# Patient Record
Sex: Female | Born: 1945 | Race: White | Hispanic: No | Smoking: Former smoker
Health system: Southern US, Community
[De-identification: ages and names within clinical notes are randomized; demographics above are authoritative.]

## PROBLEM LIST (undated history)

## (undated) DIAGNOSIS — F419 Anxiety disorder, unspecified: Secondary | ICD-10-CM

## (undated) DIAGNOSIS — R0902 Hypoxemia: Secondary | ICD-10-CM

## (undated) DIAGNOSIS — E119 Type 2 diabetes mellitus without complications: Secondary | ICD-10-CM

## (undated) DIAGNOSIS — J449 Chronic obstructive pulmonary disease, unspecified: Secondary | ICD-10-CM

## (undated) DIAGNOSIS — G4730 Sleep apnea, unspecified: Secondary | ICD-10-CM

## (undated) DIAGNOSIS — H269 Unspecified cataract: Secondary | ICD-10-CM

## (undated) DIAGNOSIS — F329 Major depressive disorder, single episode, unspecified: Secondary | ICD-10-CM

## (undated) DIAGNOSIS — I483 Typical atrial flutter: Secondary | ICD-10-CM

## (undated) DIAGNOSIS — I4891 Unspecified atrial fibrillation: Secondary | ICD-10-CM

## (undated) DIAGNOSIS — F32A Depression, unspecified: Secondary | ICD-10-CM

## (undated) DIAGNOSIS — M1990 Unspecified osteoarthritis, unspecified site: Secondary | ICD-10-CM

## (undated) DIAGNOSIS — I10 Essential (primary) hypertension: Secondary | ICD-10-CM

## (undated) DIAGNOSIS — E785 Hyperlipidemia, unspecified: Secondary | ICD-10-CM

## (undated) DIAGNOSIS — D649 Anemia, unspecified: Secondary | ICD-10-CM

## (undated) DIAGNOSIS — J439 Emphysema, unspecified: Secondary | ICD-10-CM

## (undated) DIAGNOSIS — T7840XA Allergy, unspecified, initial encounter: Secondary | ICD-10-CM

## (undated) HISTORY — DX: Anxiety disorder, unspecified: F41.9

## (undated) HISTORY — DX: Major depressive disorder, single episode, unspecified: F32.9

## (undated) HISTORY — DX: Typical atrial flutter: I48.3

## (undated) HISTORY — DX: Hypoxemia: R09.02

## (undated) HISTORY — PX: DILATION AND CURETTAGE OF UTERUS: SHX78

## (undated) HISTORY — PX: FOOT NEUROMA SURGERY: SHX646

## (undated) HISTORY — DX: Hyperlipidemia, unspecified: E78.5

## (undated) HISTORY — PX: CHOLECYSTECTOMY: SHX55

## (undated) HISTORY — PX: CARPAL TUNNEL RELEASE: SHX101

## (undated) HISTORY — DX: Chronic obstructive pulmonary disease, unspecified: J44.9

## (undated) HISTORY — DX: Emphysema, unspecified: J43.9

## (undated) HISTORY — PX: ABDOMINAL HYSTERECTOMY: SHX81

## (undated) HISTORY — DX: Unspecified osteoarthritis, unspecified site: M19.90

## (undated) HISTORY — PX: TONSILLECTOMY: SUR1361

## (undated) HISTORY — DX: Allergy, unspecified, initial encounter: T78.40XA

## (undated) HISTORY — PX: MOUTH SURGERY: SHX715

## (undated) HISTORY — DX: Unspecified cataract: H26.9

## (undated) HISTORY — DX: Anemia, unspecified: D64.9

## (undated) HISTORY — DX: Sleep apnea, unspecified: G47.30

## (undated) HISTORY — DX: Unspecified atrial fibrillation: I48.91

## (undated) HISTORY — DX: Depression, unspecified: F32.A

---

## 1988-09-25 HISTORY — PX: OTHER SURGICAL HISTORY: SHX169

## 2008-10-16 ENCOUNTER — Encounter: Admission: RE | Admit: 2008-10-16 | Discharge: 2008-10-16 | Payer: Self-pay | Admitting: Family Medicine

## 2012-03-06 DIAGNOSIS — R0602 Shortness of breath: Secondary | ICD-10-CM

## 2012-03-26 ENCOUNTER — Other Ambulatory Visit: Payer: Self-pay | Admitting: Family Medicine

## 2012-03-26 DIAGNOSIS — R921 Mammographic calcification found on diagnostic imaging of breast: Secondary | ICD-10-CM

## 2012-04-05 ENCOUNTER — Ambulatory Visit
Admission: RE | Admit: 2012-04-05 | Discharge: 2012-04-05 | Disposition: A | Discharge status: Home / Self Care | Source: Ambulatory Visit | Attending: Family Medicine | Admitting: Family Medicine

## 2012-04-05 DIAGNOSIS — R921 Mammographic calcification found on diagnostic imaging of breast: Secondary | ICD-10-CM

## 2013-06-09 ENCOUNTER — Encounter (HOSPITAL_COMMUNITY): Payer: Self-pay

## 2013-06-09 ENCOUNTER — Encounter (HOSPITAL_COMMUNITY)
Admission: RE | Admit: 2013-06-09 | Discharge: 2013-06-09 | Disposition: A | Discharge status: Home / Self Care | Payer: Medicare Other | Source: Ambulatory Visit | Attending: Ophthalmology | Admitting: Ophthalmology

## 2013-06-09 ENCOUNTER — Encounter (HOSPITAL_COMMUNITY): Payer: Self-pay | Admitting: Pharmacy Technician

## 2013-06-09 ENCOUNTER — Other Ambulatory Visit: Payer: Self-pay

## 2013-06-09 DIAGNOSIS — Z01812 Encounter for preprocedural laboratory examination: Secondary | ICD-10-CM | POA: Insufficient documentation

## 2013-06-09 DIAGNOSIS — Z01818 Encounter for other preprocedural examination: Secondary | ICD-10-CM | POA: Insufficient documentation

## 2013-06-09 DIAGNOSIS — Z01810 Encounter for preprocedural cardiovascular examination: Secondary | ICD-10-CM | POA: Insufficient documentation

## 2013-06-09 HISTORY — DX: Type 2 diabetes mellitus without complications: E11.9

## 2013-06-09 HISTORY — DX: Essential (primary) hypertension: I10

## 2013-06-09 LAB — HEMOGLOBIN AND HEMATOCRIT, BLOOD: Hemoglobin: 13.6 g/dL (ref 12.0–15.0)

## 2013-06-09 LAB — BASIC METABOLIC PANEL
BUN: 16 mg/dL (ref 6–23)
Chloride: 100 mEq/L (ref 96–112)
GFR calc Af Amer: 70 mL/min — ABNORMAL LOW (ref >90)
Potassium: 4.3 mEq/L (ref 3.5–5.1)

## 2013-06-09 NOTE — Patient Instructions (Addendum)
Your procedure is scheduled on:  06/16/2013  Report to [facility] at    11:00  AM.  Call this number if you have problems the morning of surgery: [phone]   Remember:   Do not eat or drink :After Midnight.    Take these medicines the morning of surgery with A SIP OF WATER: Benicar    Do not wear jewelry, make-up or nail polish.  Do not wear lotions, powders, or perfumes. You may wear deodorant.  Do not shave 48 hours prior to surgery.  Do not bring valuables to the hospital.  Contacts, dentures or bridgework may not be worn into surgery.  Patients discharged the day of surgery will not be allowed to drive home.  Name and phone number of your driver:    Please read over the following fact sheets that you were given: Pain Booklet, Surgical Site Infection Prevention, Anesthesia Post-op Instructions and Care and Recovery After Surgery  Cataract Surgery  A cataract is a clouding of the lens of the eye. When a lens becomes cloudy, vision is reduced based on the degree and nature of the clouding. Surgery may be needed to improve vision. Surgery removes the cloudy lens and usually replaces it with a substitute lens (intraocular lens, IOL). LET YOUR EYE DOCTOR KNOW ABOUT:  Allergies to food or medicine.   Medicines taken including herbs, eyedrops, over-the-counter medicines, and creams.   Use of steroids (by mouth or creams).   Previous problems with anesthetics or numbing medicine.   History of bleeding problems or blood clots.   Previous surgery.   Other health problems, including diabetes and kidney problems.   Possibility of pregnancy, if this applies.  RISKS AND COMPLICATIONS  Infection.   Inflammation of the eyeball (endophthalmitis) that can spread to both eyes (sympathetic ophthalmia).   Poor wound healing.   If an IOL is inserted, it can later fall out of proper position. This is very uncommon.   Clouding of the part of your eye that holds an IOL in place. This is  called an "after-cataract." These are uncommon, but easily treated.  BEFORE THE PROCEDURE  Do not eat or drink anything except small amounts of water for 8 to 12 before your surgery, or as directed by your caregiver.   Unless you are told otherwise, continue any eyedrops you have been prescribed.   Talk to your primary caregiver about all other medicines that you take (both prescription and non-prescription). In some cases, you may need to stop or change medicines near the time of your surgery. This is most important if you are taking blood-thinning medicine.Do not stop medicines unless you are told to do so.   Arrange for someone to drive you to and from the procedure.   Do not put contact lenses in either eye on the day of your surgery.  PROCEDURE There is more than one method for safely removing a cataract. Your doctor can explain the differences and help determine which is best for you. Phacoemulsification surgery is the most common form of cataract surgery.  An injection is given behind the eye or eyedrops are given to make this a painless procedure.   A small cut (incision) is made on the edge of the clear, dome-shaped surface that covers the front of the eye (cornea).   A tiny probe is painlessly inserted into the eye. This device gives off ultrasound waves that soften and break up the cloudy center of the lens. This makes it easier  for the cloudy lens to be removed by suction.   An IOL may be implanted.   The normal lens of the eye is covered by a clear capsule. Part of that capsule is intentionally left in the eye to support the IOL.   Your surgeon may or may not use stitches to close the incision.  There are other forms of cataract surgery that require a larger incision and stiches to close the eye. This approach is taken in cases where the doctor feels that the cataract cannot be easily removed using phacoemulsification. AFTER THE PROCEDURE  When an IOL is implanted, it  does not need care. It becomes a permanent part of your eye and cannot be seen or felt.   Your doctor will schedule follow-up exams to check on your progress.   Review your other medicines with your doctor to see which can be resumed after surgery.   Use eyedrops or take medicine as prescribed by your doctor.  Document Released: 08/31/2011 Document Reviewed: 08/28/2011 [facility] Patient Information 2012 [place].  .Cataract Surgery Care After Refer to this sheet in the next few weeks. These instructions provide you with information on caring for yourself after your procedure. Your caregiver may also give you more specific instructions. Your treatment has been planned according to current medical practices, but problems sometimes occur. Call your caregiver if you have any problems or questions after your procedure.  HOME CARE INSTRUCTIONS   Avoid strenuous activities as directed by your caregiver.   Ask your caregiver when you can resume driving.   Use eyedrops or other medicines to help healing and control pressure inside your eye as directed by your caregiver.   Only take over-the-counter or prescription medicines for pain, discomfort, or fever as directed by your caregiver.   Do not to touch or rub your eyes.   You may be instructed to use a protective shield during the first few days and nights after surgery. If not, wear sunglasses to protect your eyes. This is to protect the eye from pressure or from being accidentally bumped.   Keep the area around your eye clean and dry. Avoid swimming or allowing water to hit you directly in the face while showering. Keep soap and shampoo out of your eyes.   Do not bend or lift heavy objects. Bending increases pressure in the eye. You can walk, climb stairs, and do light household chores.   Do not put a contact lens into the eye that had surgery until your caregiver says it is okay to do so.   Ask your doctor when you can return to  work. This will depend on the kind of work that you do. If you work in a dusty environment, you may be advised to wear protective eyewear for a period of time.   Ask your caregiver when it will be safe to engage in sexual activity.   Continue with your regular eye exams as directed by your caregiver.  What to expect:  It is normal to feel itching and mild discomfort for a few days after cataract surgery. Some fluid discharge is also common, and your eye may be sensitive to light and touch.   After 1 to 2 days, even moderate discomfort should disappear. In most cases, healing will take about 6 weeks.   If you received an intraocular lens (IOL), you may notice that colors are very bright or have a blue tinge. Also, if you have been in bright sunlight, everything may  appear reddish for a few hours. If you see these color tinges, it is because your lens is clear and no longer cloudy. Within a few months after receiving an IOL, these extra colors should go away. When you have healed, you will probably need new glasses.  SEEK MEDICAL CARE IF:   You have increased bruising around your eye.   You have discomfort not helped by medicine.  SEEK IMMEDIATE MEDICAL CARE IF:   You have a fever.   You have a worsening or sudden vision loss.   You have redness, swelling, or increasing pain in the eye.   You have a thick discharge from the eye that had surgery.  MAKE SURE YOU:  Understand these instructions.   Will watch your condition.   Will get help right away if you are not doing well or get worse.  Document Released: 03/31/2005 Document Revised: 08/31/2011 Document Reviewed: 05/05/2011 [facility] Patient Information 2012 [place].

## 2013-06-13 MED ORDER — TETRACAINE HCL 0.5 % OP SOLN
OPHTHALMIC | Status: AC
  Filled 2013-06-13: qty 2

## 2013-06-13 MED ORDER — CYCLOPENTOLATE-PHENYLEPHRINE OP SOLN OPTIME - NO CHARGE
OPHTHALMIC | Status: AC
  Filled 2013-06-13: qty 2

## 2013-06-13 MED ORDER — LIDOCAINE HCL 3.5 % OP GEL
OPHTHALMIC | Status: AC
  Filled 2013-06-13: qty 5

## 2013-06-13 MED ORDER — NEOMYCIN-POLYMYXIN-DEXAMETH 3.5-10000-0.1 OP OINT
TOPICAL_OINTMENT | OPHTHALMIC | Status: AC
  Filled 2013-06-13: qty 3.5

## 2013-06-16 ENCOUNTER — Ambulatory Visit (HOSPITAL_COMMUNITY): Payer: Medicare Other | Admitting: Anesthesiology

## 2013-06-16 ENCOUNTER — Encounter (HOSPITAL_COMMUNITY): Payer: Self-pay | Admitting: *Deleted

## 2013-06-16 ENCOUNTER — Encounter (HOSPITAL_COMMUNITY): Payer: Self-pay | Admitting: Anesthesiology

## 2013-06-16 ENCOUNTER — Encounter (HOSPITAL_COMMUNITY)
Admission: RE | Disposition: A | Discharge status: Home / Self Care | Payer: Self-pay | Source: Ambulatory Visit | Attending: Ophthalmology

## 2013-06-16 ENCOUNTER — Ambulatory Visit (HOSPITAL_COMMUNITY)
Admission: RE | Admit: 2013-06-16 | Discharge: 2013-06-16 | Disposition: A | Discharge status: Home / Self Care | Payer: Medicare Other | Source: Ambulatory Visit | Attending: Ophthalmology | Admitting: Ophthalmology

## 2013-06-16 DIAGNOSIS — I10 Essential (primary) hypertension: Secondary | ICD-10-CM | POA: Insufficient documentation

## 2013-06-16 DIAGNOSIS — E119 Type 2 diabetes mellitus without complications: Secondary | ICD-10-CM | POA: Insufficient documentation

## 2013-06-16 DIAGNOSIS — H2510 Age-related nuclear cataract, unspecified eye: Secondary | ICD-10-CM | POA: Insufficient documentation

## 2013-06-16 DIAGNOSIS — Z01812 Encounter for preprocedural laboratory examination: Secondary | ICD-10-CM | POA: Insufficient documentation

## 2013-06-16 HISTORY — PX: CATARACT EXTRACTION W/PHACO: SHX586

## 2013-06-16 LAB — GLUCOSE, CAPILLARY: Glucose-Capillary: 89 mg/dL (ref 70–99)

## 2013-06-16 SURGERY — PHACOEMULSIFICATION, CATARACT, WITH IOL INSERTION
Anesthesia: Monitor Anesthesia Care | Site: Eye | Laterality: Left | Wound class: Clean

## 2013-06-16 MED ORDER — FENTANYL CITRATE 0.05 MG/ML IJ SOLN
INTRAMUSCULAR | Status: AC
  Filled 2013-06-16: qty 2

## 2013-06-16 MED ORDER — MIDAZOLAM HCL 2 MG/2ML IJ SOLN
1.0000 mg | INTRAMUSCULAR | Status: DC | PRN
  Administered 2013-06-16: 2 mg via INTRAVENOUS

## 2013-06-16 MED ORDER — MIDAZOLAM HCL 2 MG/2ML IJ SOLN
INTRAMUSCULAR | Status: AC
  Filled 2013-06-16: qty 2

## 2013-06-16 MED ORDER — LIDOCAINE HCL (PF) 1 % IJ SOLN
INTRAMUSCULAR | Status: DC | PRN
  Administered 2013-06-16: .7 mL

## 2013-06-16 MED ORDER — LIDOCAINE 3.5 % OP GEL OPTIME - NO CHARGE
OPHTHALMIC | Status: DC | PRN
  Administered 2013-06-16: 2 [drp] via OPHTHALMIC

## 2013-06-16 MED ORDER — FENTANYL CITRATE 0.05 MG/ML IJ SOLN: 25.0000 ug | INTRAMUSCULAR | Status: DC | PRN

## 2013-06-16 MED ORDER — PROVISC 10 MG/ML IO SOLN
INTRAOCULAR | Status: DC | PRN
  Administered 2013-06-16: 8.5 mg via INTRAOCULAR

## 2013-06-16 MED ORDER — FENTANYL CITRATE 0.05 MG/ML IJ SOLN
25.0000 ug | INTRAMUSCULAR | Status: AC
  Administered 2013-06-16: 25 ug via INTRAVENOUS

## 2013-06-16 MED ORDER — ONDANSETRON HCL 4 MG/2ML IJ SOLN: 4.0000 mg | Freq: Once | INTRAMUSCULAR | Status: AC | PRN

## 2013-06-16 MED ORDER — BSS IO SOLN
INTRAOCULAR | Status: DC | PRN
  Administered 2013-06-16: 15 mL via INTRAOCULAR

## 2013-06-16 MED ORDER — LIDOCAINE HCL 3.5 % OP GEL
1.0000 "application " | Freq: Once | OPHTHALMIC | Status: AC
  Administered 2013-06-16: 1 via OPHTHALMIC

## 2013-06-16 MED ORDER — POVIDONE-IODINE 5 % OP SOLN
OPHTHALMIC | Status: DC | PRN
  Administered 2013-06-16: 1 via OPHTHALMIC

## 2013-06-16 MED ORDER — NEOMYCIN-POLYMYXIN-DEXAMETH 0.1 % OP OINT
TOPICAL_OINTMENT | OPHTHALMIC | Status: DC | PRN
  Administered 2013-06-16: 1 via OPHTHALMIC

## 2013-06-16 MED ORDER — TETRACAINE HCL 0.5 % OP SOLN
1.0000 [drp] | OPHTHALMIC | Status: AC
  Administered 2013-06-16 (×3): 1 [drp] via OPHTHALMIC

## 2013-06-16 MED ORDER — EPINEPHRINE HCL 1 MG/ML IJ SOLN
INTRAOCULAR | Status: DC | PRN
  Administered 2013-06-16: 13:00:00

## 2013-06-16 MED ORDER — CYCLOPENTOLATE-PHENYLEPHRINE 0.2-1 % OP SOLN
1.0000 [drp] | OPHTHALMIC | Status: AC
  Administered 2013-06-16 (×3): 1 [drp] via OPHTHALMIC

## 2013-06-16 MED ORDER — LACTATED RINGERS IV SOLN
INTRAVENOUS | Status: DC | PRN
  Administered 2013-06-16: 13:00:00 via INTRAVENOUS

## 2013-06-16 MED ORDER — LACTATED RINGERS IV SOLN
INTRAVENOUS | Status: DC
  Administered 2013-06-16: 1000 mL via INTRAVENOUS

## 2013-06-16 MED ORDER — PHENYLEPHRINE HCL 2.5 % OP SOLN
1.0000 [drp] | OPHTHALMIC | Status: AC
  Administered 2013-06-16 (×3): 1 [drp] via OPHTHALMIC

## 2013-06-16 MED ORDER — EPINEPHRINE HCL 1 MG/ML IJ SOLN
INTRAMUSCULAR | Status: AC
  Filled 2013-06-16: qty 1

## 2013-06-16 SURGICAL SUPPLY — 33 items
CAPSULAR TENSION RING-AMO (OPHTHALMIC RELATED) IMPLANT
CLOTH BEACON ORANGE TIMEOUT ST (SAFETY) ×2 IMPLANT
EYE SHIELD UNIVERSAL CLEAR (GAUZE/BANDAGES/DRESSINGS) ×2 IMPLANT
GLOVE BIO SURGEON STRL SZ 6.5 (GLOVE) IMPLANT
GLOVE BIOGEL PI IND STRL 6.5 (GLOVE) ×1 IMPLANT
GLOVE BIOGEL PI IND STRL 7.0 (GLOVE) IMPLANT
GLOVE BIOGEL PI IND STRL 7.5 (GLOVE) IMPLANT
GLOVE BIOGEL PI INDICATOR 6.5 (GLOVE) ×1
GLOVE BIOGEL PI INDICATOR 7.0 (GLOVE)
GLOVE BIOGEL PI INDICATOR 7.5 (GLOVE)
GLOVE ECLIPSE 6.5 STRL STRAW (GLOVE) IMPLANT
GLOVE ECLIPSE 7.0 STRL STRAW (GLOVE) IMPLANT
GLOVE ECLIPSE 7.5 STRL STRAW (GLOVE) IMPLANT
GLOVE EXAM NITRILE LRG STRL (GLOVE) ×2 IMPLANT
GLOVE EXAM NITRILE MD LF STRL (GLOVE) IMPLANT
GLOVE SKINSENSE NS SZ6.5 (GLOVE)
GLOVE SKINSENSE NS SZ7.0 (GLOVE)
GLOVE SKINSENSE STRL SZ6.5 (GLOVE) IMPLANT
GLOVE SKINSENSE STRL SZ7.0 (GLOVE) IMPLANT
KIT VITRECTOMY (OPHTHALMIC RELATED) IMPLANT
PAD ARMBOARD 7.5X6 YLW CONV (MISCELLANEOUS) ×2 IMPLANT
PROC W NO LENS (INTRAOCULAR LENS)
PROC W SPEC LENS (INTRAOCULAR LENS)
PROCESS W NO LENS (INTRAOCULAR LENS) IMPLANT
PROCESS W SPEC LENS (INTRAOCULAR LENS) IMPLANT
RING MALYGIN (MISCELLANEOUS) IMPLANT
SIGHTPATH CAT PROC W REG LENS (Ophthalmic Related) ×2 IMPLANT
SYR TB 1ML LL NO SAFETY (SYRINGE) ×2 IMPLANT
TAPE PAPER MEDFIX 1IN X 10YD (GAUZE/BANDAGES/DRESSINGS) ×2 IMPLANT
TAPE SURG TRANSPORE 1 IN (GAUZE/BANDAGES/DRESSINGS) ×1 IMPLANT
TAPE SURGICAL TRANSPORE 1 IN (GAUZE/BANDAGES/DRESSINGS) ×1
VISCOELASTIC ADDITIONAL (OPHTHALMIC RELATED) IMPLANT
WATER STERILE IRR 250ML POUR (IV SOLUTION) ×2 IMPLANT

## 2013-06-16 NOTE — H&P (Signed)
I have reviewed the H&P, the patient was re-examined, and I have identified no interval changes in medical condition and plan of care since the history and physical of record  

## 2013-06-16 NOTE — Anesthesia Procedure Notes (Signed)
Procedure Name: MAC Date/Time: 06/16/2013 1:19 PM Performed by: Antony Contras, AMY L Pre-anesthesia Checklist: Patient identified, Timeout performed, Emergency Drugs available, Suction available and Patient being monitored Oxygen Delivery Method: Nasal cannula

## 2013-06-16 NOTE — Op Note (Signed)
Date of Admission: 06/16/2013  Date of Surgery: 06/16/2013  Pre-Op Dx: Cataract  Left  Eye  Post-Op Dx: Nuclear Cataract  Left  Eye,  Dx Code 366.16  Surgeon: Tonny Branch, M.D.  Assistants: None  Anesthesia: Topical with MAC  Indications: Painless, progressive loss of vision with compromise of daily activities.  Surgery: Cataract Extraction with Intraocular lens Implant Left Eye  Discription: The patient had dilating drops and viscous lidocaine placed into the left eye in the pre-op holding area. After transfer to the operating room, a time out was performed. The patient was then prepped and draped. Beginning with a 50 degree blade a paracentesis port was made at the surgeon's 2 o'clock position. The anterior chamber was then filled with 1% non-preserved lidocaine. This was followed by filling the anterior chamber with Provisc. A 2.38mm keratome blade was used to make a clear corneal incision at the temporal limbus. A bent cystatome needle was used to create a continuous tear capsulotomy. Hydrodissection was performed with balanced salt solution on a Fine canula. The lens nucleus was then removed using the phacoemulsification handpiece. Residual cortex was removed with the I&A handpiece. The anterior chamber and capsular bag were refilled with Provisc. A posterior chamber intraocular lens was placed into the capsular bag with it's injector. The implant was positioned with the Kuglan hook. The Provisc was then removed from the anterior chamber and capsular bag with the I&A handpiece. Stromal hydration of the main incision and paracentesis port was performed with BSS on a Fine canula. The wounds were tested for leak which was negative. The patient tolerated the procedure well. There were no operative complications. The patient was then transferred to the recovery room in stable condition.  Complications: None  Specimen: None  EBL: None  Prosthetic device: B&L enVista, MX60, power 22.0D, SN  [id number].

## 2013-06-16 NOTE — Anesthesia Postprocedure Evaluation (Signed)
  Anesthesia Post-op Note  Patient: Linda [name]  Procedure(s) Performed: Procedure(s) with comments: CATARACT EXTRACTION PHACO AND INTRAOCULAR LENS PLACEMENT (IOC) (Left) - CDE:  12.18  Patient Location: Short Stay  Anesthesia Type:MAC  Level of Consciousness: awake, alert , oriented and patient cooperative  Airway and Oxygen Therapy: Patient Spontanous Breathing  Post-op Pain: none  Post-op Assessment: Post-op Vital signs reviewed, Patient's Cardiovascular Status Stable, Respiratory Function Stable, Patent Airway, No signs of Nausea or vomiting and Pain level controlled  Post-op Vital Signs: Reviewed and stable  Complications: No apparent anesthesia complications

## 2013-06-16 NOTE — Anesthesia Preprocedure Evaluation (Addendum)
Anesthesia Evaluation  Patient identified by MRN, date of birth, ID band Patient awake    Reviewed: Allergy & Precautions, H&P , NPO status , Patient's Chart, lab work & pertinent test results  Airway Mallampati: II      Dental  (+) Teeth Intact   Pulmonary  breath sounds clear to auscultation        Cardiovascular hypertension, Pt. on medications Rhythm:Regular Rate:Normal     Neuro/Psych    GI/Hepatic   Endo/Other  diabetes, Type 2, Oral Hypoglycemic Agents  Renal/GU      Musculoskeletal   Abdominal   Peds  Hematology   Anesthesia Other Findings   Reproductive/Obstetrics                           Anesthesia Physical Anesthesia Plan  ASA: III  Anesthesia Plan: MAC   Post-op Pain Management:    Induction: Intravenous  Airway Management Planned: Nasal Cannula  Additional Equipment:   Intra-op Plan:   Post-operative Plan:   Informed Consent: I have reviewed the patients History and Physical, chart, labs and discussed the procedure including the risks, benefits and alternatives for the proposed anesthesia with the patient or authorized representative who has indicated his/her understanding and acceptance.     Plan Discussed with:   Anesthesia Plan Comments:         Anesthesia Quick Evaluation

## 2013-06-16 NOTE — Preoperative (Signed)
Beta Blockers   Reason not to administer Beta Blockers:Not Applicable 

## 2013-06-16 NOTE — Transfer of Care (Signed)
Immediate Anesthesia Transfer of Care Note  Patient: Linda [name]  Procedure(s) Performed: Procedure(s) with comments: CATARACT EXTRACTION PHACO AND INTRAOCULAR LENS PLACEMENT (IOC) (Left) - CDE:  12.18  Patient Location: Short Stay  Anesthesia Type:MAC  Level of Consciousness: awake, alert , oriented and patient cooperative  Airway & Oxygen Therapy: Patient Spontanous Breathing  Post-op Assessment: Report given to PACU RN and Post -op Vital signs reviewed and stable  Post vital signs: Reviewed and stable  Complications: No apparent anesthesia complications

## 2013-06-17 ENCOUNTER — Encounter (HOSPITAL_COMMUNITY): Payer: Self-pay | Admitting: Ophthalmology

## 2013-06-24 ENCOUNTER — Encounter (HOSPITAL_COMMUNITY): Payer: Self-pay | Admitting: Pharmacy Technician

## 2013-06-25 ENCOUNTER — Encounter (HOSPITAL_COMMUNITY)
Admission: RE | Admit: 2013-06-25 | Discharge: 2013-06-25 | Disposition: A | Discharge status: Home / Self Care | Payer: Medicare Other | Source: Ambulatory Visit | Attending: Ophthalmology | Admitting: Ophthalmology

## 2013-06-25 MED ORDER — LIDOCAINE HCL (PF) 1 % IJ SOLN
INTRAMUSCULAR | Status: AC
  Filled 2013-06-25: qty 2

## 2013-06-25 MED ORDER — LIDOCAINE HCL 3.5 % OP GEL
OPHTHALMIC | Status: AC
  Filled 2013-06-25: qty 1

## 2013-06-25 MED ORDER — NEOMYCIN-POLYMYXIN-DEXAMETH 3.5-10000-0.1 OP SUSP
OPHTHALMIC | Status: AC
  Filled 2013-06-25: qty 5

## 2013-06-25 MED ORDER — CYCLOPENTOLATE-PHENYLEPHRINE OP SOLN OPTIME - NO CHARGE
OPHTHALMIC | Status: AC
  Filled 2013-06-25: qty 2

## 2013-06-25 MED ORDER — TETRACAINE HCL 0.5 % OP SOLN
OPHTHALMIC | Status: AC
  Filled 2013-06-25: qty 2

## 2013-06-26 ENCOUNTER — Ambulatory Visit (HOSPITAL_COMMUNITY): Payer: Medicare Other | Admitting: Anesthesiology

## 2013-06-26 ENCOUNTER — Ambulatory Visit (HOSPITAL_COMMUNITY)
Admission: RE | Admit: 2013-06-26 | Discharge: 2013-06-26 | Disposition: A | Discharge status: Home / Self Care | Payer: Medicare Other | Source: Ambulatory Visit | Attending: Ophthalmology | Admitting: Ophthalmology

## 2013-06-26 ENCOUNTER — Encounter (HOSPITAL_COMMUNITY)
Admission: RE | Disposition: A | Discharge status: Home / Self Care | Payer: Self-pay | Source: Ambulatory Visit | Attending: Ophthalmology

## 2013-06-26 ENCOUNTER — Encounter (HOSPITAL_COMMUNITY): Payer: Self-pay | Admitting: *Deleted

## 2013-06-26 ENCOUNTER — Encounter (HOSPITAL_COMMUNITY): Payer: Self-pay | Admitting: Anesthesiology

## 2013-06-26 DIAGNOSIS — Z01812 Encounter for preprocedural laboratory examination: Secondary | ICD-10-CM | POA: Insufficient documentation

## 2013-06-26 DIAGNOSIS — I10 Essential (primary) hypertension: Secondary | ICD-10-CM | POA: Insufficient documentation

## 2013-06-26 DIAGNOSIS — H2510 Age-related nuclear cataract, unspecified eye: Secondary | ICD-10-CM | POA: Insufficient documentation

## 2013-06-26 DIAGNOSIS — E119 Type 2 diabetes mellitus without complications: Secondary | ICD-10-CM | POA: Insufficient documentation

## 2013-06-26 HISTORY — PX: CATARACT EXTRACTION W/PHACO: SHX586

## 2013-06-26 LAB — GLUCOSE, CAPILLARY: Glucose-Capillary: 99 mg/dL (ref 70–99)

## 2013-06-26 SURGERY — PHACOEMULSIFICATION, CATARACT, WITH IOL INSERTION
Anesthesia: Monitor Anesthesia Care | Site: Eye | Laterality: Right | Wound class: Clean

## 2013-06-26 MED ORDER — MIDAZOLAM HCL 2 MG/2ML IJ SOLN
INTRAMUSCULAR | Status: AC
  Filled 2013-06-26: qty 2

## 2013-06-26 MED ORDER — FENTANYL CITRATE 0.05 MG/ML IJ SOLN
25.0000 ug | INTRAMUSCULAR | Status: AC
  Administered 2013-06-26: 25 ug via INTRAVENOUS

## 2013-06-26 MED ORDER — EPINEPHRINE HCL 1 MG/ML IJ SOLN
INTRAMUSCULAR | Status: AC
  Filled 2013-06-26: qty 1

## 2013-06-26 MED ORDER — LIDOCAINE HCL 3.5 % OP GEL: 1.0000 "application " | Freq: Once | OPHTHALMIC | Status: DC

## 2013-06-26 MED ORDER — PROVISC 10 MG/ML IO SOLN
INTRAOCULAR | Status: DC | PRN
  Administered 2013-06-26: 8.5 mg via INTRAOCULAR

## 2013-06-26 MED ORDER — BSS IO SOLN
INTRAOCULAR | Status: DC | PRN
  Administered 2013-06-26: 15 mL via INTRAOCULAR

## 2013-06-26 MED ORDER — LACTATED RINGERS IV SOLN
INTRAVENOUS | Status: DC
  Administered 2013-06-26: 1000 mL via INTRAVENOUS

## 2013-06-26 MED ORDER — TETRACAINE HCL 0.5 % OP SOLN
1.0000 [drp] | OPHTHALMIC | Status: AC
  Administered 2013-06-26 (×3): 1 [drp] via OPHTHALMIC

## 2013-06-26 MED ORDER — CYCLOPENTOLATE-PHENYLEPHRINE 0.2-1 % OP SOLN
1.0000 [drp] | OPHTHALMIC | Status: AC
  Administered 2013-06-26 (×3): 1 [drp] via OPHTHALMIC

## 2013-06-26 MED ORDER — LIDOCAINE HCL (PF) 1 % IJ SOLN
INTRAMUSCULAR | Status: DC | PRN
  Administered 2013-06-26: .4 mL

## 2013-06-26 MED ORDER — POVIDONE-IODINE 5 % OP SOLN
OPHTHALMIC | Status: DC | PRN
  Administered 2013-06-26: 1 via OPHTHALMIC

## 2013-06-26 MED ORDER — FENTANYL CITRATE 0.05 MG/ML IJ SOLN
INTRAMUSCULAR | Status: AC
  Filled 2013-06-26: qty 2

## 2013-06-26 MED ORDER — LIDOCAINE 3.5 % OP GEL OPTIME - NO CHARGE
OPHTHALMIC | Status: DC | PRN
  Administered 2013-06-26: 1 [drp] via OPHTHALMIC

## 2013-06-26 MED ORDER — MIDAZOLAM HCL 2 MG/2ML IJ SOLN
1.0000 mg | INTRAMUSCULAR | Status: DC | PRN
  Administered 2013-06-26: 2 mg via INTRAVENOUS

## 2013-06-26 MED ORDER — PHENYLEPHRINE HCL 2.5 % OP SOLN
OPHTHALMIC | Status: AC
  Filled 2013-06-26: qty 15

## 2013-06-26 MED ORDER — LACTATED RINGERS IV SOLN
INTRAVENOUS | Status: DC | PRN
  Administered 2013-06-26: 10:00:00 via INTRAVENOUS

## 2013-06-26 MED ORDER — EPINEPHRINE HCL 1 MG/ML IJ SOLN
INTRAOCULAR | Status: DC | PRN
  Administered 2013-06-26: 10:00:00

## 2013-06-26 MED ORDER — NEOMYCIN-POLYMYXIN-DEXAMETH 0.1 % OP OINT
TOPICAL_OINTMENT | OPHTHALMIC | Status: DC | PRN
  Administered 2013-06-26: 1 via OPHTHALMIC

## 2013-06-26 MED ORDER — PHENYLEPHRINE HCL 2.5 % OP SOLN
1.0000 [drp] | OPHTHALMIC | Status: AC
  Administered 2013-06-26 (×3): 1 [drp] via OPHTHALMIC

## 2013-06-26 SURGICAL SUPPLY — 31 items
CAPSULAR TENSION RING-AMO (OPHTHALMIC RELATED) IMPLANT
CLOTH BEACON ORANGE TIMEOUT ST (SAFETY) ×2 IMPLANT
EYE SHIELD UNIVERSAL CLEAR (GAUZE/BANDAGES/DRESSINGS) ×2 IMPLANT
GLOVE BIO SURGEON STRL SZ 6.5 (GLOVE) IMPLANT
GLOVE BIOGEL PI IND STRL 6.5 (GLOVE) ×1 IMPLANT
GLOVE BIOGEL PI IND STRL 7.0 (GLOVE) IMPLANT
GLOVE BIOGEL PI IND STRL 7.5 (GLOVE) IMPLANT
GLOVE BIOGEL PI INDICATOR 6.5 (GLOVE) ×1
GLOVE BIOGEL PI INDICATOR 7.0 (GLOVE)
GLOVE BIOGEL PI INDICATOR 7.5 (GLOVE)
GLOVE ECLIPSE 6.5 STRL STRAW (GLOVE) IMPLANT
GLOVE ECLIPSE 7.0 STRL STRAW (GLOVE) IMPLANT
GLOVE ECLIPSE 7.5 STRL STRAW (GLOVE) IMPLANT
GLOVE EXAM NITRILE LRG STRL (GLOVE) ×2 IMPLANT
GLOVE EXAM NITRILE MD LF STRL (GLOVE) IMPLANT
GLOVE SKINSENSE NS SZ6.5 (GLOVE)
GLOVE SKINSENSE NS SZ7.0 (GLOVE)
GLOVE SKINSENSE STRL SZ6.5 (GLOVE) IMPLANT
GLOVE SKINSENSE STRL SZ7.0 (GLOVE) IMPLANT
KIT VITRECTOMY (OPHTHALMIC RELATED) IMPLANT
PAD ARMBOARD 7.5X6 YLW CONV (MISCELLANEOUS) ×2 IMPLANT
PROC W NO LENS (INTRAOCULAR LENS)
PROC W SPEC LENS (INTRAOCULAR LENS)
PROCESS W NO LENS (INTRAOCULAR LENS) IMPLANT
PROCESS W SPEC LENS (INTRAOCULAR LENS) IMPLANT
RING MALYGIN (MISCELLANEOUS) IMPLANT
SIGHTPATH CAT PROC W REG LENS (Ophthalmic Related) ×2 IMPLANT
SYR TB 1ML LL NO SAFETY (SYRINGE) ×2 IMPLANT
TAPE PAPER 2X10 WHT MICROPORE (GAUZE/BANDAGES/DRESSINGS) ×2 IMPLANT
VISCOELASTIC ADDITIONAL (OPHTHALMIC RELATED) IMPLANT
WATER STERILE IRR 250ML POUR (IV SOLUTION) ×2 IMPLANT

## 2013-06-26 NOTE — Transfer of Care (Signed)
Immediate Anesthesia Transfer of Care Note  Patient: Linda [name]  Procedure(s) Performed: Procedure(s) with comments: CATARACT EXTRACTION PHACO AND INTRAOCULAR LENS PLACEMENT (IOC) (Right) - CDE:14.71  Patient Location: Short Stay  Anesthesia Type:MAC  Level of Consciousness: awake, alert , oriented and patient cooperative  Airway & Oxygen Therapy: Patient Spontanous Breathing  Post-op Assessment: Report given to PACU RN and Post -op Vital signs reviewed and stable  Post vital signs: Reviewed and stable  Complications: No apparent anesthesia complications

## 2013-06-26 NOTE — Anesthesia Postprocedure Evaluation (Signed)
  Anesthesia Post-op Note  Patient: Linda [name]  Procedure(s) Performed: Procedure(s) with comments: CATARACT EXTRACTION PHACO AND INTRAOCULAR LENS PLACEMENT (IOC) (Right) - CDE:14.71  Patient Location: Short Stay  Anesthesia Type:MAC  Level of Consciousness: awake, alert , oriented and patient cooperative  Airway and Oxygen Therapy: Patient Spontanous Breathing  Post-op Pain: none  Post-op Assessment: Post-op Vital signs reviewed, Patient's Cardiovascular Status Stable, Respiratory Function Stable, Patent Airway, No signs of Nausea or vomiting and Pain level controlled  Post-op Vital Signs: Reviewed and stable  Complications: No apparent anesthesia complications

## 2013-06-26 NOTE — Anesthesia Procedure Notes (Signed)
Procedure Name: MAC Date/Time: 06/26/2013 10:16 AM Performed by: Antony Contras, Mariah Gerstenberger L Pre-anesthesia Checklist: Patient identified, Timeout performed, Emergency Drugs available, Suction available and Patient being monitored Patient Re-evaluated:Patient Re-evaluated prior to inductionOxygen Delivery Method: Nasal cannula

## 2013-06-26 NOTE — Preoperative (Signed)
Beta Blockers   Reason not to administer Beta Blockers:Not Applicable 

## 2013-06-26 NOTE — H&P (Signed)
I have reviewed the H&P, the patient was re-examined, and I have identified no interval changes in medical condition and plan of care since the history and physical of record  

## 2013-06-26 NOTE — Op Note (Signed)
Date of Admission: 06/26/2013  Date of Surgery: 06/26/2013  Pre-Op Dx: Cataract  Right  Eye  Post-Op Dx: Nuclear Cataract  Right  Eye,  Dx Code 366.16  Surgeon: Tonny Branch, M.D.  Assistants: None  Anesthesia: Topical with MAC  Indications: Painless, progressive loss of vision with compromise of daily activities.  Surgery: Cataract Extraction with Intraocular lens Implant Right Eye  Discription: The patient had dilating drops and viscous lidocaine placed into the right eye in the pre-op holding area. After transfer to the operating room, a time out was performed. The patient was then prepped and draped. Beginning with a 24 degree blade a paracentesis port was made at the surgeon's 2 o'clock position. The anterior chamber was then filled with 1% non-preserved lidocaine. This was followed by filling the anterior chamber with Provisc.  A 2.71mm keratome blade was used to make a clear corneal incision at the temporal limbus.  A bent cystatome needle was used to create a continuous tear capsulotomy. Hydrodissection was performed with balanced salt solution on a Fine canula. The lens nucleus was then removed using the phacoemulsification handpiece. Residual cortex was removed with the I&A handpiece. The anterior chamber and capsular bag were refilled with Provisc. A posterior chamber intraocular lens was placed into the capsular bag with it's injector. The implant was positioned with the Kuglan hook. The Provisc was then removed from the anterior chamber and capsular bag with the I&A handpiece. Stromal hydration of the main incision and paracentesis port was performed with BSS on a Fine canula. The wounds were tested for leak which was negative. The patient tolerated the procedure well. There were no operative complications. The patient was then transferred to the recovery room in stable condition.  Complications: None  Specimen: None  EBL: None  Prosthetic device: B&L enVista, MX60, power 22.5D, SN  [id number].

## 2013-06-26 NOTE — Anesthesia Preprocedure Evaluation (Signed)
Anesthesia Evaluation  Patient identified by MRN, date of birth, ID band Patient awake    Reviewed: Allergy & Precautions, H&P , NPO status , Patient's Chart, lab work & pertinent test results  Airway Mallampati: II      Dental  (+) Teeth Intact   Pulmonary  breath sounds clear to auscultation        Cardiovascular hypertension, Pt. on medications Rhythm:Regular Rate:Normal     Neuro/Psych    GI/Hepatic   Endo/Other  diabetes, Type 2, Oral Hypoglycemic Agents  Renal/GU      Musculoskeletal   Abdominal   Peds  Hematology   Anesthesia Other Findings   Reproductive/Obstetrics                           Anesthesia Physical Anesthesia Plan  ASA: III  Anesthesia Plan: MAC   Post-op Pain Management:    Induction: Intravenous  Airway Management Planned: Nasal Cannula  Additional Equipment:   Intra-op Plan:   Post-operative Plan:   Informed Consent: I have reviewed the patients History and Physical, chart, labs and discussed the procedure including the risks, benefits and alternatives for the proposed anesthesia with the patient or authorized representative who has indicated his/her understanding and acceptance.     Plan Discussed with:   Anesthesia Plan Comments:         Anesthesia Quick Evaluation

## 2013-06-30 ENCOUNTER — Encounter (HOSPITAL_COMMUNITY): Payer: Self-pay | Admitting: Ophthalmology

## 2016-06-05 LAB — HM MAMMOGRAPHY: HM MAMMO: NORMAL (ref 0–4)

## 2016-09-28 DIAGNOSIS — M79671 Pain in right foot: Secondary | ICD-10-CM | POA: Diagnosis not present

## 2016-09-28 DIAGNOSIS — B351 Tinea unguium: Secondary | ICD-10-CM | POA: Diagnosis not present

## 2016-09-28 DIAGNOSIS — M792 Neuralgia and neuritis, unspecified: Secondary | ICD-10-CM | POA: Diagnosis not present

## 2016-09-28 DIAGNOSIS — E1140 Type 2 diabetes mellitus with diabetic neuropathy, unspecified: Secondary | ICD-10-CM | POA: Diagnosis not present

## 2016-12-01 DIAGNOSIS — I10 Essential (primary) hypertension: Secondary | ICD-10-CM | POA: Diagnosis not present

## 2016-12-01 DIAGNOSIS — Z124 Encounter for screening for malignant neoplasm of cervix: Secondary | ICD-10-CM | POA: Diagnosis not present

## 2016-12-01 DIAGNOSIS — E559 Vitamin D deficiency, unspecified: Secondary | ICD-10-CM | POA: Diagnosis not present

## 2016-12-01 DIAGNOSIS — Z0000 Encounter for general adult medical examination without abnormal findings: Secondary | ICD-10-CM | POA: Diagnosis not present

## 2016-12-01 DIAGNOSIS — E1165 Type 2 diabetes mellitus with hyperglycemia: Secondary | ICD-10-CM | POA: Diagnosis not present

## 2016-12-01 DIAGNOSIS — Z6841 Body Mass Index (BMI) 40.0 and over, adult: Secondary | ICD-10-CM | POA: Diagnosis not present

## 2016-12-01 DIAGNOSIS — R296 Repeated falls: Secondary | ICD-10-CM | POA: Diagnosis not present

## 2016-12-01 DIAGNOSIS — E7801 Familial hypercholesterolemia: Secondary | ICD-10-CM | POA: Diagnosis not present

## 2016-12-01 DIAGNOSIS — Z1231 Encounter for screening mammogram for malignant neoplasm of breast: Secondary | ICD-10-CM | POA: Diagnosis not present

## 2016-12-01 DIAGNOSIS — Z1211 Encounter for screening for malignant neoplasm of colon: Secondary | ICD-10-CM | POA: Diagnosis not present

## 2016-12-07 DIAGNOSIS — M79671 Pain in right foot: Secondary | ICD-10-CM | POA: Diagnosis not present

## 2016-12-07 DIAGNOSIS — R269 Unspecified abnormalities of gait and mobility: Secondary | ICD-10-CM | POA: Diagnosis not present

## 2016-12-07 DIAGNOSIS — B351 Tinea unguium: Secondary | ICD-10-CM | POA: Diagnosis not present

## 2016-12-07 DIAGNOSIS — M6281 Muscle weakness (generalized): Secondary | ICD-10-CM | POA: Diagnosis not present

## 2016-12-07 DIAGNOSIS — M792 Neuralgia and neuritis, unspecified: Secondary | ICD-10-CM | POA: Diagnosis not present

## 2016-12-07 DIAGNOSIS — E1140 Type 2 diabetes mellitus with diabetic neuropathy, unspecified: Secondary | ICD-10-CM | POA: Diagnosis not present

## 2016-12-11 DIAGNOSIS — H35373 Puckering of macula, bilateral: Secondary | ICD-10-CM | POA: Diagnosis not present

## 2016-12-12 DIAGNOSIS — M6281 Muscle weakness (generalized): Secondary | ICD-10-CM | POA: Diagnosis not present

## 2016-12-12 DIAGNOSIS — R269 Unspecified abnormalities of gait and mobility: Secondary | ICD-10-CM | POA: Diagnosis not present

## 2016-12-15 DIAGNOSIS — M6281 Muscle weakness (generalized): Secondary | ICD-10-CM | POA: Diagnosis not present

## 2016-12-15 DIAGNOSIS — R269 Unspecified abnormalities of gait and mobility: Secondary | ICD-10-CM | POA: Diagnosis not present

## 2016-12-19 DIAGNOSIS — R269 Unspecified abnormalities of gait and mobility: Secondary | ICD-10-CM | POA: Diagnosis not present

## 2016-12-19 DIAGNOSIS — M6281 Muscle weakness (generalized): Secondary | ICD-10-CM | POA: Diagnosis not present

## 2016-12-21 DIAGNOSIS — R269 Unspecified abnormalities of gait and mobility: Secondary | ICD-10-CM | POA: Diagnosis not present

## 2016-12-21 DIAGNOSIS — M6281 Muscle weakness (generalized): Secondary | ICD-10-CM | POA: Diagnosis not present

## 2016-12-24 HISTORY — PX: EYE SURGERY: SHX253

## 2017-01-05 DIAGNOSIS — Z87891 Personal history of nicotine dependence: Secondary | ICD-10-CM | POA: Diagnosis not present

## 2017-01-05 DIAGNOSIS — I4891 Unspecified atrial fibrillation: Secondary | ICD-10-CM | POA: Diagnosis not present

## 2017-01-05 DIAGNOSIS — H35371 Puckering of macula, right eye: Secondary | ICD-10-CM | POA: Diagnosis not present

## 2017-01-05 DIAGNOSIS — I480 Paroxysmal atrial fibrillation: Secondary | ICD-10-CM | POA: Diagnosis not present

## 2017-01-05 DIAGNOSIS — R0902 Hypoxemia: Secondary | ICD-10-CM | POA: Diagnosis not present

## 2017-01-05 DIAGNOSIS — Z6841 Body Mass Index (BMI) 40.0 and over, adult: Secondary | ICD-10-CM | POA: Diagnosis not present

## 2017-01-05 DIAGNOSIS — J449 Chronic obstructive pulmonary disease, unspecified: Secondary | ICD-10-CM | POA: Diagnosis not present

## 2017-01-05 DIAGNOSIS — J9611 Chronic respiratory failure with hypoxia: Secondary | ICD-10-CM | POA: Diagnosis not present

## 2017-01-05 DIAGNOSIS — I272 Pulmonary hypertension, unspecified: Secondary | ICD-10-CM | POA: Diagnosis not present

## 2017-01-05 DIAGNOSIS — I499 Cardiac arrhythmia, unspecified: Secondary | ICD-10-CM | POA: Diagnosis not present

## 2017-01-06 DIAGNOSIS — Z7982 Long term (current) use of aspirin: Secondary | ICD-10-CM | POA: Diagnosis not present

## 2017-01-06 DIAGNOSIS — Z881 Allergy status to other antibiotic agents status: Secondary | ICD-10-CM | POA: Diagnosis not present

## 2017-01-06 DIAGNOSIS — Z880 Allergy status to penicillin: Secondary | ICD-10-CM | POA: Diagnosis not present

## 2017-01-06 DIAGNOSIS — R0902 Hypoxemia: Secondary | ICD-10-CM | POA: Diagnosis not present

## 2017-01-06 DIAGNOSIS — E119 Type 2 diabetes mellitus without complications: Secondary | ICD-10-CM | POA: Diagnosis not present

## 2017-01-06 DIAGNOSIS — G4733 Obstructive sleep apnea (adult) (pediatric): Secondary | ICD-10-CM | POA: Diagnosis present

## 2017-01-06 DIAGNOSIS — I10 Essential (primary) hypertension: Secondary | ICD-10-CM | POA: Diagnosis present

## 2017-01-06 DIAGNOSIS — Z882 Allergy status to sulfonamides status: Secondary | ICD-10-CM | POA: Diagnosis not present

## 2017-01-06 DIAGNOSIS — I272 Pulmonary hypertension, unspecified: Secondary | ICD-10-CM | POA: Diagnosis present

## 2017-01-06 DIAGNOSIS — J449 Chronic obstructive pulmonary disease, unspecified: Secondary | ICD-10-CM | POA: Diagnosis present

## 2017-01-06 DIAGNOSIS — J9611 Chronic respiratory failure with hypoxia: Secondary | ICD-10-CM | POA: Diagnosis present

## 2017-01-06 DIAGNOSIS — Z888 Allergy status to other drugs, medicaments and biological substances status: Secondary | ICD-10-CM | POA: Diagnosis not present

## 2017-01-06 DIAGNOSIS — Z79899 Other long term (current) drug therapy: Secondary | ICD-10-CM | POA: Diagnosis not present

## 2017-01-06 DIAGNOSIS — Z87891 Personal history of nicotine dependence: Secondary | ICD-10-CM | POA: Diagnosis not present

## 2017-01-06 DIAGNOSIS — I480 Paroxysmal atrial fibrillation: Secondary | ICD-10-CM | POA: Diagnosis present

## 2017-01-06 DIAGNOSIS — Z6841 Body Mass Index (BMI) 40.0 and over, adult: Secondary | ICD-10-CM | POA: Diagnosis not present

## 2017-01-08 DIAGNOSIS — H35371 Puckering of macula, right eye: Secondary | ICD-10-CM | POA: Diagnosis not present

## 2017-01-09 DIAGNOSIS — J449 Chronic obstructive pulmonary disease, unspecified: Secondary | ICD-10-CM | POA: Diagnosis not present

## 2017-01-09 DIAGNOSIS — E119 Type 2 diabetes mellitus without complications: Secondary | ICD-10-CM | POA: Diagnosis not present

## 2017-01-09 DIAGNOSIS — I10 Essential (primary) hypertension: Secondary | ICD-10-CM | POA: Diagnosis not present

## 2017-01-09 DIAGNOSIS — I480 Paroxysmal atrial fibrillation: Secondary | ICD-10-CM | POA: Diagnosis not present

## 2017-01-10 ENCOUNTER — Ambulatory Visit (INDEPENDENT_AMBULATORY_CARE_PROVIDER_SITE_OTHER): Payer: Medicare Other | Admitting: Cardiovascular Disease

## 2017-01-10 ENCOUNTER — Encounter: Payer: Self-pay | Admitting: Cardiovascular Disease

## 2017-01-10 VITALS — BP 118/64 | HR 67 | Ht 62.5 in | Wt 247.0 lb

## 2017-01-10 DIAGNOSIS — I499 Cardiac arrhythmia, unspecified: Secondary | ICD-10-CM | POA: Diagnosis not present

## 2017-01-10 DIAGNOSIS — I2510 Atherosclerotic heart disease of native coronary artery without angina pectoris: Secondary | ICD-10-CM | POA: Diagnosis not present

## 2017-01-10 DIAGNOSIS — J432 Centrilobular emphysema: Secondary | ICD-10-CM

## 2017-01-10 DIAGNOSIS — R0609 Other forms of dyspnea: Secondary | ICD-10-CM

## 2017-01-10 DIAGNOSIS — I700 Atherosclerosis of aorta: Secondary | ICD-10-CM

## 2017-01-10 DIAGNOSIS — I272 Pulmonary hypertension, unspecified: Secondary | ICD-10-CM

## 2017-01-10 DIAGNOSIS — Z9289 Personal history of other medical treatment: Secondary | ICD-10-CM

## 2017-01-10 NOTE — Patient Instructions (Signed)
Medication Instructions:  Continue all current medications.  Labwork: none  Testing/Procedures:  Your physician has requested that you have an echocardiogram. Echocardiography is a painless test that uses sound waves to create images of your heart. It provides your doctor with information about the size and shape of your heart and how well your heart's chambers and valves are working. This procedure takes approximately one hour. There are no restrictions for this procedure.  Your physician has recommended that you wear a 30 day event monitor. Event monitors are medical devices that record the heart's electrical activity. Doctors most often [place] these monitors to diagnose arrhythmias. Arrhythmias are problems with the speed or rhythm of the heartbeat. The monitor is a small, portable device. You can wear one while you do your normal daily activities. This is usually used to diagnose what is causing palpitations/syncope (passing out).  Office will contact with results via phone or letter.    Follow-Up: 3 months   Any Other Special Instructions Will Be Listed Below (If Applicable).  If you need a refill on your cardiac medications before your next appointment, please call your pharmacy.

## 2017-01-10 NOTE — Progress Notes (Signed)
CARDIOLOGY CONSULT NOTE  Patient ID: Linda [name] MRN: [id number] DOB/AGE: [birth year] 71 y.o.  Admit date: (Not on file) Primary Physician: Linda Savage, MD Referring Physician: Wenda [name]  Reason for Consultation: arrhythmia  HPI: Linda [name] is a 71 y.o. [name] who is being seen today for the evaluation of arrhythmia at the request of Linda Savage, MD.   She was recently hospitalized at [facility] and discharged on 01/07/17. I personally reviewed all relevant documentation, labs, and studies pertaining to this hospitalization.  She was noted to be hypoxemic with oxygen saturations of 77% on room air. This occurred when she was in [place] where she underwent eye surgery. She was evaluated for pulmonary embolism and this was negative. She was noted to have transient episodes of what was thought to be atrial fibrillation. ECG showed sinus rhythm with PACs.  Rhythm strips from PACU reviewed by Dr. Ronne [name] who felt that they were inconclusive for atrial fibrillation and showed a significant amount of artifact but the rhythm was very irregular. He did not feel any correlation is warranted. He wondered if she needed a 30 day event monitor.  She has a 42-pack-year history of smoking and it was felt that her hypoxemia may have been due to underlying COPD and possibly pulmonary hypertension. Oxygen saturations at rest were 90-92% but with minimal walking it dropped to 80%. She was placed on 2 L of oxygen. Pulmonary function tests were done which showed Gold class III COPD she was discharged on aspirin, Cozaar, pravastatin, and Toprol-XL.  Troponins were normal. Other labs performed on 4/13 showed the following: BUN 16, creatinine 0.91, hemoglobin 11.8, platelets 179, sodium 140, TSH 1.14, d-dimer 1.  Chest x-ray showed no active cardiopulmonary disease. It did show aortic atherosclerosis.  CTA of the chest showed no evidence of embolism but did show mild centrilobular emphysema. The  main pulmonary artery was enlarged which may represent pulmonary artery hypertension. There is moderate coronary artery calcification. There are multiple pulmonary nodules measuring up to 5 mm.  ECG performed on 01/05/17 at 8:40 PM which I personally interpreted demonstrated sinus rhythm with PACs and sinus arrhythmia.  ECG performed on 01/05/17 at 5:44 PM which are percent interpreted demonstrated sinus rhythm with late R-wave transition.  ECG performed on 01/05/17 at 5:35 PM which I personally interpreted demonstrated sinus rhythm with frequent PACs.  She quit smoking 16 years ago. She denies exertional chest pain and palpitations. She denies a history of myocardial infarction. She does not feel her PACs.  She has exertional dyspnea which she thought was due to being out of shape. She tells me she was prescribed Spiriva and an inhaler over a year ago by her PCP but she never took it.  She thinks she had a nuclear stress test done in the past few years.  Now that she is on oxygen, her shortness of breath and fatigue have significantly improved.  She is scheduled to see a pulmonologist in [place], [place], on 01/24/17. She thought she was here for a pulmonary visit.  Soc Hx: She is widowed. Her husband passed away several years ago. She has a son who lives in [place] state.   Allergies  Allergen Reactions  . Ppd [Tuberculin Purified Protein Derivative] Swelling  . Adhesive [Tape] Itching  . Trazodone And Nefazodone     "blisters my skin"   . Bactrim [Sulfamethoxazole-Trimethoprim] Itching and Rash  . Penicillins Itching and Rash    Current Outpatient Prescriptions  Medication  Sig Dispense Refill  . acetaminophen (TYLENOL) 500 MG tablet Take 500 mg by mouth daily as needed for pain.    Marland Kitchen aspirin EC 81 MG tablet Take 81 mg by mouth daily.    . cetirizine (ZYRTEC) 10 MG tablet Take 10 mg by mouth daily.    . citalopram (CELEXA) 20 MG tablet Take 20 mg by mouth daily.    Marland Kitchen losartan  (COZAAR) 100 MG tablet Take 100 mg by mouth daily.    . metoprolol succinate (TOPROL-XL) 50 MG 24 hr tablet Take 50 mg by mouth daily. Take with or immediately following a meal.    . pravastatin (PRAVACHOL) 20 MG tablet Take 20 mg by mouth daily.     No current facility-administered medications for this visit.     Past Medical History:  Diagnosis Date  . Diabetes mellitus without complication ([place])   . Hypertension     Past Surgical History:  Procedure Laterality Date  . ABDOMINAL HYSTERECTOMY    . CARPAL TUNNEL RELEASE    . CATARACT EXTRACTION W/PHACO Left 06/16/2013   Procedure: CATARACT EXTRACTION PHACO AND INTRAOCULAR LENS PLACEMENT (IOC);  Surgeon: Linda Branch, MD;  Location: [facility];  Service: Ophthalmology;  Laterality: Left;  CDE:  12.18  . CATARACT EXTRACTION W/PHACO Right 06/26/2013   Procedure: CATARACT EXTRACTION PHACO AND INTRAOCULAR LENS PLACEMENT (IOC);  Surgeon: Linda Branch, MD;  Location: [facility];  Service: Ophthalmology;  Laterality: Right;  CDE:14.71  . CESAREAN SECTION    . CHOLECYSTECTOMY    . 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 DILATION AND CURETTAGE OF UTERUS    . FOOT NEUROMA SURGERY Right   . MOUTH SURGERY    . TONSILLECTOMY      Social History   Social History  . Marital status: Widowed    Spouse name: N/A  . Number of children: N/A  . Years of education: N/A   Occupational History  . Not on file.   Social History Main Topics  . Smoking status: Former Smoker    Types: Cigarettes  . Smokeless tobacco: Never Used     Comment: quit 16 year ago 01/10/17  . Alcohol use No  . Drug use: No  . Sexual activity: Not on file   Other Topics Concern  . Not on file   Social History Narrative  . No narrative on file     No family history of premature CAD in 1st degree relatives.  Current Meds  Medication Sig  . acetaminophen (TYLENOL) 500 MG tablet Take 500 mg by mouth daily as needed for pain.  Marland Kitchen aspirin EC 81 MG  tablet Take 81 mg by mouth daily.  . cetirizine (ZYRTEC) 10 MG tablet Take 10 mg by mouth daily.  . citalopram (CELEXA) 20 MG tablet Take 20 mg by mouth daily.  Marland Kitchen losartan (COZAAR) 100 MG tablet Take 100 mg by mouth daily.  . metoprolol succinate (TOPROL-XL) 50 MG 24 hr tablet Take 50 mg by mouth daily. Take with or immediately following a meal.  . pravastatin (PRAVACHOL) 20 MG tablet Take 20 mg by mouth daily.      Review of systems complete and found to be negative unless listed above in HPI    Physical exam Blood pressure 118/64, pulse 67, height 5' 2.5" (1.588 m), weight 247 lb (112 kg), SpO2 97 %. General: NAD Neck: No JVD, no thyromegaly or thyroid nodule.  Lungs: Clear to auscultation bilaterally with normal respiratory effort. CV:  Nondisplaced PMI. Regular rate and rhythm, normal S1/S2, no S3/S4, no murmur.  Trivial b/l pretibial edema.  No carotid bruit.    Abdomen: Soft, nontender, obese. Skin: Intact without lesions or rashes.  Neurologic: Alert and oriented x 3.  Psych: Normal affect. Extremities: No clubbing or cyanosis.  HEENT: Normal.   ECG: Most recent ECG reviewed.   Labs: Lab Results  Component Value Date/Time   K 4.3 06/09/2013 01:50 PM   BUN 16 06/09/2013 01:50 PM   CREATININE 0.96 06/09/2013 01:50 PM   HGB 13.6 06/09/2013 01:50 PM     Lipids: No results found for: LDLCALC, LDLDIRECT, CHOL, TRIG, HDL      ASSESSMENT AND PLAN:  1. Arrhythmia: As stated above, I independently reviewed all ECGs which demonstrated sinus rhythm and sinus rhythm with PACs. Thus far there has been no evidence of atrial fibrillation. COPD can provoke atrial arrhythmias such as PACs as well as atrial fibrillation. I will obtain a 30 day event monitor. I will order a 2-D echocardiogram with Doppler to evaluate cardiac structure, function, and regional wall motion.  2. Pulmonary hypertension: This was seen on CTA of the chest. I will order a 2-D echocardiogram with Doppler to  evaluate cardiac structure, function, and regional wall motion.  3. Coronary artery calcifications on CT/aortic atherosclerosis: I will try and obtain a copy of her most recent nuclear stress test for personal review. I will obtain an echocardiogram to evaluate cardiac structure and function. She is currently on aspirin, metoprolol, and statin. No changes to therapy.   Disposition: Follow up in 3 months  Signed: Kate Sable, M.D., F.A.C.C.  01/10/2017, 3:52 PM

## 2017-01-11 ENCOUNTER — Encounter: Payer: Self-pay | Admitting: *Deleted

## 2017-01-19 DIAGNOSIS — R0689 Other abnormalities of breathing: Secondary | ICD-10-CM | POA: Diagnosis not present

## 2017-01-19 DIAGNOSIS — G4733 Obstructive sleep apnea (adult) (pediatric): Secondary | ICD-10-CM | POA: Diagnosis not present

## 2017-01-19 DIAGNOSIS — E669 Obesity, unspecified: Secondary | ICD-10-CM | POA: Diagnosis not present

## 2017-01-19 DIAGNOSIS — G4761 Periodic limb movement disorder: Secondary | ICD-10-CM | POA: Diagnosis not present

## 2017-01-19 DIAGNOSIS — E119 Type 2 diabetes mellitus without complications: Secondary | ICD-10-CM | POA: Diagnosis not present

## 2017-01-19 DIAGNOSIS — K219 Gastro-esophageal reflux disease without esophagitis: Secondary | ICD-10-CM | POA: Diagnosis not present

## 2017-01-19 DIAGNOSIS — I4891 Unspecified atrial fibrillation: Secondary | ICD-10-CM | POA: Diagnosis not present

## 2017-01-19 DIAGNOSIS — I10 Essential (primary) hypertension: Secondary | ICD-10-CM | POA: Diagnosis not present

## 2017-01-19 DIAGNOSIS — J449 Chronic obstructive pulmonary disease, unspecified: Secondary | ICD-10-CM | POA: Diagnosis not present

## 2017-01-23 ENCOUNTER — Ambulatory Visit (INDEPENDENT_AMBULATORY_CARE_PROVIDER_SITE_OTHER): Payer: Medicare Other

## 2017-01-23 DIAGNOSIS — I499 Cardiac arrhythmia, unspecified: Secondary | ICD-10-CM | POA: Diagnosis not present

## 2017-01-24 DIAGNOSIS — E6609 Other obesity due to excess calories: Secondary | ICD-10-CM | POA: Diagnosis not present

## 2017-01-24 DIAGNOSIS — R0602 Shortness of breath: Secondary | ICD-10-CM | POA: Diagnosis not present

## 2017-01-24 DIAGNOSIS — J9611 Chronic respiratory failure with hypoxia: Secondary | ICD-10-CM | POA: Diagnosis not present

## 2017-01-24 DIAGNOSIS — J449 Chronic obstructive pulmonary disease, unspecified: Secondary | ICD-10-CM | POA: Diagnosis not present

## 2017-01-24 DIAGNOSIS — R0689 Other abnormalities of breathing: Secondary | ICD-10-CM | POA: Diagnosis not present

## 2017-01-24 DIAGNOSIS — G4730 Sleep apnea, unspecified: Secondary | ICD-10-CM | POA: Diagnosis not present

## 2017-01-24 DIAGNOSIS — R5382 Chronic fatigue, unspecified: Secondary | ICD-10-CM | POA: Diagnosis not present

## 2017-01-24 DIAGNOSIS — J9612 Chronic respiratory failure with hypercapnia: Secondary | ICD-10-CM | POA: Diagnosis not present

## 2017-01-24 DIAGNOSIS — R918 Other nonspecific abnormal finding of lung field: Secondary | ICD-10-CM | POA: Diagnosis not present

## 2017-01-24 DIAGNOSIS — Z87891 Personal history of nicotine dependence: Secondary | ICD-10-CM | POA: Diagnosis not present

## 2017-01-25 ENCOUNTER — Other Ambulatory Visit: Payer: Self-pay

## 2017-01-25 ENCOUNTER — Ambulatory Visit (INDEPENDENT_AMBULATORY_CARE_PROVIDER_SITE_OTHER): Payer: Medicare Other

## 2017-01-25 ENCOUNTER — Telehealth: Payer: Self-pay | Admitting: *Deleted

## 2017-01-25 DIAGNOSIS — I499 Cardiac arrhythmia, unspecified: Secondary | ICD-10-CM | POA: Diagnosis not present

## 2017-01-25 DIAGNOSIS — R0609 Other forms of dyspnea: Secondary | ICD-10-CM | POA: Diagnosis not present

## 2017-01-25 NOTE — Telephone Encounter (Signed)
Patient is c/o swelling in her feet off/on since getting out of hospital on 01/07/17.  Questions if she could take a diuretic. States her SOB is about the same & she has actually lost a few pounds.  States that is trying to lose weight & really trying to watch her salt intake.  Drinks lots of water.  Stated that she has been on Furosemide in the past, but what she had at home was from 2016.  Informed patient that message will be sent to provider for further advice.  Okay to leave reply on her voice mail.

## 2017-01-25 NOTE — Telephone Encounter (Signed)
Notes recorded by Laurine Blazer, LPN on 0/10/1113 at 5:20 PM EDT Patient notified. Copy to pmd. Follow up scheduled for 04/09/2017. ------  Notes recorded by Herminio Commons, MD on 01/25/2017 at 1:01 PM EDT Normal pumping function.

## 2017-01-25 NOTE — Telephone Encounter (Signed)
Can take Lasix 20 mg prn.

## 2017-01-26 MED ORDER — FUROSEMIDE 20 MG PO TABS: 20.0000 mg | ORAL_TABLET | ORAL | 1 refills | Status: DC | PRN

## 2017-01-26 NOTE — Telephone Encounter (Signed)
Patient notified via voice mail.  New prescription sent to [facility] today.  She has follow up scheduled for 04/09/17 with Dr. Bronson Ing.

## 2017-02-15 DIAGNOSIS — E1140 Type 2 diabetes mellitus with diabetic neuropathy, unspecified: Secondary | ICD-10-CM | POA: Diagnosis not present

## 2017-02-15 DIAGNOSIS — M792 Neuralgia and neuritis, unspecified: Secondary | ICD-10-CM | POA: Diagnosis not present

## 2017-02-15 DIAGNOSIS — B351 Tinea unguium: Secondary | ICD-10-CM | POA: Diagnosis not present

## 2017-02-15 DIAGNOSIS — M79671 Pain in right foot: Secondary | ICD-10-CM | POA: Diagnosis not present

## 2017-02-28 ENCOUNTER — Telehealth: Payer: Self-pay | Admitting: *Deleted

## 2017-02-28 MED ORDER — APIXABAN 5 MG PO TABS: 5.0000 mg | ORAL_TABLET | Freq: Two times a day (BID) | ORAL | 3 refills | Status: DC

## 2017-02-28 NOTE — Addendum Note (Signed)
Addended by: Merlene Laughter on: 02/28/2017 04:31 PM   Modules accepted: Orders

## 2017-02-28 NOTE — Telephone Encounter (Signed)
-----   Message from Massie Maroon, [place] sent at 02/28/2017  9:46 AM EDT -----   ----- Message ----- From: Herminio Commons, MD Sent: 02/28/2017   9:40 AM To: Massie Maroon, CMA  Rapid atrial fibrillation seen. Stop ASA and start Eliquis 5 mg bid.

## 2017-02-28 NOTE — Telephone Encounter (Signed)
Patient informed and verbalized understanding of plan. 30 day free voucher and samples of eliquis left for patient pick up.

## 2017-03-06 DIAGNOSIS — G4730 Sleep apnea, unspecified: Secondary | ICD-10-CM | POA: Diagnosis not present

## 2017-03-06 DIAGNOSIS — J9612 Chronic respiratory failure with hypercapnia: Secondary | ICD-10-CM | POA: Diagnosis not present

## 2017-03-06 DIAGNOSIS — R942 Abnormal results of pulmonary function studies: Secondary | ICD-10-CM | POA: Diagnosis not present

## 2017-03-06 DIAGNOSIS — R918 Other nonspecific abnormal finding of lung field: Secondary | ICD-10-CM | POA: Diagnosis not present

## 2017-03-06 DIAGNOSIS — J984 Other disorders of lung: Secondary | ICD-10-CM | POA: Diagnosis not present

## 2017-03-06 DIAGNOSIS — J449 Chronic obstructive pulmonary disease, unspecified: Secondary | ICD-10-CM | POA: Diagnosis not present

## 2017-03-06 DIAGNOSIS — R0689 Other abnormalities of breathing: Secondary | ICD-10-CM | POA: Diagnosis not present

## 2017-03-06 DIAGNOSIS — R0602 Shortness of breath: Secondary | ICD-10-CM | POA: Diagnosis not present

## 2017-03-06 DIAGNOSIS — Z87891 Personal history of nicotine dependence: Secondary | ICD-10-CM | POA: Diagnosis not present

## 2017-03-12 ENCOUNTER — Other Ambulatory Visit: Payer: Self-pay | Admitting: *Deleted

## 2017-03-12 MED ORDER — APIXABAN 5 MG PO TABS: 5.0000 mg | ORAL_TABLET | Freq: Two times a day (BID) | ORAL | 3 refills | Status: DC

## 2017-03-13 DIAGNOSIS — H3581 Retinal edema: Secondary | ICD-10-CM | POA: Diagnosis not present

## 2017-03-13 DIAGNOSIS — H35371 Puckering of macula, right eye: Secondary | ICD-10-CM | POA: Diagnosis not present

## 2017-03-23 DIAGNOSIS — J440 Chronic obstructive pulmonary disease with acute lower respiratory infection: Secondary | ICD-10-CM | POA: Diagnosis not present

## 2017-03-23 DIAGNOSIS — I10 Essential (primary) hypertension: Secondary | ICD-10-CM | POA: Diagnosis not present

## 2017-03-23 DIAGNOSIS — I482 Chronic atrial fibrillation: Secondary | ICD-10-CM | POA: Diagnosis not present

## 2017-03-23 DIAGNOSIS — J3089 Other allergic rhinitis: Secondary | ICD-10-CM | POA: Diagnosis not present

## 2017-03-23 DIAGNOSIS — F330 Major depressive disorder, recurrent, mild: Secondary | ICD-10-CM | POA: Diagnosis not present

## 2017-03-23 DIAGNOSIS — E784 Other hyperlipidemia: Secondary | ICD-10-CM | POA: Diagnosis not present

## 2017-03-23 DIAGNOSIS — E1165 Type 2 diabetes mellitus with hyperglycemia: Secondary | ICD-10-CM | POA: Diagnosis not present

## 2017-03-30 DIAGNOSIS — J984 Other disorders of lung: Secondary | ICD-10-CM | POA: Diagnosis not present

## 2017-03-30 DIAGNOSIS — E669 Obesity, unspecified: Secondary | ICD-10-CM | POA: Diagnosis not present

## 2017-03-30 DIAGNOSIS — G4730 Sleep apnea, unspecified: Secondary | ICD-10-CM | POA: Diagnosis not present

## 2017-03-30 DIAGNOSIS — Z6841 Body Mass Index (BMI) 40.0 and over, adult: Secondary | ICD-10-CM | POA: Diagnosis not present

## 2017-04-09 ENCOUNTER — Encounter: Payer: Self-pay | Admitting: Cardiovascular Disease

## 2017-04-09 ENCOUNTER — Ambulatory Visit (INDEPENDENT_AMBULATORY_CARE_PROVIDER_SITE_OTHER): Payer: Medicare Other | Admitting: Cardiovascular Disease

## 2017-04-09 VITALS — BP 118/68 | HR 70 | Ht 62.0 in | Wt 240.0 lb

## 2017-04-09 DIAGNOSIS — I700 Atherosclerosis of aorta: Secondary | ICD-10-CM | POA: Diagnosis not present

## 2017-04-09 DIAGNOSIS — I272 Pulmonary hypertension, unspecified: Secondary | ICD-10-CM

## 2017-04-09 DIAGNOSIS — I10 Essential (primary) hypertension: Secondary | ICD-10-CM | POA: Diagnosis not present

## 2017-04-09 DIAGNOSIS — I4891 Unspecified atrial fibrillation: Secondary | ICD-10-CM | POA: Diagnosis not present

## 2017-04-09 DIAGNOSIS — I2510 Atherosclerotic heart disease of native coronary artery without angina pectoris: Secondary | ICD-10-CM

## 2017-04-09 DIAGNOSIS — R0609 Other forms of dyspnea: Secondary | ICD-10-CM | POA: Diagnosis not present

## 2017-04-09 MED ORDER — METOPROLOL SUCCINATE ER 50 MG PO TB24: ORAL_TABLET | ORAL | 3 refills | Status: DC

## 2017-04-09 MED ORDER — LOSARTAN POTASSIUM 50 MG PO TABS: 75.0000 mg | ORAL_TABLET | Freq: Every day | ORAL | 3 refills | Status: DC

## 2017-04-09 NOTE — Patient Instructions (Addendum)
Medication Instructions:   Increase Toprol XL to 75mg  every morning & 25mg  every evening.  Decrease Losartan to 75mg  daily.  Continue all other medications.    Labwork: none  Testing/Procedures: none  Follow-Up: 6-8 weeks   Any Other Special Instructions Will Be Listed Below (If Applicable).  If you need a refill on your cardiac medications before your next appointment, please call your pharmacy.

## 2017-04-09 NOTE — Progress Notes (Signed)
SUBJECTIVE: The patient presents for follow-up of atrial fibrillation, pulmonary hypertension, and coronary artery calcifications.  Event monitoring demonstrated multiple episodes of rapid atrial fibrillation and occasionally flutter. I started Eliquis 5 mg twice daily and stopped aspirin.  Echocardiogram 01/25/17 showed normal left ventricular systolic and diastolic function, LVEF 25-05%, with moderately increased pulmonary pressures, 43 mmHg.  She has been experiencing frequent palpitations about 3 or 4 days per week. Most recent episode lasted 30 minutes. She denies chest pain. She recently had a bout of bronchitis and then had a viral gastroenteritis.    Review of Systems: As per "subjective", otherwise negative.  Allergies  Allergen Reactions  . Ppd [Tuberculin Purified Protein Derivative] Swelling  . Adhesive [Tape] Itching  . Trazodone And Nefazodone     "blisters my skin"   . Bactrim [Sulfamethoxazole-Trimethoprim] Itching and Rash  . Penicillins Itching and Rash    Current Outpatient Prescriptions  Medication Sig Dispense Refill  . acetaminophen (TYLENOL) 500 MG tablet Take 500 mg by mouth daily as needed for pain.    Marland Kitchen apixaban (ELIQUIS) 5 MG TABS tablet Take 1 tablet (5 mg total) by mouth 2 (two) times daily. 180 tablet 3  . cetirizine (ZYRTEC) 10 MG tablet Take 10 mg by mouth daily.    . citalopram (CELEXA) 20 MG tablet Take 20 mg by mouth daily.    . Fluticasone-Salmeterol (ADVAIR HFA IN) Inhale into the lungs.    . furosemide (LASIX) 20 MG tablet Take 1 tablet (20 mg total) by mouth as needed. 30 tablet 1  . losartan (COZAAR) 100 MG tablet Take 100 mg by mouth daily.    . metoprolol succinate (TOPROL-XL) 50 MG 24 hr tablet Take 50 mg by mouth daily. Take with or immediately following a meal.    . pravastatin (PRAVACHOL) 20 MG tablet Take 20 mg by mouth daily.    Marland Kitchen tiotropium (SPIRIVA) 18 MCG inhalation capsule Place 18 mcg into inhaler and inhale daily.     No  current facility-administered medications for this visit.     Past Medical History:  Diagnosis Date  . Diabetes mellitus without complication ([place])   . Hypertension     Past Surgical History:  Procedure Laterality Date  . ABDOMINAL HYSTERECTOMY    . CARPAL TUNNEL RELEASE    . CATARACT EXTRACTION W/PHACO Left 06/16/2013   Procedure: CATARACT EXTRACTION PHACO AND INTRAOCULAR LENS PLACEMENT (IOC);  Surgeon: Tonny Branch, MD;  Location: [facility];  Service: Ophthalmology;  Laterality: Left;  CDE:  12.18  . CATARACT EXTRACTION W/PHACO Right 06/26/2013   Procedure: CATARACT EXTRACTION PHACO AND INTRAOCULAR LENS PLACEMENT (IOC);  Surgeon: Tonny Branch, MD;  Location: [facility];  Service: Ophthalmology;  Laterality: Right;  CDE:14.71  . CESAREAN SECTION    . CHOLECYSTECTOMY    . 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 DILATION AND CURETTAGE OF UTERUS    . FOOT NEUROMA SURGERY Right   . MOUTH SURGERY    . TONSILLECTOMY      Social History   Social History  . Marital status: Widowed    Spouse name: N/A  . Number of children: N/A  . Years of education: N/A   Occupational History  . Not on file.   Social History Main Topics  . Smoking status: Former Smoker    Types: Cigarettes  . Smokeless tobacco: Never Used     Comment: quit 16 year ago 01/10/17  . Alcohol use No  .  Drug use: No  . Sexual activity: Not on file   Other Topics Concern  . Not on file   Social History Narrative  . No narrative on file     Vitals:   04/09/17 1314  BP: 118/68  Pulse: 70  SpO2: 94%  Weight: 240 lb (108.9 kg)  Height: 5\' 2"  (1.575 m)    Wt Readings from Last 3 Encounters:  04/09/17 240 lb (108.9 kg)  01/10/17 247 lb (112 kg)  06/09/13 250 lb (113.4 kg)     PHYSICAL EXAM General: NAD HEENT: Normal. Neck: No JVD, no thyromegaly. Lungs: Clear to auscultation bilaterally with normal respiratory effort. CV: Nondisplaced PMI.  Regular rate and rhythm, normal  S1/S2, no S3/S4, no murmur. No pretibial or periankle edema.  No carotid bruit.   Abdomen: Soft, protuberant.  Neurologic: Alert and oriented.  Psych: Normal affect. Skin: Normal. Musculoskeletal: No gross deformities.    ECG: Most recent ECG reviewed.   Labs: Lab Results  Component Value Date/Time   K 4.3 06/09/2013 01:50 PM   BUN 16 06/09/2013 01:50 PM   CREATININE 0.96 06/09/2013 01:50 PM   HGB 13.6 06/09/2013 01:50 PM     Lipids: No results found for: LDLCALC, LDLDIRECT, CHOL, TRIG, HDL     ASSESSMENT AND PLAN:  1. Persistent atrial fibirllation: She has frequent palpitations. I have already commenced Eliquis for anticoagulation. I will increase Toprol-XL to 75 mg q am and 25 mg q pm. I will reduce losartan to 75 mg daily in order to not potentiate hypotension.  2. Pulmonary hypertension: This was seen on CTA of the chest. Moderately elevated pulmonary pressures by echocardiography as detailed above. Likely due to COPD. Diastolic function was normal.  3. Coronary artery calcifications on CT/aortic atherosclerosis: Nuclear stress test in June 2013 demonstrated "probably normal perfusion". Cardiac function is normal. Continue beta blocker and statin.    Disposition: Follow up 6-8 weeks   Kate Sable, M.D., F.A.C.C.

## 2017-04-17 DIAGNOSIS — G4730 Sleep apnea, unspecified: Secondary | ICD-10-CM | POA: Diagnosis not present

## 2017-04-17 DIAGNOSIS — J984 Other disorders of lung: Secondary | ICD-10-CM | POA: Diagnosis not present

## 2017-04-17 DIAGNOSIS — R0602 Shortness of breath: Secondary | ICD-10-CM | POA: Diagnosis not present

## 2017-04-17 DIAGNOSIS — E669 Obesity, unspecified: Secondary | ICD-10-CM | POA: Diagnosis not present

## 2017-04-17 DIAGNOSIS — R0689 Other abnormalities of breathing: Secondary | ICD-10-CM | POA: Diagnosis not present

## 2017-04-17 DIAGNOSIS — Z87891 Personal history of nicotine dependence: Secondary | ICD-10-CM | POA: Diagnosis not present

## 2017-04-17 DIAGNOSIS — J449 Chronic obstructive pulmonary disease, unspecified: Secondary | ICD-10-CM | POA: Diagnosis not present

## 2017-04-17 DIAGNOSIS — R918 Other nonspecific abnormal finding of lung field: Secondary | ICD-10-CM | POA: Diagnosis not present

## 2017-04-27 DIAGNOSIS — H35373 Puckering of macula, bilateral: Secondary | ICD-10-CM | POA: Diagnosis not present

## 2017-04-27 DIAGNOSIS — Z961 Presence of intraocular lens: Secondary | ICD-10-CM | POA: Diagnosis not present

## 2017-04-27 DIAGNOSIS — I10 Essential (primary) hypertension: Secondary | ICD-10-CM | POA: Diagnosis not present

## 2017-04-27 DIAGNOSIS — H524 Presbyopia: Secondary | ICD-10-CM | POA: Diagnosis not present

## 2017-04-27 DIAGNOSIS — H179 Unspecified corneal scar and opacity: Secondary | ICD-10-CM | POA: Diagnosis not present

## 2017-04-27 DIAGNOSIS — H5202 Hypermetropia, left eye: Secondary | ICD-10-CM | POA: Diagnosis not present

## 2017-04-27 DIAGNOSIS — H35031 Hypertensive retinopathy, right eye: Secondary | ICD-10-CM | POA: Diagnosis not present

## 2017-04-27 DIAGNOSIS — H52222 Regular astigmatism, left eye: Secondary | ICD-10-CM | POA: Diagnosis not present

## 2017-04-29 DIAGNOSIS — G4733 Obstructive sleep apnea (adult) (pediatric): Secondary | ICD-10-CM | POA: Diagnosis not present

## 2017-05-03 DIAGNOSIS — M792 Neuralgia and neuritis, unspecified: Secondary | ICD-10-CM | POA: Diagnosis not present

## 2017-05-03 DIAGNOSIS — B351 Tinea unguium: Secondary | ICD-10-CM | POA: Diagnosis not present

## 2017-05-03 DIAGNOSIS — M79671 Pain in right foot: Secondary | ICD-10-CM | POA: Diagnosis not present

## 2017-05-03 DIAGNOSIS — E1140 Type 2 diabetes mellitus with diabetic neuropathy, unspecified: Secondary | ICD-10-CM | POA: Diagnosis not present

## 2017-05-10 DIAGNOSIS — H35371 Puckering of macula, right eye: Secondary | ICD-10-CM | POA: Diagnosis not present

## 2017-05-21 ENCOUNTER — Ambulatory Visit (INDEPENDENT_AMBULATORY_CARE_PROVIDER_SITE_OTHER): Payer: Medicare Other | Admitting: Cardiovascular Disease

## 2017-05-21 ENCOUNTER — Encounter: Payer: Self-pay | Admitting: Cardiovascular Disease

## 2017-05-21 VITALS — BP 115/72 | HR 73 | Ht 62.0 in | Wt 249.2 lb

## 2017-05-21 DIAGNOSIS — I2510 Atherosclerotic heart disease of native coronary artery without angina pectoris: Secondary | ICD-10-CM | POA: Diagnosis not present

## 2017-05-21 DIAGNOSIS — I4891 Unspecified atrial fibrillation: Secondary | ICD-10-CM

## 2017-05-21 DIAGNOSIS — J432 Centrilobular emphysema: Secondary | ICD-10-CM | POA: Diagnosis not present

## 2017-05-21 DIAGNOSIS — I272 Pulmonary hypertension, unspecified: Secondary | ICD-10-CM

## 2017-05-21 DIAGNOSIS — G4733 Obstructive sleep apnea (adult) (pediatric): Secondary | ICD-10-CM | POA: Diagnosis not present

## 2017-05-21 DIAGNOSIS — R0689 Other abnormalities of breathing: Secondary | ICD-10-CM | POA: Diagnosis not present

## 2017-05-21 DIAGNOSIS — J302 Other seasonal allergic rhinitis: Secondary | ICD-10-CM | POA: Diagnosis not present

## 2017-05-21 DIAGNOSIS — Z6841 Body Mass Index (BMI) 40.0 and over, adult: Secondary | ICD-10-CM | POA: Diagnosis not present

## 2017-05-21 MED ORDER — LOSARTAN POTASSIUM 50 MG PO TABS: 50.0000 mg | ORAL_TABLET | Freq: Every day | ORAL | Status: DC

## 2017-05-21 MED ORDER — METOPROLOL SUCCINATE ER 50 MG PO TB24: ORAL_TABLET | ORAL | 3 refills | Status: DC

## 2017-05-21 NOTE — Patient Instructions (Signed)
Medication Instructions:   Increase Toprol XL to 75mg  every morning & 50mg  every evening.  Decrease Losartan to 50mg  daily.  Continue all other medications.    Labwork: none  Testing/Procedures: none  Follow-Up: 3 months   Any Other Special Instructions Will Be Listed Below (If Applicable).  If you need a refill on your cardiac medications before your next appointment, please call your pharmacy.

## 2017-05-21 NOTE — Progress Notes (Signed)
SUBJECTIVE: The patient presents for follow-up of persistent atrial fibrillation. In spite of increasing her dose of Toprol-XL at her last visit, she complains of frequent palpitations up to 150 bpm. She was diagnosed with sleep apnea and will begin CPAP today. She denies exertional chest pain.   Review of Systems: As per "subjective", otherwise negative.  Allergies  Allergen Reactions  . Ppd [Tuberculin Purified Protein Derivative] Swelling  . Adhesive [Tape] Itching  . Trazodone And Nefazodone     "blisters my skin"   . Bactrim [Sulfamethoxazole-Trimethoprim] Itching and Rash  . Penicillins Itching and Rash    Current Outpatient Prescriptions  Medication Sig Dispense Refill  . acetaminophen (TYLENOL) 500 MG tablet Take 500 mg by mouth daily as needed for pain.    Marland Kitchen albuterol (PROVENTIL HFA;VENTOLIN HFA) 108 (90 Base) MCG/ACT inhaler Inhale 1 puff into the lungs as needed for wheezing or shortness of breath.    Marland Kitchen apixaban (ELIQUIS) 5 MG TABS tablet Take 1 tablet (5 mg total) by mouth 2 (two) times daily. 180 tablet 3  . cetirizine (ZYRTEC) 10 MG tablet Take 10 mg by mouth daily.    . citalopram (CELEXA) 20 MG tablet Take 20 mg by mouth daily.    . Fluticasone-Salmeterol (ADVAIR DISKUS) 250-50 MCG/DOSE AEPB Inhale 1 puff into the lungs 2 (two) times daily.    Marland Kitchen losartan (COZAAR) 50 MG tablet Take 1 tablet (50 mg total) by mouth daily.    . metFORMIN (GLUCOPHAGE) 1000 MG tablet Take 1,000 mg by mouth daily.    . metoprolol succinate (TOPROL-XL) 50 MG 24 hr tablet Take 1 1/2 tabs (75mg ) every morning & 1 tab (50mg ) every evening 225 tablet 3  . pravastatin (PRAVACHOL) 20 MG tablet Take 20 mg by mouth daily.    Marland Kitchen tiotropium (SPIRIVA) 18 MCG inhalation capsule Place 18 mcg into inhaler and inhale daily.    . furosemide (LASIX) 20 MG tablet Take 1 tablet (20 mg total) by mouth as needed. (Patient not taking: Reported on 05/21/2017) 30 tablet 1   No current facility-administered  medications for this visit.     Past Medical History:  Diagnosis Date  . Diabetes mellitus without complication ([place])   . Hypertension     Past Surgical History:  Procedure Laterality Date  . ABDOMINAL HYSTERECTOMY    . CARPAL TUNNEL RELEASE    . CATARACT EXTRACTION W/PHACO Left 06/16/2013   Procedure: CATARACT EXTRACTION PHACO AND INTRAOCULAR LENS PLACEMENT (IOC);  Surgeon: Tonny Branch, MD;  Location: [facility];  Service: Ophthalmology;  Laterality: Left;  CDE:  12.18  . CATARACT EXTRACTION W/PHACO Right 06/26/2013   Procedure: CATARACT EXTRACTION PHACO AND INTRAOCULAR LENS PLACEMENT (IOC);  Surgeon: Tonny Branch, MD;  Location: [facility];  Service: Ophthalmology;  Laterality: Right;  CDE:14.71  . CESAREAN SECTION    . CHOLECYSTECTOMY    . 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 DILATION AND CURETTAGE OF UTERUS    . FOOT NEUROMA SURGERY Right   . MOUTH SURGERY    . TONSILLECTOMY      Social History   Social History  . Marital status: Widowed    Spouse name: N/A  . Number of children: N/A  . Years of education: N/A   Occupational History  . Not on file.   Social History Main Topics  . Smoking status: Former Smoker    Types: Cigarettes  . Smokeless tobacco: Never Used     Comment: quit  16 year ago 01/10/17  . Alcohol use No  . Drug use: No  . Sexual activity: Not on file   Other Topics Concern  . Not on file   Social History Narrative  . No narrative on file     Vitals:   05/21/17 1150  BP: 115/72  Pulse: 73  SpO2: 94%  Weight: 249 lb 3.2 oz (113 kg)  Height: 5\' 2"  (1.575 m)    Wt Readings from Last 3 Encounters:  05/21/17 249 lb 3.2 oz (113 kg)  04/09/17 240 lb (108.9 kg)  01/10/17 247 lb (112 kg)     PHYSICAL EXAM General: NAD, using oxygen by nasal cannula HEENT: Normal. Neck: No JVD, no thyromegaly. Lungs: Clear to auscultation bilaterally with normal respiratory effort. CV: Nondisplaced PMI.  Regular rate and rhythm,  normal S1/S2, no S3/S4, no murmur. No pretibial or periankle edema.  No carotid bruit.   Abdomen: Soft, protuberant.   Neurologic: Alert and oriented.  Psych: Normal affect. Skin: Normal. Musculoskeletal: No gross deformities.    ECG: Most recent ECG reviewed.   Labs: Lab Results  Component Value Date/Time   K 4.3 06/09/2013 01:50 PM   BUN 16 06/09/2013 01:50 PM   CREATININE 0.96 06/09/2013 01:50 PM   HGB 13.6 06/09/2013 01:50 PM     Lipids: No results found for: LDLCALC, LDLDIRECT, CHOL, TRIG, HDL     ASSESSMENT AND PLAN:  1. Persistent atrial fibirllation:She continues to experience frequent palpitations. She was recently diagnosed with sleep apnea. She is taking Eliquis for anticoagulation. I will increase Toprol-XL to 75 mg q am and 50 mg q pm. I will reduce losartan to 50 mg daily in order to not potentiate hypotension.  2. Pulmonary hypertension:This was seen on CTA of the chest. Moderately elevated pulmonary pressures by echocardiography as detailed above. Likely due to COPD. Diastolic function was normal.  3. Coronary artery calcifications on CT/aortic atherosclerosis:Nuclear stress test in June 2013 demonstrated "probably normal perfusion". Cardiac function is normal. Continue beta blocker and statin.  4. Obstructive sleep apnea: She is to begin CPAP today. Adherence to CPAP should diminish frequency of paroxysms of rapid atrial fibrillation.      Disposition: Follow up 3 months   Kate Sable, M.D., F.A.C.C.

## 2017-05-31 DIAGNOSIS — R918 Other nonspecific abnormal finding of lung field: Secondary | ICD-10-CM | POA: Diagnosis not present

## 2017-06-06 ENCOUNTER — Encounter: Payer: Self-pay | Admitting: Family Medicine

## 2017-06-06 ENCOUNTER — Ambulatory Visit (INDEPENDENT_AMBULATORY_CARE_PROVIDER_SITE_OTHER): Payer: Medicare Other | Admitting: Family Medicine

## 2017-06-06 VITALS — BP 124/78 | HR 68 | Temp 97.3°F | Resp 18 | Ht 62.0 in | Wt 247.0 lb

## 2017-06-06 DIAGNOSIS — R0902 Hypoxemia: Secondary | ICD-10-CM

## 2017-06-06 DIAGNOSIS — G4733 Obstructive sleep apnea (adult) (pediatric): Secondary | ICD-10-CM | POA: Insufficient documentation

## 2017-06-06 DIAGNOSIS — J449 Chronic obstructive pulmonary disease, unspecified: Secondary | ICD-10-CM | POA: Diagnosis not present

## 2017-06-06 DIAGNOSIS — J301 Allergic rhinitis due to pollen: Secondary | ICD-10-CM

## 2017-06-06 DIAGNOSIS — J302 Other seasonal allergic rhinitis: Secondary | ICD-10-CM | POA: Insufficient documentation

## 2017-06-06 DIAGNOSIS — I270 Primary pulmonary hypertension: Secondary | ICD-10-CM | POA: Insufficient documentation

## 2017-06-06 DIAGNOSIS — I482 Chronic atrial fibrillation, unspecified: Secondary | ICD-10-CM

## 2017-06-06 DIAGNOSIS — E559 Vitamin D deficiency, unspecified: Secondary | ICD-10-CM

## 2017-06-06 DIAGNOSIS — E785 Hyperlipidemia, unspecified: Secondary | ICD-10-CM | POA: Diagnosis not present

## 2017-06-06 DIAGNOSIS — I4891 Unspecified atrial fibrillation: Secondary | ICD-10-CM | POA: Insufficient documentation

## 2017-06-06 DIAGNOSIS — Z1159 Encounter for screening for other viral diseases: Secondary | ICD-10-CM | POA: Diagnosis not present

## 2017-06-06 DIAGNOSIS — Z9981 Dependence on supplemental oxygen: Secondary | ICD-10-CM

## 2017-06-06 DIAGNOSIS — E538 Deficiency of other specified B group vitamins: Secondary | ICD-10-CM

## 2017-06-06 DIAGNOSIS — Z7901 Long term (current) use of anticoagulants: Secondary | ICD-10-CM | POA: Insufficient documentation

## 2017-06-06 DIAGNOSIS — Z23 Encounter for immunization: Secondary | ICD-10-CM

## 2017-06-06 DIAGNOSIS — I2510 Atherosclerotic heart disease of native coronary artery without angina pectoris: Secondary | ICD-10-CM | POA: Diagnosis not present

## 2017-06-06 DIAGNOSIS — I10 Essential (primary) hypertension: Secondary | ICD-10-CM | POA: Diagnosis not present

## 2017-06-06 DIAGNOSIS — Z9989 Dependence on other enabling machines and devices: Secondary | ICD-10-CM

## 2017-06-06 DIAGNOSIS — E1169 Type 2 diabetes mellitus with other specified complication: Secondary | ICD-10-CM

## 2017-06-06 NOTE — Progress Notes (Signed)
Chief Complaint  Patient presents with  . Atrial Fibrillation   New patient Her old physician passed away She has multiple complicated medical issues Well controlled hypertension and hyperlipidemia Diabetes, on metformin, unknown last A1c, sees podiatry, yearly eye exam, no known kidney disease Is on an ARB and a statin Morbid obesity COPD on oxygen.  Prior smoker.  compliant with inhalers On celexa for depression and anxiety.  It works well Old records are requested.  Cardiology notes are reviewed  Patient Active Problem List   Diagnosis Date Noted  . COPD with hypoxia ([place]) 06/06/2017  . Type 2 diabetes mellitus with other specified complication ([place]) 30/03/6225  . Hypertension 06/06/2017  . HLD (hyperlipidemia) 06/06/2017  . Atrial fibrillation ([place]) 06/06/2017  . Morbid obesity ([place]) 06/06/2017  . Current use of long term anticoagulation 06/06/2017  . OSA on CPAP 06/06/2017  . Oxygen dependent 06/06/2017  . Seasonal allergies 06/06/2017  . Pulmonary hypertension, primary ([place]) 06/06/2017  . CAD (coronary artery disease) 06/06/2017    Outpatient Encounter Prescriptions as of 06/06/2017  Medication Sig  . acetaminophen (TYLENOL) 500 MG tablet Take 500 mg by mouth daily as needed for pain.  Marland Kitchen albuterol (PROVENTIL HFA;VENTOLIN HFA) 108 (90 Base) MCG/ACT inhaler Inhale 1 puff into the lungs as needed for wheezing or shortness of breath.  Marland Kitchen apixaban (ELIQUIS) 5 MG TABS tablet Take 1 tablet (5 mg total) by mouth 2 (two) times daily.  . cetirizine (ZYRTEC) 10 MG tablet Take 10 mg by mouth daily.  . citalopram (CELEXA) 20 MG tablet Take 20 mg by mouth daily.  . Fluticasone-Salmeterol (ADVAIR DISKUS) 250-50 MCG/DOSE AEPB Inhale 1 puff into the lungs 2 (two) times daily.  . furosemide (LASIX) 20 MG tablet Take 1 tablet (20 mg total) by mouth as needed.  Marland Kitchen losartan (COZAAR) 50 MG tablet Take 1 tablet (50 mg total) by mouth daily.  . metFORMIN (GLUCOPHAGE) 1000 MG tablet Take  1,000 mg by mouth daily.  . metoprolol succinate (TOPROL-XL) 50 MG 24 hr tablet Take 1 1/2 tabs (75mg ) every morning & 1 tab (50mg ) every evening  . pravastatin (PRAVACHOL) 20 MG tablet Take 20 mg by mouth daily.  Marland Kitchen tiotropium (SPIRIVA) 18 MCG inhalation capsule Place 18 mcg into inhaler and inhale daily.   No facility-administered encounter medications on file as of 06/06/2017.     Allergies  Allergen Reactions  . Trazodone And Nefazodone Hives    "blisters my skin"   . Adhesive [Tape] Itching  . Bactrim [Sulfamethoxazole-Trimethoprim] Itching and Rash  . Penicillins Itching and Rash  . Ppd [Tuberculin Purified Protein Derivative] Swelling    Local arm swelling    Review of Systems  Constitutional: Positive for fatigue and unexpected weight change. Negative for activity change and appetite change.  HENT: Negative for congestion, dental problem, postnasal drip and rhinorrhea.   Eyes: Negative for redness and visual disturbance.  Respiratory: Positive for cough and shortness of breath.   Cardiovascular: Negative for chest pain, palpitations and leg swelling.  Gastrointestinal: Negative for abdominal pain, constipation and diarrhea.  Genitourinary: Negative for difficulty urinating, frequency and vaginal bleeding.  Musculoskeletal: Positive for arthralgias and gait problem. Negative for back pain.  Neurological: Negative for dizziness and headaches.  Psychiatric/Behavioral: Negative for dysphoric mood and sleep disturbance. The patient is not nervous/anxious.        Stable    BP 124/78 (BP Location: Right Arm, Patient Position: Sitting, Cuff Size: Normal)   Pulse 68   Temp Marland Kitchen)  97.3 F (36.3 C) (Temporal)   Resp 18   Ht 5\' 2"  (1.575 m)   Wt 247 lb 0.6 oz (112.1 kg)   SpO2 97% Comment: o2 2 lpm via n/c  BMI 45.18 kg/m   Physical Exam  Constitutional: She is oriented to person, place, and time. She appears well-developed and well-nourished.  HENT:  Head: Normocephalic and  atraumatic.  Mouth/Throat: Oropharynx is clear and moist.  Morbid obesity  Eyes: Pupils are equal, round, and reactive to light. Conjunctivae are normal.  glasses  Neck: Normal range of motion. Neck supple.  Cardiovascular: Normal rate, regular rhythm and normal heart sounds.   Sounds regualr  Pulmonary/Chest: Effort normal and breath sounds normal. She has no wheezes.  Abdominal: Soft. Bowel sounds are normal.  Musculoskeletal: Normal range of motion. She exhibits no edema.  Lymphadenopathy:    She has no cervical adenopathy.  Neurological: She is alert and oriented to person, place, and time.  Gait normal  Skin: Skin is warm and dry.  Psychiatric: She has a normal mood and affect. Her behavior is normal. Thought content normal.  Nursing note and vitals reviewed.   ASSESSMENT/PLAN:  1. Need for influenza vaccination - Flu Vaccine QUAD 36+ mos IM  2. COPD with hypoxia ([place])  3. Type 2 diabetes mellitus with other specified complication, without long-term current use of insulin (HCC) - CBC - COMPLETE METABOLIC PANEL WITH GFR - Hemoglobin A1c - Lipid panel - Microalbumin / creatinine urine ratio - Urinalysis, Routine w reflex microscopic - Ambulatory referral to diabetic education  4. Essential hypertension  5. Hyperlipidemia, unspecified hyperlipidemia type - Lipid panel  6. Chronic atrial fibrillation (HCC)  7. Morbid obesity ([place])  8. Oxygen dependent  9. Seasonal allergic rhinitis due to pollen  10. Encounter for hepatitis C screening test for low risk patient  11. Vitamin D deficiency - VITAMIN D 25 Hydroxy (Vit-D Deficiency, Fractures)  12. Vitamin B 12 deficiency - Vitamin B12   Patient Instructions  Need records Dr Wenda Overland Need records [facility] recent stay  Need blood work I will send you a letter with your test results.  If there is anything of concern, we will call right away.  See me for a PE in 4-6 weeks    [name], MD

## 2017-06-06 NOTE — Patient Instructions (Addendum)
Need records Dr Wenda Overland Need records [facility] recent stay  Need blood work I will send you a letter with your test results.  If there is anything of concern, we will call right away.  See me for a PE in 4-6 weeks

## 2017-06-07 LAB — URINALYSIS, ROUTINE W REFLEX MICROSCOPIC
Bilirubin Urine: NEGATIVE
Glucose, UA: NEGATIVE
Hgb urine dipstick: NEGATIVE
KETONES UR: NEGATIVE
Leukocytes, UA: NEGATIVE
NITRITE: NEGATIVE
Protein, ur: NEGATIVE
SPECIFIC GRAVITY, URINE: 1.018 (ref 1.001–1.03)
pH: 5.5 (ref 5.0–8.0)

## 2017-06-07 LAB — VITAMIN B12: VITAMIN B 12: 346 pg/mL (ref 200–1100)

## 2017-06-07 LAB — COMPLETE METABOLIC PANEL WITH GFR
AG Ratio: 1.4 (calc) (ref 1.0–2.5)
ALBUMIN MSPROF: 3.7 g/dL (ref 3.6–5.1)
ALT: 12 U/L (ref 6–29)
AST: 27 U/L (ref 10–35)
Alkaline phosphatase (APISO): 98 U/L (ref 33–130)
BUN/Creatinine Ratio: 16 (calc) (ref 6–22)
BUN: 16 mg/dL (ref 7–25)
CALCIUM: 9.2 mg/dL (ref 8.6–10.4)
CO2: 28 mmol/L (ref 20–32)
Chloride: 101 mmol/L (ref 98–110)
Creat: 1.02 mg/dL — ABNORMAL HIGH (ref 0.50–0.99)
GFR, EST NON AFRICAN AMERICAN: 56 mL/min/{1.73_m2} — AB (ref >=60)
GFR, Est African American: 65 mL/min/{1.73_m2} (ref >=60)
GLOBULIN: 2.6 g/dL (ref 1.9–3.7)
Glucose, Bld: 151 mg/dL — ABNORMAL HIGH (ref 65–99)
Potassium: 4.2 mmol/L (ref 3.5–5.3)
SODIUM: 137 mmol/L (ref 135–146)
Total Bilirubin: 1.1 mg/dL (ref 0.2–1.2)
Total Protein: 6.3 g/dL (ref 6.1–8.1)

## 2017-06-07 LAB — HEMOGLOBIN A1C
HEMOGLOBIN A1C: 6.8 %{Hb} — AB (ref <5.7)
Mean Plasma Glucose: 148 (calc)
eAG (mmol/L): 8.2 (calc)

## 2017-06-07 LAB — MICROALBUMIN / CREATININE URINE RATIO
Creatinine, Urine: 130 mg/dL (ref 20–275)
Microalb Creat Ratio: 5 mcg/mg creat (ref <30)
Microalb, Ur: 0.7 mg/dL

## 2017-06-07 LAB — LIPID PANEL
CHOL/HDL RATIO: 3 (calc) (ref <5.0)
Cholesterol: 140 mg/dL (ref <200)
HDL: 47 mg/dL — AB (ref >50)
LDL CHOLESTEROL (CALC): 71 mg/dL
NON-HDL CHOLESTEROL (CALC): 93 mg/dL (ref <130)
Triglycerides: 141 mg/dL (ref <150)

## 2017-06-07 LAB — CBC
HEMATOCRIT: 32.7 % — AB (ref 35.0–45.0)
HEMOGLOBIN: 9.6 g/dL — AB (ref 11.7–15.5)
MCH: 24.9 pg — ABNORMAL LOW (ref 27.0–33.0)
MCHC: 29.4 g/dL — ABNORMAL LOW (ref 32.0–36.0)
MCV: 84.9 fL (ref 80.0–100.0)
MPV: 12.5 fL (ref 7.5–12.5)
Platelets: 146 10*3/uL (ref 140–400)
RBC: 3.85 10*6/uL (ref 3.80–5.10)
RDW: 15.3 % — AB (ref 11.0–15.0)
WBC: 5.5 10*3/uL (ref 3.8–10.8)

## 2017-06-07 LAB — VITAMIN D 25 HYDROXY (VIT D DEFICIENCY, FRACTURES): Vit D, 25-Hydroxy: 23 ng/mL — ABNORMAL LOW (ref 30–100)

## 2017-06-07 LAB — HEPATITIS C ANTIBODY
HEP C AB: NONREACTIVE
SIGNAL TO CUT-OFF: 0.09 (ref <1.00)

## 2017-06-12 ENCOUNTER — Encounter: Payer: Self-pay | Admitting: Family Medicine

## 2017-06-12 DIAGNOSIS — D649 Anemia, unspecified: Secondary | ICD-10-CM

## 2017-07-12 ENCOUNTER — Encounter: Payer: Medicare Other | Attending: Family Medicine | Admitting: Nutrition

## 2017-07-12 VITALS — Ht 62.5 in | Wt 255.0 lb

## 2017-07-12 DIAGNOSIS — E1165 Type 2 diabetes mellitus with hyperglycemia: Secondary | ICD-10-CM

## 2017-07-12 DIAGNOSIS — E1169 Type 2 diabetes mellitus with other specified complication: Secondary | ICD-10-CM | POA: Diagnosis not present

## 2017-07-12 DIAGNOSIS — E669 Obesity, unspecified: Secondary | ICD-10-CM

## 2017-07-12 DIAGNOSIS — IMO0002 Reserved for concepts with insufficient information to code with codable children: Secondary | ICD-10-CM

## 2017-07-12 DIAGNOSIS — E118 Type 2 diabetes mellitus with unspecified complications: Secondary | ICD-10-CM

## 2017-07-12 DIAGNOSIS — Z713 Dietary counseling and surveillance: Secondary | ICD-10-CM | POA: Diagnosis not present

## 2017-07-12 NOTE — Patient Instructions (Signed)
Goals 1. Follow My Plate Method 2. Eat 2-3 carb choices per meal. Do not skip meal 3. Increase 2 svg of vegetables with lunch and dinner 4  Drink only water 5. Walk  As tolerated daily.

## 2017-07-12 NOTE — Progress Notes (Signed)
  Medical Nutrition Therapy:  Appt start time: 1330 end time:  1500.   Assessment:  Primary concerns today: Diabetes Type 2 DM.  DM x 8 yrs. Just started seeing Dr. Meda Coffee. Metformin 1000 mg a day. Testing 1-2 times a week.  This is high highest she has ever weighed.    Just diagnosed with AFIB in April 2018. Wears oxygen since April 2018. Has COPD.  She does her own cooking and shopping. Most foods are baked, broiled and fried. Eats 2 meals per day. Skips breakfast often or supper if eaten a large lunch. Eats out 3-4 times a week. Mostly restaurants. Drinks 2-3 diet sodas per week. LIkes to eat ice as well. FBS: 110-130's.  DM  Meds: Metformin 1000 mg daily.   States she is motivated to make changes with her diet to lose weight and improve blood sugars..  Lab Results  Component Value Date   HGBA1C 6.8 (H) 06/06/2017     Preferred Learning Style:   No preference indicated   Learning Readiness:  Ready  Change in progress   MEDICATIONS: see list   DIETARY INTAKE:  24-hr recall:  B ( AM):  Poptart, pb crackers ritz-20, ice water Snk ( AM):  Pop corn, water  L ( PM): skipped  Snk ( PM): popcorn skinny girl D ( PM): PB on titz 20 with PB, water Snk ( PM): toosie pop 2 Beverages: water  Usual physical activity: ADL  Estimated energy needs: 1200  calories 135  g carbohydrates 90 g protein 33 g fat  Progress Towards Goal(s):  In progress.   Nutritional Diagnosis:  NB-1.1 Food and nutrition-related knowledge deficit As related to Diabetes.  As evidenced by A1C 6.8%..    Intervention: Nutrition and Diabetes education provided on My Plate, CHO counting, meal planning, portion sizes, timing of meals, avoiding snacks between meals unless having a low blood sugar, target ranges for A1C and blood sugars, signs/symptoms and treatment of hyper/hypoglycemia, monitoring blood sugars, taking medications as prescribed, benefits of exercising 30 minutes per day and prevention of  complications of DM.   Goals 1. Follow My Plate Method 2. Eat 2-3 carb choices per meal. Do not skip meal 3. Increase 2 svg of vegetables with lunch and dinner 4  Drink only water 5. Walk  As tolerated daily.  Teaching Method Utilized:   Visual Auditory Hands on  Handouts given during visit include:  The Plate Method  Meal Plan  Diabetes Instructions.  Barriers to learning/adherence to lifestyle change: none  Demonstrated degree of understanding via:  Teach Back   Monitoring/Evaluation:  Dietary intake, exercise, meal planning, and body weight in 1 month(s).

## 2017-07-13 ENCOUNTER — Ambulatory Visit (INDEPENDENT_AMBULATORY_CARE_PROVIDER_SITE_OTHER): Payer: Medicare Other | Admitting: Family Medicine

## 2017-07-13 ENCOUNTER — Encounter: Payer: Self-pay | Admitting: Family Medicine

## 2017-07-13 VITALS — BP 128/78 | HR 60 | Temp 97.5°F | Resp 20 | Ht 62.0 in | Wt 253.1 lb

## 2017-07-13 DIAGNOSIS — Z1231 Encounter for screening mammogram for malignant neoplasm of breast: Secondary | ICD-10-CM | POA: Diagnosis not present

## 2017-07-13 DIAGNOSIS — Z1239 Encounter for other screening for malignant neoplasm of breast: Secondary | ICD-10-CM

## 2017-07-13 DIAGNOSIS — E2839 Other primary ovarian failure: Secondary | ICD-10-CM

## 2017-07-13 DIAGNOSIS — D649 Anemia, unspecified: Secondary | ICD-10-CM | POA: Diagnosis not present

## 2017-07-13 DIAGNOSIS — I10 Essential (primary) hypertension: Secondary | ICD-10-CM | POA: Diagnosis not present

## 2017-07-13 DIAGNOSIS — E538 Deficiency of other specified B group vitamins: Secondary | ICD-10-CM

## 2017-07-13 DIAGNOSIS — E1169 Type 2 diabetes mellitus with other specified complication: Secondary | ICD-10-CM

## 2017-07-13 DIAGNOSIS — I482 Chronic atrial fibrillation, unspecified: Secondary | ICD-10-CM

## 2017-07-13 NOTE — Patient Instructions (Signed)
Need mammogram Need dexa scan  Need additional blood work for anemia  Will need to see GI doctor if another colonoscopy needed  See me every 3 months

## 2017-07-13 NOTE — Progress Notes (Signed)
Chief Complaint  Patient presents with  . Annual Exam   Patient is here for physical examination. She is 71 years old and does not need a Pap smear. She is due for mammogram.  She has never had a DEXA scan so this is ordered as well. Her last colonoscopy was in 2008.  She is due for another.  She states she had a negative home Hemoccults done last year. Her recent labs were reviewed with her.  Diabetes is well controlled.  Lipids are well controlled.  Her blood pressure is good today. She has a new finding of anemia.  Her hemoglobin is down to 9.6 with microcytic indices.  She has had no GI bleeding.  I am going to go ahead and get iron and B12 studies on her today, repeat the CBC, and consider referring her to gastroenterology for consultation. Her vitamin D was slightly low at 23.  We discussed vitamin D replacement She has lost 2 pounds and is congratulated. She does not exercise because of her severe COPD, hypoxia, and oxygen dependency. She did go see the nutrition specialist to see about a diet because she understands that she needs to lose weight. Patient Active Problem List   Diagnosis Date Noted  . COPD with hypoxia ([place]) 06/06/2017  . Type 2 diabetes mellitus with other specified complication ([place]) 64/40/3474  . Hypertension 06/06/2017  . HLD (hyperlipidemia) 06/06/2017  . Atrial fibrillation ([place]) 06/06/2017  . Morbid obesity ([place]) 06/06/2017  . Current use of long term anticoagulation 06/06/2017  . OSA on CPAP 06/06/2017  . Oxygen dependent 06/06/2017  . Seasonal allergies 06/06/2017  . Pulmonary hypertension, primary ([place]) 06/06/2017  . CAD (coronary artery disease) 06/06/2017    Outpatient Encounter Prescriptions as of 07/13/2017  Medication Sig  . acetaminophen (TYLENOL) 500 MG tablet Take 500 mg by mouth daily as needed for pain.  Marland Kitchen albuterol (PROVENTIL HFA;VENTOLIN HFA) 108 (90 Base) MCG/ACT inhaler Inhale 1 puff into the lungs as needed for wheezing or shortness  of breath.  Marland Kitchen apixaban (ELIQUIS) 5 MG TABS tablet Take 1 tablet (5 mg total) by mouth 2 (two) times daily.  . cetirizine (ZYRTEC) 10 MG tablet Take 10 mg by mouth daily.  . cholecalciferol (VITAMIN D) 1000 units tablet Take 2,000 Units by mouth daily.  . citalopram (CELEXA) 20 MG tablet Take 20 mg by mouth daily.  . Fluticasone-Salmeterol (ADVAIR DISKUS) 250-50 MCG/DOSE AEPB Inhale 1 puff into the lungs 2 (two) times daily.  Marland Kitchen losartan (COZAAR) 50 MG tablet Take 1 tablet (50 mg total) by mouth daily.  . metFORMIN (GLUCOPHAGE) 1000 MG tablet Take 1,000 mg by mouth daily.  . metoprolol succinate (TOPROL-XL) 50 MG 24 hr tablet Take 1 1/2 tabs (75mg ) every morning & 1 tab (50mg ) every evening  . pravastatin (PRAVACHOL) 20 MG tablet Take 20 mg by mouth daily.  Marland Kitchen tiotropium (SPIRIVA) 18 MCG inhalation capsule Place 18 mcg into inhaler and inhale daily.  . furosemide (LASIX) 20 MG tablet Take 1 tablet (20 mg total) by mouth as needed. (Patient not taking: Reported on 07/13/2017)   No facility-administered encounter medications on file as of 07/13/2017.     Past Medical History:  Diagnosis Date  . Allergy   . Anxiety   . Arthritis   . Cataract   . COPD (chronic obstructive pulmonary disease) ([place])   . Depression   . Diabetes mellitus without complication ([place])   . Emphysema of lung ([place])   . Hyperlipidemia   .  Hypertension   . Oxygen deficiency   . Sleep apnea     Past Surgical History:  Procedure Laterality Date  . ABDOMINAL HYSTERECTOMY     fibroids  . CARPAL TUNNEL RELEASE    . CATARACT EXTRACTION W/PHACO Left 06/16/2013   Procedure: CATARACT EXTRACTION PHACO AND INTRAOCULAR LENS PLACEMENT (IOC);  Surgeon: Tonny Branch, MD;  Location: [facility];  Service: Ophthalmology;  Laterality: Left;  CDE:  12.18  . CATARACT EXTRACTION W/PHACO Right 06/26/2013   Procedure: CATARACT EXTRACTION PHACO AND INTRAOCULAR LENS PLACEMENT (IOC);  Surgeon: Tonny Branch, MD;  Location: [facility];  Service:  Ophthalmology;  Laterality: Right;  CDE:14.71  . CESAREAN SECTION    . CHOLECYSTECTOMY    . 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 DILATION AND CURETTAGE OF UTERUS    . FOOT NEUROMA SURGERY Right   . MOUTH SURGERY    . TONSILLECTOMY      Social History   Social History  . Marital status: Widowed    Spouse name: N/A  . Number of children: 1  . Years of education: 2   Occupational History  . retired     Primary school teacher, Engineer, maintenance   Social History Main Topics  . Smoking status: Former Smoker    Packs/day: 1.50    Types: Cigarettes    Start date: 09/25/1958    Quit date: 06/06/2001  . Smokeless tobacco: Never Used  . Alcohol use No  . Drug use: No  . Sexual activity: Not Currently   Other Topics Concern  . Not on file   Social History Narrative   Lives alone   Son lives in [place]   TV, reads, puzzles    Family History  Problem Relation Age of Onset  . Emphysema Mother   . Alcohol abuse Mother   . Arthritis Mother   . COPD Mother   . Depression Mother   . Hyperlipidemia Mother   . Hypertension Mother   . Heart attack Father   . Cancer Father        lung  . Heart disease Father   . Diabetes Brother   . COPD Brother   . Other Maternal Grandmother        swine flu 1919    Review of Systems  Constitutional: Negative for chills, fever and weight loss.  HENT: Negative for congestion and hearing loss.   Eyes: Negative for blurred vision and pain.  Respiratory: Positive for shortness of breath and wheezing. Negative for cough.   Cardiovascular: Positive for leg swelling. Negative for chest pain.  Gastrointestinal: Negative for abdominal pain, constipation, diarrhea and heartburn.  Genitourinary: Negative for dysuria and frequency.  Musculoskeletal: Negative for falls, joint pain and myalgias.  Neurological: Negative for dizziness, seizures and headaches.  Psychiatric/Behavioral: Negative for depression. The patient is not  nervous/anxious and does not have insomnia.     BP 128/78 (BP Location: Right Arm, Patient Position: Sitting, Cuff Size: Normal)   Pulse 60   Temp (!) 97.5 F (36.4 C) (Temporal)   Resp 20   Ht 5\' 2"  (1.575 m)   Wt 253 lb 1.3 oz (114.8 kg)   SpO2 94% Comment: room air  BMI 46.29 kg/m   Physical Exam  BP 128/78 (BP Location: Right Arm, Patient Position: Sitting, Cuff Size: Normal)   Pulse 60   Temp (!) 97.5 F (36.4 C) (Temporal)   Resp 20   Ht 5\' 2"  (1.575 m)   Wt 253 lb  1.3 oz (114.8 kg)   SpO2 94% Comment: room air  BMI 46.29 kg/m   General Appearance:    Alert, cooperative, no distress, appears stated age.  Morbidly obese.  Oxygen dependent.  Head:    Normocephalic, without obvious abnormality, atraumatic  Eyes:    PERRL, conjunctiva/corneas clear, EOM's intact, fundi    benign, both eyes  Ears:    Normal TM's and external ear canals, both ears  Nose:   Nares normal, septum midline, mucosa normal, no drainage    or sinus tenderness  Throat:   Lips, mucosa, and tongue normal; teeth and gums normal  Neck:   Supple, symmetrical, trachea midline, no adenopathy;    thyroid:  no enlargement/tenderness/nodules; no carotid   bruit .  Posterior scar left neck to scalp from craniotomy  Back:     Symmetric, no curvature, ROM normal, no CVA tenderness  Lungs:     Clear to auscultation bilaterally, respirations unlabored  Chest Wall:    No tenderness or deformity   Heart:    Regular rate and rhythm, S1 and S2 normal, no murmur, rub   or gallop  Breast Exam:    No tenderness, masses, or nipple abnormality.  Mild palpable nodularity left  Abdomen:     Soft, non-tender, bowel sounds active all four quadrants,    no masses, no organomegaly.  Large well-healed gallbladder scar  Extremities:   Extremities normal, atraumatic, no cyanosis , 1+ edema  Pulses:   2+ and symmetric all extremities  Skin:   Skin color, texture, turgor normal, no rashes or lesions  Lymph nodes:   Cervical,  supraclavicular, and axillary nodes normal  Neurologic:    normal strength, sensation and reflexes    throughout    ASSESSMENT/PLAN:  1. Chronic atrial fibrillation (HCC) Well-controlled  2. Essential hypertension Well-controlled  3. Anemia, unspecified type New onset - IBC panel - CBC with Differential/Platelet  4. Screening for breast cancer Yearly screening is due - MM Digital Screening; Future  5. Estrogen deficiency Patient's never had DEXA scan, it is recommended - DG Bone Density; Future  6. Type 2 diabetes mellitus with other specified complication, without long-term current use of insulin (HCC) Well-controlled.  A1c under 7  7. B12 deficiency Anemia workup - Vitamin B12   Patient Instructions  Need mammogram Need dexa scan  Need additional blood work for anemia  Will need to see GI doctor if another colonoscopy needed  See me every 3 months   [name], MD

## 2017-07-14 LAB — CBC WITH DIFFERENTIAL/PLATELET
BASOS ABS: 29 {cells}/uL (ref 0–200)
Basophils Relative: 0.5 %
Eosinophils Absolute: 120 cells/uL (ref 15–500)
Eosinophils Relative: 2.1 %
HCT: 32.1 % — ABNORMAL LOW (ref 35.0–45.0)
Hemoglobin: 9.5 g/dL — ABNORMAL LOW (ref 11.7–15.5)
Lymphs Abs: 998 cells/uL (ref 850–3900)
MCH: 24.7 pg — AB (ref 27.0–33.0)
MCHC: 29.6 g/dL — ABNORMAL LOW (ref 32.0–36.0)
MCV: 83.4 fL (ref 80.0–100.0)
MONOS PCT: 8.5 %
MPV: 12.9 fL — ABNORMAL HIGH (ref 7.5–12.5)
NEUTROS ABS: 4070 {cells}/uL (ref 1500–7800)
NEUTROS PCT: 71.4 %
PLATELETS: 166 10*3/uL (ref 140–400)
RBC: 3.85 10*6/uL (ref 3.80–5.10)
RDW: 15.7 % — AB (ref 11.0–15.0)
TOTAL LYMPHOCYTE: 17.5 %
WBC mixed population: 485 cells/uL (ref 200–950)
WBC: 5.7 10*3/uL (ref 3.8–10.8)

## 2017-07-14 LAB — IRON, TOTAL/TOTAL IRON BINDING CAP
%SAT: 12 % (ref 11–50)
Iron: 53 ug/dL (ref 45–160)
TIBC: 441 ug/dL (ref 250–450)

## 2017-07-14 LAB — VITAMIN B12: Vitamin B-12: 379 pg/mL (ref 200–1100)

## 2017-07-16 ENCOUNTER — Encounter: Payer: Self-pay | Admitting: Family Medicine

## 2017-07-16 DIAGNOSIS — D509 Iron deficiency anemia, unspecified: Secondary | ICD-10-CM

## 2017-07-17 DIAGNOSIS — R0602 Shortness of breath: Secondary | ICD-10-CM | POA: Diagnosis not present

## 2017-07-17 DIAGNOSIS — R918 Other nonspecific abnormal finding of lung field: Secondary | ICD-10-CM | POA: Diagnosis not present

## 2017-07-17 DIAGNOSIS — J9612 Chronic respiratory failure with hypercapnia: Secondary | ICD-10-CM | POA: Diagnosis not present

## 2017-07-17 DIAGNOSIS — Z87891 Personal history of nicotine dependence: Secondary | ICD-10-CM | POA: Diagnosis not present

## 2017-07-17 DIAGNOSIS — J449 Chronic obstructive pulmonary disease, unspecified: Secondary | ICD-10-CM | POA: Diagnosis not present

## 2017-07-17 DIAGNOSIS — J984 Other disorders of lung: Secondary | ICD-10-CM | POA: Diagnosis not present

## 2017-07-17 DIAGNOSIS — G4730 Sleep apnea, unspecified: Secondary | ICD-10-CM | POA: Diagnosis not present

## 2017-07-17 DIAGNOSIS — R0689 Other abnormalities of breathing: Secondary | ICD-10-CM | POA: Diagnosis not present

## 2017-07-19 DIAGNOSIS — E1140 Type 2 diabetes mellitus with diabetic neuropathy, unspecified: Secondary | ICD-10-CM | POA: Diagnosis not present

## 2017-07-19 DIAGNOSIS — M79671 Pain in right foot: Secondary | ICD-10-CM | POA: Diagnosis not present

## 2017-07-19 DIAGNOSIS — B351 Tinea unguium: Secondary | ICD-10-CM | POA: Diagnosis not present

## 2017-07-19 DIAGNOSIS — M792 Neuralgia and neuritis, unspecified: Secondary | ICD-10-CM | POA: Diagnosis not present

## 2017-07-24 ENCOUNTER — Telehealth: Payer: Self-pay | Admitting: Cardiovascular Disease

## 2017-07-24 NOTE — Telephone Encounter (Signed)
Mrs Trine called stating that her pulse is very low.. States that her pulse was 63 .

## 2017-07-24 NOTE — Telephone Encounter (Signed)
Oxygen was 97% - heart rate was 63.  Stated that she got confused about the numbers.  Advised that Heart rate 60-100 is normal.  She verbalized understanding.

## 2017-07-25 ENCOUNTER — Encounter (HOSPITAL_COMMUNITY): Payer: Medicare Other

## 2017-07-25 ENCOUNTER — Encounter (HOSPITAL_COMMUNITY): Payer: Self-pay | Admitting: Oncology

## 2017-07-25 ENCOUNTER — Encounter (HOSPITAL_COMMUNITY): Payer: Medicare Other | Attending: Oncology | Admitting: Oncology

## 2017-07-25 DIAGNOSIS — D649 Anemia, unspecified: Secondary | ICD-10-CM | POA: Diagnosis not present

## 2017-07-25 HISTORY — DX: Anemia, unspecified: D64.9

## 2017-07-25 LAB — VITAMIN B12: Vitamin B-12: 314 pg/mL (ref 180–914)

## 2017-07-25 LAB — COMPREHENSIVE METABOLIC PANEL
ALT: 20 U/L (ref 14–54)
AST: 36 U/L (ref 15–41)
Albumin: 3.6 g/dL (ref 3.5–5.0)
Alkaline Phosphatase: 99 U/L (ref 38–126)
Anion gap: 8 (ref 5–15)
BUN: 11 mg/dL (ref 6–20)
CALCIUM: 9.3 mg/dL (ref 8.9–10.3)
CO2: 29 mmol/L (ref 22–32)
CREATININE: 0.97 mg/dL (ref 0.44–1.00)
Chloride: 99 mmol/L — ABNORMAL LOW (ref 101–111)
GFR calc Af Amer: >60 mL/min (ref >60)
GFR, EST NON AFRICAN AMERICAN: 58 mL/min — AB (ref >60)
GLUCOSE: 146 mg/dL — AB (ref 65–99)
POTASSIUM: 4.6 mmol/L (ref 3.5–5.1)
Sodium: 136 mmol/L (ref 135–145)
TOTAL PROTEIN: 7 g/dL (ref 6.5–8.1)
Total Bilirubin: 1.9 mg/dL — ABNORMAL HIGH (ref 0.3–1.2)

## 2017-07-25 LAB — CBC WITH DIFFERENTIAL/PLATELET
BASOS ABS: 0 10*3/uL (ref 0.0–0.1)
Basophils Relative: 0 %
Eosinophils Absolute: 0.1 10*3/uL (ref 0.0–0.7)
Eosinophils Relative: 2 %
HCT: 33 % — ABNORMAL LOW (ref 36.0–46.0)
Hemoglobin: 9.5 g/dL — ABNORMAL LOW (ref 12.0–15.0)
LYMPHS ABS: 0.6 10*3/uL — AB (ref 0.7–4.0)
Lymphocytes Relative: 14 %
MCH: 25.1 pg — ABNORMAL LOW (ref 26.0–34.0)
MCHC: 28.8 g/dL — ABNORMAL LOW (ref 30.0–36.0)
MCV: 87.3 fL (ref 78.0–100.0)
MONO ABS: 0.3 10*3/uL (ref 0.1–1.0)
Monocytes Relative: 7 %
Neutro Abs: 3.6 10*3/uL (ref 1.7–7.7)
Neutrophils Relative %: 77 %
PLATELETS: 129 10*3/uL — AB (ref 150–400)
RBC: 3.78 MIL/uL — AB (ref 3.87–5.11)
RDW: 16.7 % — AB (ref 11.5–15.5)
WBC: 4.6 10*3/uL (ref 4.0–10.5)

## 2017-07-25 LAB — RETICULOCYTES
RBC.: 3.78 MIL/uL — AB (ref 3.87–5.11)
RETIC COUNT ABSOLUTE: 121 10*3/uL (ref 19.0–186.0)
RETIC CT PCT: 3.2 % — AB (ref 0.4–3.1)

## 2017-07-25 LAB — FERRITIN: Ferritin: 9 ng/mL — ABNORMAL LOW (ref 11–307)

## 2017-07-25 LAB — IRON AND TIBC
IRON: 60 ug/dL (ref 28–170)
Saturation Ratios: 13 % (ref 10.4–31.8)
TIBC: 465 ug/dL — ABNORMAL HIGH (ref 250–450)
UIBC: 405 ug/dL

## 2017-07-25 LAB — LACTATE DEHYDROGENASE: LDH: 137 U/L (ref 98–192)

## 2017-07-25 LAB — FOLATE: FOLATE: 8 ng/mL (ref >5.9)

## 2017-07-25 NOTE — Patient Instructions (Signed)
[place] at [facility] Discharge Instructions  RECOMMENDATIONS MADE BY THE CONSULTANT AND ANY TEST RESULTS WILL BE SENT TO YOUR REFERRING PHYSICIAN.  You were seen today by Dr. Twana First Follow up next week We will do lab work today We will refer you to Dr. Oneida Alar for colonoscopy   Thank you for choosing [place] at [facility] to provide your oncology and hematology care.  To afford each patient quality time with our provider, please arrive at least 15 minutes before your scheduled appointment time.    If you have a lab appointment with the [facility] please come in thru the  Main Entrance and check in at the main information desk  You need to re-schedule your appointment should you arrive 10 or more minutes late.  We strive to give you quality time with our providers, and arriving late affects you and other patients whose appointments are after yours.  Also, if you no show three or more times for appointments you may be dismissed from the clinic at the providers discretion.     Again, thank you for choosing [facility].  Our hope is that these requests will decrease the amount of time that you wait before being seen by our physicians.       _____________________________________________________________  Should you have questions after your visit to [facility], please contact our office at [phone] between the hours of 8:30 a.m. and 4:30 p.m.  Voicemails left after 4:30 p.m. will not be returned until the following business day.  For prescription refill requests, have your pharmacy contact our office.       Resources For Cancer Patients and their Caregivers ? American Cancer Society: Can assist with transportation, wigs, general needs, runs Look Good Feel Better.        2315020232 ? Cancer Care: Provides financial assistance, online support groups, medication/co-pay assistance.  1-800-813-HOPE  310-677-9217) ? [place] Assists [place] cancer patients and their families through emotional , educational and financial support.  661-813-9940 ? [place] DSS Where to apply for food stamps, Medicaid and utility assistance. 610-044-4011 ? [facility]: Transportation to medical appointments. 972-154-9180 ? Social Security Administration: May apply for disability if have a Stage IV cancer. (484) 384-3801 417-337-0348 ? [employer], Disability and Transit Services: Assists with nutrition, care and transit needs. [place] Support Programs: @10RELATIVEDAYS @ > Cancer Support Group  2nd Tuesday of the month 1pm-2pm, Journey Room  > Creative Journey  3rd Tuesday of the month 1130am-1pm, Journey Room  > Look Good Feel Better  1st Wednesday of the month 10am-12 noon, Journey Room (Call [place] to register [phone])

## 2017-07-25 NOTE — Progress Notes (Signed)
[facility] Initial Visit:  Patient Care Team: [name], MD as PCP - General (Family Medicine) Herminio Commons, MD as Attending Physician (Cardiology) Sandford Craze, MD as Referring Physician (Dermatology) Cristal Deer, DPM as Attending Physician (Podiatry)  CHIEF COMPLAINTS/PURPOSE OF CONSULTATION:  Anemia  HISTORY OF PRESENTING ILLNESS: Linda [name] 71 y.o. female presents here for evaluation of anemia.  Most recent CBC from 07/13/2017 demonstrated WBC 5.7K, hemoglobin 9.5 g/dL, hematocrit 30.1%, MCV 83.4, platelet count 166K.  Previous hemoglobin from 06/06/2017 was 9.6 g/dL.  Iron studies from 07/13/2017 demonstrated iron of 53, TIBC 441, percent saturation 12%.  She states that she has been feeling fatigued.  She denies any bleeding including hematuria, hemoptysis, hematochezia, melena.  Her last colonoscopy was more than 5-10 years ago and she said that at that time she is found to have 1 polyp which was removed.  She has chronic shortness of breath and uses oxygen at night.  She denies any chest pain, palpitations, leg swelling, abdominal pain, constipation, nausea, vomiting.  She has occasional diarrhea from metformin.  She has neuropathy in her feet.  She denies any recent infections or fevers or chills.  She has no pain. Patient states that she has ice cravings all the time.   Review of Systems - Oncology ROS as per HPI otherwise 12 point ROS is negative.  MEDICAL HISTORY: Past Medical History:  Diagnosis Date  . Allergy   . Anemia 07/25/2017  . Anxiety   . Arthritis   . Cataract   . COPD (chronic obstructive pulmonary disease) ([place])   . Depression   . Diabetes mellitus without complication ([place])   . Emphysema of lung ([place])   . Hyperlipidemia   . Hypertension   . Oxygen deficiency   . Sleep apnea     SURGICAL HISTORY: Past Surgical History:  Procedure Laterality Date  . ABDOMINAL HYSTERECTOMY     fibroids  . CARPAL  TUNNEL RELEASE    . CATARACT EXTRACTION W/PHACO Left 06/16/2013   Procedure: CATARACT EXTRACTION PHACO AND INTRAOCULAR LENS PLACEMENT (IOC);  Surgeon: Tonny Branch, MD;  Location: [facility];  Service: Ophthalmology;  Laterality: Left;  CDE:  12.18  . CATARACT EXTRACTION W/PHACO Right 06/26/2013   Procedure: CATARACT EXTRACTION PHACO AND INTRAOCULAR LENS PLACEMENT (IOC);  Surgeon: Tonny Branch, MD;  Location: [facility];  Service: Ophthalmology;  Laterality: Right;  CDE:14.71  . CESAREAN SECTION    . CHOLECYSTECTOMY    . 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 DILATION AND CURETTAGE OF UTERUS    . FOOT NEUROMA SURGERY Right   . MOUTH SURGERY    . TONSILLECTOMY      SOCIAL HISTORY: Social History   Social History  . Marital status: Widowed    Spouse name: N/A  . Number of children: 1  . Years of education: 74   Occupational History  . retired     Primary school teacher, Engineer, maintenance   Social History Main Topics  . Smoking status: Former Smoker    Packs/day: 1.50    Types: Cigarettes    Start date: 09/25/1958    Quit date: 06/06/2001  . Smokeless tobacco: Never Used  . Alcohol use No  . Drug use: No  . Sexual activity: Not Currently   Other Topics Concern  . Not on file   Social History Narrative   Lives alone   Son lives in [place], reads, puzzles  FAMILY HISTORY Family History  Problem Relation Age of Onset  . Emphysema Mother   . Alcohol abuse Mother   . Arthritis Mother   . COPD Mother   . Depression Mother   . Hyperlipidemia Mother   . Hypertension Mother   . Heart attack Father   . Cancer Father        lung  . Heart disease Father   . Diabetes Brother   . COPD Brother   . Other Maternal Grandmother        swine flu 1919    ALLERGIES:  is allergic to trazodone and nefazodone; adhesive [tape]; bactrim [sulfamethoxazole-trimethoprim]; penicillins; and ppd [tuberculin purified protein derivative].  MEDICATIONS:  Current Outpatient  Prescriptions  Medication Sig Dispense Refill  . acetaminophen (TYLENOL) 500 MG tablet Take 500 mg by mouth daily as needed for pain.    Marland Kitchen albuterol (PROVENTIL HFA;VENTOLIN HFA) 108 (90 Base) MCG/ACT inhaler Inhale 1 puff into the lungs as needed for wheezing or shortness of breath.    Marland Kitchen apixaban (ELIQUIS) 5 MG TABS tablet Take 1 tablet (5 mg total) by mouth 2 (two) times daily. 180 tablet 3  . cetirizine (ZYRTEC) 10 MG tablet Take 10 mg by mouth daily.    . cholecalciferol (VITAMIN D) 1000 units tablet Take 2,000 Units by mouth daily.    . citalopram (CELEXA) 20 MG tablet Take 20 mg by mouth daily.    . Fluticasone-Salmeterol (ADVAIR DISKUS) 250-50 MCG/DOSE AEPB Inhale 1 puff into the lungs 2 (two) times daily.    . furosemide (LASIX) 20 MG tablet Take 1 tablet (20 mg total) by mouth as needed. (Patient not taking: Reported on 07/13/2017) 30 tablet 1  . losartan (COZAAR) 50 MG tablet Take 1 tablet (50 mg total) by mouth daily.    . metFORMIN (GLUCOPHAGE) 1000 MG tablet Take 1,000 mg by mouth daily.    . metoprolol succinate (TOPROL-XL) 50 MG 24 hr tablet Take 1 1/2 tabs (75mg ) every morning & 1 tab (50mg ) every evening 225 tablet 3  . pravastatin (PRAVACHOL) 20 MG tablet Take 20 mg by mouth daily.    Marland Kitchen tiotropium (SPIRIVA) 18 MCG inhalation capsule Place 18 mcg into inhaler and inhale daily.     No current facility-administered medications for this visit.     PHYSICAL EXAMINATION:  Physical Exam Constitutional: Well-developed, well-nourished, and in no distress.   HENT:  Head: Normocephalic and atraumatic.  Mouth/Throat: No oropharyngeal exudate. Mucosa moist. Eyes: Pupils are equal, round, and reactive to light. Conjunctivae are normal. No scleral icterus.  Neck: Normal range of motion. Neck supple. No JVD present.  Cardiovascular: Normal rate, regular rhythm and normal heart sounds.  Exam reveals no gallop and no friction rub.   No murmur heard. Pulmonary/Chest: Effort normal and  breath sounds normal. No respiratory distress. No wheezes.No rales.  Abdominal: Soft. Bowel sounds are normal. No distension. There is no tenderness. There is no guarding.  Musculoskeletal: No edema or tenderness.  Lymphadenopathy:    No cervical or supraclavicular adenopathy.  Neurological: Alert and oriented to person, place, and time. No cranial nerve deficit.  Skin: Skin is warm and dry. No rash noted. No erythema. No pallor.  Psychiatric: Affect and judgment normal.   LABORATORY DATA: I have personally reviewed the data as listed:  Office Visit on 07/13/2017  Component Date Value Ref Range Status  . Vitamin B-12 07/13/2017 379  200 - 1,100 pg/mL Final   Comment: . Please Note: Although the reference range for  vitamin B12 is [phone] pg/mL, it has been reported that between 5 and 10% of patients with values between 200 and 400 pg/mL may experience neuropsychiatric and hematologic abnormalities due to occult B12 deficiency; less than 1% of patients with values above 400 pg/mL will have symptoms. .   . WBC 07/13/2017 5.7  3.8 - 10.8 Thousand/uL Final  . RBC 07/13/2017 3.85  3.80 - 5.10 Million/uL Final  . Hemoglobin 07/13/2017 9.5* 11.7 - 15.5 g/dL Final  . HCT 07/13/2017 32.1* 35.0 - 45.0 % Final  . MCV 07/13/2017 83.4  80.0 - 100.0 fL Final  . MCH 07/13/2017 24.7* 27.0 - 33.0 pg Final  . MCHC 07/13/2017 29.6* 32.0 - 36.0 g/dL Final  . RDW 07/13/2017 15.7* 11.0 - 15.0 % Final  . Platelets 07/13/2017 166  140 - 400 Thousand/uL Final  . MPV 07/13/2017 12.9* 7.5 - 12.5 fL Final  . Neutro Abs 07/13/2017 4070  1,500 - 7,800 cells/uL Final  . Lymphs Abs 07/13/2017 998  850 - 3,900 cells/uL Final  . WBC mixed population 07/13/2017 485  200 - 950 cells/uL Final  . Eosinophils Absolute 07/13/2017 120  15 - 500 cells/uL Final  . Basophils Absolute 07/13/2017 29  0 - 200 cells/uL Final  . Neutrophils Relative % 07/13/2017 71.4  % Final  . Total Lymphocyte 07/13/2017 17.5  % Final  .  Monocytes Relative 07/13/2017 8.5  % Final  . Eosinophils Relative 07/13/2017 2.1  % Final  . Basophils Relative 07/13/2017 0.5  % Final  . Iron 07/13/2017 53  45 - 160 mcg/dL Final  . TIBC 07/13/2017 441  250 - 450 mcg/dL (calc) Final  . %SAT 07/13/2017 12  11 - 50 % (calc) Final    RADIOGRAPHIC STUDIES: I have personally reviewed the radiological images as listed and agree with the findings in the report  No results found.  ASSESSMENT/PLAN Normocytic Anemia  PLAN: Will perform an anemia workup with labs as stated below. RTC in 1 week to discuss the labs and the next plan of care.  Referral made to Dr. Oneida Alar for screening colonoscopy.  Orders Placed This Encounter  Procedures  . CBC with Differential    Standing Status:   Future    Standing Expiration Date:   07/25/2018  . Comprehensive metabolic panel    Standing Status:   Future    Standing Expiration Date:   07/25/2018  . Erythropoietin    Standing Status:   Future    Standing Expiration Date:   07/25/2018  . Iron and TIBC    Standing Status:   Future    Standing Expiration Date:   07/25/2018  . Ferritin    Standing Status:   Future    Standing Expiration Date:   07/25/2018  . Vitamin B12    Standing Status:   Future    Standing Expiration Date:   07/25/2018  . Folate    Standing Status:   Future    Standing Expiration Date:   07/25/2018  . Lactate dehydrogenase    Standing Status:   Future    Standing Expiration Date:   07/25/2018  . Reticulocytes    Standing Status:   Future    Standing Expiration Date:   07/25/2018  . Immunofixation electrophoresis    Standing Status:   Future    Standing Expiration Date:   07/25/2018  . Protein electrophoresis, serum    Standing Status:   Future    Standing Expiration Date:   07/25/2018  All questions were answered. The patient knows to call the clinic with any problems, questions or concerns.  This note was electronically signed.    Twana First, MD   07/25/2017 10:15 AM

## 2017-07-26 LAB — PROTEIN ELECTROPHORESIS, SERUM
A/G Ratio: 1.1 (ref 0.7–1.7)
Albumin ELP: 3.3 g/dL (ref 2.9–4.4)
Alpha-1-Globulin: 0.2 g/dL (ref 0.0–0.4)
Alpha-2-Globulin: 0.7 g/dL (ref 0.4–1.0)
Beta Globulin: 1.1 g/dL (ref 0.7–1.3)
GAMMA GLOBULIN: 1.1 g/dL (ref 0.4–1.8)
Globulin, Total: 3.1 g/dL (ref 2.2–3.9)
TOTAL PROTEIN ELP: 6.4 g/dL (ref 6.0–8.5)

## 2017-07-26 LAB — IMMUNOFIXATION ELECTROPHORESIS
IGA: 226 mg/dL (ref 87–352)
IGM (IMMUNOGLOBULIN M), SRM: 80 mg/dL (ref 26–217)
IgG (Immunoglobin G), Serum: 1132 mg/dL (ref 700–1600)
Total Protein ELP: 6.5 g/dL (ref 6.0–8.5)

## 2017-07-26 LAB — ERYTHROPOIETIN: Erythropoietin: 135.1 m[IU]/mL — ABNORMAL HIGH (ref 2.6–18.5)

## 2017-07-27 DIAGNOSIS — J9611 Chronic respiratory failure with hypoxia: Secondary | ICD-10-CM | POA: Diagnosis not present

## 2017-07-27 DIAGNOSIS — G4733 Obstructive sleep apnea (adult) (pediatric): Secondary | ICD-10-CM | POA: Diagnosis not present

## 2017-07-27 DIAGNOSIS — Z23 Encounter for immunization: Secondary | ICD-10-CM | POA: Diagnosis not present

## 2017-08-07 ENCOUNTER — Other Ambulatory Visit: Payer: Self-pay

## 2017-08-07 ENCOUNTER — Encounter (HOSPITAL_COMMUNITY): Payer: Medicare Other | Attending: Oncology | Admitting: Oncology

## 2017-08-07 ENCOUNTER — Encounter (HOSPITAL_COMMUNITY): Payer: Self-pay | Admitting: Oncology

## 2017-08-07 VITALS — BP 138/55 | HR 68 | Temp 97.9°F | Resp 18 | Ht 62.0 in | Wt 245.0 lb

## 2017-08-07 DIAGNOSIS — D509 Iron deficiency anemia, unspecified: Secondary | ICD-10-CM

## 2017-08-07 DIAGNOSIS — D649 Anemia, unspecified: Secondary | ICD-10-CM | POA: Insufficient documentation

## 2017-08-07 DIAGNOSIS — D508 Other iron deficiency anemias: Secondary | ICD-10-CM

## 2017-08-07 NOTE — Progress Notes (Signed)
[facility] Initial Visit:  Patient Care Team: [name], MD as PCP - General (Family Medicine) Herminio Commons, MD as Attending Physician (Cardiology) Sandford Craze, MD as Referring Physician (Dermatology) Cristal Deer, DPM as Attending Physician (Podiatry) Danie Binder, MD as Consulting Physician (Gastroenterology)  CHIEF COMPLAINTS/PURPOSE OF CONSULTATION:  Anemia  HISTORY OF PRESENTING ILLNESS: Linda [name] 71 y.o. female presents here for evaluation of anemia.  Most recent CBC from 07/13/2017 demonstrated WBC 5.7K, hemoglobin 9.5 g/dL, hematocrit 30.1%, MCV 83.4, platelet count 166K.  Previous hemoglobin from 06/06/2017 was 9.6 g/dL.  Iron studies from 07/13/2017 demonstrated iron of 53, TIBC 441, percent saturation 12%.  She states that she has been feeling fatigued.  She denies any bleeding including hematuria, hemoptysis, hematochezia, melena.  Her last colonoscopy was more than 5-10 years ago and she said that at that time she is found to have 1 polyp which was removed.  She has chronic shortness of breath and uses oxygen at night.  She denies any chest pain, palpitations, leg swelling, abdominal pain, constipation, nausea, vomiting.  She has occasional diarrhea from metformin.  She has neuropathy in her feet.  She denies any recent infections or fevers or chills.  She has no pain. Patient states that she has ice cravings all the time.   INTERVAL HISTORY: Patient presented today for continued follow up to review her anemia workup. She states that she had bronchitis last week. She was having congestion, worsening shortness of breath, and a productive cough with yellow sputum. Patient states that she did not go to get medical care for her bronchitis. It has now improved on its own. Otherwise she states she has not had any new issues since her last visit.  Review of Systems - Oncology ROS as per HPI otherwise 12 point ROS is  negative.  MEDICAL HISTORY: Past Medical History:  Diagnosis Date  . Allergy   . Anemia 07/25/2017  . Anxiety   . Arthritis   . Cataract   . COPD (chronic obstructive pulmonary disease) ([place])   . Depression   . Diabetes mellitus without complication ([place])   . Emphysema of lung ([place])   . Hyperlipidemia   . Hypertension   . Oxygen deficiency   . Sleep apnea     SURGICAL HISTORY: Past Surgical History:  Procedure Laterality Date  . ABDOMINAL HYSTERECTOMY     fibroids  . CARPAL TUNNEL RELEASE    . CESAREAN SECTION    . CHOLECYSTECTOMY    . 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 DILATION AND CURETTAGE OF UTERUS    . FOOT NEUROMA SURGERY Right   . MOUTH SURGERY    . TONSILLECTOMY      SOCIAL HISTORY: Social History   Socioeconomic History  . Marital status: Widowed    Spouse name: Not on file  . Number of children: 1  . Years of education: 53  . Highest education level: Not on file  Social Needs  . Financial resource strain: Not on file  . Food insecurity - worry: Not on file  . Food insecurity - inability: Not on file  . Transportation needs - medical: Not on file  . Transportation needs - non-medical: Not on file  Occupational History  . Occupation: retired    Comment: catering, Engineer, maintenance  Tobacco Use  . Smoking status: Former Smoker    Packs/day: 1.50    Types: Cigarettes    Start date:  09/25/1958    Last attempt to quit: 06/06/2001    Years since quitting: 16.1  . Smokeless tobacco: Never Used  Substance and Sexual Activity  . Alcohol use: No  . Drug use: No  . Sexual activity: Not Currently  Other Topics Concern  . Not on file  Social History Narrative   Lives alone   Son lives in [place]   TV, reads, puzzles    FAMILY HISTORY Family History  Problem Relation Age of Onset  . Emphysema Mother   . Alcohol abuse Mother   . Arthritis Mother   . COPD Mother   . Depression Mother   . Hyperlipidemia Mother    . Hypertension Mother   . Heart attack Father   . Cancer Father        lung  . Heart disease Father   . Diabetes Brother   . COPD Brother   . Other Maternal Grandmother        swine flu 1919    ALLERGIES:  is allergic to trazodone and nefazodone; adhesive [tape]; bactrim [sulfamethoxazole-trimethoprim]; penicillins; and ppd [tuberculin purified protein derivative].  MEDICATIONS:  Current Outpatient Medications  Medication Sig Dispense Refill  . acetaminophen (TYLENOL) 500 MG tablet Take 500 mg by mouth daily as needed for pain.    Marland Kitchen albuterol (PROVENTIL HFA;VENTOLIN HFA) 108 (90 Base) MCG/ACT inhaler Inhale 1 puff into the lungs as needed for wheezing or shortness of breath.    Marland Kitchen apixaban (ELIQUIS) 5 MG TABS tablet Take 1 tablet (5 mg total) by mouth 2 (two) times daily. 180 tablet 3  . cetirizine (ZYRTEC) 10 MG tablet Take 10 mg by mouth daily.    . cholecalciferol (VITAMIN D) 1000 units tablet Take 2,000 Units by mouth daily.    . citalopram (CELEXA) 20 MG tablet Take 20 mg by mouth daily.    . diphenhydrAMINE (SOMINEX) 25 MG tablet Take 25 mg by mouth at bedtime as needed for sleep.    Marland Kitchen Fexofenadine HCl (MUCINEX ALLERGY PO) Take 1 tablet by mouth as needed.    . Fluticasone-Salmeterol (ADVAIR DISKUS) 250-50 MCG/DOSE AEPB Inhale 1 puff into the lungs 2 (two) times daily.    . furosemide (LASIX) 20 MG tablet Take 1 tablet (20 mg total) by mouth as needed. 30 tablet 1  . losartan (COZAAR) 50 MG tablet Take 1 tablet (50 mg total) by mouth daily.    . metFORMIN (GLUCOPHAGE) 1000 MG tablet Take 1,000 mg by mouth daily.    . metoprolol succinate (TOPROL-XL) 50 MG 24 hr tablet Take 1 1/2 tabs ('75mg'$ ) every morning & 1 tab ('50mg'$ ) every evening (Patient taking differently: Take 1 1/2 tabs ('75mg'$ ) every morning & 1/2 tab ('25mg'$ ) every evening) 225 tablet 3  . pravastatin (PRAVACHOL) 20 MG tablet Take 20 mg by mouth daily.    Marland Kitchen tiotropium (SPIRIVA) 18 MCG inhalation capsule Place 18 mcg into  inhaler and inhale daily.     No current facility-administered medications for this visit.     PHYSICAL EXAMINATION:  Physical Exam Constitutional: Well-developed, well-nourished, and in no distress.   HENT:  Head: Normocephalic and atraumatic.  Mouth/Throat: No oropharyngeal exudate. Mucosa moist. Eyes: Pupils are equal, round, and reactive to light. Conjunctivae are normal. No scleral icterus.  Neck: Normal range of motion. Neck supple. No JVD present.  Cardiovascular: Normal rate, regular rhythm and normal heart sounds.  Exam reveals no gallop and no friction rub.   No murmur heard. Pulmonary/Chest: Effort normal and breath sounds  normal. No respiratory distress. No wheezes.No rales.  Abdominal: Soft. Bowel sounds are normal. No distension. There is no tenderness. There is no guarding.  Musculoskeletal: No edema or tenderness.  Lymphadenopathy:    No cervical or supraclavicular adenopathy.  Neurological: Alert and oriented to person, place, and time. No cranial nerve deficit.  Skin: Skin is warm and dry. No rash noted. No erythema. No pallor.  Psychiatric: Affect and judgment normal.   LABORATORY DATA: I have personally reviewed the data as listed:  Appointment on 07/25/2017  Component Date Value Ref Range Status  . WBC 07/25/2017 4.6  4.0 - 10.5 K/uL Final  . RBC 07/25/2017 3.78* 3.87 - 5.11 MIL/uL Final  . Hemoglobin 07/25/2017 9.5* 12.0 - 15.0 g/dL Final  . HCT 07/25/2017 33.0* 36.0 - 46.0 % Final  . MCV 07/25/2017 87.3  78.0 - 100.0 fL Final  . MCH 07/25/2017 25.1* 26.0 - 34.0 pg Final  . MCHC 07/25/2017 28.8* 30.0 - 36.0 g/dL Final  . RDW 07/25/2017 16.7* 11.5 - 15.5 % Final  . Platelets 07/25/2017 129* 150 - 400 K/uL Final  . Neutrophils Relative % 07/25/2017 77  % Final  . Lymphocytes Relative 07/25/2017 14  % Final  . Monocytes Relative 07/25/2017 7  % Final  . Eosinophils Relative 07/25/2017 2  % Final  . Basophils Relative 07/25/2017 0  % Final  . Neutro Abs  07/25/2017 3.6  1.7 - 7.7 K/uL Final  . Lymphs Abs 07/25/2017 0.6* 0.7 - 4.0 K/uL Final  . Monocytes Absolute 07/25/2017 0.3  0.1 - 1.0 K/uL Final  . Eosinophils Absolute 07/25/2017 0.1  0.0 - 0.7 K/uL Final  . Basophils Absolute 07/25/2017 0.0  0.0 - 0.1 K/uL Final  . Smear Review 07/25/2017 MORPHOLOGY UNREMARKABLE   Final  . Sodium 07/25/2017 136  135 - 145 mmol/L Final  . Potassium 07/25/2017 4.6  3.5 - 5.1 mmol/L Final  . Chloride 07/25/2017 99* 101 - 111 mmol/L Final  . CO2 07/25/2017 29  22 - 32 mmol/L Final  . Glucose, Bld 07/25/2017 146* 65 - 99 mg/dL Final  . BUN 07/25/2017 11  6 - 20 mg/dL Final  . Creatinine, Ser 07/25/2017 0.97  0.44 - 1.00 mg/dL Final  . Calcium 07/25/2017 9.3  8.9 - 10.3 mg/dL Final  . Total Protein 07/25/2017 7.0  6.5 - 8.1 g/dL Final  . Albumin 07/25/2017 3.6  3.5 - 5.0 g/dL Final  . AST 07/25/2017 36  15 - 41 U/L Final  . ALT 07/25/2017 20  14 - 54 U/L Final  . Alkaline Phosphatase 07/25/2017 99  38 - 126 U/L Final  . Total Bilirubin 07/25/2017 1.9* 0.3 - 1.2 mg/dL Final  . GFR calc non Af Amer 07/25/2017 58* >60 mL/min Final  . GFR calc Af Amer 07/25/2017 >60  >60 mL/min Final   Comment: (NOTE) The eGFR has been calculated using the CKD EPI equation. This calculation has not been validated in all clinical situations. eGFR's persistently <60 mL/min signify possible Chronic Kidney Disease.   . Anion gap 07/25/2017 8  5 - 15 Final  . Erythropoietin 07/25/2017 135.1* 2.6 - 18.5 mIU/mL Final   Comment: (NOTE) [employer] UniCel DxI 800 Immunoassay System Performed At: [facility] [place], [place] [id number] Lindon Romp MD EY:[id number]   . Iron 07/25/2017 60  28 - 170 ug/dL Final  . TIBC 07/25/2017 465* 250 - 450 ug/dL Final  . Saturation Ratios 07/25/2017 13  10.4 - 31.8 %  Final  . UIBC 07/25/2017 405  ug/dL Final   Performed at [facility], [place] [address], [place], [place] [address]  . Ferritin 07/25/2017  9* 11 - 307 ng/mL Final   Performed at [facility], [place] [address] [place], [place] [address]  . Vitamin B-12 07/25/2017 314  180 - 914 pg/mL Final   Comment: (NOTE) This assay is not validated for testing neonatal or myeloproliferative syndrome specimens for Vitamin B12 levels. Performed at [facility], [place] [address] [place], [place] [address]   . Folate 07/25/2017 8.0  >5.9 ng/mL Final   Performed at [facility], [place] [address], [place], [place] [address]  . LDH 07/25/2017 137  98 - 192 U/L Final  . Retic Ct Pct 07/25/2017 3.2* 0.4 - 3.1 % Final  . RBC. 07/25/2017 3.78* 3.87 - 5.11 MIL/uL Final  . Retic Count, Absolute 07/25/2017 121.0  19.0 - 186.0 K/uL Final  . Total Protein ELP 07/25/2017 6.5  6.0 - 8.5 g/dL Final  . IgG (Immunoglobin G), Serum 07/25/2017 1,132  700 - 1,600 mg/dL Final  . IgA 07/25/2017 226  87 - 352 mg/dL Final  . IgM (Immunoglobulin M), Srm 07/25/2017 80  26 - 217 mg/dL Final   Comment: (NOTE) Performed At: [facility] [place], [place] [id number] Lindon Romp MD [id number]   . Immunofixation Result, Serum 07/25/2017 Comment   Corrected   An apparent normal immunofixation pattern.  . Total Protein ELP 07/25/2017 6.4  6.0 - 8.5 g/dL Final  . Albumin ELP 07/25/2017 3.3  2.9 - 4.4 g/dL Final  . Alpha-1-Globulin 07/25/2017 0.2  0.0 - 0.4 g/dL Final  . Alpha-2-Globulin 07/25/2017 0.7  0.4 - 1.0 g/dL Final  . Beta Globulin 07/25/2017 1.1  0.7 - 1.3 g/dL Final  . Gamma Globulin 07/25/2017 1.1  0.4 - 1.8 g/dL Final  . M-Spike, % 07/25/2017 Not Observed  Not Observed g/dL Final  . SPE Interp. 07/25/2017 Comment   Final   Comment: (NOTE) The SPE pattern appears essentially unremarkable. Evidence of monoclonal protein is not apparent. Performed At: [facility] [place], [place] [id number] Lindon Romp MD [id number]   . Comment 07/25/2017 Comment   Final   Comment: (NOTE) Protein  electrophoresis scan will follow via computer, mail, or courier delivery.   Marland Kitchen GLOBULIN, TOTAL 07/25/2017 3.1  2.2 - 3.9 g/dL Corrected  . A/G Ratio 07/25/2017 1.1  0.7 - 1.7 Corrected  Office Visit on 07/13/2017  Component Date Value Ref Range Status  . Vitamin B-12 07/13/2017 379  200 - 1,100 pg/mL Final   Comment: . Please Note: Although the reference range for vitamin B12 is 662 133 4346 pg/mL, it has been reported that between 5 and 10% of patients with values between 200 and 400 pg/mL may experience neuropsychiatric and hematologic abnormalities due to occult B12 deficiency; less than 1% of patients with values above 400 pg/mL will have symptoms. .   . WBC 07/13/2017 5.7  3.8 - 10.8 Thousand/uL Final  . RBC 07/13/2017 3.85  3.80 - 5.10 Million/uL Final  . Hemoglobin 07/13/2017 9.5* 11.7 - 15.5 g/dL Final  . HCT 07/13/2017 32.1* 35.0 - 45.0 % Final  . MCV 07/13/2017 83.4  80.0 - 100.0 fL Final  . MCH 07/13/2017 24.7* 27.0 - 33.0 pg Final  . MCHC 07/13/2017 29.6* 32.0 - 36.0 g/dL Final  . RDW 07/13/2017 15.7* 11.0 - 15.0 % Final  . Platelets  07/13/2017 166  140 - 400 Thousand/uL Final  . MPV 07/13/2017 12.9* 7.5 - 12.5 fL Final  . Neutro Abs 07/13/2017 4,070  1,500 - 7,800 cells/uL Final  . Lymphs Abs 07/13/2017 998  850 - 3,900 cells/uL Final  . WBC mixed population 07/13/2017 485  200 - 950 cells/uL Final  . Eosinophils Absolute 07/13/2017 120  15 - 500 cells/uL Final  . Basophils Absolute 07/13/2017 29  0 - 200 cells/uL Final  . Neutrophils Relative % 07/13/2017 71.4  % Final  . Total Lymphocyte 07/13/2017 17.5  % Final  . Monocytes Relative 07/13/2017 8.5  % Final  . Eosinophils Relative 07/13/2017 2.1  % Final  . Basophils Relative 07/13/2017 0.5  % Final  . Iron 07/13/2017 53  45 - 160 mcg/dL Final  . TIBC 07/13/2017 441  250 - 450 mcg/dL (calc) Final  . %SAT 07/13/2017 12  11 - 50 % (calc) Final    RADIOGRAPHIC STUDIES: I have personally reviewed the radiological  images as listed and agree with the findings in the report  No results found.  ASSESSMENT/PLAN Normocytic Anemia with iron deficiency.  PLAN: -Reviewed patient's anemia workup in detail with her today. She has iron deficiency. I have set her up for a dose of feraheme.  -She has an appt set up with GI for screening colonoscopy eval on 08/22/17. -RTC in 3 months for follow up with the below labs.  Orders Placed This Encounter  Procedures  . CBC with Differential    Standing Status:   Future    Standing Expiration Date:   08/07/2018  . Comprehensive metabolic panel    Standing Status:   Future    Standing Expiration Date:   08/07/2018  . Iron and TIBC    Standing Status:   Future    Standing Expiration Date:   08/07/2018  . Ferritin    Standing Status:   Future    Standing Expiration Date:   08/07/2018    All questions were answered. The patient knows to call the clinic with any problems, questions or concerns.  This note was electronically signed.    Twana First, MD  08/07/2017 12:06 PM

## 2017-08-08 ENCOUNTER — Other Ambulatory Visit: Payer: Self-pay

## 2017-08-08 MED ORDER — PRAVASTATIN SODIUM 20 MG PO TABS: 20.0000 mg | ORAL_TABLET | Freq: Every day | ORAL | 3 refills | Status: AC

## 2017-08-08 NOTE — Telephone Encounter (Signed)
10 19 18 

## 2017-08-10 ENCOUNTER — Encounter (HOSPITAL_BASED_OUTPATIENT_CLINIC_OR_DEPARTMENT_OTHER): Payer: Medicare Other

## 2017-08-10 ENCOUNTER — Ambulatory Visit (HOSPITAL_COMMUNITY): Payer: Medicare Other

## 2017-08-10 ENCOUNTER — Encounter (HOSPITAL_COMMUNITY): Payer: Self-pay

## 2017-08-10 ENCOUNTER — Other Ambulatory Visit: Payer: Self-pay

## 2017-08-10 VITALS — BP 101/55 | HR 65 | Temp 97.8°F | Resp 20

## 2017-08-10 DIAGNOSIS — D509 Iron deficiency anemia, unspecified: Secondary | ICD-10-CM | POA: Diagnosis present

## 2017-08-10 DIAGNOSIS — D508 Other iron deficiency anemias: Secondary | ICD-10-CM

## 2017-08-10 MED ORDER — SODIUM CHLORIDE 0.9 % IV SOLN
Freq: Once | INTRAVENOUS | Status: AC
  Administered 2017-08-10: 10:00:00 via INTRAVENOUS

## 2017-08-10 MED ORDER — SODIUM CHLORIDE 0.9 % IV SOLN
510.0000 mg | Freq: Once | INTRAVENOUS | Status: AC
  Administered 2017-08-10: 510 mg via INTRAVENOUS
  Filled 2017-08-10: qty 17

## 2017-08-10 NOTE — Progress Notes (Signed)
Tolerated infusion w/o adverse reaction.  Alert, in no distress.  Discharged via wheelchair.

## 2017-08-15 ENCOUNTER — Encounter: Payer: Medicare Other | Attending: Family Medicine | Admitting: Nutrition

## 2017-08-15 ENCOUNTER — Encounter: Payer: Self-pay | Admitting: Nutrition

## 2017-08-15 VITALS — Wt 247.8 lb

## 2017-08-15 DIAGNOSIS — Z713 Dietary counseling and surveillance: Secondary | ICD-10-CM | POA: Insufficient documentation

## 2017-08-15 DIAGNOSIS — Z6841 Body Mass Index (BMI) 40.0 and over, adult: Secondary | ICD-10-CM | POA: Diagnosis not present

## 2017-08-15 DIAGNOSIS — E1169 Type 2 diabetes mellitus with other specified complication: Secondary | ICD-10-CM | POA: Insufficient documentation

## 2017-08-15 DIAGNOSIS — IMO0002 Reserved for concepts with insufficient information to code with codable children: Secondary | ICD-10-CM

## 2017-08-15 DIAGNOSIS — E118 Type 2 diabetes mellitus with unspecified complications: Secondary | ICD-10-CM

## 2017-08-15 DIAGNOSIS — E1165 Type 2 diabetes mellitus with hyperglycemia: Secondary | ICD-10-CM

## 2017-08-15 NOTE — Progress Notes (Signed)
  Medical Nutrition Therapy:  Appt start time: 1000  end time:  1030.   Assessment:  Primary concerns today: Diabetes Type 2 DM.   Getting over bronchitis.  Feeling better. Lost 6 lbs.  FBS 105-121 mg/dl. Hasn't been taking Metformin when she was sick because she couldn't eat and didn't want to take on empty stomach..  7 day avg 114 14 day 124 mg/dl and 30 day 140 mg/dl. Feels better.     Lab Results  Component Value Date   HGBA1C 6.8 (H) 06/06/2017     Preferred Learning Style:   No preference indicated   Learning Readiness:  Ready  Change in progress   MEDICATIONS: see list   DIETARY INTAKE:  24-hr recall:  B ( AM):  Butter biscuit or skipped. Snk ( AM):   L ( PM): nabs Snk ( PM):   D ( PM):  Grilled cheese sandwich, water Snk ( PM): Beverages: water  Usual physical activity: ADL  Estimated energy needs: 1200  calories 135  g carbohydrates 90 g protein 33 g fat  Progress Towards Goal(s):  In progress.   Nutritional Diagnosis:  NB-1.1 Food and nutrition-related knowledge deficit As related to Diabetes.  As evidenced by A1C 6.8%..    Intervention: Nutrition and Diabetes education provided on My Plate, CHO counting, meal planning, portion sizes, timing of meals, avoiding snacks between meals unless having a low blood sugar, target ranges for A1C and blood sugars, signs/symptoms and treatment of hyper/hypoglycemia, monitoring blood sugars, taking medications as prescribed, benefits of exercising 30 minutes per day and prevention of complications of DM.   Goal Increase fresh fruits and vegetables Cut out processed salty foods of nabs, biscuit  Drink only water.   Teaching Method Utilized:   Visual Auditory Hands on  Handouts given during visit include:  The Plate Method  Meal Plan  Diabetes Instructions.  Barriers to learning/adherence to lifestyle change: none  Demonstrated degree of understanding via:  Teach Back   Monitoring/Evaluation:   Dietary intake, exercise, meal planning, and body weight in 3 month(s).

## 2017-08-15 NOTE — Patient Instructions (Signed)
Goal Increase fresh fruits and vegetables Cut out processed salty foods of nabs, biscuit  Drink only water.

## 2017-08-21 ENCOUNTER — Encounter: Payer: Self-pay | Admitting: *Deleted

## 2017-08-21 ENCOUNTER — Ambulatory Visit (INDEPENDENT_AMBULATORY_CARE_PROVIDER_SITE_OTHER): Payer: Medicare Other | Admitting: Cardiovascular Disease

## 2017-08-21 ENCOUNTER — Encounter: Payer: Self-pay | Admitting: Cardiovascular Disease

## 2017-08-21 ENCOUNTER — Other Ambulatory Visit: Payer: Self-pay

## 2017-08-21 VITALS — BP 126/70 | HR 62 | Ht 62.0 in | Wt 250.0 lb

## 2017-08-21 DIAGNOSIS — I10 Essential (primary) hypertension: Secondary | ICD-10-CM

## 2017-08-21 DIAGNOSIS — I272 Pulmonary hypertension, unspecified: Secondary | ICD-10-CM | POA: Diagnosis not present

## 2017-08-21 DIAGNOSIS — G4733 Obstructive sleep apnea (adult) (pediatric): Secondary | ICD-10-CM | POA: Diagnosis not present

## 2017-08-21 DIAGNOSIS — J432 Centrilobular emphysema: Secondary | ICD-10-CM

## 2017-08-21 DIAGNOSIS — I2510 Atherosclerotic heart disease of native coronary artery without angina pectoris: Secondary | ICD-10-CM | POA: Diagnosis not present

## 2017-08-21 DIAGNOSIS — R0609 Other forms of dyspnea: Secondary | ICD-10-CM | POA: Diagnosis not present

## 2017-08-21 DIAGNOSIS — I4891 Unspecified atrial fibrillation: Secondary | ICD-10-CM

## 2017-08-21 NOTE — Progress Notes (Signed)
SUBJECTIVE: The patient presents for follow-up of persistent atrial fibrillation.  She has sleep apnea.  She did not tolerate CPAP. She has had progressive exertional dyspnea over the past 6 months.  She tells me she does not see a pulmonologist and believe she sees respiratory therapist.  She denies chest pain.  Palpitations are much better controlled on the higher dose of Toprol-XL.     Review of Systems: As per "subjective", otherwise negative.  Allergies  Allergen Reactions  . Trazodone And Nefazodone Hives    "blisters my skin"   . Adhesive [Tape] Itching  . Bactrim [Sulfamethoxazole-Trimethoprim] Itching and Rash  . Penicillins Itching and Rash  . Ppd [Tuberculin Purified Protein Derivative] Swelling    Local arm swelling    Current Outpatient Medications  Medication Sig Dispense Refill  . acetaminophen (TYLENOL) 500 MG tablet Take 500 mg by mouth daily as needed for pain.    Marland Kitchen albuterol (PROVENTIL HFA;VENTOLIN HFA) 108 (90 Base) MCG/ACT inhaler Inhale 1 puff into the lungs as needed for wheezing or shortness of breath.    Marland Kitchen apixaban (ELIQUIS) 5 MG TABS tablet Take 1 tablet (5 mg total) by mouth 2 (two) times daily. 180 tablet 3  . cetirizine (ZYRTEC) 10 MG tablet Take 10 mg by mouth daily.    . cholecalciferol (VITAMIN D) 1000 units tablet Take 2,000 Units by mouth daily.    . citalopram (CELEXA) 20 MG tablet Take 20 mg by mouth daily.    . diphenhydrAMINE (SOMINEX) 25 MG tablet Take 25 mg by mouth at bedtime as needed for sleep.    Marland Kitchen Fexofenadine HCl (MUCINEX ALLERGY PO) Take 1 tablet by mouth as needed.    . Fluticasone-Salmeterol (ADVAIR DISKUS) 250-50 MCG/DOSE AEPB Inhale 1 puff into the lungs 2 (two) times daily.    . furosemide (LASIX) 20 MG tablet Take 1 tablet (20 mg total) by mouth as needed. 30 tablet 1  . losartan (COZAAR) 50 MG tablet Take 1 tablet (50 mg total) by mouth daily.    . metFORMIN (GLUCOPHAGE) 1000 MG tablet Take 1,000 mg by mouth daily.    .  metoprolol succinate (TOPROL-XL) 50 MG 24 hr tablet Take 1 1/2 tabs (75mg ) every morning & 1 tab (50mg ) every evening (Patient taking differently: Take 1 1/2 tabs (75mg ) every morning & 1/2 tab (25mg ) every evening) 225 tablet 3  . pravastatin (PRAVACHOL) 20 MG tablet Take 1 tablet (20 mg total) daily by mouth. 90 tablet 3  . tiotropium (SPIRIVA) 18 MCG inhalation capsule Place 18 mcg into inhaler and inhale daily.     No current facility-administered medications for this visit.     Past Medical History:  Diagnosis Date  . Allergy   . Anemia 07/25/2017  . Anxiety   . Arthritis   . Cataract   . COPD (chronic obstructive pulmonary disease) ([place])   . Depression   . Diabetes mellitus without complication ([place])   . Emphysema of lung ([place])   . Hyperlipidemia   . Hypertension   . Oxygen deficiency   . Sleep apnea     Past Surgical History:  Procedure Laterality Date  . ABDOMINAL HYSTERECTOMY     fibroids  . CARPAL TUNNEL RELEASE    . CATARACT EXTRACTION W/PHACO Left 06/16/2013   Procedure: CATARACT EXTRACTION PHACO AND INTRAOCULAR LENS PLACEMENT (IOC);  Surgeon: Tonny Branch, MD;  Location: [facility];  Service: Ophthalmology;  Laterality: Left;  CDE:  12.18  . CATARACT EXTRACTION W/PHACO Right  06/26/2013   Procedure: CATARACT EXTRACTION PHACO AND INTRAOCULAR LENS PLACEMENT (IOC);  Surgeon: Tonny Branch, MD;  Location: [facility];  Service: Ophthalmology;  Laterality: Right;  CDE:14.71  . CESAREAN SECTION    . CHOLECYSTECTOMY    . 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 DILATION AND CURETTAGE OF UTERUS    . FOOT NEUROMA SURGERY Right   . MOUTH SURGERY    . TONSILLECTOMY      Social History   Socioeconomic History  . Marital status: Widowed    Spouse name: Not on file  . Number of children: 1  . Years of education: 17  . Highest education level: Not on file  Social Needs  . Financial resource strain: Not on file  . Food insecurity - worry: Not on file    . Food insecurity - inability: Not on file  . Transportation needs - medical: Not on file  . Transportation needs - non-medical: Not on file  Occupational History  . Occupation: retired    Comment: catering, Engineer, maintenance  Tobacco Use  . Smoking status: Former Smoker    Packs/day: 1.50    Types: Cigarettes    Start date: 09/25/1958    Last attempt to quit: 06/06/2001    Years since quitting: 16.2  . Smokeless tobacco: Never Used  Substance and Sexual Activity  . Alcohol use: No  . Drug use: No  . Sexual activity: Not Currently  Other Topics Concern  . Not on file  Social History Narrative   Lives alone   Son lives in [place]   TV, reads, puzzles     Vitals:   08/21/17 1133  BP: 126/70  Pulse: 62  SpO2: 94%  Weight: 250 lb (113.4 kg)  Height: 5\' 2"  (1.575 m)    Wt Readings from Last 3 Encounters:  08/21/17 250 lb (113.4 kg)  08/15/17 247 lb 12.8 oz (112.4 kg)  08/07/17 245 lb (111.1 kg)     PHYSICAL EXAM General: NAD HEENT: Normal. Neck: No JVD, no thyromegaly. Lungs: Clear to auscultation bilaterally with normal respiratory effort. CV: Regular rate and rhythm, normal S1/S2, no S3/S4, no murmur. No pretibial or periankle edema. Abdomen: Soft, nontender, no distention.  Neurologic: Alert and oriented.  Psych: Normal affect. Skin: Normal. Musculoskeletal: No gross deformities.    ECG: Most recent ECG reviewed.   Labs: Lab Results  Component Value Date/Time   K 4.6 07/25/2017 10:31 AM   BUN 11 07/25/2017 10:31 AM   CREATININE 0.97 07/25/2017 10:31 AM   CREATININE 1.02 (H) 06/06/2017 11:04 AM   ALT 20 07/25/2017 10:31 AM   HGB 9.5 (L) 07/25/2017 10:31 AM     Lipids: Lab Results  Component Value Date/Time   CHOL 140 06/06/2017 11:04 AM   TRIG 141 06/06/2017 11:04 AM   HDL 47 (L) 06/06/2017 11:04 AM       ASSESSMENT AND PLAN:  1. Persistent atrial fibirllation:Symptomatically stable on Toprol-XL 75 mg every morning and 50 mg every  afternoon.  Anticoagulated with Eliquis.  No changes to therapy.  2. Pulmonary hypertension:This was seen on CTA of the chest. Moderately elevated pulmonarypressures by echocardiography as detailed above. Likely due to COPD. Diastolic function was normal.  3. Coronary artery calcifications on CT/aortic atherosclerosis with progressive exertional dyspnea:Nuclear stress test in June 2013 demonstrated "probably normal perfusion". Cardiac function is normal.Continue beta blocker and statin. I will obtain a Lexiscan Myoview stress test to evaluate for interval changes in myocardial perfusion.  4. Obstructive sleep apnea: She is intolerant of CPAP.  5.  COPD progressive exertional dyspnea: I would consider pulmonary referral if her stress test is normal given her progressive exertional dyspnea.    Disposition: Follow up 3 months   Kate Sable, M.D., F.A.C.C.

## 2017-08-21 NOTE — Patient Instructions (Signed)
Medication Instructions:  Continue all current medications.  Labwork: none  Testing/Procedures:  Your physician has requested that you have a lexiscan myoview. For further information please visit [email or web address]. Please follow instruction sheet, as given.  Office will contact with results via phone or letter.    Follow-Up: 3 months   Any Other Special Instructions Will Be Listed Below (If Applicable).  If you need a refill on your cardiac medications before your next appointment, please call your pharmacy.  

## 2017-08-22 ENCOUNTER — Telehealth: Payer: Self-pay | Admitting: *Deleted

## 2017-08-22 ENCOUNTER — Ambulatory Visit (INDEPENDENT_AMBULATORY_CARE_PROVIDER_SITE_OTHER): Payer: Medicare Other | Admitting: Gastroenterology

## 2017-08-22 ENCOUNTER — Encounter: Payer: Self-pay | Admitting: Gastroenterology

## 2017-08-22 DIAGNOSIS — D508 Other iron deficiency anemias: Secondary | ICD-10-CM | POA: Diagnosis not present

## 2017-08-22 DIAGNOSIS — I2510 Atherosclerotic heart disease of native coronary artery without angina pectoris: Secondary | ICD-10-CM

## 2017-08-22 DIAGNOSIS — D509 Iron deficiency anemia, unspecified: Secondary | ICD-10-CM | POA: Insufficient documentation

## 2017-08-22 MED ORDER — TIOTROPIUM BROMIDE MONOHYDRATE 18 MCG IN CAPS: 18.0000 ug | ORAL_CAPSULE | Freq: Every day | RESPIRATORY_TRACT | 3 refills | Status: DC

## 2017-08-22 MED ORDER — TIOTROPIUM BROMIDE MONOHYDRATE 18 MCG IN CAPS: 18.0000 ug | ORAL_CAPSULE | Freq: Every day | RESPIRATORY_TRACT | 0 refills | Status: DC

## 2017-08-22 NOTE — Progress Notes (Signed)
Primary Care Physician:  [name], MD Primary Gastroenterologist:  Dr. Gala Romney   Chief Complaint  Patient presents with  . Anemia    Microctytic, 1 pint of iron given through IV 1-2 weeks ago    HPI:   Linda [name] is a 71 y.o. female presenting today at the request of Dr. Talbert Cage secondary to [place]. She has new onset IDA with last Hgb 9.5, ferritin 9. Received iron infusion Nov 2018.   No overt GI bleeding. No changes in bowel habits. On metformin with looser stool at times. No abdominal pain. No N/V. Notes solid food dysphagia when she is not sitting up straight. No issues with reflux. No unexplained weight loss or lack of appetite. Last colonoscopy over 10 years ago in [place] with one reported polyp by patient. No prior EGD.   Wears oxygen with activity and sleeping. Due to progressive exertional dyspnea, she will be undergoing a lexiscan myoview stress test on 12/3.   Past Medical History:  Diagnosis Date  . Allergy   . Anemia 07/25/2017  . Anxiety   . Arthritis   . Atrial fibrillation ([place])   . Cataract   . COPD (chronic obstructive pulmonary disease) ([place])   . Depression   . Diabetes mellitus without complication ([place])   . Emphysema of lung ([place])   . Hyperlipidemia   . Hypertension   . Oxygen deficiency   . Sleep apnea     Past Surgical History:  Procedure Laterality Date  . ABDOMINAL HYSTERECTOMY     fibroids  . blood clot removal from base of brain  1990  . CARPAL TUNNEL RELEASE    . CATARACT EXTRACTION W/PHACO Left 06/16/2013   Procedure: CATARACT EXTRACTION PHACO AND INTRAOCULAR LENS PLACEMENT (IOC);  Surgeon: Tonny Branch, MD;  Location: [facility];  Service: Ophthalmology;  Laterality: Left;  CDE:  12.18  . CATARACT EXTRACTION W/PHACO Right 06/26/2013   Procedure: CATARACT EXTRACTION PHACO AND INTRAOCULAR LENS PLACEMENT (IOC);  Surgeon: Tonny Branch, MD;  Location: [facility];  Service: Ophthalmology;  Laterality: Right;  CDE:14.71  . CESAREAN SECTION      . CHOLECYSTECTOMY    . DILATION AND CURETTAGE OF UTERUS    . EYE SURGERY  12/2016  . FOOT NEUROMA SURGERY Right   . MOUTH SURGERY    . TONSILLECTOMY      Current Outpatient Medications  Medication Sig Dispense Refill  . acetaminophen (TYLENOL) 500 MG tablet Take 500 mg by mouth daily as needed for pain.    Marland Kitchen albuterol (PROVENTIL HFA;VENTOLIN HFA) 108 (90 Base) MCG/ACT inhaler Inhale 1 puff into the lungs as needed for wheezing or shortness of breath.    Marland Kitchen apixaban (ELIQUIS) 5 MG TABS tablet Take 1 tablet (5 mg total) by mouth 2 (two) times daily. 180 tablet 3  . cetirizine (ZYRTEC) 10 MG tablet Take 10 mg by mouth daily.    . cholecalciferol (VITAMIN D) 1000 units tablet Take 2,000 Units by mouth daily.    . citalopram (CELEXA) 20 MG tablet Take 20 mg by mouth daily.    . diphenhydrAMINE (SOMINEX) 25 MG tablet Take 25 mg by mouth at bedtime as needed for sleep.    Marland Kitchen Fexofenadine HCl (MUCINEX ALLERGY PO) Take 1 tablet by mouth as needed.    . Fluticasone-Salmeterol (ADVAIR DISKUS) 250-50 MCG/DOSE AEPB Inhale 1 puff into the lungs 2 (two) times daily.    . furosemide (LASIX) 20 MG tablet Take 1 tablet (20 mg total) by mouth  as needed. 30 tablet 1  . losartan (COZAAR) 50 MG tablet Take 1 tablet (50 mg total) by mouth daily.    . metFORMIN (GLUCOPHAGE) 1000 MG tablet Take 1,000 mg by mouth daily.    . metoprolol succinate (TOPROL-XL) 50 MG 24 hr tablet Take 1 1/2 tabs (75mg ) every morning & 1 tab (50mg ) every evening (Patient taking differently: Take 1 1/2 tabs (75mg ) every morning & 1/2 tab (25mg ) every evening) 225 tablet 3  . pravastatin (PRAVACHOL) 20 MG tablet Take 1 tablet (20 mg total) daily by mouth. 90 tablet 3  . tiotropium (SPIRIVA HANDIHALER) 18 MCG inhalation capsule Place 1 capsule (18 mcg total) into inhaler and inhale daily. 30 capsule 0  . tiotropium (SPIRIVA) 18 MCG inhalation capsule Place 18 mcg into inhaler and inhale daily.    Marland Kitchen tiotropium (SPIRIVA HANDIHALER) 18 MCG  inhalation capsule Place 1 capsule (18 mcg total) into inhaler and inhale daily. 90 capsule 3   No current facility-administered medications for this visit.     Allergies as of 08/22/2017 - Review Complete 08/22/2017  Allergen Reaction Noted  . Trazodone and nefazodone Hives 01/10/2017  . Adhesive [tape] Itching 06/09/2013  . Bactrim [sulfamethoxazole-trimethoprim] Itching and Rash 06/09/2013  . Penicillins Itching and Rash 06/09/2013  . Ppd [tuberculin purified protein derivative] Swelling 06/09/2013    Family History  Problem Relation Age of Onset  . Emphysema Mother   . Alcohol abuse Mother   . Arthritis Mother   . COPD Mother   . Depression Mother   . Hyperlipidemia Mother   . Hypertension Mother   . Heart attack Father   . Cancer Father        lung  . Heart disease Father   . Diabetes Brother   . COPD Brother   . Other Maternal Grandmother        swine flu 1919  . Colon cancer Neg Hx   . Colon polyps Neg Hx     Social History   Socioeconomic History  . Marital status: Widowed    Spouse name: Not on file  . Number of children: 1  . Years of education: 62  . Highest education level: Not on file  Social Needs  . Financial resource strain: Not on file  . Food insecurity - worry: Not on file  . Food insecurity - inability: Not on file  . Transportation needs - medical: Not on file  . Transportation needs - non-medical: Not on file  Occupational History  . Occupation: retired    Comment: catering, Engineer, maintenance  Tobacco Use  . Smoking status: Former Smoker    Packs/day: 1.50    Types: Cigarettes    Start date: 09/25/1958    Last attempt to quit: 06/06/2001    Years since quitting: 16.2  . Smokeless tobacco: Never Used  Substance and Sexual Activity  . Alcohol use: No  . Drug use: No  . Sexual activity: Not Currently  Other Topics Concern  . Not on file  Social History Narrative   Lives alone   Son lives in [place]   TV, reads, puzzles    Review  of Systems: Gen: see HPI  CV: Denies chest pain, heart palpitations, peripheral edema, syncope.  Resp: +DOE GI: see HPI  GU : Denies urinary burning, urinary frequency, urinary hesitancy MS: Denies joint pain, muscle weakness, cramps, or limitation of movement.  Derm: Denies rash, itching, dry skin Psych: Denies depression, anxiety, memory loss, and confusion Heme: Denies bruising, bleeding,  and enlarged lymph nodes.  Physical Exam: BP 138/70   Pulse (!) 58   Temp (!) 96.5 F (35.8 C) (Oral)   Ht 5\' 2"  (1.575 m)   Wt 249 lb 12.8 oz (113.3 kg)   BMI 45.69 kg/m  General:   Alert and oriented. Pleasant and cooperative. Well-nourished and well-developed.  Head:  Normocephalic and atraumatic. Eyes:  Without icterus, sclera clear and conjunctiva pink.  Ears:  Normal auditory acuity. Nose:  No deformity, discharge,  or lesions. Mouth:  No deformity or lesions, oral mucosa pink.  Lungs:  Clear to auscultation bilaterally.  Heart:  S1, S2 present without murmurs appreciated.  Abdomen:  +BS, soft, non-tender, non-distended, obese. Limited exam with patient in chair. Unable to assist herself on to the exam table. Rectal:  Deferred  Msk:  Symmetrical without gross deformities. Normal posture. Extremities:  Without edema. Neurologic:  Alert and  oriented x4 Psych:  Alert and cooperative. Normal mood and affect.  Lab Results  Component Value Date   WBC 4.6 07/25/2017   HGB 9.5 (L) 07/25/2017   HCT 33.0 (L) 07/25/2017   MCV 87.3 07/25/2017   PLT 129 (L) 07/25/2017   Lab Results  Component Value Date   IRON 60 07/25/2017   TIBC 465 (H) 07/25/2017   FERRITIN 9 (L) 07/25/2017

## 2017-08-22 NOTE — Telephone Encounter (Signed)
Patient called and states she needs a refill of her Spiriva sent to [facility]. She also needs a 30 day supply sent to [facility] in [place] until her medication comes in from [place]. Please advise. Thank you

## 2017-08-22 NOTE — Patient Instructions (Addendum)
We are scheduling you for a colonoscopy, upper endoscopy, and dilation if needed with Dr. Gala Romney.  You will need to stop your Eliquis 2 days prior to the procedure. We are checking with Dr. Bronson Ing to make sure this is ok.  Have a great Christmas!

## 2017-08-22 NOTE — Telephone Encounter (Signed)
Done

## 2017-08-23 ENCOUNTER — Telehealth: Payer: Self-pay

## 2017-08-23 DIAGNOSIS — Z9181 History of falling: Secondary | ICD-10-CM | POA: Diagnosis not present

## 2017-08-23 DIAGNOSIS — J9611 Chronic respiratory failure with hypoxia: Secondary | ICD-10-CM | POA: Diagnosis not present

## 2017-08-23 DIAGNOSIS — J449 Chronic obstructive pulmonary disease, unspecified: Secondary | ICD-10-CM | POA: Diagnosis not present

## 2017-08-23 DIAGNOSIS — G4733 Obstructive sleep apnea (adult) (pediatric): Secondary | ICD-10-CM | POA: Diagnosis not present

## 2017-08-23 NOTE — Progress Notes (Signed)
cc'd to pcp 

## 2017-08-23 NOTE — Telephone Encounter (Signed)
Forwarding to [facility] to schedule.

## 2017-08-23 NOTE — Assessment & Plan Note (Signed)
71 year old female with new onset anemia and noted to have iron deficiency. No overt GI bleeding. No concerning lower GI symptoms. Notes solid food dysphagia if not sitting upright otherwise no obvious dysphagia. Will be undergoing a stress test in the next few days for evaluation of exertional dyspnea. She also is on Eliquis for afib, which we will need to hold 48 hours prior.  Await findings from stress test. If no issues, pursue diagnostic colonoscopy/EGD+/- dilatation with Dr. Gala Romney. Would utilize Propofol due to polypharmacy and comorbidities. Risks, benefits, alternatives discussed.  HOLD ELIQUIS X 48 hours prior  Continue follow-up with hematology

## 2017-08-23 NOTE — Telephone Encounter (Signed)
Dr. Jacinta Shoe,   This mutual patient was seen in our office yesterday by Roseanne Kaufman, NP.  [name] would like her to be scheduled for a colonoscopy and EGD with possible dilation with Dr. Gala Romney in the very near future.  Please advise if it is OK to hold the Eliquis for 48 hours prior to procedures.   Thanks so much !    Linda [name]

## 2017-08-23 NOTE — Telephone Encounter (Signed)
That would be fine 

## 2017-08-24 ENCOUNTER — Other Ambulatory Visit: Payer: Self-pay

## 2017-08-24 MED ORDER — CITALOPRAM HYDROBROMIDE 20 MG PO TABS: 20.0000 mg | ORAL_TABLET | Freq: Every day | ORAL | 3 refills | Status: DC

## 2017-08-24 NOTE — Telephone Encounter (Signed)
Seen 10 19 18

## 2017-08-27 ENCOUNTER — Encounter (HOSPITAL_COMMUNITY): Payer: Self-pay | Admitting: Anesthesiology

## 2017-08-27 ENCOUNTER — Encounter (HOSPITAL_COMMUNITY): Payer: Self-pay

## 2017-08-27 ENCOUNTER — Ambulatory Visit (HOSPITAL_COMMUNITY): Payer: Medicare Other | Admitting: Anesthesiology

## 2017-08-27 ENCOUNTER — Encounter (HOSPITAL_COMMUNITY)
Admission: RE | Admit: 2017-08-27 | Discharge: 2017-08-27 | Disposition: A | Discharge status: Home / Self Care | Payer: Medicare Other | Source: Ambulatory Visit | Attending: Cardiovascular Disease | Admitting: Cardiovascular Disease

## 2017-08-27 ENCOUNTER — Encounter: Payer: Self-pay | Admitting: Adult Health

## 2017-08-27 ENCOUNTER — Other Ambulatory Visit: Payer: Self-pay

## 2017-08-27 ENCOUNTER — Encounter (HOSPITAL_COMMUNITY)
Admission: RE | Disposition: A | Discharge status: Home / Self Care | Payer: Self-pay | Source: Ambulatory Visit | Attending: Cardiovascular Disease

## 2017-08-27 ENCOUNTER — Other Ambulatory Visit: Payer: Self-pay | Admitting: Cardiovascular Disease

## 2017-08-27 ENCOUNTER — Ambulatory Visit (HOSPITAL_COMMUNITY)
Admission: RE | Admit: 2017-08-27 | Discharge: 2017-08-27 | Disposition: A | Discharge status: Home / Self Care | Payer: Medicare Other | Source: Ambulatory Visit | Attending: Cardiovascular Disease | Admitting: Cardiovascular Disease

## 2017-08-27 DIAGNOSIS — I4892 Unspecified atrial flutter: Secondary | ICD-10-CM

## 2017-08-27 DIAGNOSIS — E119 Type 2 diabetes mellitus without complications: Secondary | ICD-10-CM | POA: Insufficient documentation

## 2017-08-27 DIAGNOSIS — I10 Essential (primary) hypertension: Secondary | ICD-10-CM

## 2017-08-27 DIAGNOSIS — I481 Persistent atrial fibrillation: Secondary | ICD-10-CM | POA: Insufficient documentation

## 2017-08-27 DIAGNOSIS — Z538 Procedure and treatment not carried out for other reasons: Secondary | ICD-10-CM | POA: Diagnosis not present

## 2017-08-27 DIAGNOSIS — Z01810 Encounter for preprocedural cardiovascular examination: Secondary | ICD-10-CM | POA: Insufficient documentation

## 2017-08-27 DIAGNOSIS — D649 Anemia, unspecified: Secondary | ICD-10-CM

## 2017-08-27 DIAGNOSIS — Z90710 Acquired absence of both cervix and uterus: Secondary | ICD-10-CM | POA: Insufficient documentation

## 2017-08-27 DIAGNOSIS — Z7984 Long term (current) use of oral hypoglycemic drugs: Secondary | ICD-10-CM

## 2017-08-27 DIAGNOSIS — E785 Hyperlipidemia, unspecified: Secondary | ICD-10-CM

## 2017-08-27 DIAGNOSIS — Z9889 Other specified postprocedural states: Secondary | ICD-10-CM

## 2017-08-27 DIAGNOSIS — J449 Chronic obstructive pulmonary disease, unspecified: Secondary | ICD-10-CM | POA: Insufficient documentation

## 2017-08-27 DIAGNOSIS — Z87891 Personal history of nicotine dependence: Secondary | ICD-10-CM | POA: Insufficient documentation

## 2017-08-27 DIAGNOSIS — Z79899 Other long term (current) drug therapy: Secondary | ICD-10-CM | POA: Insufficient documentation

## 2017-08-27 DIAGNOSIS — Z9049 Acquired absence of other specified parts of digestive tract: Secondary | ICD-10-CM

## 2017-08-27 DIAGNOSIS — I4891 Unspecified atrial fibrillation: Secondary | ICD-10-CM

## 2017-08-27 DIAGNOSIS — R0609 Other forms of dyspnea: Secondary | ICD-10-CM | POA: Insufficient documentation

## 2017-08-27 LAB — CBC
HEMATOCRIT: 39.1 % (ref 36.0–46.0)
HEMOGLOBIN: 11.1 g/dL — AB (ref 12.0–15.0)
MCH: 27.2 pg (ref 26.0–34.0)
MCHC: 28.4 g/dL — AB (ref 30.0–36.0)
MCV: 95.8 fL (ref 78.0–100.0)
Platelets: 188 10*3/uL (ref 150–400)
RBC: 4.08 MIL/uL (ref 3.87–5.11)
RDW: 22.7 % — ABNORMAL HIGH (ref 11.5–15.5)
WBC: 6.8 10*3/uL (ref 4.0–10.5)

## 2017-08-27 LAB — COMPREHENSIVE METABOLIC PANEL
ALK PHOS: 99 U/L (ref 38–126)
ALT: 16 U/L (ref 14–54)
ANION GAP: 10 (ref 5–15)
AST: 42 U/L — ABNORMAL HIGH (ref 15–41)
Albumin: 3.3 g/dL — ABNORMAL LOW (ref 3.5–5.0)
BILIRUBIN TOTAL: 1.8 mg/dL — AB (ref 0.3–1.2)
BUN: 13 mg/dL (ref 6–20)
CALCIUM: 9.3 mg/dL (ref 8.9–10.3)
CO2: 27 mmol/L (ref 22–32)
CREATININE: 0.97 mg/dL (ref 0.44–1.00)
Chloride: 99 mmol/L — ABNORMAL LOW (ref 101–111)
GFR, EST NON AFRICAN AMERICAN: 58 mL/min — AB (ref >60)
Glucose, Bld: 139 mg/dL — ABNORMAL HIGH (ref 65–99)
Potassium: 3.8 mmol/L (ref 3.5–5.1)
SODIUM: 136 mmol/L (ref 135–145)
TOTAL PROTEIN: 6.3 g/dL — AB (ref 6.5–8.1)

## 2017-08-27 SURGERY — CARDIOVERSION
Anesthesia: Monitor Anesthesia Care

## 2017-08-27 MED ORDER — REGADENOSON 0.4 MG/5ML IV SOLN
INTRAVENOUS | Status: AC
  Filled 2017-08-27: qty 5

## 2017-08-27 MED ORDER — MIDAZOLAM HCL 2 MG/2ML IJ SOLN
INTRAMUSCULAR | Status: AC
  Filled 2017-08-27: qty 2

## 2017-08-27 MED ORDER — TECHNETIUM TC 99M TETROFOSMIN IV KIT
10.0000 | PACK | Freq: Once | INTRAVENOUS | Status: AC | PRN
  Administered 2017-08-27: 10 via INTRAVENOUS

## 2017-08-27 MED ORDER — ONDANSETRON 4 MG PO TBDP
4.0000 mg | ORAL_TABLET | Freq: Once | ORAL | Status: AC
  Administered 2017-08-27: 4 mg via ORAL

## 2017-08-27 MED ORDER — LACTATED RINGERS IV SOLN
INTRAVENOUS | Status: DC
  Administered 2017-08-27: 12:00:00 via INTRAVENOUS

## 2017-08-27 MED ORDER — TECHNETIUM TC 99M TETROFOSMIN IV KIT: 30.0000 | PACK | Freq: Once | INTRAVENOUS | Status: DC | PRN

## 2017-08-27 MED ORDER — SODIUM CHLORIDE 0.9% FLUSH
INTRAVENOUS | Status: AC
  Filled 2017-08-27: qty 10

## 2017-08-27 MED ORDER — MIDAZOLAM HCL 2 MG/2ML IJ SOLN
1.0000 mg | Freq: Once | INTRAMUSCULAR | Status: AC | PRN
  Administered 2017-08-27: 2 mg via INTRAVENOUS
  Filled 2017-08-27: qty 2

## 2017-08-27 MED ORDER — ONDANSETRON 4 MG PO TBDP
ORAL_TABLET | ORAL | Status: AC
  Filled 2017-08-27: qty 1

## 2017-08-27 NOTE — Progress Notes (Signed)
Upon assessment, patient noted to be in NSR in the rate of 60's.  VS WNL and no complaints noted.  Dr Bronson Ing made aware and orders taken to continue Metoprolol and discontinue losartan.

## 2017-08-27 NOTE — Discharge Instructions (Signed)
STOP LOSARTAN (COZAAR) CONTINUE METOPROLOL AS ORDERED OFFICE WILL CONTACT YOU WITH AN APPOINTMENT WITH ELECTROPHYSIOLOGIST    Atrial Flutter Atrial flutter is a type of abnormal heart rhythm (arrhythmia). In atrial flutter, the heartbeat is fast but regular. There are two types of atrial flutter:  Paroxysmal atrial flutter. This type starts suddenly. It usually stops on its own soon after it starts.  Permanent atrial flutter. This type does not go away.  What are the causes? This condition may be caused by:  A heart condition or problem, such as: ? A heart attack. ? Heart failure. ? A heart valve problem.  A lung problem, such as: ? A blood clot in the lungs (pulmonary embolism, or PE). ? Chronic obstructive pulmonary disease.  Poorly controlled high blood pressure (hypertension).  Hyperthyroidism.  Caffeine.  Some decongestant cold medicines.  Low levels of minerals called electrolytes in the blood.  Cocaine.  What increases the risk? This condition is more likely to develop in:  Elderly adults.  Men.  What are the signs or symptoms? Symptoms of this condition include:  A feeling that your heart is pounding or racing (palpitations).  Shortness of breath.  Chest pain.  Feeling light-headed.  Dizziness.  Fainting.  How is this diagnosed? This condition may be diagnosed with tests, including:  An electrocardiogram (ECG). This is a painless test that records electrical signals in the heart.  Holter monitoring. For this test, you wear a device that records your heartbeat for 1-2 days.  Cardiac event monitoring. For this test, you wear a device that records your heartbeat for up to 30 days.  An echocardiogram. This is a painless test that uses sound waves to make a picture of your heart.  Stress test. This test records your heartbeat while you exercise.  Blood tests.  How is this treated? This condition may be treated with:  Treatment of any  underlying conditions.  Medicine to make your heart beat more slowly.  Medicine to keep the condition from coming back.  A procedure to keep the condition under control. Some procedures to do this include: ? Cardioversion. During this procedure, medicines or an electrical shock are given to make the heart beat normally. ? Ablation. During this procedure, the heart tissue that is causing the problem is destroyed. This procedure may be done if atrial flutter lasts a long time or happens often.  Follow these instructions at home:  Take over-the-counter and prescription medicines only as told by your health care provider.  Do not take any new medicines without talking to your health care provider.  Do not use tobacco products, including cigarettes, chewing tobacco, or e-cigarettes. If you need help quitting, ask your health care provider.  Limit alcohol intake to no more than 1 drink per day for nonpregnant women and 2 drinks per day for men. One drink equals 12 oz of beer, 5 oz of wine, or 1 oz of hard liquor.  Try to reduce any stress. Stress can make your symptoms worse. Contact a health care provider if:  Your symptoms get worse. Get help right away if:  You are dizzy.  You feel like fainting or you faint.  You have shortness of breath.  You feel pain or pressure in your chest.  You suddenly feel nauseous or you suddenly vomit.  There is a sudden change in your ability to speak, eat, or move.  You are sweating a lot for no reason. This information is not intended to replace advice given  to you by your health care provider. Make sure you discuss any questions you have with your health care provider. Document Released: 01/28/2009 Document Revised: 01/19/2016 Document Reviewed: 03/26/2015 Elsevier Interactive Patient Education  [employer].

## 2017-08-27 NOTE — Progress Notes (Signed)
SUBJECTIVE: Patient with persistent atrial fibrillation presented to stress test lab and found to be tachycardic, HR 148 bpm. She is asymptomatic at rest but dyspneic with exertion. When I saw her in the office on 08/21/17, she was in a regular rhythm.  BP 89/61.    Review of Systems: As per "subjective", otherwise negative.  Allergies  Allergen Reactions  . Trazodone And Nefazodone Hives    "blisters my skin"   . Adhesive [Tape] Itching  . Bactrim [Sulfamethoxazole-Trimethoprim] Itching and Rash  . Penicillins Itching and Rash  . Ppd [Tuberculin Purified Protein Derivative] Swelling    Local arm swelling    Current Outpatient Medications  Medication Sig Dispense Refill  . acetaminophen (TYLENOL) 500 MG tablet Take 500 mg by mouth daily as needed for pain.    Marland Kitchen albuterol (PROVENTIL HFA;VENTOLIN HFA) 108 (90 Base) MCG/ACT inhaler Inhale 1 puff into the lungs as needed for wheezing or shortness of breath.    Marland Kitchen apixaban (ELIQUIS) 5 MG TABS tablet Take 1 tablet (5 mg total) by mouth 2 (two) times daily. 180 tablet 3  . cetirizine (ZYRTEC) 10 MG tablet Take 10 mg by mouth daily.    . cholecalciferol (VITAMIN D) 1000 units tablet Take 2,000 Units by mouth daily.    . citalopram (CELEXA) 20 MG tablet Take 1 tablet (20 mg total) by mouth daily. 90 tablet 3  . diphenhydrAMINE (SOMINEX) 25 MG tablet Take 25 mg by mouth at bedtime as needed for sleep.    Marland Kitchen Fexofenadine HCl (MUCINEX ALLERGY PO) Take 1 tablet by mouth as needed.    . Fluticasone-Salmeterol (ADVAIR DISKUS) 250-50 MCG/DOSE AEPB Inhale 1 puff into the lungs 2 (two) times daily.    . furosemide (LASIX) 20 MG tablet Take 1 tablet (20 mg total) by mouth as needed. 30 tablet 1  . losartan (COZAAR) 50 MG tablet Take 1 tablet (50 mg total) by mouth daily.    . metFORMIN (GLUCOPHAGE) 1000 MG tablet Take 1,000 mg by mouth daily.    . metoprolol succinate (TOPROL-XL) 50 MG 24 hr tablet Take 1 1/2 tabs (75mg ) every morning & 1 tab  (50mg ) every evening (Patient taking differently: Take 1 1/2 tabs (75mg ) every morning & 1/2 tab (25mg ) every evening) 225 tablet 3  . pravastatin (PRAVACHOL) 20 MG tablet Take 1 tablet (20 mg total) daily by mouth. 90 tablet 3  . tiotropium (SPIRIVA HANDIHALER) 18 MCG inhalation capsule Place 1 capsule (18 mcg total) into inhaler and inhale daily. 30 capsule 0  . tiotropium (SPIRIVA HANDIHALER) 18 MCG inhalation capsule Place 1 capsule (18 mcg total) into inhaler and inhale daily. 90 capsule 3  . tiotropium (SPIRIVA) 18 MCG inhalation capsule Place 18 mcg into inhaler and inhale daily.     No current facility-administered medications for this encounter.    Facility-Administered Medications Ordered in Other Encounters  Medication Dose Route Frequency Provider Last Rate Last Dose  . technetium tetrofosmin (TC-MYOVIEW) injection 10 millicurie  10 millicurie Intravenous Once PRN Gilford Silvius, MD      . technetium tetrofosmin (TC-MYOVIEW) injection 30 millicurie  30 millicurie Intravenous Once PRN Pascal Lux Cathie Beams, MD        Past Medical History:  Diagnosis Date  . Allergy   . Anemia 07/25/2017  . Anxiety   . Arthritis   . Atrial fibrillation ([place])   . Cataract   . COPD (chronic obstructive pulmonary disease) ([place])   . Depression   . Diabetes mellitus  without complication ([place])   . Emphysema of lung ([place])   . Hyperlipidemia   . Hypertension   . Oxygen deficiency   . Sleep apnea     Past Surgical History:  Procedure Laterality Date  . ABDOMINAL HYSTERECTOMY     fibroids  . blood clot removal from base of brain  1990  . CARPAL TUNNEL RELEASE    . CATARACT EXTRACTION W/PHACO Left 06/16/2013   Procedure: CATARACT EXTRACTION PHACO AND INTRAOCULAR LENS PLACEMENT (IOC);  Surgeon: Tonny Branch, MD;  Location: [facility];  Service: Ophthalmology;  Laterality: Left;  CDE:  12.18  . CATARACT EXTRACTION W/PHACO Right 06/26/2013   Procedure: CATARACT EXTRACTION PHACO AND INTRAOCULAR LENS  PLACEMENT (IOC);  Surgeon: Tonny Branch, MD;  Location: [facility];  Service: Ophthalmology;  Laterality: Right;  CDE:14.71  . CESAREAN SECTION    . CHOLECYSTECTOMY    . DILATION AND CURETTAGE OF UTERUS    . EYE SURGERY  12/2016  . FOOT NEUROMA SURGERY Right   . MOUTH SURGERY    . TONSILLECTOMY      Social History   Socioeconomic History  . Marital status: Widowed    Spouse name: Not on file  . Number of children: 1  . Years of education: 91  . Highest education level: Not on file  Social Needs  . Financial resource strain: Not on file  . Food insecurity - worry: Not on file  . Food insecurity - inability: Not on file  . Transportation needs - medical: Not on file  . Transportation needs - non-medical: Not on file  Occupational History  . Occupation: retired    Comment: catering, Engineer, maintenance  Tobacco Use  . Smoking status: Former Smoker    Packs/day: 1.50    Types: Cigarettes    Start date: 09/25/1958    Last attempt to quit: 06/06/2001    Years since quitting: 16.2  . Smokeless tobacco: Never Used  Substance and Sexual Activity  . Alcohol use: No  . Drug use: No  . Sexual activity: Not Currently  Other Topics Concern  . Not on file  Social History Narrative   Lives alone   Son lives in [place]   TV, reads, puzzles     HR: 146 bpm BP: 89/61  Wt Readings from Last 3 Encounters:  08/22/17 249 lb 12.8 oz (113.3 kg)  08/21/17 250 lb (113.4 kg)  08/15/17 247 lb 12.8 oz (112.4 kg)     PHYSICAL EXAM General: NAD HEENT: Normal. Neck: No JVD, no thyromegaly. Lungs: Clear to auscultation bilaterally with normal respiratory effort. CV: Tachycardic, regular rhythm, normal S1/S2, no S3/S4, no murmur. No pretibial or periankle edema.    Abdomen: Soft, nontender, no distention.  Neurologic: Alert and oriented.  Psych: Normal affect. Skin: Normal. Musculoskeletal: No gross deformities.    ECG (independently interpreted): Rapid atrial flutter   Labs: Lab Results   Component Value Date/Time   K 4.6 07/25/2017 10:31 AM   BUN 11 07/25/2017 10:31 AM   CREATININE 0.97 07/25/2017 10:31 AM   CREATININE 1.02 (H) 06/06/2017 11:04 AM   ALT 20 07/25/2017 10:31 AM   HGB 9.5 (L) 07/25/2017 10:31 AM     Lipids: Lab Results  Component Value Date/Time   CHOL 140 06/06/2017 11:04 AM   TRIG 141 06/06/2017 11:04 AM   HDL 47 (L) 06/06/2017 11:04 AM       ASSESSMENT AND PLAN: 1. Rapid atrial flutter: As she is hypotensive, I am unable to advance rate  control medications.  I have contacted PACU.  I will proceed with elective cardioversion.  She is adequately anticoagulated on Eliquis.    Disposition: Follow up one week.   Kate Sable, M.D., F.A.C.C.

## 2017-08-27 NOTE — Anesthesia Preprocedure Evaluation (Addendum)
Anesthesia Evaluation  Patient identified by MRN, date of birth, ID band Patient awake    Airway Mallampati: I  TM Distance: >3 FB Neck ROM: Full    Dental  (+) Teeth Intact   Pulmonary sleep apnea , COPD,  COPD inhaler, former smoker,    Pulmonary exam normal        Cardiovascular Exercise Tolerance: Good hypertension, + CAD  + dysrhythmias Atrial Fibrillation  Rhythm:Regular Rate:Tachycardia     Neuro/Psych Anxiety Depression    GI/Hepatic   Endo/Other  diabetes, Well Controlled, Type 2  Renal/GU      Musculoskeletal  (+) Arthritis ,   Abdominal Normal abdominal exam  (+)   Peds  Hematology   Anesthesia Other Findings   Reproductive/Obstetrics                            Anesthesia Physical Anesthesia Plan  ASA: III and emergent  Anesthesia Plan:    Post-op Pain Management:    Induction: Intravenous  PONV Risk Score and Plan:   Airway Management Planned: Mask  Additional Equipment:   Intra-op Plan:   Post-operative Plan:   Informed Consent: I have reviewed the patients History and Physical, chart, labs and discussed the procedure including the risks, benefits and alternatives for the proposed anesthesia with the patient or authorized representative who has indicated his/her understanding and acceptance.   Dental advisory given  Plan Discussed with: CRNA  Anesthesia Plan Comments:         Anesthesia Quick Evaluation

## 2017-08-27 NOTE — Progress Notes (Signed)
While waiting to undergo elective cardioversion, she self-cardioverted to sinus rhythm. Blood pressure was normal.  I will make an EP referral to assess ablation candidacy.

## 2017-08-27 NOTE — Progress Notes (Signed)
Patent seen and examined in stress test department prior to undergoing [place]. She had her resting study prior to being seen. Baseline EKG demonstrated atrial flutter with rates between 145-148 bpm. BP 88/60. Patient was completely asymptomatic.   Test was cancelled. The findings of EKG were reported to Dr. Bronson Ing, who reviewed the EKG. He spoke with the patient and examined her as well. It has been recommended by Dr. Bronson Ing that she be scheduled for a DCCV. She has been NPO and has been taking Eliquis. She has had the test explained to her and she is willing to proceed.   Patient was taken by wheel chair to OP Surgery where she will have pre-procedure labs.

## 2017-08-27 NOTE — Progress Notes (Signed)
Patient discharged to home without complications.  Understands discharge instructions.  Neighbor Glenda her driver to home.

## 2017-08-28 ENCOUNTER — Telehealth: Payer: Self-pay

## 2017-08-28 NOTE — Telephone Encounter (Signed)
Called patient to schedule TCS/EGD +/-DIL W/ RMR PROPOFOL. She reports she needs to hold off on this procedure as she is having problems with her heart. She saw her cardiologists yesterday. She is unsure when she will be able to have this procedure done. Sending as an Pharmacist, hospital to [place].

## 2017-08-28 NOTE — Telephone Encounter (Signed)
Noted  

## 2017-08-28 NOTE — Telephone Encounter (Signed)
Lexiscan rescheduled for 12/6, pt to arrive at 8 am for 930 test,same instructions as before.LM for patient to call back to confirm with Alphonsus Sias

## 2017-08-28 NOTE — Telephone Encounter (Signed)
-----   Message from Herminio Commons, MD sent at 08/28/2017  4:32 PM EST ----- Regarding: Can we reschedule stress test? [name], This patient was supposed to have a stress test yesterday but went into rapid atrial flutter. The test was cancelled. She received a dose of the nuclear imaging agent beforehand.  Would it be possible to have her rescheduled either Dec 5 or 6? If [name], and she is agreeable, let's proceed. Otherwise, it will wait until after she sees EP. I would also have her see pulmonary for her COPD.  Linda [name]

## 2017-08-28 NOTE — Telephone Encounter (Signed)
Called patient to schedule appt for January and she did not want to schedule at this time. She reports she will call back to schedule next month when she is ready.

## 2017-08-28 NOTE — Telephone Encounter (Signed)
Noted. Please have an appt for her in January so we can reassess. Needs procedures once stable from cardiac standpoint.

## 2017-08-30 ENCOUNTER — Encounter (HOSPITAL_COMMUNITY): Payer: Medicare Other

## 2017-08-30 DIAGNOSIS — H35372 Puckering of macula, left eye: Secondary | ICD-10-CM | POA: Diagnosis not present

## 2017-08-30 DIAGNOSIS — H3581 Retinal edema: Secondary | ICD-10-CM | POA: Diagnosis not present

## 2017-08-31 ENCOUNTER — Encounter (HOSPITAL_COMMUNITY): Admission: RE | Admit: 2017-08-31 | Payer: Medicare Other | Source: Ambulatory Visit

## 2017-08-31 ENCOUNTER — Encounter: Payer: Self-pay | Admitting: Adult Health

## 2017-08-31 ENCOUNTER — Ambulatory Visit (HOSPITAL_COMMUNITY)
Admission: RE | Admit: 2017-08-31 | Payer: Medicare Other | Source: Ambulatory Visit | Attending: Cardiovascular Disease | Admitting: Cardiovascular Disease

## 2017-08-31 NOTE — Progress Notes (Signed)
Patient arrived this a.m. for Lexiscan stress Myoview.  Found to be in atrial fib flutter RVR heart rate of 120-140 bpm.  On interrogation of the patient she had not been taking metoprolol as directed.  She states that she was told not to take it when she was last seen for cardioversion.  Cardioversion was canceled as she went back into normal sinus rhythm on her own without intervention.  The patient will not be able to undergo Lexiscan stress test.  This is been discussed with Dr. Bronson Ing.  The patient has been restarted on metoprolol 75 mg in the morning and 50 mg in the evening.  The patient has been referred to Dr. Joylene Grapes, for EP evaluation.  Appointment has been made for December 31 at 2:30 PM at the [employer] [facility] office.  I have discussed this with the patient to include her medications.  She will follow-up with Dr. Rayann Heman and follow-up appointment previously scheduled with Dr. Bronson Ing in March 2019.

## 2017-09-24 ENCOUNTER — Encounter: Payer: Self-pay | Admitting: Internal Medicine

## 2017-09-24 ENCOUNTER — Ambulatory Visit (INDEPENDENT_AMBULATORY_CARE_PROVIDER_SITE_OTHER): Payer: Medicare Other | Admitting: Internal Medicine

## 2017-09-24 VITALS — BP 124/60 | HR 65 | Ht 62.0 in | Wt 249.0 lb

## 2017-09-24 DIAGNOSIS — I4892 Unspecified atrial flutter: Secondary | ICD-10-CM

## 2017-09-24 DIAGNOSIS — G4733 Obstructive sleep apnea (adult) (pediatric): Secondary | ICD-10-CM | POA: Diagnosis not present

## 2017-09-24 DIAGNOSIS — I483 Typical atrial flutter: Secondary | ICD-10-CM | POA: Diagnosis not present

## 2017-09-24 NOTE — H&P (View-Only) (Signed)
Electrophysiology Office Note   Date:  09/24/2017   ID:  Linda [name], DOB 05/26/1946, MRN [id number]  PCP:  [name], MD  Cardiologist:  Dr Bronson Ing Primary Electrophysiologist: Thompson Grayer, MD    CC: atrial flutter   History of Present Illness: Linda [name] is a 71 y.o. female who presents today for electrophysiology evaluation.   The patient is referred by Dr Bronson Ing for EP consultation regarding atrial flutter.  The patient has multiple chronic comoribidities including COPD, morbid obesity, sleep apnea (not compliant with therapy), and moderate pulmonary hypertension.  Though she carries a diagnosis of atrial fibrillation, I do not see that this has been well documented. She recently was evaluated by Dr Bronson Ing for SOB and had stress test ordered.  Upon arrival for stress testing 08/28/17, she was found to be in typical appearing atrial flutter  (strip is available in epic under CV strips tab). She was asymptomatic however her V rates were 140-150 bpm.  She was referred to the hospital for urgent cardioversion but had converted to sinus rhythm by the time that she arrived.  She is anticoagulated with eliquis.  She is not very active.  She wears O2 and is in a wheelchair today.  Today, she denies symptoms of palpitations, chest pain,   orthopnea, PND, lower extremity edema, claudication, dizziness, presyncope, syncope, bleeding, or neurologic sequela. The patient is tolerating medications without difficulties and is otherwise without complaint today.    Past Medical History:  Diagnosis Date  . Allergy   . Anemia 07/25/2017  . Anxiety   . Arthritis   . Atrial fibrillation ([place])   . Cataract   . COPD (chronic obstructive pulmonary disease) ([place])   . Depression   . Diabetes mellitus without complication ([place])   . Emphysema of lung ([place])   . Hyperlipidemia   . Hypertension   . Oxygen deficiency   . Sleep apnea   . Typical atrial flutter [facility])    Past  Surgical History:  Procedure Laterality Date  . ABDOMINAL HYSTERECTOMY     fibroids  . blood clot removal from base of brain  1990  . CARPAL TUNNEL RELEASE    . CATARACT EXTRACTION W/PHACO Left 06/16/2013   Procedure: CATARACT EXTRACTION PHACO AND INTRAOCULAR LENS PLACEMENT (IOC);  Surgeon: Tonny Branch, MD;  Location: [facility];  Service: Ophthalmology;  Laterality: Left;  CDE:  12.18  . CATARACT EXTRACTION W/PHACO Right 06/26/2013   Procedure: CATARACT EXTRACTION PHACO AND INTRAOCULAR LENS PLACEMENT (IOC);  Surgeon: Tonny Branch, MD;  Location: [facility];  Service: Ophthalmology;  Laterality: Right;  CDE:14.71  . CESAREAN SECTION    . CHOLECYSTECTOMY    . DILATION AND CURETTAGE OF UTERUS    . EYE SURGERY  12/2016  . FOOT NEUROMA SURGERY Right   . MOUTH SURGERY    . TONSILLECTOMY       Current Outpatient Medications  Medication Sig Dispense Refill  . acetaminophen (TYLENOL) 500 MG tablet Take 500 mg by mouth daily as needed for pain.    Marland Kitchen albuterol (PROVENTIL HFA;VENTOLIN HFA) 108 (90 Base) MCG/ACT inhaler Inhale 1 puff into the lungs as needed for wheezing or shortness of breath.    Marland Kitchen apixaban (ELIQUIS) 5 MG TABS tablet Take 1 tablet (5 mg total) by mouth 2 (two) times daily. 180 tablet 3  . cetirizine (ZYRTEC) 10 MG tablet Take 10 mg by mouth daily.    . cholecalciferol (VITAMIN D) 1000 units tablet Take 2,000 Units by mouth  daily.    . citalopram (CELEXA) 20 MG tablet Take 1 tablet (20 mg total) by mouth daily. 90 tablet 3  . diphenhydrAMINE (SOMINEX) 25 MG tablet Take 25 mg by mouth at bedtime as needed for sleep.    Marland Kitchen Fexofenadine HCl (MUCINEX ALLERGY PO) Take 1 tablet by mouth as needed.    . Fluticasone-Salmeterol (ADVAIR DISKUS) 250-50 MCG/DOSE AEPB Inhale 1 puff into the lungs 2 (two) times daily.    . furosemide (LASIX) 20 MG tablet Take 1 tablet (20 mg total) by mouth as needed. 30 tablet 1  . metFORMIN (GLUCOPHAGE) 1000 MG tablet Take 1,000 mg by mouth daily.    . metoprolol  succinate (TOPROL-XL) 50 MG 24 hr tablet Take 1 1/2 tabs (75mg ) every morning & 1 tab (50mg ) every evening (Patient taking differently: Take 1 1/2 tabs (75mg ) every morning & 1/2 tab (25mg ) every evening) 225 tablet 3  . pravastatin (PRAVACHOL) 20 MG tablet Take 1 tablet (20 mg total) daily by mouth. 90 tablet 3  . tiotropium (SPIRIVA HANDIHALER) 18 MCG inhalation capsule Place 1 capsule (18 mcg total) into inhaler and inhale daily. 30 capsule 0  . tiotropium (SPIRIVA HANDIHALER) 18 MCG inhalation capsule Place 1 capsule (18 mcg total) into inhaler and inhale daily. 90 capsule 3  . tiotropium (SPIRIVA) 18 MCG inhalation capsule Place 18 mcg into inhaler and inhale daily.     No current facility-administered medications for this visit.     Allergies:   Trazodone and nefazodone; Adhesive [tape]; Bactrim [sulfamethoxazole-trimethoprim]; Penicillins; and Ppd [tuberculin purified protein derivative]   Social History:  The patient  reports that she quit smoking about 16 years ago. Her smoking use included cigarettes. She started smoking about 59 years ago. She smoked 1.50 packs per day. she has never used smokeless tobacco. She reports that she does not drink alcohol or use drugs.   Family History:  The patient's  family history includes Alcohol abuse in her mother; Arthritis in her mother; COPD in her brother and mother; Cancer in her father; Depression in her mother; Diabetes in her brother; Emphysema in her mother; Heart attack in her father; Heart disease in her father; Hyperlipidemia in her mother; Hypertension in her mother; Other in her maternal grandmother.    ROS:  Please see the history of present illness.   All other systems are personally reviewed and negative.    PHYSICAL EXAM: VS:  BP 124/60   Pulse 65   Ht 5\' 2"  (1.575 m)   Wt 249 lb (112.9 kg)   BMI 45.54 kg/m  , BMI Body mass index is 45.54 kg/m. GEN: morbidly obese, in no acute distress, in a wheel chair today HEENT: normal    Neck: no JVD, carotid bruits, or masses Cardiac: RRR; no murmurs, rubs, or gallops,no edema  Respiratory:  clear to auscultation bilaterally, normal work of breathing, wearing O2 today GI: soft, nontender, nondistended, + BS MS: no deformity or atrophy  Skin: warm and dry  Neuro:  Strength and sensation are intact Psych: euthymic mood, full affect  EKG:  EKG is ordered today. The ekg ordered today is personally reviewed and shows sinus rhythm 65 bpm, PR 124 msec, QRS 80 msec, Qtc 461 msec   Recent Labs: 08/27/2017: ALT 16; BUN 13; Creatinine, Ser 0.97; Hemoglobin 11.1; Platelets 188; Potassium 3.8; Sodium 136  personally reviewed   Lipid Panel     Component Value Date/Time   CHOL 140 06/06/2017 1104   TRIG 141 06/06/2017 1104  HDL 47 (L) 06/06/2017 1104   CHOLHDL 3.0 06/06/2017 1104   personally reviewed   Wt Readings from Last 3 Encounters:  09/24/17 249 lb (112.9 kg)  08/22/17 249 lb 12.8 oz (113.3 kg)  08/21/17 250 lb (113.4 kg)      Other studies personally reviewed: Additional studies/ records that were reviewed today include: Dr Court Joy notes, prior echo  Review of the above records today demonstrates: as above   ASSESSMENT AND PLAN:  1.  Typical atrial flutter The patient recently presented with atrial flutter with rapid ventricular rates.  She is referred by Dr Bronson Ing for further evaluation/ management.  Given very fast ventricular rates, I think that catheter ablation for atrial flutter is very reasonable. Therapeutic strategies for atrial flutter including medicine and ablation were discussed in detail with the patient today. Risk, benefits, and alternatives to EP study and radiofrequency ablation were also discussed in detail today. These risks include but are not limited to stroke, bleeding, vascular damage, tamponade, perforation, damage to the heart and other structures, AV block requiring pacemaker, worsening renal function, and death. The patient  understands these risk and wishes to proceed.  We will therefore proceed with catheter ablation at the next available time.  No medicine changes are made today.  2. ? H/o afib Though she carries a history of atrial fibrillation, I do not see this well documented.  We discussed at length that she is at high risk for afib given her obesity, sleep apnea, and COPD.  Lifestyle modification was encouraged. Would not advise atrial fibriillation ablation currently.  Could consider ILR post atrial flutter ablation to determine future management.    3. Obesity Body mass index is 45.54 kg/m. Lifestyle modification discussed at length.  She has been very diligent with efforts at weight loss without success. I will refer her to Dr Hassell Done with the [facility] Bariatric program.  She seems motivated and interested in surgery.  4. OSA Not compliant with therapy I worry that this will be a big issue for her as she already has atrial arrhythmias and at least moderate pulmonary hypertension. Importance of compliance was discussed today.  She has been referred to pulmonary.  Hopefully they can assist her with CPAP titration/ management  5. SOB Likely multifactorial My suspicion for CAD is low Would obtain stress test as soon as able (previously ordered) however I do not feel that this has to be completed prior to her ablation.    Current medicines are reviewed at length with the patient today.   The patient does not have concerns regarding her medicines.  The following changes were made today:  none   Signed, Thompson Grayer, MD  09/24/2017 3:15 PM     [address] [place] [address] [phone] (office) [phone] (fax)

## 2017-09-24 NOTE — Patient Instructions (Addendum)
Medication Instructions:  Your physician recommends that you continue on your current medications as directed. Please refer to the Current Medication list given to you today.   Labwork: Your physician recommends that you return for lab work today: CBC/BMP   Testing/Procedures: Your physician has recommended that you have an ablation. Catheter ablation is a medical procedure used to treat some cardiac arrhythmias (irregular heartbeats). During catheter ablation, a long, thin, flexible tube is put into a blood vessel in your groin (upper thigh), or neck. This tube is called an ablation catheter. It is then guided to your heart through the blood vessel. Radio frequency waves destroy small areas of heart tissue where abnormal heartbeats may cause an arrhythmia to start. Please see the instruction sheet given to you today.--10/04/17  Please arrive at [facility] at 7:30am Do not eat or drink after midnight the night prior to the procedure Do not take any medications the morning of the test Plan for one night stay Will need someone to drive you home at discharge      Follow-Up: Your physician recommends that you schedule a follow-up appointment in: 4 weeks from 10/04/17 with Dr Rayann Heman  Dennis Bast have been referred to Dr Hassell Done for weight loss clinic    Any Other Special Instructions Will Be Listed Below (If Applicable).     If you need a refill on your cardiac medications before your next appointment, please call your pharmacy.

## 2017-09-24 NOTE — Progress Notes (Signed)
Electrophysiology Office Note   Date:  09/24/2017   ID:  MANAMI TUTOR, DOB 1946/04/18, MRN [id number]  PCP:  [name], MD  Cardiologist:  Dr Bronson Ing Primary Electrophysiologist: Thompson Grayer, MD    CC: atrial flutter   History of Present Illness: Linda [name] is a 71 y.o. female who presents today for electrophysiology evaluation.   The patient is referred by Dr Bronson Ing for EP consultation regarding atrial flutter.  The patient has multiple chronic comoribidities including COPD, morbid obesity, sleep apnea (not compliant with therapy), and moderate pulmonary hypertension.  Though she carries a diagnosis of atrial fibrillation, I do not see that this has been well documented. She recently was evaluated by Dr Bronson Ing for SOB and had stress test ordered.  Upon arrival for stress testing 08/28/17, she was found to be in typical appearing atrial flutter  (strip is available in epic under CV strips tab). She was asymptomatic however her V rates were 140-150 bpm.  She was referred to the hospital for urgent cardioversion but had converted to sinus rhythm by the time that she arrived.  She is anticoagulated with eliquis.  She is not very active.  She wears O2 and is in a wheelchair today.  Today, she denies symptoms of palpitations, chest pain,   orthopnea, PND, lower extremity edema, claudication, dizziness, presyncope, syncope, bleeding, or neurologic sequela. The patient is tolerating medications without difficulties and is otherwise without complaint today.    Past Medical History:  Diagnosis Date  . Allergy   . Anemia 07/25/2017  . Anxiety   . Arthritis   . Atrial fibrillation ([place])   . Cataract   . COPD (chronic obstructive pulmonary disease) ([place])   . Depression   . Diabetes mellitus without complication ([place])   . Emphysema of lung ([place])   . Hyperlipidemia   . Hypertension   . Oxygen deficiency   . Sleep apnea   . Typical atrial flutter [facility])    Past  Surgical History:  Procedure Laterality Date  . ABDOMINAL HYSTERECTOMY     fibroids  . blood clot removal from base of brain  1990  . CARPAL TUNNEL RELEASE    . CATARACT EXTRACTION W/PHACO Left 06/16/2013   Procedure: CATARACT EXTRACTION PHACO AND INTRAOCULAR LENS PLACEMENT (IOC);  Surgeon: Tonny Branch, MD;  Location: [facility];  Service: Ophthalmology;  Laterality: Left;  CDE:  12.18  . CATARACT EXTRACTION W/PHACO Right 06/26/2013   Procedure: CATARACT EXTRACTION PHACO AND INTRAOCULAR LENS PLACEMENT (IOC);  Surgeon: Tonny Branch, MD;  Location: [facility];  Service: Ophthalmology;  Laterality: Right;  CDE:14.71  . CESAREAN SECTION    . CHOLECYSTECTOMY    . DILATION AND CURETTAGE OF UTERUS    . EYE SURGERY  12/2016  . FOOT NEUROMA SURGERY Right   . MOUTH SURGERY    . TONSILLECTOMY       Current Outpatient Medications  Medication Sig Dispense Refill  . acetaminophen (TYLENOL) 500 MG tablet Take 500 mg by mouth daily as needed for pain.    Marland Kitchen albuterol (PROVENTIL HFA;VENTOLIN HFA) 108 (90 Base) MCG/ACT inhaler Inhale 1 puff into the lungs as needed for wheezing or shortness of breath.    Marland Kitchen apixaban (ELIQUIS) 5 MG TABS tablet Take 1 tablet (5 mg total) by mouth 2 (two) times daily. 180 tablet 3  . cetirizine (ZYRTEC) 10 MG tablet Take 10 mg by mouth daily.    . cholecalciferol (VITAMIN D) 1000 units tablet Take 2,000 Units by mouth  daily.    . citalopram (CELEXA) 20 MG tablet Take 1 tablet (20 mg total) by mouth daily. 90 tablet 3  . diphenhydrAMINE (SOMINEX) 25 MG tablet Take 25 mg by mouth at bedtime as needed for sleep.    Marland Kitchen Fexofenadine HCl (MUCINEX ALLERGY PO) Take 1 tablet by mouth as needed.    . Fluticasone-Salmeterol (ADVAIR DISKUS) 250-50 MCG/DOSE AEPB Inhale 1 puff into the lungs 2 (two) times daily.    . furosemide (LASIX) 20 MG tablet Take 1 tablet (20 mg total) by mouth as needed. 30 tablet 1  . metFORMIN (GLUCOPHAGE) 1000 MG tablet Take 1,000 mg by mouth daily.    . metoprolol  succinate (TOPROL-XL) 50 MG 24 hr tablet Take 1 1/2 tabs (75mg ) every morning & 1 tab (50mg ) every evening (Patient taking differently: Take 1 1/2 tabs (75mg ) every morning & 1/2 tab (25mg ) every evening) 225 tablet 3  . pravastatin (PRAVACHOL) 20 MG tablet Take 1 tablet (20 mg total) daily by mouth. 90 tablet 3  . tiotropium (SPIRIVA HANDIHALER) 18 MCG inhalation capsule Place 1 capsule (18 mcg total) into inhaler and inhale daily. 30 capsule 0  . tiotropium (SPIRIVA HANDIHALER) 18 MCG inhalation capsule Place 1 capsule (18 mcg total) into inhaler and inhale daily. 90 capsule 3  . tiotropium (SPIRIVA) 18 MCG inhalation capsule Place 18 mcg into inhaler and inhale daily.     No current facility-administered medications for this visit.     Allergies:   Trazodone and nefazodone; Adhesive [tape]; Bactrim [sulfamethoxazole-trimethoprim]; Penicillins; and Ppd [tuberculin purified protein derivative]   Social History:  The patient  reports that she quit smoking about 16 years ago. Her smoking use included cigarettes. She started smoking about 59 years ago. She smoked 1.50 packs per day. she has never used smokeless tobacco. She reports that she does not drink alcohol or use drugs.   Family History:  The patient's  family history includes Alcohol abuse in her mother; Arthritis in her mother; COPD in her brother and mother; Cancer in her father; Depression in her mother; Diabetes in her brother; Emphysema in her mother; Heart attack in her father; Heart disease in her father; Hyperlipidemia in her mother; Hypertension in her mother; Other in her maternal grandmother.    ROS:  Please see the history of present illness.   All other systems are personally reviewed and negative.    PHYSICAL EXAM: VS:  BP 124/60   Pulse 65   Ht 5\' 2"  (1.575 m)   Wt 249 lb (112.9 kg)   BMI 45.54 kg/m  , BMI Body mass index is 45.54 kg/m. GEN: morbidly obese, in no acute distress, in a wheel chair today HEENT: normal    Neck: no JVD, carotid bruits, or masses Cardiac: RRR; no murmurs, rubs, or gallops,no edema  Respiratory:  clear to auscultation bilaterally, normal work of breathing, wearing O2 today GI: soft, nontender, nondistended, + BS MS: no deformity or atrophy  Skin: warm and dry  Neuro:  Strength and sensation are intact Psych: euthymic mood, full affect  EKG:  EKG is ordered today. The ekg ordered today is personally reviewed and shows sinus rhythm 65 bpm, PR 124 msec, QRS 80 msec, Qtc 461 msec   Recent Labs: 08/27/2017: ALT 16; BUN 13; Creatinine, Ser 0.97; Hemoglobin 11.1; Platelets 188; Potassium 3.8; Sodium 136  personally reviewed   Lipid Panel     Component Value Date/Time   CHOL 140 06/06/2017 1104   TRIG 141 06/06/2017 1104  HDL 47 (L) 06/06/2017 1104   CHOLHDL 3.0 06/06/2017 1104   personally reviewed   Wt Readings from Last 3 Encounters:  09/24/17 249 lb (112.9 kg)  08/22/17 249 lb 12.8 oz (113.3 kg)  08/21/17 250 lb (113.4 kg)      Other studies personally reviewed: Additional studies/ records that were reviewed today include: Dr Court Joy notes, prior echo  Review of the above records today demonstrates: as above   ASSESSMENT AND PLAN:  1.  Typical atrial flutter The patient recently presented with atrial flutter with rapid ventricular rates.  She is referred by Dr Bronson Ing for further evaluation/ management.  Given very fast ventricular rates, I think that catheter ablation for atrial flutter is very reasonable. Therapeutic strategies for atrial flutter including medicine and ablation were discussed in detail with the patient today. Risk, benefits, and alternatives to EP study and radiofrequency ablation were also discussed in detail today. These risks include but are not limited to stroke, bleeding, vascular damage, tamponade, perforation, damage to the heart and other structures, AV block requiring pacemaker, worsening renal function, and death. The patient  understands these risk and wishes to proceed.  We will therefore proceed with catheter ablation at the next available time.  No medicine changes are made today.  2. ? H/o afib Though she carries a history of atrial fibrillation, I do not see this well documented.  We discussed at length that she is at high risk for afib given her obesity, sleep apnea, and COPD.  Lifestyle modification was encouraged. Would not advise atrial fibriillation ablation currently.  Could consider ILR post atrial flutter ablation to determine future management.    3. Obesity Body mass index is 45.54 kg/m. Lifestyle modification discussed at length.  She has been very diligent with efforts at weight loss without success. I will refer her to Dr Hassell Done with the [facility] Bariatric program.  She seems motivated and interested in surgery.  4. OSA Not compliant with therapy I worry that this will be a big issue for her as she already has atrial arrhythmias and at least moderate pulmonary hypertension. Importance of compliance was discussed today.  She has been referred to pulmonary.  Hopefully they can assist her with CPAP titration/ management  5. SOB Likely multifactorial My suspicion for CAD is low Would obtain stress test as soon as able (previously ordered) however I do not feel that this has to be completed prior to her ablation.    Current medicines are reviewed at length with the patient today.   The patient does not have concerns regarding her medicines.  The following changes were made today:  none   Signed, Thompson Grayer, MD  09/24/2017 3:15 PM     [address] [place] [address] [phone] (office) [phone] (fax)

## 2017-09-25 LAB — CBC WITH DIFFERENTIAL/PLATELET
BASOS ABS: 0 10*3/uL (ref 0.0–0.2)
Basos: 0 %
EOS (ABSOLUTE): 0.1 10*3/uL (ref 0.0–0.4)
Eos: 2 %
Hematocrit: 38.1 % (ref 34.0–46.6)
Hemoglobin: 11.2 g/dL (ref 11.1–15.9)
IMMATURE GRANS (ABS): 0 10*3/uL (ref 0.0–0.1)
Immature Granulocytes: 0 %
LYMPHS: 19 %
Lymphocytes Absolute: 1.3 10*3/uL (ref 0.7–3.1)
MCH: 26.7 pg (ref 26.6–33.0)
MCHC: 29.4 g/dL — AB (ref 31.5–35.7)
MCV: 91 fL (ref 79–97)
Monocytes Absolute: 0.4 10*3/uL (ref 0.1–0.9)
Monocytes: 6 %
NEUTROS ABS: 4.7 10*3/uL (ref 1.4–7.0)
NEUTROS PCT: 73 %
PLATELETS: 155 10*3/uL (ref 150–379)
RBC: 4.19 x10E6/uL (ref 3.77–5.28)
RDW: 18.5 % — ABNORMAL HIGH (ref 12.3–15.4)
WBC: 6.5 10*3/uL (ref 3.4–10.8)

## 2017-09-25 LAB — BASIC METABOLIC PANEL
BUN/Creatinine Ratio: 19 (ref 12–28)
BUN: 18 mg/dL (ref 8–27)
CALCIUM: 9.5 mg/dL (ref 8.7–10.3)
CHLORIDE: 97 mmol/L (ref 96–106)
CO2: 26 mmol/L (ref 20–29)
Creatinine, Ser: 0.95 mg/dL (ref 0.57–1.00)
GFR calc non Af Amer: 61 mL/min/{1.73_m2} (ref >59)
GFR, EST AFRICAN AMERICAN: 70 mL/min/{1.73_m2} (ref >59)
GLUCOSE: 211 mg/dL — AB (ref 65–99)
POTASSIUM: 3.9 mmol/L (ref 3.5–5.2)
Sodium: 140 mmol/L (ref 134–144)

## 2017-09-26 ENCOUNTER — Telehealth: Payer: Self-pay | Admitting: Internal Medicine

## 2017-09-26 NOTE — Telephone Encounter (Signed)
New Message     Patient said that she lost her certification for the CPAP, so she can not get supplies, she has to have another sleep study done, she needs a referral for another sleep study.  It has been over a year since she had her certification done.

## 2017-09-26 NOTE — Telephone Encounter (Signed)
Last office note stated patient was intolerant to CPAP.  Patient stated that she was not taking the issue serious enough.  Machine just drives her crazy.  After discussing with Dr. Rayann Heman, he explained the importance of CPAP in regards to her heart function & her atrial flutter.  Stated that [facility] has told her that she needs to have another sleep study for recertification since she had been negligent with machine & monitoring in the past.  Last sleep study done 04/29/2017 per patient.  Patient is requesting referral for sleep study.  She is agreeable to going to [place] if necessary.  Informed patient that Dr. Bronson Ing is out of the office till next week - patient is okay to wait till then for okay on this.

## 2017-10-01 NOTE — Telephone Encounter (Signed)
That would be fine 

## 2017-10-03 DIAGNOSIS — M792 Neuralgia and neuritis, unspecified: Secondary | ICD-10-CM | POA: Diagnosis not present

## 2017-10-03 DIAGNOSIS — E1140 Type 2 diabetes mellitus with diabetic neuropathy, unspecified: Secondary | ICD-10-CM | POA: Diagnosis not present

## 2017-10-03 DIAGNOSIS — B351 Tinea unguium: Secondary | ICD-10-CM | POA: Diagnosis not present

## 2017-10-03 DIAGNOSIS — M79671 Pain in right foot: Secondary | ICD-10-CM | POA: Diagnosis not present

## 2017-10-04 ENCOUNTER — Ambulatory Visit (HOSPITAL_COMMUNITY)
Admission: RE | Admit: 2017-10-04 | Discharge: 2017-10-04 | Disposition: A | Discharge status: Home / Self Care | Payer: Medicare Other | Source: Ambulatory Visit | Attending: Internal Medicine | Admitting: Internal Medicine

## 2017-10-04 ENCOUNTER — Ambulatory Visit (HOSPITAL_COMMUNITY): Payer: Medicare Other | Admitting: Certified Registered Nurse Anesthetist

## 2017-10-04 ENCOUNTER — Encounter (HOSPITAL_COMMUNITY)
Admission: RE | Disposition: A | Discharge status: Home / Self Care | Payer: Self-pay | Source: Ambulatory Visit | Attending: Internal Medicine

## 2017-10-04 DIAGNOSIS — E785 Hyperlipidemia, unspecified: Secondary | ICD-10-CM | POA: Diagnosis not present

## 2017-10-04 DIAGNOSIS — Z87891 Personal history of nicotine dependence: Secondary | ICD-10-CM | POA: Diagnosis not present

## 2017-10-04 DIAGNOSIS — Z7984 Long term (current) use of oral hypoglycemic drugs: Secondary | ICD-10-CM | POA: Diagnosis not present

## 2017-10-04 DIAGNOSIS — J439 Emphysema, unspecified: Secondary | ICD-10-CM | POA: Insufficient documentation

## 2017-10-04 DIAGNOSIS — I10 Essential (primary) hypertension: Secondary | ICD-10-CM | POA: Insufficient documentation

## 2017-10-04 DIAGNOSIS — Z880 Allergy status to penicillin: Secondary | ICD-10-CM | POA: Insufficient documentation

## 2017-10-04 DIAGNOSIS — Z9981 Dependence on supplemental oxygen: Secondary | ICD-10-CM | POA: Insufficient documentation

## 2017-10-04 DIAGNOSIS — Z6841 Body Mass Index (BMI) 40.0 and over, adult: Secondary | ICD-10-CM | POA: Insufficient documentation

## 2017-10-04 DIAGNOSIS — G4733 Obstructive sleep apnea (adult) (pediatric): Secondary | ICD-10-CM | POA: Insufficient documentation

## 2017-10-04 DIAGNOSIS — Z79899 Other long term (current) drug therapy: Secondary | ICD-10-CM | POA: Diagnosis not present

## 2017-10-04 DIAGNOSIS — I483 Typical atrial flutter: Secondary | ICD-10-CM | POA: Diagnosis not present

## 2017-10-04 DIAGNOSIS — E119 Type 2 diabetes mellitus without complications: Secondary | ICD-10-CM | POA: Diagnosis not present

## 2017-10-04 DIAGNOSIS — Z7901 Long term (current) use of anticoagulants: Secondary | ICD-10-CM | POA: Insufficient documentation

## 2017-10-04 DIAGNOSIS — I4892 Unspecified atrial flutter: Secondary | ICD-10-CM | POA: Diagnosis not present

## 2017-10-04 HISTORY — PX: A-FLUTTER ABLATION: EP1230

## 2017-10-04 LAB — GLUCOSE, CAPILLARY
GLUCOSE-CAPILLARY: 181 mg/dL — AB (ref 65–99)
GLUCOSE-CAPILLARY: 171 mg/dL — AB (ref 65–99)

## 2017-10-04 SURGERY — A-FLUTTER ABLATION
Anesthesia: General

## 2017-10-04 MED ORDER — BUPIVACAINE HCL (PF) 0.25 % IJ SOLN
INTRAMUSCULAR | Status: DC | PRN
  Administered 2017-10-04: 30 mL

## 2017-10-04 MED ORDER — HYDROCODONE-ACETAMINOPHEN 5-325 MG PO TABS: 1.0000 | ORAL_TABLET | ORAL | Status: DC | PRN

## 2017-10-04 MED ORDER — LIDOCAINE HCL (CARDIAC) 20 MG/ML IV SOLN
INTRAVENOUS | Status: DC | PRN
  Administered 2017-10-04: 30 mg via INTRATRACHEAL

## 2017-10-04 MED ORDER — SODIUM CHLORIDE 0.9% FLUSH: 3.0000 mL | INTRAVENOUS | Status: DC | PRN

## 2017-10-04 MED ORDER — SODIUM CHLORIDE 0.9 % IV SOLN
INTRAVENOUS | Status: DC
  Administered 2017-10-04: 08:00:00 via INTRAVENOUS

## 2017-10-04 MED ORDER — ALBUTEROL SULFATE HFA 108 (90 BASE) MCG/ACT IN AERS
INHALATION_SPRAY | RESPIRATORY_TRACT | Status: DC | PRN
  Administered 2017-10-04: 2 via RESPIRATORY_TRACT

## 2017-10-04 MED ORDER — ISOPROTERENOL HCL 0.2 MG/ML IJ SOLN
INTRAVENOUS | Status: DC | PRN
  Administered 2017-10-04: 4 ug/min via INTRAVENOUS

## 2017-10-04 MED ORDER — PROPOFOL 10 MG/ML IV BOLUS
INTRAVENOUS | Status: DC | PRN
  Administered 2017-10-04: 160 mg via INTRAVENOUS

## 2017-10-04 MED ORDER — SODIUM CHLORIDE 0.9 % IV SOLN: 250.0000 mL | INTRAVENOUS | Status: DC | PRN

## 2017-10-04 MED ORDER — ONDANSETRON HCL 4 MG/2ML IJ SOLN
INTRAMUSCULAR | Status: DC | PRN
  Administered 2017-10-04: 4 mg via INTRAVENOUS

## 2017-10-04 MED ORDER — ISOPROTERENOL HCL 0.2 MG/ML IJ SOLN
INTRAMUSCULAR | Status: AC
  Filled 2017-10-04: qty 5

## 2017-10-04 MED ORDER — SODIUM CHLORIDE 0.9% FLUSH: 3.0000 mL | Freq: Two times a day (BID) | INTRAVENOUS | Status: DC

## 2017-10-04 MED ORDER — SUGAMMADEX SODIUM 200 MG/2ML IV SOLN
INTRAVENOUS | Status: DC | PRN
  Administered 2017-10-04: 200 mg via INTRAVENOUS

## 2017-10-04 MED ORDER — ROCURONIUM BROMIDE 100 MG/10ML IV SOLN
INTRAVENOUS | Status: DC | PRN
  Administered 2017-10-04: 60 mg via INTRAVENOUS

## 2017-10-04 MED ORDER — EPHEDRINE SULFATE 50 MG/ML IJ SOLN
INTRAMUSCULAR | Status: DC | PRN
  Administered 2017-10-04: 5 mg via INTRAVENOUS

## 2017-10-04 MED ORDER — MIDAZOLAM HCL 5 MG/5ML IJ SOLN
INTRAMUSCULAR | Status: DC | PRN
  Administered 2017-10-04: 1 mg via INTRAVENOUS

## 2017-10-04 MED ORDER — PHENYLEPHRINE HCL 10 MG/ML IJ SOLN
INTRAMUSCULAR | Status: DC | PRN
  Administered 2017-10-04 (×5): 80 ug via INTRAVENOUS

## 2017-10-04 MED ORDER — FENTANYL CITRATE (PF) 100 MCG/2ML IJ SOLN
INTRAMUSCULAR | Status: DC | PRN
  Administered 2017-10-04 (×2): 25 ug via INTRAVENOUS
  Administered 2017-10-04: 50 ug via INTRAVENOUS

## 2017-10-04 MED ORDER — BUPIVACAINE HCL (PF) 0.25 % IJ SOLN
INTRAMUSCULAR | Status: AC
  Filled 2017-10-04: qty 30

## 2017-10-04 SURGICAL SUPPLY — 10 items
BAG SNAP BAND KOVER 36X36 (MISCELLANEOUS) ×3 IMPLANT
BLANKET WARM UNDERBOD FULL ACC (MISCELLANEOUS) ×3 IMPLANT
CATH EZ STEER NAV 8MM F-J CUR (ABLATOR) ×3 IMPLANT
CATH WEBSTER BI DIR CS D-F CRV (CATHETERS) ×3 IMPLANT
PACK EP LATEX FREE (CUSTOM PROCEDURE TRAY) ×2
PACK EP LF (CUSTOM PROCEDURE TRAY) ×1 IMPLANT
PAD DEFIB LIFELINK (PAD) ×3 IMPLANT
PATCH CARTO3 (PAD) ×3 IMPLANT
SHEATH PINNACLE 7F 10CM (SHEATH) ×3 IMPLANT
SHEATH PINNACLE 8F 10CM (SHEATH) ×3 IMPLANT

## 2017-10-04 NOTE — Telephone Encounter (Signed)
Left message to return call 

## 2017-10-04 NOTE — Anesthesia Procedure Notes (Signed)
Procedure Name: Intubation Date/Time: 10/04/2017 10:10 AM Performed by: Shirlyn Goltz, CRNA Pre-anesthesia Checklist: Patient identified, Emergency Drugs available, Suction available and Patient being monitored Patient Re-evaluated:Patient Re-evaluated prior to induction Oxygen Delivery Method: Circle system utilized Preoxygenation: Pre-oxygenation with 100% oxygen Induction Type: IV induction Ventilation: Mask ventilation without difficulty Laryngoscope Size: Mac and 3 Grade View: Grade II Tube type: Oral Tube size: 7.0 mm Number of attempts: 1 Airway Equipment and Method: Stylet and LTA kit utilized Placement Confirmation: ETT inserted through vocal cords under direct vision,  positive ETCO2 and breath sounds checked- equal and bilateral Secured at: 21 cm Tube secured with: Tape Dental Injury: Teeth and Oropharynx as per pre-operative assessment

## 2017-10-04 NOTE — Interval H&P Note (Signed)
History and Physical Interval Note:  10/04/2017 7:46 AM  Linda [name]  has presented today for surgery, with the diagnosis of flutter  The various methods of treatment have been discussed with the patient and family. After consideration of risks, benefits and other options for treatment, the patient has consented to  Procedure(s): A-FLUTTER ABLATION (N/A) as a surgical intervention .  The patient's history has been reviewed, patient examined, no change in status, stable for surgery.  I have reviewed the patient's chart and labs.  Questions were answered to the patient's satisfaction.     Thompson Grayer

## 2017-10-04 NOTE — Transfer of Care (Signed)
Immediate Anesthesia Transfer of Care Note  Patient: Linda [name]  Procedure(s) Performed: A-FLUTTER ABLATION (N/A )  Patient Location: PACU and Cath Lab  Anesthesia Type:General  Level of Consciousness: awake, alert , oriented and patient cooperative  Airway & Oxygen Therapy: Patient Spontanous Breathing and Patient connected to nasal cannula oxygen  Post-op Assessment: Report given to RN and Post -op Vital signs reviewed and stable  Post vital signs: Reviewed and stable  Last Vitals:  Vitals:   10/04/17 0748  BP: 123/60  Pulse: 69  Resp: 16  Temp: 36.9 C  SpO2: 94%    Last Pain:  Vitals:   10/04/17 0748  TempSrc: Oral         Complications: No apparent anesthesia complications

## 2017-10-04 NOTE — Progress Notes (Signed)
Dr. Rayann Heman in to see pt. OK to D/C home.

## 2017-10-04 NOTE — Anesthesia Preprocedure Evaluation (Signed)
Anesthesia Evaluation  Patient identified by MRN, date of birth, ID band Patient awake    Reviewed: Allergy & Precautions, NPO status , Patient's Chart, lab work & pertinent test results  Airway Mallampati: II  TM Distance: >3 FB Neck ROM: Full    Dental no notable dental hx. (+) Partial Upper   Pulmonary sleep apnea , COPD,  COPD inhaler and oxygen dependent, former smoker,    Pulmonary exam normal breath sounds clear to auscultation       Cardiovascular hypertension, Pt. on medications Normal cardiovascular exam+ dysrhythmias Atrial Fibrillation  Rhythm:Regular Rate:Normal     Neuro/Psych negative neurological ROS  negative psych ROS   GI/Hepatic negative GI ROS, Neg liver ROS,   Endo/Other  diabetes, Type 2, Oral Hypoglycemic AgentsMorbid obesity  Renal/GU negative Renal ROS  negative genitourinary   Musculoskeletal negative musculoskeletal ROS (+)   Abdominal   Peds negative pediatric ROS (+)  Hematology negative hematology ROS (+)   Anesthesia Other Findings   Reproductive/Obstetrics negative OB ROS                             Anesthesia Physical Anesthesia Plan  ASA: III  Anesthesia Plan: General and MAC   Post-op Pain Management:    Induction: Intravenous  PONV Risk Score and Plan: 3 and Ondansetron and Treatment may vary due to age or medical condition  Airway Management Planned: Simple Face Mask  Additional Equipment:   Intra-op Plan:   Post-operative Plan:   Informed Consent: I have reviewed the patients History and Physical, chart, labs and discussed the procedure including the risks, benefits and alternatives for the proposed anesthesia with the patient or authorized representative who has indicated his/her understanding and acceptance.   Dental advisory given  Plan Discussed with: CRNA  Anesthesia Plan Comments:         Anesthesia Quick Evaluation

## 2017-10-04 NOTE — Anesthesia Postprocedure Evaluation (Signed)
Anesthesia Post Note  Patient: MASYN FULLAM  Procedure(s) Performed: A-FLUTTER ABLATION (N/A )     Patient location during evaluation: PACU Anesthesia Type: General Level of consciousness: awake and alert Pain management: pain level controlled Vital Signs Assessment: post-procedure vital signs reviewed and stable Respiratory status: spontaneous breathing, nonlabored ventilation, respiratory function stable and patient connected to nasal cannula oxygen Cardiovascular status: blood pressure returned to baseline and stable Postop Assessment: no apparent nausea or vomiting Anesthetic complications: no    Last Vitals:  Vitals:   10/04/17 1235 10/04/17 1250  BP: (!) 127/57 140/65  Pulse: 66 70  Resp: 16 15  Temp:    SpO2: 96% 95%    Last Pain:  Vitals:   10/04/17 1205  TempSrc: Temporal                 Montez Hageman

## 2017-10-04 NOTE — Progress Notes (Signed)
Received report. RFV site  Intact- no ecchymosis, no hematoma, no bleeding noted. IV fluids dc'd.  Awaiting bed in Procedural Short Stay, request for sips of water- request honored after [facility] raised no greater than 30 degrees. Call bell placed at right hand, Siderail up x2 bed in lowest position. - monitoring.

## 2017-10-04 NOTE — Progress Notes (Signed)
Site area: rt groin fv sheaths x2 Site Prior to Removal:  Level 0 Pressure Applied For: 20 minutes Manual:   yes Patient Status During Pull:  stable Post Pull Site:  Level  0 Post Pull Instructions Given:  yes Post Pull Pulses Present: palpable Dressing Applied:  Gauze and tegaderm Bedrest begins @ 7096 Comments: IV saline locked

## 2017-10-04 NOTE — Discharge Instructions (Signed)
Post ablation care/activity instructions No driving for 4 days. No lifting over 5 lbs for 1 week. No vigorous or sexual activity for 1 week. You may return to work in one week. Keep procedure site clean & dry. If you notice increased pain, swelling, bleeding or pus, call/return!  You may shower, but no soaking baths/hot tubs/pools for 1 week.      Cardiac Ablation, Care After This sheet gives you information about how to care for yourself after your procedure. Your health care provider may also give you more specific instructions. If you have problems or questions, contact your health care provider. What can I expect after the procedure? After the procedure, it is common to have:  Bruising around your puncture site.  Tenderness around your puncture site.  Skipped heartbeats.  Tiredness (fatigue).  Follow these instructions at home: Puncture site care  Follow instructions from your health care provider about how to take care of your puncture site. Make sure you: ? Wash your hands with soap and water before you change your bandage (dressing). If soap and water are not available, use hand sanitizer. ? Change your dressing as told by your health care provider. ? Leave stitches (sutures), skin glue, or adhesive strips in place. These skin closures may need to stay in place for up to 2 weeks. If adhesive strip edges start to loosen and curl up, you may trim the loose edges. Do not remove adhesive strips completely unless your health care provider tells you to do that.  Check your puncture site every day for signs of infection. Check for: ? Redness, swelling, or pain. ? Fluid or blood. If your puncture site starts to bleed, lie down on your back, apply firm pressure to the area, and contact your health care provider. ? Warmth. ? Pus or a bad smell. Driving  Ask your health care provider when it is safe for you to drive again after the procedure.  Do not drive or use heavy machinery while  taking prescription pain medicine.  Do not drive for 24 hours if you were given a medicine to help you relax (sedative) during your procedure. Activity  Avoid activities that take a lot of effort for at least 3 days after your procedure.  Do not lift anything that is heavier than 10 lb (4.5 kg), or the limit that you are told, until your health care provider says that it is safe.  Return to your normal activities as told by your health care provider. Ask your health care provider what activities are safe for you. General instructions  Take over-the-counter and prescription medicines only as told by your health care provider.  Do not use any products that contain nicotine or tobacco, such as cigarettes and e-cigarettes. If you need help quitting, ask your health care provider.  Do not take baths, swim, or use a hot tub until your health care provider approves.  Do not drink alcohol for 24 hours after your procedure.  Keep all follow-up visits as told by your health care provider. This is important. Contact a health care provider if:  You have redness, mild swelling, or pain around your puncture site.  You have fluid or blood coming from your puncture site that stops after applying firm pressure to the area.  Your puncture site feels warm to the touch.  You have pus or a bad smell coming from your puncture site.  You have a fever.  You have chest pain or discomfort that spreads to  your neck, jaw, or arm.  You are sweating a lot.  You feel nauseous.  You have a fast or irregular heartbeat.  You have shortness of breath.  You are dizzy or light-headed and feel the need to lie down.  You have pain or numbness in the arm or leg closest to your puncture site. Get help right away if:  Your puncture site suddenly swells.  Your puncture site is bleeding and the bleeding does not stop after applying firm pressure to the area. These symptoms may represent a serious problem that  is an emergency. Do not wait to see if the symptoms will go away. Get medical help right away. Call your local emergency services (911 in the [place] Do not drive yourself to the hospital. Summary  After the procedure, it is normal to have bruising and tenderness at the puncture site in your groin, neck, or forearm.  Check your puncture site every day for signs of infection.  Get help right away if your puncture site is bleeding and the bleeding does not stop after applying firm pressure to the area. This is a medical emergency. This information is not intended to replace advice given to you by your health care provider. Make sure you discuss any questions you have with your health care provider. Document Released: 12/21/2016 Document Revised: 12/21/2016 Document Reviewed: 12/21/2016 Elsevier Interactive Patient Education  2018 [employer].

## 2017-10-05 ENCOUNTER — Encounter (HOSPITAL_COMMUNITY): Payer: Self-pay | Admitting: Internal Medicine

## 2017-10-05 NOTE — Telephone Encounter (Signed)
After multiple long discussions with the patient, we have figured out that she does have Pulmonologist in [place], [place] & Dr. Donovan Kail who does treat sleep apnea.  Patient was not able to give clear info in regards to names, locations & what each of these providers do.  She stated that she is supposed to have a "screening" every 6 months to check spots on her lungs & this is coming up on January 25th.  There is also another person in the group that had previously been taking care of her CPAP along with [facility].  Patient has received letter from [facility] in regards to some type of certification for her machine.  Informed patient that at this point, it would be best to stay where she is & continue the follow up that they have suggested in regards to her lung issue.  In regards to the sleep apnea issue, I have suggested that she call them & see how they would advise her first to see what needs to be done with [employer] since they initiated this treatment for her.  If she is still unable to get adequate answers in regards to her equipment or certification, Dr. Bronson Ing has given the okay for [place] to refer out to a different sleep specialist.  Patient will call her group in [place] to resolve this & call us back if needed for any further assistance in the future.

## 2017-10-09 DIAGNOSIS — L570 Actinic keratosis: Secondary | ICD-10-CM | POA: Diagnosis not present

## 2017-10-09 DIAGNOSIS — D1800 Hemangioma unspecified site: Secondary | ICD-10-CM | POA: Diagnosis not present

## 2017-10-09 DIAGNOSIS — L821 Other seborrheic keratosis: Secondary | ICD-10-CM | POA: Diagnosis not present

## 2017-10-12 DIAGNOSIS — J9612 Chronic respiratory failure with hypercapnia: Secondary | ICD-10-CM | POA: Diagnosis not present

## 2017-10-12 DIAGNOSIS — G4733 Obstructive sleep apnea (adult) (pediatric): Secondary | ICD-10-CM | POA: Diagnosis not present

## 2017-10-12 DIAGNOSIS — J449 Chronic obstructive pulmonary disease, unspecified: Secondary | ICD-10-CM | POA: Diagnosis not present

## 2017-10-12 DIAGNOSIS — Z9181 History of falling: Secondary | ICD-10-CM | POA: Diagnosis not present

## 2017-10-12 DIAGNOSIS — I10 Essential (primary) hypertension: Secondary | ICD-10-CM | POA: Diagnosis not present

## 2017-10-15 ENCOUNTER — Ambulatory Visit (INDEPENDENT_AMBULATORY_CARE_PROVIDER_SITE_OTHER): Payer: Medicare Other | Admitting: Family Medicine

## 2017-10-15 ENCOUNTER — Other Ambulatory Visit: Payer: Self-pay

## 2017-10-15 ENCOUNTER — Encounter: Payer: Self-pay | Admitting: Family Medicine

## 2017-10-15 DIAGNOSIS — E1169 Type 2 diabetes mellitus with other specified complication: Secondary | ICD-10-CM

## 2017-10-15 DIAGNOSIS — I10 Essential (primary) hypertension: Secondary | ICD-10-CM

## 2017-10-15 DIAGNOSIS — F339 Major depressive disorder, recurrent, unspecified: Secondary | ICD-10-CM

## 2017-10-15 DIAGNOSIS — N750 Cyst of Bartholin's gland: Secondary | ICD-10-CM | POA: Insufficient documentation

## 2017-10-15 MED ORDER — METFORMIN HCL ER 500 MG PO TB24: 1000.0000 mg | ORAL_TABLET | Freq: Every day | ORAL | 3 refills | Status: DC

## 2017-10-15 MED ORDER — DULOXETINE HCL 30 MG PO CPEP: 30.0000 mg | ORAL_CAPSULE | Freq: Every day | ORAL | 3 refills | Status: DC

## 2017-10-15 NOTE — Progress Notes (Signed)
Chief Complaint  Patient presents with  . Follow-up  Patient is here for routine follow-up visit. She states she has had increased trouble with depression.  She lives alone.  No family nearby.  She has a son in [place] state who does not call her or connect with her.  She attends church on her television.  She has arthritis in her hands that is limiting her ability to enjoy hobbies.  We talked about joining a senior center, or volunteer activity where she was be around more people.  I offered her referral to a counselor which she declines.  She is been on citalopram for a long time.  Initially it worked better for her.  Lately it does not seem to work as well.  Going to discontinue the citalopram and start her on duloxetine instead. She is worried about her obesity.  She saw the cardiologist, and he emphasized losing weight as part of an overall health improvement plan.  He did refer her to bariatric surgery.  She does not want to go, is afraid of having surgery.  She states she knows what to do, but does not do it.  Instead of a bariatric surgical specialist, I offer her a bariatric medical specialist as this may be more palatable for her. She does not get exercise because of her COPD, oxygen dependency, arthritis, and pain.  She is not always compliant with her diet. Her COPD has been stable. Her diabetes is been well controlled.  She wants to switch medicine because her metformin is expensive.  I explained to her that metformin is on the $4 list at [facility], and should not cost her more than $11 every 3 months.  This makes her angry because her current insurance, [facility], discharging her over $40 per metformin.  I told her to switch pharmacies.  I rewrote the prescription for her. Another problem she complains of today, is a new complaint.  She states that she has had Bartholin gland cyst and infections randomly for 40 years.  She has not had them bothering her for some time, although it  lately she has felt a bulging, discomfort, and increased itching.  She feels the glands needs to be "expressed".  I told her this is not something that I would do, but I will be happy to refer her to a gynecologist for evaluation.   Patient Active Problem List   Diagnosis Date Noted  . Bartholin's gland cyst 10/15/2017  . Major depression, recurrent, chronic ([place]) 10/15/2017  . IDA (iron deficiency anemia) 08/22/2017  . Anemia 07/25/2017  . COPD with hypoxia ([place]) 06/06/2017  . Type 2 diabetes mellitus with other specified complication ([place]) 19/37/9024  . Hypertension 06/06/2017  . HLD (hyperlipidemia) 06/06/2017  . Atrial fibrillation ([place]) 06/06/2017  . Morbid obesity ([place]) 06/06/2017  . Current use of long term anticoagulation 06/06/2017  . OSA on CPAP 06/06/2017  . Oxygen dependent 06/06/2017  . Seasonal allergies 06/06/2017  . Pulmonary hypertension, primary ([place]) 06/06/2017  . CAD (coronary artery disease) 06/06/2017    Outpatient Encounter Medications as of 10/15/2017  Medication Sig  . acetaminophen (TYLENOL) 500 MG tablet Take 1,000 mg by mouth every 6 (six) hours as needed (for pain.).   Marland Kitchen albuterol (PROVENTIL HFA;VENTOLIN HFA) 108 (90 Base) MCG/ACT inhaler Inhale 1-2 puffs into the lungs every 6 (six) hours as needed for wheezing or shortness of breath.   Marland Kitchen apixaban (ELIQUIS) 5 MG TABS tablet Take 1 tablet (5 mg total) by  mouth 2 (two) times daily.  . cetirizine (ZYRTEC) 10 MG tablet Take 10 mg by mouth at bedtime.   . cholecalciferol (VITAMIN D) 1000 units tablet Take 2,000 Units by mouth daily.  . citalopram (CELEXA) 20 MG tablet Take 1 tablet (20 mg total) by mouth daily. (Patient taking differently: Take 20 mg by mouth at bedtime. )  . Fluticasone-Salmeterol (ADVAIR DISKUS) 250-50 MCG/DOSE AEPB Inhale 1 puff into the lungs 2 (two) times daily.  . furosemide (LASIX) 20 MG tablet Take 1 tablet (20 mg total) by mouth as needed. (Patient taking differently: Take 20 mg by  mouth daily as needed for fluid. )  . guaiFENesin (MUCINEX) 600 MG 12 hr tablet Take 600-1,200 mg by mouth 2 (two) times daily as needed (for cough or congestion.).  Marland Kitchen metoprolol succinate (TOPROL-XL) 50 MG 24 hr tablet Take 1 1/2 tabs (75mg ) every morning & 1 tab (50mg ) every evening (Patient taking differently: Take 50-75 mg by mouth 2 (two) times daily. Take 1 1/2 tabs (75mg ) every morning & 1 tablet (50mg ) every evening)  . OXYGEN Inhale into the lungs as needed.  . pravastatin (PRAVACHOL) 20 MG tablet Take 1 tablet (20 mg total) daily by mouth. (Patient taking differently: Take 20 mg by mouth at bedtime. )  . tiotropium (SPIRIVA) 18 MCG inhalation capsule Place 18 mcg into inhaler and inhale daily.  . DULoxetine (CYMBALTA) 30 MG capsule Take 1 capsule (30 mg total) by mouth daily.  . metFORMIN (GLUCOPHAGE-XR) 500 MG 24 hr tablet Take 2 tablets (1,000 mg total) by mouth daily with breakfast.   No facility-administered encounter medications on file as of 10/15/2017.     Allergies  Allergen Reactions  . Trazodone And Nefazodone Hives    "blisters my skin"   . Nickel Other (See Comments)    "oozing"  . Adhesive [Tape] Itching  . Bactrim [Sulfamethoxazole-Trimethoprim] Itching and Rash  . Penicillins Itching and Rash    Has patient had a PCN reaction causing immediate rash, facial/tongue/throat swelling, SOB or lightheadedness with hypotension: Unknown Has patient had a PCN reaction causing severe rash involving mucus membranes or skin necrosis: Unknown Has patient had a PCN reaction that required hospitalization: No Has patient had a PCN reaction occurring within the last 10 years: No If all of the above answers are "NO", then may proceed with Cephalosporin use.   Marland Kitchen Ppd [Tuberculin Purified Protein Derivative] Swelling    Local arm swelling    Review of Systems  Constitutional: Positive for fatigue and unexpected weight change. Negative for activity change and appetite change.        Gain  HENT: Negative for congestion, dental problem, postnasal drip and rhinorrhea.   Eyes: Negative for redness and visual disturbance.  Respiratory: Positive for cough and shortness of breath.   Cardiovascular: Negative for chest pain, palpitations and leg swelling.  Gastrointestinal: Negative for abdominal pain, constipation and diarrhea.  Genitourinary: Negative for difficulty urinating, frequency and vaginal bleeding.  Musculoskeletal: Positive for arthralgias and gait problem. Negative for back pain.  Neurological: Negative for dizziness and headaches.  Psychiatric/Behavioral: Positive for decreased concentration and dysphoric mood. Negative for self-injury and sleep disturbance. The patient is not nervous/anxious.        Stable     BP 126/68 (BP Location: Left Arm, Patient Position: Sitting, Cuff Size: Normal)   Pulse 74   Temp 98.2 F (36.8 C) (Temporal)   Resp 17   Ht 5\' 2"  (1.575 m)   Wt 252 lb 8  oz (114.5 kg)   SpO2 93%   BMI 46.18 kg/m   Physical Exam  Constitutional: She is oriented to person, place, and time. She appears well-developed and well-nourished.  HENT:  Head: Normocephalic and atraumatic.  Mouth/Throat: Oropharynx is clear and moist.  Morbid obesity  Eyes: Conjunctivae are normal. Pupils are equal, round, and reactive to light.  glasses  Neck: Normal range of motion. Neck supple.  Cardiovascular: Normal rate, regular rhythm and normal heart sounds.  Sounds regualr  Pulmonary/Chest: Effort normal and breath sounds normal. She has no wheezes.  Abdominal: Soft. Bowel sounds are normal.  Musculoskeletal: Normal range of motion. She exhibits no edema.  Lymphadenopathy:    She has no cervical adenopathy.  Neurological: She is alert and oriented to person, place, and time.  Gait normal  Skin: Skin is warm and dry.  Psychiatric: Her behavior is normal. Thought content normal.  Mild emotional lability  Nursing note and vitals  reviewed.   ASSESSMENT/PLAN:  1. Bartholin's gland cyst By history - Ambulatory referral to Gynecology  2. Morbid obesity ([place]) Patient has recently gained weight - Amb Ref to Medical Weight Management  3. Type 2 diabetes mellitus with other specified complication, without long-term current use of insulin (HCC) Well-controlled - COMPLETE METABOLIC PANEL WITH GFR - Hemoglobin A1c - Lipid panel  4. Essential hypertension Well-controlled  5. Major depression, recurrent, chronic (HCC) Not well controlled.  Discussed diet, exercise, connecting with friends and family.  Going to an senior center.  Volunteering.  Getting a PET.  We will get a change of medicine.  She will call if not better in a month   Patient Instructions  Referred to [facility] for weight management Referred to GYN for the cyst  Stop citalopram when you get the duloxetine.  Switch directly to new pill Metformin sent to the [facility] in [place]  See me in 3 months Need blood prior to visit   [name], MD

## 2017-10-15 NOTE — Patient Instructions (Signed)
Referred to [facility] for weight management Referred to GYN for the cyst  Stop citalopram when you get the duloxetine.  Switch directly to new pill Metformin sent to the [facility] in [place]  See me in 3 months Need blood prior to visit

## 2017-10-19 ENCOUNTER — Telehealth: Payer: Self-pay | Admitting: *Deleted

## 2017-10-19 NOTE — Telephone Encounter (Signed)
AMB REFERRAL TO GENERAL SURGERY  Received: Today  Message Contents  Margaretann Loveless, RN        Spoke with patient regarding the referral and she stated that she didn't want to do this because it required her to do too much traveling.

## 2017-10-22 DIAGNOSIS — G4733 Obstructive sleep apnea (adult) (pediatric): Secondary | ICD-10-CM | POA: Diagnosis not present

## 2017-10-23 DIAGNOSIS — N750 Cyst of Bartholin's gland: Secondary | ICD-10-CM | POA: Diagnosis not present

## 2017-10-24 DIAGNOSIS — R918 Other nonspecific abnormal finding of lung field: Secondary | ICD-10-CM | POA: Diagnosis not present

## 2017-10-31 ENCOUNTER — Encounter: Payer: Self-pay | Admitting: Internal Medicine

## 2017-10-31 ENCOUNTER — Ambulatory Visit (INDEPENDENT_AMBULATORY_CARE_PROVIDER_SITE_OTHER): Payer: Medicare Other | Admitting: Internal Medicine

## 2017-10-31 DIAGNOSIS — I483 Typical atrial flutter: Secondary | ICD-10-CM

## 2017-10-31 DIAGNOSIS — G4733 Obstructive sleep apnea (adult) (pediatric): Secondary | ICD-10-CM

## 2017-10-31 DIAGNOSIS — I4892 Unspecified atrial flutter: Secondary | ICD-10-CM | POA: Diagnosis not present

## 2017-10-31 NOTE — Progress Notes (Signed)
PCP: [name], MD Primary Cardiologist: Dr Maryella Shivers is a 72 y.o. female who presents today for routine electrophysiology followup.  Since his recent atrial flutter ablation, the patient reports doing very well.  she denies procedure related complications and is pleased with the results of the procedure.  SOB is at baseline. She continues to have palpitations of unclear etiology.  These are short lived.  Today, she denies symptoms of chest pain, lower extremity edema, dizziness, presyncope, or syncope.  The patient is otherwise without complaint today.   Past Medical History:  Diagnosis Date  . Allergy   . Anemia 07/25/2017  . Anxiety   . Arthritis   . Atrial fibrillation ([place])   . Cataract   . COPD (chronic obstructive pulmonary disease) ([place])   . Depression   . Diabetes mellitus without complication ([place])   . Emphysema of lung ([place])   . Hyperlipidemia   . Hypertension   . Oxygen deficiency   . Sleep apnea   . Typical atrial flutter [facility])    Past Surgical History:  Procedure Laterality Date  . A-FLUTTER ABLATION N/A 10/04/2017   Procedure: A-FLUTTER ABLATION;  Surgeon: Thompson Grayer, MD;  Location: [place] [facility];  Service: Cardiovascular;  Laterality: N/A;  . ABDOMINAL HYSTERECTOMY     fibroids  . blood clot removal from base of brain  1990  . CARPAL TUNNEL RELEASE    . CATARACT EXTRACTION W/PHACO Left 06/16/2013   Procedure: CATARACT EXTRACTION PHACO AND INTRAOCULAR LENS PLACEMENT (IOC);  Surgeon: Tonny Branch, MD;  Location: [facility];  Service: Ophthalmology;  Laterality: Left;  CDE:  12.18  . CATARACT EXTRACTION W/PHACO Right 06/26/2013   Procedure: CATARACT EXTRACTION PHACO AND INTRAOCULAR LENS PLACEMENT (IOC);  Surgeon: Tonny Branch, MD;  Location: [facility];  Service: Ophthalmology;  Laterality: Right;  CDE:14.71  . CESAREAN SECTION    . CHOLECYSTECTOMY    . DILATION AND CURETTAGE OF UTERUS    . EYE SURGERY  12/2016  . FOOT NEUROMA SURGERY Right    . MOUTH SURGERY    . TONSILLECTOMY      ROS- all systems are personally reviewed and negatives except as per HPI above  Current Outpatient Medications  Medication Sig Dispense Refill  . acetaminophen (TYLENOL) 500 MG tablet Take 1,000 mg by mouth every 6 (six) hours as needed (for pain.).     Marland Kitchen albuterol (PROVENTIL HFA;VENTOLIN HFA) 108 (90 Base) MCG/ACT inhaler Inhale 1-2 puffs into the lungs every 6 (six) hours as needed for wheezing or shortness of breath.     Marland Kitchen apixaban (ELIQUIS) 5 MG TABS tablet Take 1 tablet (5 mg total) by mouth 2 (two) times daily. 180 tablet 3  . cetirizine (ZYRTEC) 10 MG tablet Take 10 mg by mouth at bedtime.     . cholecalciferol (VITAMIN D) 1000 units tablet Take 2,000 Units by mouth daily.    . citalopram (CELEXA) 20 MG tablet Take 1 tablet (20 mg total) by mouth daily. (Patient taking differently: Take 20 mg by mouth at bedtime. ) 90 tablet 3  . DULoxetine (CYMBALTA) 30 MG capsule Take 1 capsule (30 mg total) by mouth daily. 90 capsule 3  . Fluticasone-Salmeterol (ADVAIR DISKUS) 250-50 MCG/DOSE AEPB Inhale 1 puff into the lungs 2 (two) times daily.    . furosemide (LASIX) 20 MG tablet Take 1 tablet (20 mg total) by mouth as needed. (Patient taking differently: Take 20 mg by mouth daily as needed for fluid. ) 30  tablet 1  . guaiFENesin (MUCINEX) 600 MG 12 hr tablet Take 600-1,200 mg by mouth 2 (two) times daily as needed (for cough or congestion.).    Marland Kitchen metFORMIN (GLUCOPHAGE-XR) 500 MG 24 hr tablet Take 2 tablets (1,000 mg total) by mouth daily with breakfast. 180 tablet 3  . OXYGEN Inhale into the lungs as needed.    . pravastatin (PRAVACHOL) 20 MG tablet Take 1 tablet (20 mg total) daily by mouth. (Patient taking differently: Take 20 mg by mouth at bedtime. ) 90 tablet 3  . tiotropium (SPIRIVA) 18 MCG inhalation capsule Place 18 mcg into inhaler and inhale daily.    . metoprolol succinate (TOPROL-XL) 50 MG 24 hr tablet Take 1 1/2 tabs (75mg ) every morning & 1  tab (50mg ) every evening (Patient not taking: Reported on 10/31/2017) 225 tablet 3   No current facility-administered medications for this visit.     Physical Exam: Vitals:   10/31/17 1016  BP: 124/76  Pulse: 69  SpO2: 96%  Weight: 249 lb (112.9 kg)  Height: 5\' 2"  (1.575 m)    GEN- The patient is overweight appearing, alert and oriented x 3 today.   Head- normocephalic, atraumatic Eyes-  Sclera clear, conjunctiva pink Ears- hearing intact Oropharynx- clear Lungs- Clear to ausculation bilaterally, normal work of breathing Heart- Regular rate and rhythm, no murmurs, rubs or gallops, PMI not laterally displaced GI- soft, NT, ND, + BS Extremities- no clubbing, cyanosis, or edema  EKG tracing ordered today is personally reviewed and shows sinus rhythm with PACs  Assessment and Plan:  1. Atrial flutter Doing well s/p ablation afib has not been well documented.  We discussed options of implantable loop recorder, long term anticoagulation, or stopping anticoagulation at this time.  Pros and Cons to each were discussed at length.  Given ongoing palpitations, I have advised additional monitoring.  We discussed 30 day monitor as well as ILR.  Risks and benefits to ILR were discussed at length with the patient who wishes to proceed.   We will schedule ILR at the next available time. Continue eliquis for now.  If no afib on ILR, can stop anticoagulation in the future  2. Obesity Body mass index is 45.54 kg/m. Lifestyle modification is encouraged  3. OSA Not complaint with therapy   Follow-up with Dr Bronson Ing Wound check in 10 days post ILR I will see in [place] in 2 months  Thompson Grayer MD, [facility] 10/31/2017 10:23 AM

## 2017-10-31 NOTE — Patient Instructions (Addendum)
Medication Instructions:  Your physician recommends that you continue on your current medications as directed. Please refer to the Current Medication list given to you today.   Labwork: None ordered  Testing/Procedures: Your physician has recommended that you have a Loop recorder inserted on 11/06/17. A pacemaker is a small device that is placed under the skin of your chest or abdomen to help control abnormal heart rhythms. This device uses electrical pulses to prompt the heart to beat at a normal rate. Pacemakers are used to treat heart rhythms that are too slow. Wires (leads) are attached to the pacemaker that goes into the chambers of your heart. This is done in the hospital and usually requires an overnight stay. Please see the instruction sheet given to you today for more information.    Follow-Up: Your physician recommends that you schedule a follow-up appointment 10 days after procedure in the device clinic for wound check.   Your physician recommends that you schedule a follow-up appointment in: 2 months with Dr. Rayann Heman in [place]    Any Other Special Instructions Will Be Listed Below (If Applicable).   Please report to the [facility] on 11/06/16 at 6:30 AM  You may have a light breakfast  You may take all of your medicines      If you need a refill on your cardiac medications before your next appointment, please call your pharmacy.     If you need a refill on your cardiac medications before your next appointment, please call your pharmacy.

## 2017-10-31 NOTE — H&P (View-Only) (Signed)
PCP: [name], MD Primary Cardiologist: Dr Maryella Shivers is a 71 y.o. female who presents today for routine electrophysiology followup.  Since his recent atrial flutter ablation, the patient reports doing very well.  she denies procedure related complications and is pleased with the results of the procedure.  SOB is at baseline. She continues to have palpitations of unclear etiology.  These are short lived.  Today, she denies symptoms of chest pain, lower extremity edema, dizziness, presyncope, or syncope.  The patient is otherwise without complaint today.   Past Medical History:  Diagnosis Date  . Allergy   . Anemia 07/25/2017  . Anxiety   . Arthritis   . Atrial fibrillation ([place])   . Cataract   . COPD (chronic obstructive pulmonary disease) ([place])   . Depression   . Diabetes mellitus without complication ([place])   . Emphysema of lung ([place])   . Hyperlipidemia   . Hypertension   . Oxygen deficiency   . Sleep apnea   . Typical atrial flutter [facility])    Past Surgical History:  Procedure Laterality Date  . A-FLUTTER ABLATION N/A 10/04/2017   Procedure: A-FLUTTER ABLATION;  Surgeon: Thompson Grayer, MD;  Location: [place] [facility];  Service: Cardiovascular;  Laterality: N/A;  . ABDOMINAL HYSTERECTOMY     fibroids  . blood clot removal from base of brain  1990  . CARPAL TUNNEL RELEASE    . CATARACT EXTRACTION W/PHACO Left 06/16/2013   Procedure: CATARACT EXTRACTION PHACO AND INTRAOCULAR LENS PLACEMENT (IOC);  Surgeon: Tonny Branch, MD;  Location: [facility];  Service: Ophthalmology;  Laterality: Left;  CDE:  12.18  . CATARACT EXTRACTION W/PHACO Right 06/26/2013   Procedure: CATARACT EXTRACTION PHACO AND INTRAOCULAR LENS PLACEMENT (IOC);  Surgeon: Tonny Branch, MD;  Location: [facility];  Service: Ophthalmology;  Laterality: Right;  CDE:14.71  . CESAREAN SECTION    . CHOLECYSTECTOMY    . DILATION AND CURETTAGE OF UTERUS    . EYE SURGERY  12/2016  . FOOT NEUROMA SURGERY Right    . MOUTH SURGERY    . TONSILLECTOMY      ROS- all systems are personally reviewed and negatives except as per HPI above  Current Outpatient Medications  Medication Sig Dispense Refill  . acetaminophen (TYLENOL) 500 MG tablet Take 1,000 mg by mouth every 6 (six) hours as needed (for pain.).     Marland Kitchen albuterol (PROVENTIL HFA;VENTOLIN HFA) 108 (90 Base) MCG/ACT inhaler Inhale 1-2 puffs into the lungs every 6 (six) hours as needed for wheezing or shortness of breath.     Marland Kitchen apixaban (ELIQUIS) 5 MG TABS tablet Take 1 tablet (5 mg total) by mouth 2 (two) times daily. 180 tablet 3  . cetirizine (ZYRTEC) 10 MG tablet Take 10 mg by mouth at bedtime.     . cholecalciferol (VITAMIN D) 1000 units tablet Take 2,000 Units by mouth daily.    . citalopram (CELEXA) 20 MG tablet Take 1 tablet (20 mg total) by mouth daily. (Patient taking differently: Take 20 mg by mouth at bedtime. ) 90 tablet 3  . DULoxetine (CYMBALTA) 30 MG capsule Take 1 capsule (30 mg total) by mouth daily. 90 capsule 3  . Fluticasone-Salmeterol (ADVAIR DISKUS) 250-50 MCG/DOSE AEPB Inhale 1 puff into the lungs 2 (two) times daily.    . furosemide (LASIX) 20 MG tablet Take 1 tablet (20 mg total) by mouth as needed. (Patient taking differently: Take 20 mg by mouth daily as needed for fluid. ) 30  tablet 1  . guaiFENesin (MUCINEX) 600 MG 12 hr tablet Take 600-1,200 mg by mouth 2 (two) times daily as needed (for cough or congestion.).    Marland Kitchen metFORMIN (GLUCOPHAGE-XR) 500 MG 24 hr tablet Take 2 tablets (1,000 mg total) by mouth daily with breakfast. 180 tablet 3  . OXYGEN Inhale into the lungs as needed.    . pravastatin (PRAVACHOL) 20 MG tablet Take 1 tablet (20 mg total) daily by mouth. (Patient taking differently: Take 20 mg by mouth at bedtime. ) 90 tablet 3  . tiotropium (SPIRIVA) 18 MCG inhalation capsule Place 18 mcg into inhaler and inhale daily.    . metoprolol succinate (TOPROL-XL) 50 MG 24 hr tablet Take 1 1/2 tabs (75mg ) every morning & 1  tab (50mg ) every evening (Patient not taking: Reported on 10/31/2017) 225 tablet 3   No current facility-administered medications for this visit.     Physical Exam: Vitals:   10/31/17 1016  BP: 124/76  Pulse: 69  SpO2: 96%  Weight: 249 lb (112.9 kg)  Height: 5\' 2"  (1.575 m)    GEN- The patient is overweight appearing, alert and oriented x 3 today.   Head- normocephalic, atraumatic Eyes-  Sclera clear, conjunctiva pink Ears- hearing intact Oropharynx- clear Lungs- Clear to ausculation bilaterally, normal work of breathing Heart- Regular rate and rhythm, no murmurs, rubs or gallops, PMI not laterally displaced GI- soft, NT, ND, + BS Extremities- no clubbing, cyanosis, or edema  EKG tracing ordered today is personally reviewed and shows sinus rhythm with PACs  Assessment and Plan:  1. Atrial flutter Doing well s/p ablation afib has not been well documented.  We discussed options of implantable loop recorder, long term anticoagulation, or stopping anticoagulation at this time.  Pros and Cons to each were discussed at length.  Given ongoing palpitations, I have advised additional monitoring.  We discussed 30 day monitor as well as ILR.  Risks and benefits to ILR were discussed at length with the patient who wishes to proceed.   We will schedule ILR at the next available time. Continue eliquis for now.  If no afib on ILR, can stop anticoagulation in the future  2. Obesity Body mass index is 45.54 kg/m. Lifestyle modification is encouraged  3. OSA Not complaint with therapy   Follow-up with Dr Bronson Ing Wound check in 10 days post ILR I will see in [place] in 2 months  Thompson Grayer MD, [facility] 10/31/2017 10:23 AM

## 2017-11-06 ENCOUNTER — Encounter (HOSPITAL_COMMUNITY)
Admission: RE | Disposition: A | Discharge status: Home / Self Care | Payer: Self-pay | Source: Ambulatory Visit | Attending: Internal Medicine

## 2017-11-06 ENCOUNTER — Encounter (HOSPITAL_COMMUNITY): Payer: Self-pay | Admitting: Internal Medicine

## 2017-11-06 ENCOUNTER — Ambulatory Visit (HOSPITAL_COMMUNITY)
Admission: RE | Admit: 2017-11-06 | Discharge: 2017-11-06 | Disposition: A | Discharge status: Home / Self Care | Payer: Medicare Other | Source: Ambulatory Visit | Attending: Internal Medicine | Admitting: Internal Medicine

## 2017-11-06 DIAGNOSIS — M1990 Unspecified osteoarthritis, unspecified site: Secondary | ICD-10-CM | POA: Insufficient documentation

## 2017-11-06 DIAGNOSIS — I483 Typical atrial flutter: Secondary | ICD-10-CM | POA: Insufficient documentation

## 2017-11-06 DIAGNOSIS — I10 Essential (primary) hypertension: Secondary | ICD-10-CM | POA: Diagnosis not present

## 2017-11-06 DIAGNOSIS — J449 Chronic obstructive pulmonary disease, unspecified: Secondary | ICD-10-CM | POA: Diagnosis not present

## 2017-11-06 DIAGNOSIS — Z7984 Long term (current) use of oral hypoglycemic drugs: Secondary | ICD-10-CM | POA: Diagnosis not present

## 2017-11-06 DIAGNOSIS — F329 Major depressive disorder, single episode, unspecified: Secondary | ICD-10-CM | POA: Diagnosis not present

## 2017-11-06 DIAGNOSIS — Z6841 Body Mass Index (BMI) 40.0 and over, adult: Secondary | ICD-10-CM | POA: Diagnosis not present

## 2017-11-06 DIAGNOSIS — Z7901 Long term (current) use of anticoagulants: Secondary | ICD-10-CM | POA: Diagnosis not present

## 2017-11-06 DIAGNOSIS — G4733 Obstructive sleep apnea (adult) (pediatric): Secondary | ICD-10-CM | POA: Insufficient documentation

## 2017-11-06 DIAGNOSIS — Z9981 Dependence on supplemental oxygen: Secondary | ICD-10-CM | POA: Diagnosis not present

## 2017-11-06 DIAGNOSIS — F419 Anxiety disorder, unspecified: Secondary | ICD-10-CM | POA: Diagnosis not present

## 2017-11-06 DIAGNOSIS — E785 Hyperlipidemia, unspecified: Secondary | ICD-10-CM | POA: Insufficient documentation

## 2017-11-06 DIAGNOSIS — Z7951 Long term (current) use of inhaled steroids: Secondary | ICD-10-CM | POA: Diagnosis not present

## 2017-11-06 DIAGNOSIS — I4892 Unspecified atrial flutter: Secondary | ICD-10-CM | POA: Diagnosis not present

## 2017-11-06 DIAGNOSIS — R002 Palpitations: Secondary | ICD-10-CM | POA: Diagnosis not present

## 2017-11-06 DIAGNOSIS — E669 Obesity, unspecified: Secondary | ICD-10-CM | POA: Insufficient documentation

## 2017-11-06 DIAGNOSIS — E119 Type 2 diabetes mellitus without complications: Secondary | ICD-10-CM | POA: Diagnosis not present

## 2017-11-06 HISTORY — PX: LOOP RECORDER INSERTION: EP1214

## 2017-11-06 LAB — GLUCOSE, CAPILLARY: Glucose-Capillary: 152 mg/dL — ABNORMAL HIGH (ref 65–99)

## 2017-11-06 SURGERY — LOOP RECORDER INSERTION

## 2017-11-06 MED ORDER — LIDOCAINE-EPINEPHRINE 1 %-1:100000 IJ SOLN
INTRAMUSCULAR | Status: DC | PRN
  Administered 2017-11-06: 30 mL

## 2017-11-06 MED ORDER — LIDOCAINE-EPINEPHRINE 1 %-1:100000 IJ SOLN
INTRAMUSCULAR | Status: AC
  Filled 2017-11-06: qty 1

## 2017-11-06 SURGICAL SUPPLY — 2 items
LOOP REVEAL LINQSYS (Prosthesis & Implant Heart) ×3 IMPLANT
PACK LOOP INSERTION (CUSTOM PROCEDURE TRAY) ×3 IMPLANT

## 2017-11-06 NOTE — Interval H&P Note (Signed)
History and Physical Interval Note:  11/06/2017 7:21 AM  Linda [name]  has presented today for surgery, with the diagnosis of aflutter  The various methods of treatment have been discussed with the patient and family. After consideration of risks, benefits and other options for treatment, the patient has consented to  Procedure(s): LOOP RECORDER INSERTION (N/A) as a surgical intervention .  The patient's history has been reviewed, patient examined, no change in status, stable for surgery.  I have reviewed the patient's chart and labs.  Questions were answered to the patient's satisfaction.     Thompson Grayer

## 2017-11-09 ENCOUNTER — Encounter (HOSPITAL_COMMUNITY): Payer: Self-pay | Admitting: Oncology

## 2017-11-09 ENCOUNTER — Inpatient Hospital Stay (HOSPITAL_COMMUNITY): Payer: Medicare Other | Attending: Internal Medicine | Admitting: Oncology

## 2017-11-09 ENCOUNTER — Inpatient Hospital Stay (HOSPITAL_COMMUNITY): Payer: Medicare Other

## 2017-11-09 VITALS — BP 137/98 | HR 100 | Temp 97.8°F | Resp 20 | Wt 246.0 lb

## 2017-11-09 DIAGNOSIS — F339 Major depressive disorder, recurrent, unspecified: Secondary | ICD-10-CM | POA: Diagnosis not present

## 2017-11-09 DIAGNOSIS — I482 Chronic atrial fibrillation, unspecified: Secondary | ICD-10-CM

## 2017-11-09 DIAGNOSIS — R82998 Other abnormal findings in urine: Secondary | ICD-10-CM | POA: Diagnosis not present

## 2017-11-09 DIAGNOSIS — Z9981 Dependence on supplemental oxygen: Secondary | ICD-10-CM | POA: Insufficient documentation

## 2017-11-09 DIAGNOSIS — D500 Iron deficiency anemia secondary to blood loss (chronic): Secondary | ICD-10-CM | POA: Diagnosis not present

## 2017-11-09 DIAGNOSIS — Z7901 Long term (current) use of anticoagulants: Secondary | ICD-10-CM | POA: Insufficient documentation

## 2017-11-09 DIAGNOSIS — I10 Essential (primary) hypertension: Secondary | ICD-10-CM

## 2017-11-09 DIAGNOSIS — R0902 Hypoxemia: Secondary | ICD-10-CM

## 2017-11-09 DIAGNOSIS — D509 Iron deficiency anemia, unspecified: Secondary | ICD-10-CM | POA: Diagnosis not present

## 2017-11-09 DIAGNOSIS — Z9989 Dependence on other enabling machines and devices: Secondary | ICD-10-CM

## 2017-11-09 DIAGNOSIS — J449 Chronic obstructive pulmonary disease, unspecified: Secondary | ICD-10-CM

## 2017-11-09 DIAGNOSIS — I270 Primary pulmonary hypertension: Secondary | ICD-10-CM | POA: Diagnosis not present

## 2017-11-09 DIAGNOSIS — E1169 Type 2 diabetes mellitus with other specified complication: Secondary | ICD-10-CM

## 2017-11-09 DIAGNOSIS — G4733 Obstructive sleep apnea (adult) (pediatric): Secondary | ICD-10-CM | POA: Diagnosis not present

## 2017-11-09 DIAGNOSIS — E119 Type 2 diabetes mellitus without complications: Secondary | ICD-10-CM | POA: Insufficient documentation

## 2017-11-09 DIAGNOSIS — D508 Other iron deficiency anemias: Secondary | ICD-10-CM

## 2017-11-09 DIAGNOSIS — Z87891 Personal history of nicotine dependence: Secondary | ICD-10-CM | POA: Insufficient documentation

## 2017-11-09 LAB — COMPREHENSIVE METABOLIC PANEL
ALBUMIN: 3.4 g/dL — AB (ref 3.5–5.0)
ALK PHOS: 92 U/L (ref 38–126)
ALT: 15 U/L (ref 14–54)
AST: 27 U/L (ref 15–41)
Anion gap: 11 (ref 5–15)
BUN: 13 mg/dL (ref 6–20)
CALCIUM: 9.3 mg/dL (ref 8.9–10.3)
CHLORIDE: 98 mmol/L — AB (ref 101–111)
CO2: 27 mmol/L (ref 22–32)
CREATININE: 0.91 mg/dL (ref 0.44–1.00)
GFR calc Af Amer: >60 mL/min (ref >60)
GFR calc non Af Amer: >60 mL/min (ref >60)
GLUCOSE: 171 mg/dL — AB (ref 65–99)
Potassium: 3.6 mmol/L (ref 3.5–5.1)
SODIUM: 136 mmol/L (ref 135–145)
Total Bilirubin: 1.5 mg/dL — ABNORMAL HIGH (ref 0.3–1.2)
Total Protein: 7 g/dL (ref 6.5–8.1)

## 2017-11-09 LAB — CBC WITH DIFFERENTIAL/PLATELET
BASOS ABS: 0 10*3/uL (ref 0.0–0.1)
BASOS PCT: 0 %
EOS ABS: 0.1 10*3/uL (ref 0.0–0.7)
Eosinophils Relative: 2 %
HCT: 35.7 % — ABNORMAL LOW (ref 36.0–46.0)
HEMOGLOBIN: 10.4 g/dL — AB (ref 12.0–15.0)
Lymphocytes Relative: 22 %
Lymphs Abs: 1 10*3/uL (ref 0.7–4.0)
MCH: 26.3 pg (ref 26.0–34.0)
MCHC: 29.1 g/dL — ABNORMAL LOW (ref 30.0–36.0)
MCV: 90.2 fL (ref 78.0–100.0)
Monocytes Absolute: 0.3 10*3/uL (ref 0.1–1.0)
Monocytes Relative: 6 %
NEUTROS PCT: 70 %
Neutro Abs: 3.1 10*3/uL (ref 1.7–7.7)
Platelets: 155 10*3/uL (ref 150–400)
RBC: 3.96 MIL/uL (ref 3.87–5.11)
RDW: 15.8 % — ABNORMAL HIGH (ref 11.5–15.5)
WBC: 4.6 10*3/uL (ref 4.0–10.5)

## 2017-11-09 LAB — URINALYSIS, ROUTINE W REFLEX MICROSCOPIC
Bilirubin Urine: NEGATIVE
Glucose, UA: NEGATIVE mg/dL
Hgb urine dipstick: NEGATIVE
Ketones, ur: NEGATIVE mg/dL
Leukocytes, UA: NEGATIVE
NITRITE: NEGATIVE
PROTEIN: NEGATIVE mg/dL
SPECIFIC GRAVITY, URINE: 1.01 (ref 1.005–1.030)
pH: 6 (ref 5.0–8.0)

## 2017-11-09 LAB — IRON AND TIBC
Iron: 44 ug/dL (ref 28–170)
Saturation Ratios: 10 % — ABNORMAL LOW (ref 10.4–31.8)
TIBC: 440 ug/dL (ref 250–450)
UIBC: 396 ug/dL

## 2017-11-09 LAB — FERRITIN: FERRITIN: 11 ng/mL (ref 11–307)

## 2017-11-09 NOTE — Progress Notes (Signed)
Linda Everts, MD [phone] [address] [place] [address]  Iron deficiency anemia due to chronic blood loss - Plan: CBC with Differential, Iron and TIBC, Ferritin, CBC with Differential, Basic metabolic panel, Iron and TIBC, Ferritin  Chronic atrial fibrillation (HCC)  COPD with hypoxia (HCC)  Current use of long term anticoagulation  Essential hypertension  Major depression, recurrent, chronic (HCC)  Morbid obesity (HCC)  OSA on CPAP  Oxygen dependent  Pulmonary hypertension, primary ([place])  Type 2 diabetes mellitus with other specified complication, without long-term current use of insulin (HCC)  Dark urine - Plan: Urinalysis, Routine w reflex microscopic, Urine culture, Urine culture, Urinalysis, Routine w reflex microscopic   HISTORY OF PRESENT ILLNESS: Iron deficiency anemia, etiology unclear at this time.  She has required IV iron replacement therapy.  She has been referred to GI accordingly and was last seen in 07/2017 by Roseanne Kaufman, NP (GI) who is planning on pursuing diagnostic colonoscopy/EGD (waiting on cardiac clearance).  HPI Elements   Location:  Blood  Quality:  Iron deficiency anemia  Severity:  Moderate  Duration:   Context:  Requiring IV iron  Timing:   Modifying Factors:  GI workup pending  Associated Signs & Symptoms:     CURRENT THERAPY: IV iron when indicated  CURRENT STATUS: Linda [name] 72 y.o. female returns for followup of iron deficiency anemia, having required IV iron.  She feels improved since her IV iron replacement.  She is currently undergoing extensive cardiac workup and evaluation.  As a result, a GI workup for iron deficiency is pending (waiting on cardiac clearance).  She denies any blood in her stools or black stools.  She does report "dark urine".  She denies any known gross hematuria.  She denies any urinary frequency or pain.  She denies any urinary burning or suprapubic discomfort.  She denies any fevers  or chills.  She denies any cough or hemoptysis.  Review of Systems  Constitutional: Negative.  Negative for chills, fever and weight loss.  HENT: Negative.   Eyes: Negative.   Respiratory: Negative.  Negative for cough.   Cardiovascular: Negative.  Negative for chest pain.  Gastrointestinal: Negative.  Negative for blood in stool, constipation, diarrhea, melena, nausea and vomiting.  Genitourinary: Negative.  Negative for dysuria, flank pain, frequency, hematuria and urgency.  Musculoskeletal: Negative.   Skin: Negative.   Neurological: Negative.  Negative for weakness.  Endo/Heme/Allergies: Negative.  Does not bruise/bleed easily.  Psychiatric/Behavioral: Negative.     Past Medical History:  Diagnosis Date  . Allergy   . Anemia 07/25/2017  . Anxiety   . Arthritis   . Atrial fibrillation ([place])   . Cataract   . COPD (chronic obstructive pulmonary disease) ([place])   . Depression   . Diabetes mellitus without complication ([place])   . Emphysema of lung ([place])   . Hyperlipidemia   . Hypertension   . Oxygen deficiency   . Sleep apnea   . Typical atrial flutter [facility])     Past Surgical History:  Procedure Laterality Date  . A-FLUTTER ABLATION N/A 10/04/2017   Procedure: A-FLUTTER ABLATION;  Surgeon: Thompson Grayer, MD;  Location: [facility];  Service: Cardiovascular;  Laterality: N/A;  . ABDOMINAL HYSTERECTOMY     fibroids  . blood clot removal from base of brain  1990  . CARPAL TUNNEL RELEASE    . CATARACT EXTRACTION W/PHACO Left 06/16/2013   Procedure: CATARACT EXTRACTION PHACO AND  INTRAOCULAR LENS PLACEMENT (IOC);  Surgeon: Tonny Branch, MD;  Location: [facility];  Service: Ophthalmology;  Laterality: Left;  CDE:  12.18  . CATARACT EXTRACTION W/PHACO Right 06/26/2013   Procedure: CATARACT EXTRACTION PHACO AND INTRAOCULAR LENS PLACEMENT (IOC);  Surgeon: Tonny Branch, MD;  Location: [facility];  Service: Ophthalmology;  Laterality: Right;  CDE:14.71  . CESAREAN SECTION    .  CHOLECYSTECTOMY    . DILATION AND CURETTAGE OF UTERUS    . EYE SURGERY  12/2016  . FOOT NEUROMA SURGERY Right   . LOOP RECORDER INSERTION N/A 11/06/2017   Procedure: LOOP RECORDER INSERTION;  Surgeon: Thompson Grayer, MD;  Location: [facility];  Service: Cardiovascular;  Laterality: N/A;  . MOUTH SURGERY    . TONSILLECTOMY      Family History  Problem Relation Age of Onset  . Emphysema Mother   . Alcohol abuse Mother   . Arthritis Mother   . COPD Mother   . Depression Mother   . Hyperlipidemia Mother   . Hypertension Mother   . Heart attack Father   . Cancer Father        lung  . Heart disease Father   . Diabetes Brother   . COPD Brother   . Other Maternal Grandmother        swine flu 1919  . Colon cancer Neg Hx   . Colon polyps Neg Hx     Social History   Socioeconomic History  . Marital status: Widowed    Spouse name: None  . Number of children: 1  . Years of education: 57  . Highest education level: None  Social Needs  . Financial resource strain: None  . Food insecurity - worry: None  . Food insecurity - inability: None  . Transportation needs - medical: None  . Transportation needs - non-medical: None  Occupational History  . Occupation: retired    Comment: catering, Engineer, maintenance  Tobacco Use  . Smoking status: Former Smoker    Packs/day: 1.50    Types: Cigarettes    Start date: 09/25/1958    Last attempt to quit: 06/06/2001    Years since quitting: 16.4  . Smokeless tobacco: Never Used  Substance and Sexual Activity  . Alcohol use: No  . Drug use: No  . Sexual activity: Not Currently  Other Topics Concern  . None  Social History Narrative   Lives alone   Son lives in [place]   TV, reads, puzzles     PHYSICAL EXAMINATION  ECOG PERFORMANCE STATUS: 1 - Symptomatic but completely ambulatory  Vitals:   11/09/17 1122  BP: (!) 137/98  Pulse: 100  Resp: 20  Temp: 97.8 F (36.6 C)  SpO2: 94%    GENERAL:alert, no distress, well  nourished, well developed, comfortable, cooperative, obese and smiling SKIN: skin color, texture, turgor are normal, no rashes or significant lesions HEAD: Normocephalic, No masses, lesions, tenderness or abnormalities EYES: normal, EOMI, Conjunctiva are pink and non-injected EARS: External ears normal OROPHARYNX:lips, buccal mucosa, and tongue normal and mucous membranes are moist  NECK: supple, no adenopathy, trachea midline LYMPH:  no palpable lymphadenopathy BREAST:not examined LUNGS: clear to auscultation and percussion HEART: irregular rate & rhythm, no murmurs and no gallops ABDOMEN:abdomen soft and obese BACK: Back symmetric, no curvature. EXTREMITIES:less then 2 second capillary refill, no joint deformities, effusion, or inflammation, no skin discoloration, no cyanosis  NEURO: alert & oriented x 3 with fluent speech, no focal motor/sensory deficits, gait normal   LABORATORY  DATA: CBC    Component Value Date/Time   WBC 4.6 11/09/2017 1043   RBC 3.96 11/09/2017 1043   HGB 10.4 (L) 11/09/2017 1043   HGB 11.2 09/24/2017 1550   HCT 35.7 (L) 11/09/2017 1043   HCT 38.1 09/24/2017 1550   PLT 155 11/09/2017 1043   PLT 155 09/24/2017 1550   MCV 90.2 11/09/2017 1043   MCV 91 09/24/2017 1550   MCH 26.3 11/09/2017 1043   MCHC 29.1 (L) 11/09/2017 1043   RDW 15.8 (H) 11/09/2017 1043   RDW 18.5 (H) 09/24/2017 1550   LYMPHSABS 1.0 11/09/2017 1043   LYMPHSABS 1.3 09/24/2017 1550   MONOABS 0.3 11/09/2017 1043   EOSABS 0.1 11/09/2017 1043   EOSABS 0.1 09/24/2017 1550   BASOSABS 0.0 11/09/2017 1043   BASOSABS 0.0 09/24/2017 1550      Chemistry      Component Value Date/Time   NA 136 11/09/2017 1043   NA 140 09/24/2017 1550   K 3.6 11/09/2017 1043   CL 98 (L) 11/09/2017 1043   CO2 27 11/09/2017 1043   BUN 13 11/09/2017 1043   BUN 18 09/24/2017 1550   CREATININE 0.91 11/09/2017 1043   CREATININE 1.02 (H) 06/06/2017 1104      Component Value Date/Time   CALCIUM 9.3  11/09/2017 1043   ALKPHOS 92 11/09/2017 1043   AST 27 11/09/2017 1043   ALT 15 11/09/2017 1043   BILITOT 1.5 (H) 11/09/2017 1043     Lab Results  Component Value Date   IRON 60 07/25/2017   TIBC 465 (H) 07/25/2017   FERRITIN 9 (L) 07/25/2017    PENDING LABS:   RADIOGRAPHIC STUDIES:  No results found.   PATHOLOGY:    ASSESSMENT AND PLAN:  IDA (iron deficiency anemia) IDA, having required IV iron replacement therapy.  GI work-up is pending (waiting on cardiac clearance prior to diagnostic EGD/colonoscopy).  Labs today: CBC diff, CMET, iron/TIBC, ferritin.  I personally reviewed and went over laboratory results with the patient.  The results are noted within this dictation.  Labs in 8 weeks: CBC diff, iron/TIBC, ferritin.  Labs in 16 weeks: CBC diff, BMET, iron/TIBC, ferritin.  Return in 16 weeks for follow-up.  2. Chronic atrial fibrillation (HCC) Irregular rhythm and rate noted on auscultation today.  Asymptomatic.  3. COPD with hypoxia ([place]) Secondary to history of tobacco abuse.  4. Current use of long term anticoagulation On Eliquis for atrial fibrillation  5. Essential hypertension Diastolic is above goal.  Will defer to cardiology.  6. Major depression, recurrent, chronic (HCC) Stable, in remission  7. Morbid obesity ([place]) Discussed healthier eating choices including an increase in water and vegetables and fruits.  8. OSA on CPAP On oxygen  9. Oxygen dependent On oxygen  10. Pulmonary hypertension, primary ([place]) Followed by cardiology  11. Type 2 diabetes mellitus with other specified complication, without long-term current use of insulin (HCC) Hyperglycemia noted today at 171.  Will defer to primary care provider  12. Dark urine A symptomatically UTI standpoint.  Given that she is on Eliquis and the fact that she is iron deficient, we will perform a UA to evaluate for hematuria. - Urinalysis, Routine w reflex microscopic; Future - Urine  culture; Future - Urine culture - Urinalysis, Routine w reflex microscopic   Final Result of Complexity      Choose decision making level with 2 or 3 checks OR choose the decision making level on Section B       A Number  of diagnoses or treatment options  []   </= 1 Minimal  []   2 Limited  []   3 Multiple  [x]   >/= 4 Extensive  B Amount and complexity of data  []   </= 1 Minimal or low  []   2 Limited  [x]   3  Moderate  []   >/= 4 Extensive  C Highest risk  []   Minimal  []   Low  [x]   Moderate  []   High   Type of decision making  []   Straight-forward  []   Low Complexity  [x]   Moderate- Complexity  []   High- Complexity     ORDERS PLACED FOR THIS ENCOUNTER: Orders Placed This Encounter  Procedures  . Urine culture  . CBC with Differential  . Iron and TIBC  . Ferritin  . CBC with Differential  . Basic metabolic panel  . Iron and TIBC  . Ferritin  . Urinalysis, Routine w reflex microscopic    MEDICATIONS PRESCRIBED THIS ENCOUNTER: No orders of the defined types were placed in this encounter.   THERAPY PLAN:  Continue to monitor for iron deficiency and provide IV iron when indicated.  All questions were answered. The patient knows to call the clinic with any problems, questions or concerns. We can certainly see the patient much sooner if necessary.  Patient and plan discussed with Dr. Irene Limbo and he is in agreement with the aforementioned.   This note is electronically signed by: Robynn Pane, PA-C 11/09/2017 5:06 PM

## 2017-11-09 NOTE — Assessment & Plan Note (Addendum)
IDA, having required IV iron replacement therapy.  GI work-up is pending (waiting on cardiac clearance prior to diagnostic EGD/colonoscopy).  Labs today: CBC diff, CMET, iron/TIBC, ferritin.  I personally reviewed and went over laboratory results with the patient.  The results are noted within this dictation.  Labs in 8 weeks: CBC diff, iron/TIBC, ferritin.  Labs in 16 weeks: CBC diff, BMET, iron/TIBC, ferritin.  Return in 16 weeks for follow-up.

## 2017-11-09 NOTE — Patient Instructions (Addendum)
[place] at [facility] Discharge Instructions  RECOMMENDATIONS MADE BY THE CONSULTANT AND ANY TEST RESULTS WILL BE SENT TO YOUR REFERRING PHYSICIAN.  Laboratory work completed today. Laboratory work in 8 weeks. Laboratory work in 16 weeks. Return for follow-up in 16 weeks. Mammogram is ordered by primary care provider.  Please call radiology to schedule accordingly. Follow-up with cardiology as directed. Follow-up with primary care provider as directed. We will check urine for blood today.    Thank you for choosing [place] at [facility] to provide your oncology and hematology care.  To afford each patient quality time with our provider, please arrive at least 15 minutes before your scheduled appointment time.    If you have a lab appointment with the [facility] please come in thru the  Main Entrance and check in at the main information desk  You need to re-schedule your appointment should you arrive 10 or more minutes late.  We strive to give you quality time with our providers, and arriving late affects you and other patients whose appointments are after yours.  Also, if you no show three or more times for appointments you may be dismissed from the clinic at the providers discretion.     Again, thank you for choosing [facility]  Our hope is that these requests will decrease the amount of time that you wait before being seen by our physicians.       _____________________________________________________________  Should you have questions after your visit to [facility], please contact our office at [phone] between the hours of 8:30 a.m. and 4:30 p.m.  Voicemails left after 4:30 p.m. will not be returned until the following business day.  For prescription refill requests, have your pharmacy contact our office.       Resources For Cancer Patients and their Caregivers ? American Cancer Society: Can  assist with transportation, wigs, general needs, runs Look Good Feel Better.        320 236 4651 ? Cancer Care: Provides financial assistance, online support groups, medication/co-pay assistance.  1-800-813-HOPE (856)001-7590) ? [place] Assists [place] cancer patients and their families through emotional , educational and financial support.  (780)183-2964 ? [place] DSS Where to apply for food stamps, Medicaid and utility assistance. (204)329-0052 ? RCATS: Transportation to medical appointments. 8641000114 ? Social Security Administration: May apply for disability if have a Stage IV cancer. 930-416-3463 (323)429-8999 ? [employer], Disability and Transit Services: Assists with nutrition, care and transit needs. [place] Support Programs: @10RELATIVEDAYS @ > Cancer Support Group  2nd Tuesday of the month 1pm-2pm, Journey Room  > Creative Journey  3rd Tuesday of the month 1130am-1pm, Journey Room  > Look Good Feel Better  1st Wednesday of the month 10am-12 noon, Journey Room (Call [place] to register [phone])

## 2017-11-11 ENCOUNTER — Other Ambulatory Visit (HOSPITAL_COMMUNITY): Payer: Self-pay | Admitting: Adult Health

## 2017-11-11 LAB — URINE CULTURE: CULTURE: NO GROWTH

## 2017-11-14 ENCOUNTER — Ambulatory Visit: Payer: Medicare Other | Admitting: Nutrition

## 2017-11-16 ENCOUNTER — Other Ambulatory Visit: Payer: Self-pay

## 2017-11-16 ENCOUNTER — Inpatient Hospital Stay (HOSPITAL_COMMUNITY): Payer: Medicare Other

## 2017-11-16 ENCOUNTER — Encounter (HOSPITAL_COMMUNITY): Payer: Self-pay

## 2017-11-16 VITALS — BP 132/59 | HR 64 | Temp 98.6°F | Resp 16 | Wt 244.0 lb

## 2017-11-16 DIAGNOSIS — D509 Iron deficiency anemia, unspecified: Secondary | ICD-10-CM | POA: Diagnosis not present

## 2017-11-16 DIAGNOSIS — Z7901 Long term (current) use of anticoagulants: Secondary | ICD-10-CM | POA: Diagnosis not present

## 2017-11-16 DIAGNOSIS — I482 Chronic atrial fibrillation: Secondary | ICD-10-CM | POA: Diagnosis not present

## 2017-11-16 DIAGNOSIS — Z9981 Dependence on supplemental oxygen: Secondary | ICD-10-CM | POA: Diagnosis not present

## 2017-11-16 DIAGNOSIS — Z87891 Personal history of nicotine dependence: Secondary | ICD-10-CM | POA: Diagnosis not present

## 2017-11-16 DIAGNOSIS — J449 Chronic obstructive pulmonary disease, unspecified: Secondary | ICD-10-CM | POA: Diagnosis not present

## 2017-11-16 MED ORDER — SODIUM CHLORIDE 0.9 % IV SOLN
510.0000 mg | Freq: Once | INTRAVENOUS | Status: AC
  Administered 2017-11-16: 510 mg via INTRAVENOUS
  Filled 2017-11-16: qty 17

## 2017-11-16 MED ORDER — SODIUM CHLORIDE 0.9 % IV SOLN
INTRAVENOUS | Status: DC
  Administered 2017-11-16: 11:00:00 via INTRAVENOUS

## 2017-11-16 NOTE — Patient Instructions (Signed)
[facility] at [facility] Discharge Instructions  RECOMMENDATIONS MADE BY THE CONSULTANT AND ANY TEST RESULTS WILL BE SENT TO YOUR REFERRING PHYSICIAN.  Feraheme given today Follow up as scheduled.  Thank you for choosing [facility] at [facility] to provide your oncology and hematology care.  To afford each patient quality time with our provider, please arrive at least 15 minutes before your scheduled appointment time.    If you have a lab appointment with the Cancer Center please come in thru the  Main Entrance and check in at the main information desk  You need to re-schedule your appointment should you arrive 10 or more minutes late.  We strive to give you quality time with our providers, and arriving late affects you and other patients whose appointments are after yours.  Also, if you no show three or more times for appointments you may be dismissed from the clinic at the providers discretion.     Again, thank you for choosing [facility].  Our hope is that these requests will decrease the amount of time that you wait before being seen by our physicians.       _____________________________________________________________  Should you have questions after your visit to [facility], please contact our office at [phone] between the hours of 8:30 a.m. and 4:30 p.m.  Voicemails left after 4:30 p.m. will not be returned until the following business day.  For prescription refill requests, have your pharmacy contact our office.       Resources For Cancer Patients and their Caregivers ? American Cancer Society: Can assist with transportation, wigs, general needs, runs Look Good Feel Better.        1-888-227-6333 ? Cancer Care: Provides financial assistance, online support groups, medication/co-pay assistance.  1-800-813-HOPE (4673) ? [facility] Assists [place] cancer patients and  their families through emotional , educational and financial support.  336-427-4357 ? [place] DSS Where to apply for food stamps, Medicaid and utility assistance. 336-342-1394 ? RCATS: Transportation to medical appointments. 336-347-2287 ? Social Security Administration: May apply for disability if have a Stage IV cancer. 336-342-7796 1-800-772-1213 ? [place] Aging, Disability and Transit Services: Assists with nutrition, care and transit needs. 336-349-2343  Cancer Center Support Programs: @10RELATIVEDAYS@ > Cancer Support Group  2nd Tuesday of the month 1pm-2pm, Journey Room  > Creative Journey  3rd Tuesday of the month 1130am-1pm, Journey Room  > Look Good Feel Better  1st Wednesday of the month 10am-12 noon, Journey Room (Call American Cancer Society to register 1-800-395-5775)   

## 2017-11-16 NOTE — Progress Notes (Signed)
Treatment given per orders. Patient tolerated it well without problems. Vitals stable and discharged home from clinic ambulatory. Follow up as scheduled.  

## 2017-11-19 ENCOUNTER — Ambulatory Visit (INDEPENDENT_AMBULATORY_CARE_PROVIDER_SITE_OTHER): Payer: Self-pay | Admitting: *Deleted

## 2017-11-19 DIAGNOSIS — I4892 Unspecified atrial flutter: Secondary | ICD-10-CM

## 2017-11-19 LAB — CUP PACEART INCLINIC DEVICE CHECK
Implantable Pulse Generator Implant Date: 20190212
MDC IDC SESS DTM: 20190225110038

## 2017-11-19 NOTE — Progress Notes (Signed)
Wound check appointment. Steri-strips removed by patient prior to appointment. Wound without redness or edema. Incision edges approximated, wound well healed. Normal device function. Battery status: good. R-waves 0.55mV. Pause and brady detection remain off since implant. No tachy episodes. 8 symptom episodes--ECGs show AF. 67 AF episodes (7.8% burden), +Eliquis, reprogrammed episode storage to longest episode only per JA. Patient educated about wound care and will plan to continue Eliquis per JA. Monthly summary reports and ROV with JA/E on 12/28/17.

## 2017-11-20 ENCOUNTER — Other Ambulatory Visit: Payer: Self-pay | Admitting: Internal Medicine

## 2017-11-23 ENCOUNTER — Inpatient Hospital Stay (HOSPITAL_COMMUNITY): Payer: Medicare Other | Attending: Internal Medicine

## 2017-11-23 ENCOUNTER — Ambulatory Visit (HOSPITAL_COMMUNITY): Payer: Medicare Other

## 2017-11-23 ENCOUNTER — Encounter (HOSPITAL_COMMUNITY): Payer: Self-pay

## 2017-11-23 VITALS — BP 133/60 | HR 66 | Temp 97.5°F | Resp 18

## 2017-11-23 DIAGNOSIS — D509 Iron deficiency anemia, unspecified: Secondary | ICD-10-CM | POA: Diagnosis not present

## 2017-11-23 DIAGNOSIS — D500 Iron deficiency anemia secondary to blood loss (chronic): Secondary | ICD-10-CM

## 2017-11-23 MED ORDER — SODIUM CHLORIDE 0.9 % IV SOLN
510.0000 mg | Freq: Once | INTRAVENOUS | Status: AC
  Administered 2017-11-23: 510 mg via INTRAVENOUS
  Filled 2017-11-23: qty 17

## 2017-11-23 MED ORDER — SODIUM CHLORIDE 0.9 % IV SOLN
Freq: Once | INTRAVENOUS | Status: AC
  Administered 2017-11-23: 09:00:00 via INTRAVENOUS

## 2017-11-23 NOTE — Progress Notes (Signed)
Linda [name] tolerated Feraheme infusion well without complaints or incident. VSS upon discharge. Pt discharged via wheelchair in satisfactory condition

## 2017-11-23 NOTE — Patient Instructions (Signed)
[facility] at [facility] Discharge Instructions  RECOMMENDATIONS MADE BY THE CONSULTANT AND ANY TEST RESULTS WILL BE SENT TO YOUR REFERRING PHYSICIAN.  Received Feraheme infusion today.Follow-up as scheduled. Call clinic for any questions or concerns  Thank you for choosing  [facility] at [facility] to provide your oncology and hematology care.  To afford each patient quality time with our provider, please arrive at least 15 minutes before your scheduled appointment time.    If you have a lab appointment with the Cancer Center please come in thru the  Main Entrance and check in at the main information desk  You need to re-schedule your appointment should you arrive 10 or more minutes late.  We strive to give you quality time with our providers, and arriving late affects you and other patients whose appointments are after yours.  Also, if you no show three or more times for appointments you may be dismissed from the clinic at the providers discretion.     Again, thank you for choosing [facility].  Our hope is that these requests will decrease the amount of time that you wait before being seen by our physicians.       _____________________________________________________________  Should you have questions after your visit to [facility], please contact our office at [phone] between the hours of 8:30 a.m. and 4:30 p.m.  Voicemails left after 4:30 p.m. will not be returned until the following business day.  For prescription refill requests, have your pharmacy contact our office.       Resources For Cancer Patients and their Caregivers ? American Cancer Society: Can assist with transportation, wigs, general needs, runs Look Good Feel Better.        1-888-227-6333 ? Cancer Care: Provides financial assistance, online support groups, medication/co-pay assistance.  1-800-813-HOPE (4673) ? [facility] Assists [place] cancer patients and their families through emotional , educational and financial support.  336-427-4357 ? [place] DSS Where to apply for food stamps, Medicaid and utility assistance. 336-342-1394 ? RCATS: Transportation to medical appointments. 336-347-2287 ? Social Security Administration: May apply for disability if have a Stage IV cancer. 336-342-7796 1-800-772-1213 ? [place] Aging, Disability and Transit Services: Assists with nutrition, care and transit needs. 336-349-2343  Cancer Center Support Programs: @10RELATIVEDAYS@ > Cancer Support Group  2nd Tuesday of the month 1pm-2pm, Journey Room  > Creative Journey  3rd Tuesday of the month 1130am-1pm, Journey Room  > Look Good Feel Better  1st Wednesday of the month 10am-12 noon, Journey Room (Call American Cancer Society to register 1-800-395-5775)   

## 2017-11-30 ENCOUNTER — Ambulatory Visit (INDEPENDENT_AMBULATORY_CARE_PROVIDER_SITE_OTHER): Payer: Medicare Other | Admitting: Cardiovascular Disease

## 2017-11-30 ENCOUNTER — Encounter: Payer: Self-pay | Admitting: Cardiovascular Disease

## 2017-11-30 VITALS — BP 131/80 | HR 60 | Ht 62.0 in | Wt 241.0 lb

## 2017-11-30 DIAGNOSIS — I10 Essential (primary) hypertension: Secondary | ICD-10-CM | POA: Diagnosis not present

## 2017-11-30 DIAGNOSIS — G4733 Obstructive sleep apnea (adult) (pediatric): Secondary | ICD-10-CM | POA: Diagnosis not present

## 2017-11-30 DIAGNOSIS — I4891 Unspecified atrial fibrillation: Secondary | ICD-10-CM | POA: Diagnosis not present

## 2017-11-30 DIAGNOSIS — I2510 Atherosclerotic heart disease of native coronary artery without angina pectoris: Secondary | ICD-10-CM | POA: Diagnosis not present

## 2017-11-30 DIAGNOSIS — Z8679 Personal history of other diseases of the circulatory system: Secondary | ICD-10-CM

## 2017-11-30 DIAGNOSIS — I272 Pulmonary hypertension, unspecified: Secondary | ICD-10-CM | POA: Diagnosis not present

## 2017-11-30 DIAGNOSIS — Z9889 Other specified postprocedural states: Secondary | ICD-10-CM | POA: Diagnosis not present

## 2017-11-30 DIAGNOSIS — R0609 Other forms of dyspnea: Secondary | ICD-10-CM

## 2017-11-30 NOTE — Progress Notes (Signed)
SUBJECTIVE: The patient presents for routine follow-up.  She underwent atrial flutter ablation on 10/04/17.  She continued to have palpitations and due to a concern for possible atrial fibrillation, a loop recorder was implanted on 11/06/17. I reviewed her most recent device interrogation on 11/19/17 which demonstrated normal device function with a 7.8% burden of atrial fibrillation.  She is anticoagulated with Eliquis.  She is feeling much better and symptoms of shortness of breath have significantly improved.  Oxygen saturations are 96% in our office on room air.  She rarely needs to use oxygen and appears to only now use it at night when she is using CPAP.  She said they are changing this to BiPAP.  She denies chest pain.  She is also trying to lose weight.   Review of Systems: As per "subjective", otherwise negative.  Allergies  Allergen Reactions  . Trazodone Hives and Other (See Comments)    "blisters my skin"   . Trazodone And Nefazodone Hives and Other (See Comments)    "blisters my skin"   . Nickel Other (See Comments)    "oozing"  . Adhesive [Tape] Itching  . Bactrim [Sulfamethoxazole-Trimethoprim] Itching and Rash  . Penicillins Itching, Rash and Other (See Comments)    Has patient had a PCN reaction causing immediate rash, facial/tongue/throat swelling, SOB or lightheadedness with hypotension: Unknown Has patient had a PCN reaction causing severe rash involving mucus membranes or skin necrosis: Unknown Has patient had a PCN reaction that required hospitalization: No Has patient had a PCN reaction occurring within the last 10 years: No If all of the above answers are "NO", then may proceed with Cephalosporin use  . Ppd [Tuberculin Purified Protein Derivative] Swelling and Other (See Comments)    Local arm swelling  . Sulfamethoxazole-Trimethoprim Itching and Rash    Current Outpatient Medications  Medication Sig Dispense Refill  . acetaminophen (TYLENOL) 500 MG  tablet Take 1,000 mg by mouth every 6 (six) hours as needed (for pain.).     Marland Kitchen albuterol (PROVENTIL HFA;VENTOLIN HFA) 108 (90 Base) MCG/ACT inhaler Inhale 1-2 puffs into the lungs every 6 (six) hours as needed for wheezing or shortness of breath.     Marland Kitchen apixaban (ELIQUIS) 5 MG TABS tablet Take 1 tablet (5 mg total) by mouth 2 (two) times daily. 180 tablet 3  . cetirizine (ZYRTEC) 10 MG tablet Take 10 mg by mouth at bedtime.     . cholecalciferol (VITAMIN D) 1000 units tablet Take 2,000 Units by mouth daily.    . DULoxetine (CYMBALTA) 30 MG capsule Take 30 mg by mouth daily.    . Fluticasone-Salmeterol (ADVAIR DISKUS) 250-50 MCG/DOSE AEPB Inhale 1 puff into the lungs 2 (two) times daily.    . furosemide (LASIX) 20 MG tablet Take 1 tablet (20 mg total) by mouth as needed. 30 tablet 1  . guaiFENesin (MUCINEX) 600 MG 12 hr tablet Take 600-1,200 mg by mouth 2 (two) times daily as needed (for cough or congestion.).    Marland Kitchen metFORMIN (GLUCOPHAGE-XR) 500 MG 24 hr tablet Take 2 tablets (1,000 mg total) by mouth daily with breakfast. 180 tablet 3  . metoprolol succinate (TOPROL-XL) 50 MG 24 hr tablet Take 50 mg by mouth 2 (two) times daily. Take with or immediately following a meal.    . OXYGEN Inhale into the lungs as needed.    . pravastatin (PRAVACHOL) 20 MG tablet Take 1 tablet (20 mg total) daily by mouth. (Patient taking differently: Take 20 mg  by mouth at bedtime. ) 90 tablet 3  . tiotropium (SPIRIVA) 18 MCG inhalation capsule Place 18 mcg into inhaler and inhale daily.     No current facility-administered medications for this visit.     Past Medical History:  Diagnosis Date  . Allergy   . Anemia 07/25/2017  . Anxiety   . Arthritis   . Atrial fibrillation ([place])   . Cataract   . COPD (chronic obstructive pulmonary disease) ([place])   . Depression   . Diabetes mellitus without complication ([place])   . Emphysema of lung ([place])   . Hyperlipidemia   . Hypertension   . Oxygen deficiency   . Sleep apnea    . Typical atrial flutter [facility])     Past Surgical History:  Procedure Laterality Date  . A-FLUTTER ABLATION N/A 10/04/2017   Procedure: A-FLUTTER ABLATION;  Surgeon: Thompson Grayer, MD;  Location: [facility];  Service: Cardiovascular;  Laterality: N/A;  . ABDOMINAL HYSTERECTOMY     fibroids  . blood clot removal from base of brain  1990  . CARPAL TUNNEL RELEASE    . CATARACT EXTRACTION W/PHACO Left 06/16/2013   Procedure: CATARACT EXTRACTION PHACO AND INTRAOCULAR LENS PLACEMENT (IOC);  Surgeon: Tonny Branch, MD;  Location: [facility];  Service: Ophthalmology;  Laterality: Left;  CDE:  12.18  . CATARACT EXTRACTION W/PHACO Right 06/26/2013   Procedure: CATARACT EXTRACTION PHACO AND INTRAOCULAR LENS PLACEMENT (IOC);  Surgeon: Tonny Branch, MD;  Location: [facility];  Service: Ophthalmology;  Laterality: Right;  CDE:14.71  . CESAREAN SECTION    . CHOLECYSTECTOMY    . DILATION AND CURETTAGE OF UTERUS    . EYE SURGERY  12/2016  . FOOT NEUROMA SURGERY Right   . LOOP RECORDER INSERTION N/A 11/06/2017   Procedure: LOOP RECORDER INSERTION;  Surgeon: Thompson Grayer, MD;  Location: [facility];  Service: Cardiovascular;  Laterality: N/A;  . MOUTH SURGERY    . TONSILLECTOMY      Social History   Socioeconomic History  . Marital status: Widowed    Spouse name: Not on file  . Number of children: 1  . Years of education: 21  . Highest education level: Not on file  Social Needs  . Financial resource strain: Not on file  . Food insecurity - worry: Not on file  . Food insecurity - inability: Not on file  . Transportation needs - medical: Not on file  . Transportation needs - non-medical: Not on file  Occupational History  . Occupation: retired    Comment: catering, Engineer, maintenance  Tobacco Use  . Smoking status: Former Smoker    Packs/day: 1.50    Types: Cigarettes    Start date: 09/25/1958    Last attempt to quit: 06/06/2001    Years since quitting: 16.4  . Smokeless tobacco: Never Used    Substance and Sexual Activity  . Alcohol use: No  . Drug use: No  . Sexual activity: Not Currently  Other Topics Concern  . Not on file  Social History Narrative   Lives alone   Son lives in [place]   TV, reads, puzzles     Vitals:   11/30/17 1129  BP: 131/80  Pulse: 60  SpO2: 96%  Weight: 241 lb (109.3 kg)  Height: 5\' 2"  (1.575 m)    Wt Readings from Last 3 Encounters:  11/30/17 241 lb (109.3 kg)  11/16/17 244 lb (110.7 kg)  11/09/17 246 lb (111.6 kg)     PHYSICAL EXAM General:  NAD HEENT: Normal. Neck: No JVD, no thyromegaly. Lungs: Clear to auscultation bilaterally with normal respiratory effort. CV: Regular rate and rhythm, normal S1/S2, no S3/S4, no murmur. No pretibial or periankle edema. Abdomen: Soft, nontender, no distention.  Neurologic: Alert and oriented.  Psych: Normal affect. Skin: Normal. Musculoskeletal: No gross deformities.    ECG: Most recent ECG reviewed.   Labs: Lab Results  Component Value Date/Time   K 3.6 11/09/2017 10:43 AM   BUN 13 11/09/2017 10:43 AM   BUN 18 09/24/2017 03:50 PM   CREATININE 0.91 11/09/2017 10:43 AM   CREATININE 1.02 (H) 06/06/2017 11:04 AM   ALT 15 11/09/2017 10:43 AM   HGB 10.4 (L) 11/09/2017 10:43 AM   HGB 11.2 09/24/2017 03:50 PM     Lipids: Lab Results  Component Value Date/Time   CHOL 140 06/06/2017 11:04 AM   TRIG 141 06/06/2017 11:04 AM   HDL 47 (L) 06/06/2017 11:04 AM       ASSESSMENT AND PLAN:  1. Persistent atrial fibrillation:Symptomatically stable on Toprol-XL 50 mg twice daily. Anticoagulated with Eliquis.  No changes to therapy.  She has an implantable loop recorder with most recent interrogation documented above.  2. Pulmonary hypertension:This was seen on CTA of the chest. Moderately elevated pulmonarypressures by echocardiography. Likely due to COPD. Diastolic function was normal.  3. Coronary artery calcifications on CT/aortic atherosclerosis with progressive  exertional dyspnea:Symptoms have markedly improved.  Nuclear stress test in June 2013 demonstrated "probably normal perfusion". Cardiac function is normal.Continue beta blocker and statin. I no longer think stress testing is indicated as symptoms have significantly improved and may have been related to subclinical atrial flutter and atrial fibrillation.  4.Obstructive sleep apnea:  She is being switched from CPAP to BiPAP.  She is trying to lose weight.  5.  COPD progressive exertional dyspnea: Symptomatically improved.    Disposition: Follow up with Dr. Rayann Heman as previously scheduled.  Follow-up with me as needed.   Kate Sable, M.D., F.A.C.C.

## 2017-11-30 NOTE — Patient Instructions (Signed)
Medication Instructions:  Continue all current medications.  Labwork: none  Testing/Procedures: none  Follow-Up: As needed.    Any Other Special Instructions Will Be Listed Below (If Applicable).  If you need a refill on your cardiac medications before your next appointment, please call your pharmacy.  

## 2017-12-10 ENCOUNTER — Ambulatory Visit (INDEPENDENT_AMBULATORY_CARE_PROVIDER_SITE_OTHER): Payer: Medicare Other | Admitting: *Deleted

## 2017-12-10 DIAGNOSIS — I4891 Unspecified atrial fibrillation: Secondary | ICD-10-CM

## 2017-12-10 NOTE — Progress Notes (Signed)
Carelink Summary Report / Loop Recorder 

## 2017-12-12 DIAGNOSIS — E1140 Type 2 diabetes mellitus with diabetic neuropathy, unspecified: Secondary | ICD-10-CM | POA: Diagnosis not present

## 2017-12-12 DIAGNOSIS — B351 Tinea unguium: Secondary | ICD-10-CM | POA: Diagnosis not present

## 2017-12-12 DIAGNOSIS — M79671 Pain in right foot: Secondary | ICD-10-CM | POA: Diagnosis not present

## 2017-12-12 DIAGNOSIS — M792 Neuralgia and neuritis, unspecified: Secondary | ICD-10-CM | POA: Diagnosis not present

## 2017-12-17 ENCOUNTER — Other Ambulatory Visit: Payer: Self-pay | Admitting: Internal Medicine

## 2017-12-25 DIAGNOSIS — R0602 Shortness of breath: Secondary | ICD-10-CM | POA: Diagnosis not present

## 2017-12-25 DIAGNOSIS — R918 Other nonspecific abnormal finding of lung field: Secondary | ICD-10-CM | POA: Diagnosis not present

## 2017-12-25 DIAGNOSIS — J9612 Chronic respiratory failure with hypercapnia: Secondary | ICD-10-CM | POA: Diagnosis not present

## 2017-12-25 DIAGNOSIS — Z87891 Personal history of nicotine dependence: Secondary | ICD-10-CM | POA: Diagnosis not present

## 2017-12-25 DIAGNOSIS — G4733 Obstructive sleep apnea (adult) (pediatric): Secondary | ICD-10-CM | POA: Diagnosis not present

## 2017-12-25 DIAGNOSIS — R0689 Other abnormalities of breathing: Secondary | ICD-10-CM | POA: Diagnosis not present

## 2017-12-25 DIAGNOSIS — J449 Chronic obstructive pulmonary disease, unspecified: Secondary | ICD-10-CM | POA: Diagnosis not present

## 2017-12-25 DIAGNOSIS — J984 Other disorders of lung: Secondary | ICD-10-CM | POA: Diagnosis not present

## 2017-12-28 ENCOUNTER — Ambulatory Visit (INDEPENDENT_AMBULATORY_CARE_PROVIDER_SITE_OTHER): Payer: Medicare Other | Admitting: Internal Medicine

## 2017-12-28 ENCOUNTER — Encounter: Payer: Self-pay | Admitting: Internal Medicine

## 2017-12-28 VITALS — BP 130/72 | HR 62 | Ht 62.5 in | Wt 244.0 lb

## 2017-12-28 DIAGNOSIS — I480 Paroxysmal atrial fibrillation: Secondary | ICD-10-CM

## 2017-12-28 DIAGNOSIS — R634 Abnormal weight loss: Secondary | ICD-10-CM | POA: Diagnosis not present

## 2017-12-28 DIAGNOSIS — G4733 Obstructive sleep apnea (adult) (pediatric): Secondary | ICD-10-CM | POA: Diagnosis not present

## 2017-12-28 DIAGNOSIS — I2510 Atherosclerotic heart disease of native coronary artery without angina pectoris: Secondary | ICD-10-CM

## 2017-12-28 NOTE — Progress Notes (Signed)
PCP: Caren Macadam, MD Primary Cardiologist: Dr Bronson Ing Primary EP: Dr Coral Ceo Linda [name] is a 72 y.o. female who presents today for routine electrophysiology followup.  Since last being seen in our clinic, the patient reports doing very well.  Today, she denies symptoms of palpitations, chest pain, shortness of breath,  lower extremity edema, dizziness, presyncope, or syncope.  The patient is otherwise without complaint today.   Past Medical History:  Diagnosis Date  . Allergy   . Anemia 07/25/2017  . Anxiety   . Arthritis   . Atrial fibrillation ([place])   . Cataract   . COPD (chronic obstructive pulmonary disease) ([place])   . Depression   . Diabetes mellitus without complication ([place])   . Emphysema of lung ([place])   . Hyperlipidemia   . Hypertension   . Oxygen deficiency   . Sleep apnea   . Typical atrial flutter [facility])    Past Surgical History:  Procedure Laterality Date  . A-FLUTTER ABLATION N/A 10/04/2017   Procedure: A-FLUTTER ABLATION;  Surgeon: Thompson Grayer, MD;  Location: [facility];  Service: Cardiovascular;  Laterality: N/A;  . ABDOMINAL HYSTERECTOMY     fibroids  . blood clot removal from base of brain  1990  . CARPAL TUNNEL RELEASE    . CATARACT EXTRACTION W/PHACO Left 06/16/2013   Procedure: CATARACT EXTRACTION PHACO AND INTRAOCULAR LENS PLACEMENT (IOC);  Surgeon: Tonny Branch, MD;  Location: [facility];  Service: Ophthalmology;  Laterality: Left;  CDE:  12.18  . CATARACT EXTRACTION W/PHACO Right 06/26/2013   Procedure: CATARACT EXTRACTION PHACO AND INTRAOCULAR LENS PLACEMENT (IOC);  Surgeon: Tonny Branch, MD;  Location: [facility];  Service: Ophthalmology;  Laterality: Right;  CDE:14.71  . CESAREAN SECTION    . CHOLECYSTECTOMY    . DILATION AND CURETTAGE OF UTERUS    . EYE SURGERY  12/2016  . FOOT NEUROMA SURGERY Right   . LOOP RECORDER INSERTION N/A 11/06/2017   Procedure: LOOP RECORDER INSERTION;  Surgeon: Thompson Grayer, MD;  Location: [facility];   Service: Cardiovascular;  Laterality: N/A;  . MOUTH SURGERY    . TONSILLECTOMY      ROS- all systems are reviewed and negatives except as per HPI above  Current Outpatient Medications  Medication Sig Dispense Refill  . acetaminophen (TYLENOL) 500 MG tablet Take 1,000 mg by mouth every 6 (six) hours as needed (for pain.).     Marland Kitchen albuterol (PROVENTIL HFA;VENTOLIN HFA) 108 (90 Base) MCG/ACT inhaler Inhale 1-2 puffs into the lungs every 6 (six) hours as needed for wheezing or shortness of breath.     Marland Kitchen apixaban (ELIQUIS) 5 MG TABS tablet Take 1 tablet (5 mg total) by mouth 2 (two) times daily. 180 tablet 3  . cetirizine (ZYRTEC) 10 MG tablet Take 10 mg by mouth at bedtime.     . cholecalciferol (VITAMIN D) 1000 units tablet Take 2,000 Units by mouth daily.    . DULoxetine (CYMBALTA) 30 MG capsule Take 30 mg by mouth daily.    . Fluticasone-Salmeterol (ADVAIR DISKUS) 250-50 MCG/DOSE AEPB Inhale 1 puff into the lungs 2 (two) times daily.    . furosemide (LASIX) 20 MG tablet Take 1 tablet (20 mg total) by mouth as needed. 30 tablet 1  . guaiFENesin (MUCINEX) 600 MG 12 hr tablet Take 600-1,200 mg by mouth 2 (two) times daily as needed (for cough or congestion.).    Marland Kitchen metFORMIN (GLUCOPHAGE-XR) 500 MG 24 hr tablet Take 2 tablets (1,000 mg total)  by mouth daily with breakfast. 180 tablet 3  . metoprolol succinate (TOPROL-XL) 50 MG 24 hr tablet Take 50 mg by mouth 2 (two) times daily. Take with or immediately following a meal.    . OXYGEN Inhale into the lungs as needed.    . pravastatin (PRAVACHOL) 20 MG tablet Take 1 tablet (20 mg total) daily by mouth. (Patient taking differently: Take 20 mg by mouth at bedtime. ) 90 tablet 3  . tiotropium (SPIRIVA) 18 MCG inhalation capsule Place 18 mcg into inhaler and inhale daily.     No current facility-administered medications for this visit.     Physical Exam: Vitals:   12/28/17 0829  BP: 130/72  Pulse: 62  SpO2: 96%  Weight: 244 lb (110.7 kg)  Height:  5' 2.5" (1.588 m)    GEN- The patient is well appearing, alert and oriented x 3 today.   Head- normocephalic, atraumatic Eyes-  Sclera clear, conjunctiva pink Ears- hearing intact Oropharynx- clear Lungs- Clear to ausculation bilaterally, normal work of breathing Heart- Regular rate and rhythm, no murmurs, rubs or gallops, PMI not laterally displaced GI- soft, NT, ND, + BS Extremities- no clubbing, cyanosis, or edema   Assessment and Plan:  1. Atrial flutter/ afib S/p ablation for atrial flutter ILR has since documented afib V rates are mostly controlled Continue long term anticoagulation  2. Obesity Body mass index is 43.92 kg/m. Lifestyle modification encouraged Will refer to Dennard Nip for lifestyle modification   3. OSA Not using BiPAP  Follow-up with me in 6 months  Thompson Grayer MD, [facility] 12/28/2017 8:56 AM

## 2017-12-28 NOTE — Patient Instructions (Signed)
Medication Instructions:   May take an additional Toprol for increased heart rate.  Continue all other medications.    Labwork: none  Testing/Procedures: none  Follow-Up: Your physician wants you to follow up in: 6 months.  You will receive a reminder letter in the mail one-two months in advance.  If you don't receive a letter, please call our office to schedule the follow up appointment.  - Dr. Rayann Heman.   Any Other Special Instructions Will Be Listed Below (If Applicable). Continue monthly automatic reports.   If you need a refill on your cardiac medications before your next appointment, please call your pharmacy.

## 2018-01-08 DIAGNOSIS — Z9181 History of falling: Secondary | ICD-10-CM | POA: Diagnosis not present

## 2018-01-08 DIAGNOSIS — G4733 Obstructive sleep apnea (adult) (pediatric): Secondary | ICD-10-CM | POA: Diagnosis not present

## 2018-01-10 ENCOUNTER — Inpatient Hospital Stay (HOSPITAL_COMMUNITY): Payer: Medicare Other | Attending: Hematology

## 2018-01-10 DIAGNOSIS — D509 Iron deficiency anemia, unspecified: Secondary | ICD-10-CM | POA: Insufficient documentation

## 2018-01-10 DIAGNOSIS — D500 Iron deficiency anemia secondary to blood loss (chronic): Secondary | ICD-10-CM

## 2018-01-10 LAB — CBC WITH DIFFERENTIAL/PLATELET
BASOS PCT: 0 %
Basophils Absolute: 0 10*3/uL (ref 0.0–0.1)
EOS ABS: 0.1 10*3/uL (ref 0.0–0.7)
Eosinophils Relative: 2 %
HEMATOCRIT: 41.1 % (ref 36.0–46.0)
HEMOGLOBIN: 13 g/dL (ref 12.0–15.0)
Lymphocytes Relative: 22 %
Lymphs Abs: 1.1 10*3/uL (ref 0.7–4.0)
MCH: 31.3 pg (ref 26.0–34.0)
MCHC: 31.6 g/dL (ref 30.0–36.0)
MCV: 98.8 fL (ref 78.0–100.0)
Monocytes Absolute: 0.2 10*3/uL (ref 0.1–1.0)
Monocytes Relative: 4 %
NEUTROS ABS: 3.5 10*3/uL (ref 1.7–7.7)
Neutrophils Relative %: 72 %
Platelets: 105 10*3/uL — ABNORMAL LOW (ref 150–400)
RBC: 4.16 MIL/uL (ref 3.87–5.11)
RDW: 18.2 % — ABNORMAL HIGH (ref 11.5–15.5)
WBC: 4.8 10*3/uL (ref 4.0–10.5)

## 2018-01-10 LAB — IRON AND TIBC
IRON: 71 ug/dL (ref 28–170)
SATURATION RATIOS: 22 % (ref 10.4–31.8)
TIBC: 319 ug/dL (ref 250–450)
UIBC: 248 ug/dL

## 2018-01-10 LAB — FERRITIN: FERRITIN: 105 ng/mL (ref 11–307)

## 2018-01-10 LAB — CUP PACEART INCLINIC DEVICE CHECK
Date Time Interrogation Session: 20190418130731
MDC IDC PG IMPLANT DT: 20190212

## 2018-01-11 ENCOUNTER — Ambulatory Visit (INDEPENDENT_AMBULATORY_CARE_PROVIDER_SITE_OTHER): Payer: Medicare Other | Admitting: *Deleted

## 2018-01-11 DIAGNOSIS — I480 Paroxysmal atrial fibrillation: Secondary | ICD-10-CM | POA: Diagnosis not present

## 2018-01-11 NOTE — Progress Notes (Signed)
Carelink Summary Report / Loop Recorder 

## 2018-01-14 ENCOUNTER — Ambulatory Visit (INDEPENDENT_AMBULATORY_CARE_PROVIDER_SITE_OTHER): Payer: Medicare Other | Admitting: Family Medicine

## 2018-01-14 ENCOUNTER — Encounter: Payer: Self-pay | Admitting: Family Medicine

## 2018-01-14 ENCOUNTER — Other Ambulatory Visit: Payer: Self-pay

## 2018-01-14 VITALS — BP 134/74 | HR 74 | Temp 98.3°F | Resp 14 | Ht 62.5 in | Wt 241.0 lb

## 2018-01-14 DIAGNOSIS — J302 Other seasonal allergic rhinitis: Secondary | ICD-10-CM

## 2018-01-14 DIAGNOSIS — I482 Chronic atrial fibrillation, unspecified: Secondary | ICD-10-CM

## 2018-01-14 DIAGNOSIS — I10 Essential (primary) hypertension: Secondary | ICD-10-CM

## 2018-01-14 DIAGNOSIS — E1169 Type 2 diabetes mellitus with other specified complication: Secondary | ICD-10-CM | POA: Diagnosis not present

## 2018-01-14 MED ORDER — ALBUTEROL SULFATE HFA 108 (90 BASE) MCG/ACT IN AERS: 1.0000 | INHALATION_SPRAY | Freq: Four times a day (QID) | RESPIRATORY_TRACT | 0 refills | Status: DC | PRN

## 2018-01-14 MED ORDER — GLUCOSE BLOOD VI STRP: ORAL_STRIP | 0 refills | Status: DC

## 2018-01-14 NOTE — Patient Instructions (Signed)
You are doing well  Congratulations on losing weight  No change in medicine or therapy You may take for allergies: Allegra - fexofenadine Zyrtec - cetirizine Claritin - loratadine Nasal inhalers like flonase and nasocort May continue the mucinex to loosen mucous  Follow up Dr Mannie Stabile in 3 months

## 2018-01-14 NOTE — Progress Notes (Signed)
Chief Complaint  Patient presents with  . Hypertension    follow up  . morbid obesity  . Atrial Fibrillation    chronic   Patient is having trouble with her allergies.  We discussed which over-the-counter medicine she can take.  She states Zyrtec stopped working.  I advised her to switch to either Claritin or Allegra.  Given her the names of these medicines generics,(AVS).  She can also use a nasal spray such as Flonase or Nasacort.  She is using Mucinex.  Told her that this may loosen the mucus, will not help with the drip with cough. Her COPD is better.  She is no longer using oxygen.  She is more active.  She is smiling and quite pleased with her improvement. She weighed over 250 pounds in October.  Today she has lost 3 pounds in the last month and weighed in at 241.  I congratulated her on her efforts and reminded her of all the health benefits of weight loss. She has chronic atrial fibrillation and stays on Eliquis.  No bleeding or bruising.  Under the care of cardiology. She is compliant with her blood pressure medication.  Her blood pressure is well controlled.  She does not take an ACE inhibitor.  She is on Toprol per cardiology She takes pravastatin for her lipids.  Her last LDL was 73 She is compliant with her diabetes medication.  Her last hemoglobin A1c was 6.8   Patient Active Problem List   Diagnosis Date Noted  . Atrial flutter ([place]) 10/31/2017  . Bartholin's gland cyst 10/15/2017  . Major depression, recurrent, chronic ([place]) 10/15/2017  . IDA (iron deficiency anemia) 08/22/2017  . COPD with hypoxia ([place]) 06/06/2017  . Type 2 diabetes mellitus with other specified complication ([place]) 69/67/8938  . Hypertension 06/06/2017  . HLD (hyperlipidemia) 06/06/2017  . Atrial fibrillation ([place]) 06/06/2017  . Morbid obesity ([place]) 06/06/2017  . Current use of long term anticoagulation 06/06/2017  . OSA on CPAP 06/06/2017  . Oxygen dependent 06/06/2017  . Seasonal allergies  06/06/2017  . Pulmonary hypertension, primary ([place]) 06/06/2017  . CAD (coronary artery disease) 06/06/2017    Outpatient Encounter Medications as of 01/14/2018  Medication Sig  . acetaminophen (TYLENOL) 500 MG tablet Take 1,000 mg by mouth every 6 (six) hours as needed (for pain.).   Marland Kitchen albuterol (PROVENTIL HFA;VENTOLIN HFA) 108 (90 Base) MCG/ACT inhaler Inhale 1-2 puffs into the lungs every 6 (six) hours as needed for wheezing or shortness of breath.  Marland Kitchen apixaban (ELIQUIS) 5 MG TABS tablet Take 1 tablet (5 mg total) by mouth 2 (two) times daily.  . cholecalciferol (VITAMIN D) 1000 units tablet Take 2,000 Units by mouth daily.  . DULoxetine (CYMBALTA) 30 MG capsule Take 30 mg by mouth daily.  . Fluticasone-Salmeterol (ADVAIR DISKUS) 250-50 MCG/DOSE AEPB Inhale 1 puff into the lungs 2 (two) times daily.  . furosemide (LASIX) 20 MG tablet Take 1 tablet (20 mg total) by mouth as needed.  Marland Kitchen guaiFENesin (MUCINEX) 600 MG 12 hr tablet Take 600-1,200 mg by mouth 2 (two) times daily as needed (for cough or congestion.).  Marland Kitchen metFORMIN (GLUCOPHAGE-XR) 500 MG 24 hr tablet Take 2 tablets (1,000 mg total) by mouth daily with breakfast.  . metoprolol succinate (TOPROL-XL) 50 MG 24 hr tablet Take 50 mg by mouth 2 (two) times daily. Take with or immediately following a meal.  . OXYGEN Inhale into the lungs as needed.  . pravastatin (PRAVACHOL) 20 MG tablet Take 1  tablet (20 mg total) daily by mouth. (Patient taking differently: Take 20 mg by mouth at bedtime. )  . tiotropium (SPIRIVA) 18 MCG inhalation capsule Place 18 mcg into inhaler and inhale daily.   No facility-administered encounter medications on file as of 01/14/2018.     Allergies  Allergen Reactions  . Other Swelling    Local arm swelling- TB skin Test  . Trazodone Hives and Other (See Comments)    "blisters my skin"   . Trazodone And Nefazodone Hives and Other (See Comments)    "blisters my skin"   . Nickel Other (See Comments)    "oozing"  .  Adhesive [Tape] Itching  . Bactrim [Sulfamethoxazole-Trimethoprim] Itching and Rash  . Penicillins Itching, Rash and Other (See Comments)    Has patient had a PCN reaction causing immediate rash, facial/tongue/throat swelling, SOB or lightheadedness with hypotension: Unknown Has patient had a PCN reaction causing severe rash involving mucus membranes or skin necrosis: Unknown Has patient had a PCN reaction that required hospitalization: No Has patient had a PCN reaction occurring within the last 10 years: No If all of the above answers are "NO", then may proceed with Cephalosporin use  . Ppd [Tuberculin Purified Protein Derivative] Swelling and Other (See Comments)    Local arm swelling  . Sulfamethoxazole-Trimethoprim Itching and Rash    Review of Systems  Constitutional: Negative for activity change, appetite change, fatigue and unexpected weight change.       Continued slow weight loss  HENT: Positive for postnasal drip and rhinorrhea. Negative for congestion and dental problem.   Eyes: Negative for redness and visual disturbance.  Respiratory: Positive for cough and shortness of breath.        Improving.  Most of the cough is from postnasal drip/allergies  Cardiovascular: Negative for chest pain, palpitations and leg swelling.  Gastrointestinal: Negative for abdominal pain, constipation and diarrhea.  Genitourinary: Negative for difficulty urinating, frequency and vaginal bleeding.  Musculoskeletal: Positive for arthralgias and gait problem. Negative for back pain.       Chronic  Neurological: Negative for dizziness and headaches.  Psychiatric/Behavioral: Negative for dysphoric mood and sleep disturbance. The patient is not nervous/anxious.        Stable    Physical Exam  Constitutional: She is oriented to person, place, and time. She appears well-developed and well-nourished.  HENT:  Head: Normocephalic and atraumatic.  Mouth/Throat: Oropharynx is clear and moist.  Morbid  obesity  Eyes: Pupils are equal, round, and reactive to light. Conjunctivae are normal.  glasses  Neck: Normal range of motion. Neck supple.  Cardiovascular: Normal rate, regular rhythm and normal heart sounds.   regular  Pulmonary/Chest: Effort normal and breath sounds normal. She has no wheezes.  Abdominal: Soft. Bowel sounds are normal.  Musculoskeletal: Normal range of motion. She exhibits no edema.  Lymphadenopathy:    She has no cervical adenopathy.  Neurological: She is alert and oriented to person, place, and time.  Gait mildly antalgic  Skin: Skin is warm and dry.  Psychiatric: She has a normal mood and affect. Her behavior is normal. Thought content normal.  Nursing note and vitals reviewed.   BP 134/74   Pulse 74   Temp 98.3 F (36.8 C) (Oral)   Resp 14   Ht 5' 2.5" (1.588 m)   Wt 241 lb 0.6 oz (109.3 kg)   SpO2 92%   BMI 43.38 kg/m     ASSESSMENT/PLAN:  1. Type 2 diabetes mellitus with other specified complication,  without long-term current use of insulin ([place]) Well-controlled.  Hemoglobin A1c 6.8  2. Essential hypertension Well-controlled  3. Chronic atrial fibrillation ([facility]) On anticoagulation/Eliquis.  No complications.  Under care of cardiology  4. Morbid obesity ([place]) Patient is working to lose weight.  5. Seasonal allergies Discussed   Patient Instructions  You are doing well  Congratulations on losing weight  No change in medicine or therapy You may take for allergies: Allegra - fexofenadine Zyrtec - cetirizine Claritin - loratadine Nasal inhalers like flonase and nasocort May continue the mucinex to loosen mucous  Follow up Dr Mannie Stabile in 3 months   [name], MD

## 2018-01-18 LAB — CUP PACEART REMOTE DEVICE CHECK
Date Time Interrogation Session: 20190317134023
Implantable Pulse Generator Implant Date: 20190212

## 2018-01-23 ENCOUNTER — Telehealth: Payer: Self-pay | Admitting: Cardiology

## 2018-01-23 NOTE — Telephone Encounter (Signed)
Spoke w/ pt and requested that she send a manual transmission b/c her home monitor has not updated in at least 14 days.   

## 2018-01-26 ENCOUNTER — Other Ambulatory Visit: Payer: Self-pay | Admitting: Family Medicine

## 2018-02-07 LAB — CUP PACEART REMOTE DEVICE CHECK
Implantable Pulse Generator Implant Date: 20190212
MDC IDC SESS DTM: 20190419141007

## 2018-02-13 ENCOUNTER — Ambulatory Visit (INDEPENDENT_AMBULATORY_CARE_PROVIDER_SITE_OTHER): Payer: Medicare Other | Admitting: *Deleted

## 2018-02-13 DIAGNOSIS — I480 Paroxysmal atrial fibrillation: Secondary | ICD-10-CM

## 2018-02-14 NOTE — Progress Notes (Signed)
Carelink Summary Report / Loop Recorder 

## 2018-02-15 DIAGNOSIS — N762 Acute vulvitis: Secondary | ICD-10-CM | POA: Diagnosis not present

## 2018-02-19 ENCOUNTER — Other Ambulatory Visit: Payer: Self-pay | Admitting: *Deleted

## 2018-02-19 MED ORDER — APIXABAN 5 MG PO TABS: 5.0000 mg | ORAL_TABLET | Freq: Two times a day (BID) | ORAL | 3 refills | Status: AC

## 2018-02-20 DIAGNOSIS — M792 Neuralgia and neuritis, unspecified: Secondary | ICD-10-CM | POA: Diagnosis not present

## 2018-02-20 DIAGNOSIS — B351 Tinea unguium: Secondary | ICD-10-CM | POA: Diagnosis not present

## 2018-02-20 DIAGNOSIS — E1140 Type 2 diabetes mellitus with diabetic neuropathy, unspecified: Secondary | ICD-10-CM | POA: Diagnosis not present

## 2018-02-20 DIAGNOSIS — M79671 Pain in right foot: Secondary | ICD-10-CM | POA: Diagnosis not present

## 2018-02-28 ENCOUNTER — Encounter: Payer: Self-pay | Admitting: Family Medicine

## 2018-03-01 ENCOUNTER — Encounter: Payer: Self-pay | Admitting: Family Medicine

## 2018-03-07 ENCOUNTER — Other Ambulatory Visit: Payer: Self-pay | Admitting: Family Medicine

## 2018-03-07 ENCOUNTER — Telehealth (HOSPITAL_COMMUNITY): Payer: Self-pay | Admitting: *Deleted

## 2018-03-07 ENCOUNTER — Encounter (HOSPITAL_COMMUNITY): Payer: Self-pay | Admitting: Internal Medicine

## 2018-03-07 ENCOUNTER — Inpatient Hospital Stay (HOSPITAL_COMMUNITY): Payer: Medicare Other

## 2018-03-07 ENCOUNTER — Inpatient Hospital Stay (HOSPITAL_COMMUNITY): Payer: Medicare Other | Attending: Hematology | Admitting: Internal Medicine

## 2018-03-07 VITALS — BP 127/61 | HR 73 | Temp 97.9°F | Resp 16 | Wt 227.2 lb

## 2018-03-07 DIAGNOSIS — Z87891 Personal history of nicotine dependence: Secondary | ICD-10-CM

## 2018-03-07 DIAGNOSIS — D500 Iron deficiency anemia secondary to blood loss (chronic): Secondary | ICD-10-CM | POA: Diagnosis not present

## 2018-03-07 DIAGNOSIS — E119 Type 2 diabetes mellitus without complications: Secondary | ICD-10-CM | POA: Diagnosis not present

## 2018-03-07 DIAGNOSIS — R5383 Other fatigue: Secondary | ICD-10-CM

## 2018-03-07 DIAGNOSIS — I10 Essential (primary) hypertension: Secondary | ICD-10-CM | POA: Insufficient documentation

## 2018-03-07 DIAGNOSIS — Z1231 Encounter for screening mammogram for malignant neoplasm of breast: Secondary | ICD-10-CM

## 2018-03-07 LAB — CBC WITH DIFFERENTIAL/PLATELET
BASOS ABS: 0 10*3/uL (ref 0.0–0.1)
BASOS PCT: 0 %
EOS PCT: 1 %
Eosinophils Absolute: 0.1 10*3/uL (ref 0.0–0.7)
HCT: 45.6 % (ref 36.0–46.0)
Hemoglobin: 14.4 g/dL (ref 12.0–15.0)
LYMPHS PCT: 20 %
Lymphs Abs: 1.1 10*3/uL (ref 0.7–4.0)
MCH: 30.9 pg (ref 26.0–34.0)
MCHC: 31.6 g/dL (ref 30.0–36.0)
MCV: 97.9 fL (ref 78.0–100.0)
MONO ABS: 0.4 10*3/uL (ref 0.1–1.0)
Monocytes Relative: 6 %
Neutro Abs: 4.1 10*3/uL (ref 1.7–7.7)
Neutrophils Relative %: 73 %
PLATELETS: 155 10*3/uL (ref 150–400)
RBC: 4.66 MIL/uL (ref 3.87–5.11)
RDW: 13.3 % (ref 11.5–15.5)
WBC: 5.6 10*3/uL (ref 4.0–10.5)

## 2018-03-07 LAB — BASIC METABOLIC PANEL
ANION GAP: 9 (ref 5–15)
BUN: 18 mg/dL (ref 6–20)
CALCIUM: 9.4 mg/dL (ref 8.9–10.3)
CO2: 26 mmol/L (ref 22–32)
CREATININE: 1.01 mg/dL — AB (ref 0.44–1.00)
Chloride: 99 mmol/L — ABNORMAL LOW (ref 101–111)
GFR calc Af Amer: >60 mL/min (ref >60)
GFR calc non Af Amer: 55 mL/min — ABNORMAL LOW (ref >60)
GLUCOSE: 492 mg/dL — AB (ref 65–99)
Potassium: 4.3 mmol/L (ref 3.5–5.1)
Sodium: 134 mmol/L — ABNORMAL LOW (ref 135–145)

## 2018-03-07 LAB — IRON AND TIBC
Iron: 93 ug/dL (ref 28–170)
SATURATION RATIOS: 23 % (ref 10.4–31.8)
TIBC: 413 ug/dL (ref 250–450)
UIBC: 320 ug/dL

## 2018-03-07 LAB — FERRITIN: Ferritin: 24 ng/mL (ref 11–307)

## 2018-03-07 NOTE — Patient Instructions (Signed)
[facility] at [facility] Discharge Instructions  You saw Dr. Higgs today.   Thank you for choosing [facility] at [facility] to provide your oncology and hematology care.  To afford each patient quality time with our provider, please arrive at least 15 minutes before your scheduled appointment time.   If you have a lab appointment with the Cancer Center please come in thru the  Main Entrance and check in at the main information desk  You need to re-schedule your appointment should you arrive 10 or more minutes late.  We strive to give you quality time with our providers, and arriving late affects you and other patients whose appointments are after yours.  Also, if you no show three or more times for appointments you may be dismissed from the clinic at the providers discretion.     Again, thank you for choosing [facility].  Our hope is that these requests will decrease the amount of time that you wait before being seen by our physicians.       _____________________________________________________________  Should you have questions after your visit to [facility], please contact our office at [phone] between the hours of 8:30 a.m. and 4:30 p.m.  Voicemails left after 4:30 p.m. will not be returned until the following business day.  For prescription refill requests, have your pharmacy contact our office.       Resources For Cancer Patients and their Caregivers ? American Cancer Society: Can assist with transportation, wigs, general needs, runs Look Good Feel Better.        1-888-227-6333 ? Cancer Care: Provides financial assistance, online support groups, medication/co-pay assistance.  1-800-813-HOPE (4673) ? [facility] Assists [place] cancer patients and their families through emotional , educational and financial support.  336-427-4357 ? [place] DSS Where to apply for food  stamps, Medicaid and utility assistance. 336-342-1394 ? [facility]: Transportation to medical appointments. 336-347-2287 ? Social Security Administration: May apply for disability if have a Stage IV cancer. 336-342-7796 1-800-772-1213 ? [place] Aging, Disability and Transit Services: Assists with nutrition, care and transit needs. 336-349-2343  Cancer Center Support Programs:   > Cancer Support Group  2nd Tuesday of the month 1pm-2pm, Journey Room   > Creative Journey  3rd Tuesday of the month 1130am-1pm, Journey Room     

## 2018-03-07 NOTE — Telephone Encounter (Signed)
Spoke with the pt about her elevated blood sugar. Pt stated that she is aware of the elevated blood sugar and that she has an appointment for this tomorrow at her PCP.

## 2018-03-07 NOTE — Progress Notes (Signed)
Diagnosis Iron deficiency anemia due to chronic blood loss - Plan: CBC with Differential/Platelet, Comprehensive metabolic panel, Lactate dehydrogenase, Ferritin, CBC with Differential/Platelet, Comprehensive metabolic panel, Lactate dehydrogenase, Ferritin  Staging Cancer Staging No matching staging information was found for the patient.  Assessment and Plan:  1.  IDA.  Pt was last treated with IV iron in 11/2017.  Labs done 03/07/2018 reviewed with pt and show WBC 5.6 hb 14.4 and plts 155,000.  Iron studies are pending and she will contacted with results.  I have discussed with her HB is improved today from labs done 12/2017.  She is referred to GI for evaluation due to IDA.    2.  Fatigue.  I discussed with her HB is improved at 14 from labs done in 12/2017.   She reports she is dieting and walking.  Continue activity.  She will be notified of iron results.  BS is greatly elevated at 492 which may be contributing to symptoms.    3.  HTN.  BP is 127/61.  Continue to follow-up with PCP.    4.  DM. BS is greatly elevated at 492.  Pt is on Metformin.  She is advised to follow-up with PCP to determine if DM regimen needs adjustment.    Current Status:  Pt is seen today for follow-up.  She is here to go over labs.  She has not been seen by GI.    Problem List Patient Active Problem List   Diagnosis Date Noted  . Atrial flutter ([place]) [I48.92] 10/31/2017  . Bartholin's gland cyst [N75.0] 10/15/2017  . Major depression, recurrent, chronic ([place]) [F33.9] 10/15/2017  . IDA (iron deficiency anemia) [D50.9] 08/22/2017  . COPD with hypoxia ([place]) [J44.9, R09.02] 06/06/2017  . Type 2 diabetes mellitus with other specified complication ([place]) [T26.71] 06/06/2017  . Hypertension [I10] 06/06/2017  . HLD (hyperlipidemia) [E78.5] 06/06/2017  . Atrial fibrillation ([place]) [I48.91] 06/06/2017  . Morbid obesity ([place]) [E66.01] 06/06/2017  . Current use of long term anticoagulation [Z79.01] 06/06/2017  . OSA on  CPAP [G47.33, Z99.89] 06/06/2017  . Oxygen dependent [Z99.81] 06/06/2017  . Seasonal allergies [J30.2] 06/06/2017  . Pulmonary hypertension, primary ([place]) [I27.0] 06/06/2017  . CAD (coronary artery disease) [I25.10] 06/06/2017    Past Medical History Past Medical History:  Diagnosis Date  . Allergy   . Anemia 07/25/2017  . Anxiety   . Arthritis   . Atrial fibrillation ([place])   . Cataract   . COPD (chronic obstructive pulmonary disease) ([place])   . Depression   . Diabetes mellitus without complication ([place])   . Emphysema of lung ([place])   . Hyperlipidemia   . Hypertension   . Oxygen deficiency   . Sleep apnea   . Typical atrial flutter [facility])     Past Surgical History Past Surgical History:  Procedure Laterality Date  . A-FLUTTER ABLATION N/A 10/04/2017   Procedure: A-FLUTTER ABLATION;  Surgeon: Thompson Grayer, MD;  Location: [facility];  Service: Cardiovascular;  Laterality: N/A;  . ABDOMINAL HYSTERECTOMY     fibroids  . blood clot removal from base of brain  1990  . CARPAL TUNNEL RELEASE    . CATARACT EXTRACTION W/PHACO Left 06/16/2013   Procedure: CATARACT EXTRACTION PHACO AND INTRAOCULAR LENS PLACEMENT (IOC);  Surgeon: Tonny Branch, MD;  Location: [facility];  Service: Ophthalmology;  Laterality: Left;  CDE:  12.18  . CATARACT EXTRACTION W/PHACO Right 06/26/2013   Procedure: CATARACT EXTRACTION PHACO AND INTRAOCULAR LENS PLACEMENT (IOC);  Surgeon: Tonny Branch, MD;  Location: [facility];  Service: Ophthalmology;  Laterality: Right;  CDE:14.71  . CESAREAN SECTION    . CHOLECYSTECTOMY    . DILATION AND CURETTAGE OF UTERUS    . EYE SURGERY  12/2016  . FOOT NEUROMA SURGERY Right   . LOOP RECORDER INSERTION N/A 11/06/2017   Procedure: LOOP RECORDER INSERTION;  Surgeon: Thompson Grayer, MD;  Location: [facility];  Service: Cardiovascular;  Laterality: N/A;  . MOUTH SURGERY    . TONSILLECTOMY      Family History Family History  Problem Relation Age of Onset  . Emphysema  Mother   . Alcohol abuse Mother   . Arthritis Mother   . COPD Mother   . Depression Mother   . Hyperlipidemia Mother   . Hypertension Mother   . Heart attack Father   . Cancer Father        lung  . Heart disease Father   . Diabetes Brother   . COPD Brother   . Other Maternal Grandmother        swine flu 1919  . Colon cancer Neg Hx   . Colon polyps Neg Hx      Social History  reports that she quit smoking about 16 years ago. Her smoking use included cigarettes. She started smoking about 59 years ago. She smoked 1.50 packs per day. She has never used smokeless tobacco. She reports that she does not drink alcohol or use drugs.  Medications  Current Outpatient Medications:  .  acetaminophen (TYLENOL) 500 MG tablet, Take 1,000 mg by mouth every 6 (six) hours as needed (for pain.). , Disp: , Rfl:  .  apixaban (ELIQUIS) 5 MG TABS tablet, Take 1 tablet (5 mg total) by mouth 2 (two) times daily., Disp: 180 tablet, Rfl: 3 .  cholecalciferol (VITAMIN D) 1000 units tablet, Take 2,000 Units by mouth daily., Disp: , Rfl:  .  DULoxetine (CYMBALTA) 30 MG capsule, Take 30 mg by mouth daily., Disp: , Rfl:  .  Fluticasone-Salmeterol (ADVAIR DISKUS) 250-50 MCG/DOSE AEPB, Inhale 1 puff into the lungs 2 (two) times daily., Disp: , Rfl:  .  furosemide (LASIX) 20 MG tablet, Take 1 tablet (20 mg total) by mouth as needed., Disp: 30 tablet, Rfl: 1 .  glucose blood (ONETOUCH VERIO) test strip, Use as instructed, Disp: 100 each, Rfl: 0 .  guaiFENesin (MUCINEX) 600 MG 12 hr tablet, Take 600-1,200 mg by mouth 2 (two) times daily as needed (for cough or congestion.)., Disp: , Rfl:  .  metFORMIN (GLUCOPHAGE-XR) 500 MG 24 hr tablet, Take 2 tablets (1,000 mg total) by mouth daily with breakfast., Disp: 180 tablet, Rfl: 3 .  metoprolol succinate (TOPROL-XL) 50 MG 24 hr tablet, Take 50 mg by mouth 2 (two) times daily. Take with or immediately following a meal., Disp: , Rfl:  .  OXYGEN, Inhale into the lungs as  needed., Disp: , Rfl:  .  pravastatin (PRAVACHOL) 20 MG tablet, Take 1 tablet (20 mg total) daily by mouth. (Patient taking differently: Take 20 mg by mouth at bedtime. ), Disp: 90 tablet, Rfl: 3 .  PROAIR HFA 108 (90 Base) MCG/ACT inhaler, USE 1 TO 2 INHALATIONS EVERY 6 HOURS AS NEEDED FOR WHEEZING OR SHORTNESS OF BREATH, Disp: 8.5 g, Rfl: 1 .  tiotropium (SPIRIVA) 18 MCG inhalation capsule, Place 18 mcg into inhaler and inhale daily., Disp: , Rfl:   Allergies Other; Trazodone; Trazodone and nefazodone; Nickel; Adhesive [tape]; Bactrim [sulfamethoxazole-trimethoprim]; Penicillins; Ppd [tuberculin purified protein derivative]; and Sulfamethoxazole-trimethoprim  Review of Systems Review of Systems - Oncology ROS as per HPI except pt reports fatigue.    Physical Exam  Vitals Wt Readings from Last 3 Encounters:  03/07/18 227 lb 3.2 oz (103.1 kg)  01/14/18 241 lb 0.6 oz (109.3 kg)  12/28/17 244 lb (110.7 kg)   Temp Readings from Last 3 Encounters:  03/07/18 97.9 F (36.6 C) (Oral)  01/14/18 98.3 F (36.8 C) (Oral)  11/23/17 (!) 97.5 F (36.4 C) (Oral)   BP Readings from Last 3 Encounters:  03/07/18 127/61  01/14/18 134/74  12/28/17 130/72   Pulse Readings from Last 3 Encounters:  03/07/18 73  01/14/18 74  12/28/17 62   Constitutional: Well-developed, well-nourished, and in no distress.   HENT: Head: Normocephalic and atraumatic.  Mouth/Throat: No oropharyngeal exudate. Mucosa moist. Eyes: Pupils are equal, round, and reactive to light. Conjunctivae are normal. No scleral icterus.  Neck: Normal range of motion. Neck supple. No JVD present.  Cardiovascular: Normal rate, regular rhythm and normal heart sounds.  Exam reveals no gallop and no friction rub.   No murmur heard. Pulmonary/Chest: Effort normal and breath sounds normal. No respiratory distress. No wheezes.No rales.  Abdominal: Soft. Bowel sounds are normal. No distension. There is no tenderness. There is no guarding.   Musculoskeletal: No edema or tenderness.  Lymphadenopathy: No cervical, axillary or supraclavicular adenopathy.  Neurological: Alert and oriented to person, place, and time. No cranial nerve deficit.  Skin: Skin is warm and dry. No rash noted. No erythema. No pallor.  Psychiatric: Affect and judgment normal.   Labs Appointment on 03/07/2018  Component Date Value Ref Range Status  . WBC 03/07/2018 5.6  4.0 - 10.5 K/uL Final  . RBC 03/07/2018 4.66  3.87 - 5.11 MIL/uL Final  . Hemoglobin 03/07/2018 14.4  12.0 - 15.0 g/dL Final  . HCT 03/07/2018 45.6  36.0 - 46.0 % Final  . MCV 03/07/2018 97.9  78.0 - 100.0 fL Final  . MCH 03/07/2018 30.9  26.0 - 34.0 pg Final  . MCHC 03/07/2018 31.6  30.0 - 36.0 g/dL Final  . RDW 03/07/2018 13.3  11.5 - 15.5 % Final  . Platelets 03/07/2018 155  150 - 400 K/uL Final  . Neutrophils Relative % 03/07/2018 73  % Final  . Neutro Abs 03/07/2018 4.1  1.7 - 7.7 K/uL Final  . Lymphocytes Relative 03/07/2018 20  % Final  . Lymphs Abs 03/07/2018 1.1  0.7 - 4.0 K/uL Final  . Monocytes Relative 03/07/2018 6  % Final  . Monocytes Absolute 03/07/2018 0.4  0.1 - 1.0 K/uL Final  . Eosinophils Relative 03/07/2018 1  % Final  . Eosinophils Absolute 03/07/2018 0.1  0.0 - 0.7 K/uL Final  . Basophils Relative 03/07/2018 0  % Final  . Basophils Absolute 03/07/2018 0.0  0.0 - 0.1 K/uL Final   Performed at [facility], [address], [place], [place] [address]  . Sodium 03/07/2018 134* 135 - 145 mmol/L Final  . Potassium 03/07/2018 4.3  3.5 - 5.1 mmol/L Final  . Chloride 03/07/2018 99* 101 - 111 mmol/L Final  . CO2 03/07/2018 26  22 - 32 mmol/L Final  . Glucose, Bld 03/07/2018 492* 65 - 99 mg/dL Final  . BUN 03/07/2018 18  6 - 20 mg/dL Final  . Creatinine, Ser 03/07/2018 1.01* 0.44 - 1.00 mg/dL Final  . Calcium 03/07/2018 9.4  8.9 - 10.3 mg/dL Final  . GFR calc non Af Amer 03/07/2018 55* >60 mL/min Final  . GFR calc  Af Amer 03/07/2018 >60  >60 mL/min Final   Comment:  (NOTE) The eGFR has been calculated using the CKD EPI equation. This calculation has not been validated in all clinical situations. eGFR's persistently <60 mL/min signify possible Chronic Kidney Disease.   Georgiann Hahn gap 03/07/2018 9  5 - 15 Final   Performed at [facility], [address] [place], [place] [address]     Pathology Orders Placed This Encounter  Procedures  . CBC with Differential/Platelet    Standing Status:   Future    Standing Expiration Date:   03/08/2019  . Comprehensive metabolic panel    Standing Status:   Future    Standing Expiration Date:   03/08/2019  . Lactate dehydrogenase    Standing Status:   Future    Standing Expiration Date:   03/08/2019  . Ferritin    Standing Status:   Future    Standing Expiration Date:   03/08/2019  . CBC with Differential/Platelet    Standing Status:   Future    Standing Expiration Date:   03/07/2020  . Comprehensive metabolic panel    Standing Status:   Future    Standing Expiration Date:   03/07/2020  . Lactate dehydrogenase    Standing Status:   Future    Standing Expiration Date:   03/07/2020  . Ferritin    Standing Status:   Future    Standing Expiration Date:   03/07/2020       Zoila Shutter MD

## 2018-03-08 DIAGNOSIS — Z299 Encounter for prophylactic measures, unspecified: Secondary | ICD-10-CM | POA: Diagnosis not present

## 2018-03-08 DIAGNOSIS — I10 Essential (primary) hypertension: Secondary | ICD-10-CM | POA: Diagnosis not present

## 2018-03-08 DIAGNOSIS — F329 Major depressive disorder, single episode, unspecified: Secondary | ICD-10-CM | POA: Diagnosis not present

## 2018-03-08 DIAGNOSIS — E119 Type 2 diabetes mellitus without complications: Secondary | ICD-10-CM | POA: Diagnosis not present

## 2018-03-08 DIAGNOSIS — I4891 Unspecified atrial fibrillation: Secondary | ICD-10-CM | POA: Diagnosis not present

## 2018-03-08 DIAGNOSIS — Z6841 Body Mass Index (BMI) 40.0 and over, adult: Secondary | ICD-10-CM | POA: Diagnosis not present

## 2018-03-08 DIAGNOSIS — E785 Hyperlipidemia, unspecified: Secondary | ICD-10-CM | POA: Diagnosis not present

## 2018-03-11 ENCOUNTER — Ambulatory Visit (HOSPITAL_COMMUNITY)
Admission: RE | Admit: 2018-03-11 | Discharge: 2018-03-11 | Disposition: A | Discharge status: Home / Self Care | Payer: Medicare Other | Source: Ambulatory Visit | Attending: Family Medicine | Admitting: Family Medicine

## 2018-03-11 DIAGNOSIS — Z1231 Encounter for screening mammogram for malignant neoplasm of breast: Secondary | ICD-10-CM

## 2018-03-11 LAB — CUP PACEART REMOTE DEVICE CHECK
Date Time Interrogation Session: 20190522160928
Implantable Pulse Generator Implant Date: 20190212

## 2018-03-12 ENCOUNTER — Telehealth: Payer: Self-pay | Admitting: Cardiology

## 2018-03-12 ENCOUNTER — Encounter: Payer: Self-pay | Admitting: Gastroenterology

## 2018-03-12 NOTE — Telephone Encounter (Signed)
Spoke w/ pt and requested that she send a manual transmission b/c her home monitor has not updated in at least 14 days.   

## 2018-03-15 DIAGNOSIS — E1165 Type 2 diabetes mellitus with hyperglycemia: Secondary | ICD-10-CM | POA: Diagnosis not present

## 2018-03-15 DIAGNOSIS — I4891 Unspecified atrial fibrillation: Secondary | ICD-10-CM | POA: Diagnosis not present

## 2018-03-15 DIAGNOSIS — Z299 Encounter for prophylactic measures, unspecified: Secondary | ICD-10-CM | POA: Diagnosis not present

## 2018-03-15 DIAGNOSIS — Z6841 Body Mass Index (BMI) 40.0 and over, adult: Secondary | ICD-10-CM | POA: Diagnosis not present

## 2018-03-15 DIAGNOSIS — J449 Chronic obstructive pulmonary disease, unspecified: Secondary | ICD-10-CM | POA: Diagnosis not present

## 2018-03-15 DIAGNOSIS — I10 Essential (primary) hypertension: Secondary | ICD-10-CM | POA: Diagnosis not present

## 2018-03-18 ENCOUNTER — Ambulatory Visit (INDEPENDENT_AMBULATORY_CARE_PROVIDER_SITE_OTHER): Payer: Medicare Other | Admitting: *Deleted

## 2018-03-18 DIAGNOSIS — I480 Paroxysmal atrial fibrillation: Secondary | ICD-10-CM | POA: Diagnosis not present

## 2018-03-18 NOTE — Progress Notes (Signed)
Carelink Summary Report / Loop Recorder 

## 2018-03-25 ENCOUNTER — Telehealth: Payer: Self-pay | Admitting: Cardiology

## 2018-03-25 NOTE — Telephone Encounter (Signed)
Spoke w/ pt and requested that she send a manual transmission b/c her home monitor has not updated in at least 14 days.   

## 2018-04-04 ENCOUNTER — Other Ambulatory Visit: Payer: Self-pay | Admitting: Cardiovascular Disease

## 2018-04-08 DIAGNOSIS — R918 Other nonspecific abnormal finding of lung field: Secondary | ICD-10-CM | POA: Diagnosis not present

## 2018-04-10 ENCOUNTER — Telehealth: Payer: Self-pay

## 2018-04-10 NOTE — Telephone Encounter (Signed)
Attempted to confirm remote transmission with pt. No answer and was unable to leave a message.   

## 2018-04-16 ENCOUNTER — Ambulatory Visit: Payer: Medicare Other | Admitting: Family Medicine

## 2018-04-22 ENCOUNTER — Ambulatory Visit (INDEPENDENT_AMBULATORY_CARE_PROVIDER_SITE_OTHER): Payer: Medicare Other | Admitting: *Deleted

## 2018-04-22 DIAGNOSIS — I480 Paroxysmal atrial fibrillation: Secondary | ICD-10-CM | POA: Diagnosis not present

## 2018-04-22 NOTE — Progress Notes (Signed)
Carelink Summary Report / Loop Recorder 

## 2018-04-26 DIAGNOSIS — Z6841 Body Mass Index (BMI) 40.0 and over, adult: Secondary | ICD-10-CM | POA: Diagnosis not present

## 2018-04-26 DIAGNOSIS — Z299 Encounter for prophylactic measures, unspecified: Secondary | ICD-10-CM | POA: Diagnosis not present

## 2018-04-26 DIAGNOSIS — I10 Essential (primary) hypertension: Secondary | ICD-10-CM | POA: Diagnosis not present

## 2018-04-26 DIAGNOSIS — E1165 Type 2 diabetes mellitus with hyperglycemia: Secondary | ICD-10-CM | POA: Diagnosis not present

## 2018-04-26 DIAGNOSIS — I4891 Unspecified atrial fibrillation: Secondary | ICD-10-CM | POA: Diagnosis not present

## 2018-04-26 DIAGNOSIS — J449 Chronic obstructive pulmonary disease, unspecified: Secondary | ICD-10-CM | POA: Diagnosis not present

## 2018-04-26 DIAGNOSIS — Z87891 Personal history of nicotine dependence: Secondary | ICD-10-CM | POA: Diagnosis not present

## 2018-05-02 LAB — CUP PACEART REMOTE DEVICE CHECK
Date Time Interrogation Session: 20190624160712
Implantable Pulse Generator Implant Date: 20190212

## 2018-05-07 ENCOUNTER — Telehealth: Payer: Self-pay | Admitting: Cardiology

## 2018-05-07 DIAGNOSIS — H35031 Hypertensive retinopathy, right eye: Secondary | ICD-10-CM | POA: Diagnosis not present

## 2018-05-07 DIAGNOSIS — I10 Essential (primary) hypertension: Secondary | ICD-10-CM | POA: Diagnosis not present

## 2018-05-07 DIAGNOSIS — H35373 Puckering of macula, bilateral: Secondary | ICD-10-CM | POA: Diagnosis not present

## 2018-05-07 DIAGNOSIS — H524 Presbyopia: Secondary | ICD-10-CM | POA: Diagnosis not present

## 2018-05-07 DIAGNOSIS — Z961 Presence of intraocular lens: Secondary | ICD-10-CM | POA: Diagnosis not present

## 2018-05-07 DIAGNOSIS — H52222 Regular astigmatism, left eye: Secondary | ICD-10-CM | POA: Diagnosis not present

## 2018-05-07 DIAGNOSIS — H26492 Other secondary cataract, left eye: Secondary | ICD-10-CM | POA: Diagnosis not present

## 2018-05-07 DIAGNOSIS — H179 Unspecified corneal scar and opacity: Secondary | ICD-10-CM | POA: Diagnosis not present

## 2018-05-07 DIAGNOSIS — H5202 Hypermetropia, left eye: Secondary | ICD-10-CM | POA: Diagnosis not present

## 2018-05-07 DIAGNOSIS — E119 Type 2 diabetes mellitus without complications: Secondary | ICD-10-CM | POA: Diagnosis not present

## 2018-05-07 NOTE — Telephone Encounter (Signed)
LMOVM requesting that pt send manual transmission b/c home monitor has not updated in at least 14 days.    

## 2018-05-08 DIAGNOSIS — B351 Tinea unguium: Secondary | ICD-10-CM | POA: Diagnosis not present

## 2018-05-08 DIAGNOSIS — E1140 Type 2 diabetes mellitus with diabetic neuropathy, unspecified: Secondary | ICD-10-CM | POA: Diagnosis not present

## 2018-05-08 DIAGNOSIS — M79671 Pain in right foot: Secondary | ICD-10-CM | POA: Diagnosis not present

## 2018-05-08 DIAGNOSIS — M792 Neuralgia and neuritis, unspecified: Secondary | ICD-10-CM | POA: Diagnosis not present

## 2018-05-10 DIAGNOSIS — E1165 Type 2 diabetes mellitus with hyperglycemia: Secondary | ICD-10-CM | POA: Diagnosis not present

## 2018-05-10 DIAGNOSIS — J449 Chronic obstructive pulmonary disease, unspecified: Secondary | ICD-10-CM | POA: Diagnosis not present

## 2018-05-10 DIAGNOSIS — Z299 Encounter for prophylactic measures, unspecified: Secondary | ICD-10-CM | POA: Diagnosis not present

## 2018-05-10 DIAGNOSIS — Z6841 Body Mass Index (BMI) 40.0 and over, adult: Secondary | ICD-10-CM | POA: Diagnosis not present

## 2018-05-10 DIAGNOSIS — I4891 Unspecified atrial fibrillation: Secondary | ICD-10-CM | POA: Diagnosis not present

## 2018-05-10 DIAGNOSIS — I10 Essential (primary) hypertension: Secondary | ICD-10-CM | POA: Diagnosis not present

## 2018-05-10 DIAGNOSIS — E785 Hyperlipidemia, unspecified: Secondary | ICD-10-CM | POA: Diagnosis not present

## 2018-05-23 ENCOUNTER — Ambulatory Visit (INDEPENDENT_AMBULATORY_CARE_PROVIDER_SITE_OTHER): Payer: Medicare Other | Admitting: *Deleted

## 2018-05-23 DIAGNOSIS — I4891 Unspecified atrial fibrillation: Secondary | ICD-10-CM | POA: Diagnosis not present

## 2018-05-24 ENCOUNTER — Telehealth: Payer: Self-pay

## 2018-05-24 NOTE — Telephone Encounter (Signed)
LMOVM requesting that pt send manual transmission b/c home monitor has not updated in at least 14 days.   Spoke w/ pt and requested that she send a manual transmission b/c her home monitor has not updated in at least 14 days.   

## 2018-05-24 NOTE — Progress Notes (Signed)
Carelink Summary Report / Loop Recorder 

## 2018-06-03 LAB — CUP PACEART REMOTE DEVICE CHECK
Date Time Interrogation Session: 20190727164024
MDC IDC PG IMPLANT DT: 20190212

## 2018-06-10 ENCOUNTER — Telehealth: Payer: Self-pay | Admitting: Cardiology

## 2018-06-10 NOTE — Telephone Encounter (Signed)
Spoke w/ pt and requested that she send a manual transmission b/c her home monitor has not updated in at least 14 days.   

## 2018-06-11 ENCOUNTER — Telehealth: Payer: Self-pay

## 2018-06-11 ENCOUNTER — Other Ambulatory Visit: Payer: Self-pay

## 2018-06-11 ENCOUNTER — Encounter: Payer: Self-pay | Admitting: Gastroenterology

## 2018-06-11 ENCOUNTER — Ambulatory Visit (INDEPENDENT_AMBULATORY_CARE_PROVIDER_SITE_OTHER): Payer: Medicare Other | Admitting: Gastroenterology

## 2018-06-11 VITALS — BP 129/79 | HR 79 | Temp 97.1°F | Ht 63.0 in | Wt 235.2 lb

## 2018-06-11 DIAGNOSIS — D509 Iron deficiency anemia, unspecified: Secondary | ICD-10-CM | POA: Diagnosis not present

## 2018-06-11 DIAGNOSIS — I2510 Atherosclerotic heart disease of native coronary artery without angina pectoris: Secondary | ICD-10-CM | POA: Diagnosis not present

## 2018-06-11 MED ORDER — NA SULFATE-K SULFATE-MG SULF 17.5-3.13-1.6 GM/177ML PO SOLN: 1.0000 | ORAL | 0 refills | Status: DC

## 2018-06-11 NOTE — Telephone Encounter (Signed)
Opened in error

## 2018-06-11 NOTE — Patient Instructions (Addendum)
We have scheduled a colonoscopy and possible upper endoscopy in the near future.  Please stop Eliquis 48 hours prior to the procedure  On the morning of the procedure, do not take metformin or glipizide.  Further recommendations to follow!  It was a pleasure to see you today. I strive to create trusting relationships with patients to provide genuine, compassionate, and quality care. I value your feedback. If you receive a survey regarding your visit,  I greatly appreciate you taking time to fill this out.   Annitta Needs, PhD, ANP-BC [facility]

## 2018-06-11 NOTE — Progress Notes (Signed)
Referring Provider: Caren Macadam, MD Primary Care Physician:  [name], MD  Primary GI: Dr. Oneida Alar  Primary Cardiologist: Dr. Rayann Heman  Chief Complaint  Patient presents with  . Anemia  . Consult    TCS    HPI:   Linda [name] is a 72 y.o. female presenting today with a history of IDA, previously seen in Nov 2018 as a new consult with recommendations for TCS/EGD. New onset IDA noted at that time. Has had 3 iron infusions. Followed by Hematology. Hgb now improved to 14 range but ferritin dropping again to low normal. On Eliquis.    No overt GI bleeding. No abdominal pain, N/V, dysphagia, unexplained weight loss or lack of appetite. Underwent aflutter ablation Jan 2019. Loop recorder insertion Feb 2019.    Colonoscopy in remote past in [place], reportedly told she had one polyp.    Past Medical History:  Diagnosis Date  . Allergy   . Anemia 07/25/2017  . Anxiety   . Arthritis   . Atrial fibrillation ([place])   . Cataract   . COPD (chronic obstructive pulmonary disease) ([place])   . Depression   . Diabetes mellitus without complication ([place])   . Emphysema of lung ([place])   . Hyperlipidemia   . Hypertension   . Oxygen deficiency   . Sleep apnea   . Typical atrial flutter [facility])     Past Surgical History:  Procedure Laterality Date  . A-FLUTTER ABLATION N/A 10/04/2017   Procedure: A-FLUTTER ABLATION;  Surgeon: Thompson Grayer, MD;  Location: [place] [facility];  Service: Cardiovascular;  Laterality: N/A;  . ABDOMINAL HYSTERECTOMY     fibroids  . blood clot removal from base of brain  1990  . CARPAL TUNNEL RELEASE    . CATARACT EXTRACTION W/PHACO Left 06/16/2013   Procedure: CATARACT EXTRACTION PHACO AND INTRAOCULAR LENS PLACEMENT (IOC);  Surgeon: Tonny Branch, MD;  [facility]: [facility];  Service: Ophthalmology;  Laterality: Left;  CDE:  12.18  . CATARACT EXTRACTION W/PHACO Right 06/26/2013   Procedure: CATARACT EXTRACTION PHACO AND INTRAOCULAR LENS PLACEMENT (IOC);  Surgeon:  Tonny Branch, MD;  Location: [facility];  Service: Ophthalmology;  Laterality: Right;  CDE:14.71  . CESAREAN SECTION    . CHOLECYSTECTOMY    . DILATION AND CURETTAGE OF UTERUS    . EYE SURGERY  12/2016  . FOOT NEUROMA SURGERY Right   . LOOP RECORDER INSERTION N/A 11/06/2017   Procedure: LOOP RECORDER INSERTION;  Surgeon: Thompson Grayer, MD;  Location: [place] [facility];  Service: Cardiovascular;  Laterality: N/A;  . MOUTH SURGERY    . TONSILLECTOMY      Current Outpatient Medications  Medication Sig Dispense Refill  . acetaminophen (TYLENOL) 500 MG tablet Take 1,000 mg by mouth every 6 (six) hours as needed (for pain.).     Marland Kitchen apixaban (ELIQUIS) 5 MG TABS tablet Take 1 tablet (5 mg total) by mouth 2 (two) times daily. 180 tablet 3  . cholecalciferol (VITAMIN D) 1000 units tablet Take 2,000 Units by mouth daily.    . Cyanocobalamin (VITAMIN B-12 PO) Take 1 tablet by mouth daily.    . DULoxetine (CYMBALTA) 30 MG capsule Take 30 mg by mouth daily.    . Fluticasone-Salmeterol (ADVAIR DISKUS) 250-50 MCG/DOSE AEPB Inhale 1 puff into the lungs 2 (two) times daily.    . furosemide (LASIX) 20 MG tablet Take 1 tablet (20 mg total) by mouth as needed. 30 tablet 1  . glipiZIDE (GLUCOTROL) 5 MG tablet Take  1 tablet by mouth daily.    Marland Kitchen glucose blood (ONETOUCH VERIO) test strip Use as instructed 100 each 0  . guaiFENesin (MUCINEX) 600 MG 12 hr tablet Take 600-1,200 mg by mouth 2 (two) times daily as needed (for cough or congestion.).    Marland Kitchen metFORMIN (GLUCOPHAGE-XR) 500 MG 24 hr tablet Take 2 tablets (1,000 mg total) by mouth daily with breakfast. 180 tablet 3  . metoprolol succinate (TOPROL-XL) 50 MG 24 hr tablet Take 50 mg by mouth 2 (two) times daily. Take with or immediately following a meal.    . OXYGEN Inhale into the lungs at bedtime.     . pravastatin (PRAVACHOL) 20 MG tablet Take 1 tablet (20 mg total) daily by mouth. (Patient taking differently: Take 20 mg by mouth at bedtime. ) 90 tablet 3  . PROAIR  HFA 108 (90 Base) MCG/ACT inhaler USE 1 TO 2 INHALATIONS EVERY 6 HOURS AS NEEDED FOR WHEEZING OR SHORTNESS OF BREATH 8.5 g 1  . tiotropium (SPIRIVA) 18 MCG inhalation capsule Place 18 mcg into inhaler and inhale daily.     No current facility-administered medications for this visit.     Allergies as of 06/11/2018 - Review Complete 06/11/2018  Allergen Reaction Noted  . Other Swelling 06/09/2013  . Trazodone Hives and Other (See Comments) 01/10/2017  . Trazodone and nefazodone Hives and Other (See Comments) 01/10/2017  . Nickel Other (See Comments) 10/04/2017  . Adhesive [tape] Itching 06/09/2013  . Bactrim [sulfamethoxazole-trimethoprim] Itching and Rash 06/09/2013  . Penicillins Itching, Rash, and Other (See Comments) 06/09/2013  . Ppd [tuberculin purified protein derivative] Swelling and Other (See Comments) 06/09/2013  . Sulfamethoxazole-trimethoprim Itching and Rash 06/09/2013    Family History  Problem Relation Age of Onset  . Emphysema Mother   . Alcohol abuse Mother   . Arthritis Mother   . COPD Mother   . Depression Mother   . Hyperlipidemia Mother   . Hypertension Mother   . Heart attack Father   . Cancer Father        lung  . Heart disease Father   . Diabetes Brother   . COPD Brother   . Other Maternal Grandmother        swine flu 1919  . Colon cancer Neg Hx   . Colon polyps Neg Hx     Social History   Socioeconomic History  . Marital status: Widowed    Spouse name: Not on file  . Number of children: 1  . Years of education: 80  . Highest education level: Not on file  Occupational History  . Occupation: retired    Comment: catering, [employer]  . Financial resource strain: Not on file  . Food insecurity:    Worry: Not on file    Inability: Not on file  . Transportation needs:    Medical: Not on file    Non-medical: Not on file  Tobacco Use  . Smoking status: Former Smoker    Packs/day: 1.50    Types: Cigarettes    Start date:  09/25/1958    Last attempt to quit: 06/06/2001    Years since quitting: 17.0  . Smokeless tobacco: Never Used  Substance and Sexual Activity  . Alcohol use: No  . Drug use: No  . Sexual activity: Not Currently  Lifestyle  . Physical activity:    Days per week: Not on file    Minutes per session: Not on file  . Stress: Not on file  Relationships  .  Social connections:    Talks on phone: Not on file    Gets together: Not on file    Attends religious service: Not on file    Active member of club or organization: Not on file    Attends meetings of clubs or organizations: Not on file    Relationship status: Not on file  Other Topics Concern  . Not on file  Social History Narrative   Lives alone   Son lives in [place]   TV, reads, puzzles    Review of Systems: Gen: Denies fever, chills, anorexia. Denies fatigue, weakness, weight loss.  CV: Denies chest pain, palpitations, syncope, peripheral edema, and claudication. Resp: Denies dyspnea at rest, cough, wheezing, coughing up blood, and pleurisy. GI: see HPI  Derm: Denies rash, itching, dry skin Psych: Denies depression, anxiety, memory loss, confusion. No homicidal or suicidal ideation.  Heme: Denies bruising, bleeding, and enlarged lymph nodes.  Physical Exam: BP 129/79   Pulse 79   Temp (!) 97.1 F (36.2 C) (Oral)   Ht 5\' 3"  (1.6 m)   Wt 235 lb 3.2 oz (106.7 kg)   BMI 41.66 kg/m  General:   Alert and oriented. No distress noted. Pleasant and cooperative.  Head:  Normocephalic and atraumatic. Eyes:  Conjuctiva clear without scleral icterus. Mouth:  Oral mucosa pink and moist.  Cardiac: Clear to auscultation bilaterally Lungs: S1 S2 present with murmur  Abdomen:  +BS, soft, non-tender and non-distended. No rebound or guarding. No HSM or masses noted. Msk:  Symmetrical without gross deformities. Normal posture. Extremities:  Without edema. Neurologic:  Alert and  oriented x4 Psych:  Alert and cooperative. Normal  mood and affect.

## 2018-06-12 NOTE — Telephone Encounter (Signed)
Called and informed pt of pre-op appt 08/06/18 at 11:00am. Letter mailed.

## 2018-06-17 NOTE — Assessment & Plan Note (Signed)
72 year old female with new onset IDA last year, requiring IV iron. Hgb has improved to normal now but ferritin dropping again to low normal. No overt GI bleeding or concerning upper/lower GI symptoms. Last colonoscopy in remote past in [place], reportedly with polyp. No procedure notes available.  Proceed with colonoscopy +/- EGD with Dr. Oneida Alar in the near future. The risks, benefits, and alternatives have been discussed in detail with the patient. They state understanding and desire to proceed.  Propofol due to polypharmacy Hold Eliquis X 48 hours prior

## 2018-06-18 NOTE — Progress Notes (Signed)
CC'D TO PCP °

## 2018-06-19 DIAGNOSIS — I10 Essential (primary) hypertension: Secondary | ICD-10-CM | POA: Diagnosis not present

## 2018-06-19 DIAGNOSIS — Z23 Encounter for immunization: Secondary | ICD-10-CM | POA: Diagnosis not present

## 2018-06-19 DIAGNOSIS — E785 Hyperlipidemia, unspecified: Secondary | ICD-10-CM | POA: Diagnosis not present

## 2018-06-19 DIAGNOSIS — Z299 Encounter for prophylactic measures, unspecified: Secondary | ICD-10-CM | POA: Diagnosis not present

## 2018-06-19 DIAGNOSIS — Z6841 Body Mass Index (BMI) 40.0 and over, adult: Secondary | ICD-10-CM | POA: Diagnosis not present

## 2018-06-19 DIAGNOSIS — E1165 Type 2 diabetes mellitus with hyperglycemia: Secondary | ICD-10-CM | POA: Diagnosis not present

## 2018-06-19 LAB — CUP PACEART REMOTE DEVICE CHECK
Implantable Pulse Generator Implant Date: 20190212
MDC IDC SESS DTM: 20190829174115

## 2018-06-24 DIAGNOSIS — H26492 Other secondary cataract, left eye: Secondary | ICD-10-CM | POA: Diagnosis not present

## 2018-06-25 ENCOUNTER — Ambulatory Visit (INDEPENDENT_AMBULATORY_CARE_PROVIDER_SITE_OTHER): Payer: Medicare Other | Admitting: *Deleted

## 2018-06-25 DIAGNOSIS — R0689 Other abnormalities of breathing: Secondary | ICD-10-CM | POA: Diagnosis not present

## 2018-06-25 DIAGNOSIS — J449 Chronic obstructive pulmonary disease, unspecified: Secondary | ICD-10-CM | POA: Diagnosis not present

## 2018-06-25 DIAGNOSIS — I255 Ischemic cardiomyopathy: Secondary | ICD-10-CM | POA: Diagnosis not present

## 2018-06-25 DIAGNOSIS — G4733 Obstructive sleep apnea (adult) (pediatric): Secondary | ICD-10-CM | POA: Diagnosis not present

## 2018-06-25 DIAGNOSIS — Z87891 Personal history of nicotine dependence: Secondary | ICD-10-CM | POA: Diagnosis not present

## 2018-06-25 DIAGNOSIS — I4891 Unspecified atrial fibrillation: Secondary | ICD-10-CM

## 2018-06-25 NOTE — Progress Notes (Addendum)
Carelink Summary Report / Loop Recorder 

## 2018-06-26 LAB — CUP PACEART REMOTE DEVICE CHECK
Date Time Interrogation Session: 20191001174031
Implantable Pulse Generator Implant Date: 20190212

## 2018-06-28 ENCOUNTER — Encounter: Payer: Self-pay | Admitting: Internal Medicine

## 2018-06-28 ENCOUNTER — Ambulatory Visit (INDEPENDENT_AMBULATORY_CARE_PROVIDER_SITE_OTHER): Payer: Medicare Other | Admitting: Internal Medicine

## 2018-06-28 ENCOUNTER — Encounter: Payer: Self-pay | Admitting: *Deleted

## 2018-06-28 VITALS — BP 130/70 | HR 73 | Ht 63.0 in | Wt 240.0 lb

## 2018-06-28 DIAGNOSIS — I255 Ischemic cardiomyopathy: Secondary | ICD-10-CM

## 2018-06-28 DIAGNOSIS — E669 Obesity, unspecified: Secondary | ICD-10-CM

## 2018-06-28 DIAGNOSIS — I480 Paroxysmal atrial fibrillation: Secondary | ICD-10-CM

## 2018-06-28 DIAGNOSIS — G4733 Obstructive sleep apnea (adult) (pediatric): Secondary | ICD-10-CM

## 2018-06-28 DIAGNOSIS — R0602 Shortness of breath: Secondary | ICD-10-CM | POA: Diagnosis not present

## 2018-06-28 NOTE — Progress Notes (Signed)
PCP: [name], MD Primary Cardiologist: Dr Bronson Ing Primary EP: Dr Coral Ceo Linda [name] is a 72 y.o. female who presents today for routine electrophysiology followup.  Since last being seen in our clinic, the patient reports doing reasonably well.  She is working on lifestyle modification.  A1C has gone from 14 to 6.2!  Unfortunately, her weight has gone up... She is not very active. Does not use CPAP.  Does not exercise.  + sob with activity.  + breathless at times.  Today, she denies symptoms of palpitations, chest pain,  lower extremity edema, dizziness, presyncope, or syncope.  The patient is otherwise without complaint today.   Past Medical History:  Diagnosis Date  . Allergy   . Anemia 07/25/2017  . Anxiety   . Arthritis   . Atrial fibrillation ([place])   . Cataract   . COPD (chronic obstructive pulmonary disease) ([place])   . Depression   . Diabetes mellitus without complication ([place])   . Emphysema of lung ([place])   . Hyperlipidemia   . Hypertension   . Oxygen deficiency   . Sleep apnea   . Typical atrial flutter [facility])    Past Surgical History:  Procedure Laterality Date  . A-FLUTTER ABLATION N/A 10/04/2017   Procedure: A-FLUTTER ABLATION;  Surgeon: Thompson Grayer, MD;  Location: [place] [facility];  Service: Cardiovascular;  Laterality: N/A;  . ABDOMINAL HYSTERECTOMY     fibroids  . blood clot removal from base of brain  1990  . CARPAL TUNNEL RELEASE    . CATARACT EXTRACTION W/PHACO Left 06/16/2013   Procedure: CATARACT EXTRACTION PHACO AND INTRAOCULAR LENS PLACEMENT (IOC);  Surgeon: Tonny Branch, MD;  Location: [facility];  Service: Ophthalmology;  Laterality: Left;  CDE:  12.18  . CATARACT EXTRACTION W/PHACO Right 06/26/2013   Procedure: CATARACT EXTRACTION PHACO AND INTRAOCULAR LENS PLACEMENT (IOC);  Surgeon: Tonny Branch, MD;  Location: [facility];  Service: Ophthalmology;  Laterality: Right;  CDE:14.71  . CESAREAN SECTION    . CHOLECYSTECTOMY    . DILATION AND CURETTAGE OF  UTERUS    . EYE SURGERY  12/2016  . FOOT NEUROMA SURGERY Right   . LOOP RECORDER INSERTION N/A 11/06/2017   Procedure: LOOP RECORDER INSERTION;  Surgeon: Thompson Grayer, MD;  Location: [place] [facility];  Service: Cardiovascular;  Laterality: N/A;  . MOUTH SURGERY    . TONSILLECTOMY      ROS- all systems are reviewed and negatives except as per HPI above  Current Outpatient Medications  Medication Sig Dispense Refill  . acetaminophen (TYLENOL) 500 MG tablet Take 1,000 mg by mouth every 6 (six) hours as needed (for pain.).     Marland Kitchen apixaban (ELIQUIS) 5 MG TABS tablet Take 1 tablet (5 mg total) by mouth 2 (two) times daily. 180 tablet 3  . cholecalciferol (VITAMIN D) 1000 units tablet Take 2,000 Units by mouth daily.    . Cyanocobalamin (VITAMIN B-12 PO) Take 1 tablet by mouth daily.    . DULoxetine (CYMBALTA) 30 MG capsule Take 30 mg by mouth daily.    . Fluticasone-Salmeterol (ADVAIR DISKUS) 250-50 MCG/DOSE AEPB Inhale 1 puff into the lungs 2 (two) times daily.    . furosemide (LASIX) 20 MG tablet Take 1 tablet (20 mg total) by mouth as needed. 30 tablet 1  . glipiZIDE (GLUCOTROL) 5 MG tablet Take 1 tablet by mouth daily.    Marland Kitchen glucose blood (ONETOUCH VERIO) test strip Use as instructed 100 each 0  . guaiFENesin ([place])  600 MG 12 hr tablet Take 600-1,200 mg by mouth daily.     . metFORMIN (GLUCOPHAGE) 500 MG tablet Take 500 mg by mouth daily with breakfast.    . metoprolol succinate (TOPROL-XL) 50 MG 24 hr tablet Take 50 mg by mouth 2 (two) times daily. Take with or immediately following a meal.    . OXYGEN Inhale into the lungs at bedtime.     . pravastatin (PRAVACHOL) 20 MG tablet Take 1 tablet (20 mg total) daily by mouth. (Patient taking differently: Take 20 mg by mouth at bedtime. ) 90 tablet 3  . PROAIR HFA 108 (90 Base) MCG/ACT inhaler USE 1 TO 2 INHALATIONS EVERY 6 HOURS AS NEEDED FOR WHEEZING OR SHORTNESS OF BREATH 8.5 g 1  . tiotropium (SPIRIVA) 18 MCG inhalation capsule Place 18  mcg into inhaler and inhale daily.     No current facility-administered medications for this visit.     Physical Exam: Vitals:   06/28/18 0827  BP: 130/70  Pulse: 73  SpO2: 93%  Weight: 240 lb (108.9 kg)  Height: 5\' 3"  (1.6 m)    GEN- The patient is overweight appearing, alert and oriented x 3 today.   Head- normocephalic, atraumatic Eyes-  Sclera clear, conjunctiva pink Ears- hearing intact Oropharynx- clear Lungs- Clear to ausculation bilaterally, normal work of breathing Heart- Regular rate and rhythm, no murmurs, rubs or gallops, PMI not laterally displaced GI- soft, NT, ND, + BS Extremities- no clubbing, cyanosis, or edema  Wt Readings from Last 3 Encounters:  06/28/18 240 lb (108.9 kg)  06/11/18 235 lb 3.2 oz (106.7 kg)  03/07/18 227 lb 3.2 oz (103.1 kg)    Assessment and Plan:  1. Atrial flutter/ afib S/p CTI ablation for atrial flutter ILR to monitor for AF She has had afib detected.  Continue anticoagulation AF burden is 2 %.  AF has markedly improved over the past 6 months with treatment of her diabetes!  2. Obesity Body mass index is 42.51 kg/m. Wt Readings from Last 3 Encounters:  06/28/18 240 lb (108.9 kg)  06/11/18 235 lb 3.2 oz (106.7 kg)  03/07/18 227 lb 3.2 oz (103.1 kg)   Lifestyle modification encouraged I will refer to Dr Hassell Done at [facility] for bariatric surgery consideration  3. OSA Not compliant with BiPAP I will refer to Dr Carlena Sax for additional sleep management  4. SOB Likely due to obesity And inactivity Echo/ lexiscan ordered given diabetes, sedentary lifestyle.  If low risk, medical management is advised   carelink Return in a year  Thompson Grayer MD, [facility] 06/28/2018 9:16 AM

## 2018-06-28 NOTE — Patient Instructions (Signed)
Medication Instructions:  Continue all current medications.  Labwork: none  Testing/Procedures:  Your physician has requested that you have an echocardiogram. Echocardiography is a painless test that uses sound waves to create images of your heart. It provides your doctor with information about the size and shape of your heart and how well your heart's chambers and valves are working. This procedure takes approximately one hour. There are no restrictions for this procedure.  Your physician has requested that you have a lexiscan myoview. For further information please visit [email or web address]. Please follow instruction sheet, as given.  Office will contact with results via phone or letter.    Follow-Up: Your physician wants you to follow up in:  1 year.  You will receive a reminder letter in the mail one-two months in advance.  If you don't receive a letter, please call our office to schedule the follow up appointment.  Any Other Special Instructions Will Be Listed Below (If Applicable).  Continue monthly summary reports.   You have been referred to:  Dr. Hassell Done at [facility] for weight loss evaluation.  You have been referred to:  Dr. Carlena Sax for sleep apnea evaluation.   If you need a refill on your cardiac medications before your next appointment, please call your pharmacy.

## 2018-06-30 LAB — CUP PACEART INCLINIC DEVICE CHECK
MDC IDC PG IMPLANT DT: 20190212
MDC IDC SESS DTM: 20191004123000

## 2018-07-01 NOTE — Addendum Note (Signed)
Addended by: Laurine Blazer on: 07/01/2018 05:33 PM   Modules accepted: Orders

## 2018-07-09 ENCOUNTER — Telehealth: Payer: Self-pay | Admitting: Internal Medicine

## 2018-07-09 NOTE — Telephone Encounter (Signed)
Pre-cert Verification for the following procedure   Lexiscan & Echo scheduled for 07-22-18 at [facility]

## 2018-07-16 DIAGNOSIS — Z1331 Encounter for screening for depression: Secondary | ICD-10-CM | POA: Diagnosis not present

## 2018-07-16 DIAGNOSIS — Z1339 Encounter for screening examination for other mental health and behavioral disorders: Secondary | ICD-10-CM | POA: Diagnosis not present

## 2018-07-16 DIAGNOSIS — Z6841 Body Mass Index (BMI) 40.0 and over, adult: Secondary | ICD-10-CM | POA: Diagnosis not present

## 2018-07-16 DIAGNOSIS — Z79899 Other long term (current) drug therapy: Secondary | ICD-10-CM | POA: Diagnosis not present

## 2018-07-16 DIAGNOSIS — Z0000 Encounter for general adult medical examination without abnormal findings: Secondary | ICD-10-CM | POA: Diagnosis not present

## 2018-07-16 DIAGNOSIS — Z299 Encounter for prophylactic measures, unspecified: Secondary | ICD-10-CM | POA: Diagnosis not present

## 2018-07-16 DIAGNOSIS — Z7189 Other specified counseling: Secondary | ICD-10-CM | POA: Diagnosis not present

## 2018-07-16 DIAGNOSIS — I10 Essential (primary) hypertension: Secondary | ICD-10-CM | POA: Diagnosis not present

## 2018-07-16 DIAGNOSIS — F329 Major depressive disorder, single episode, unspecified: Secondary | ICD-10-CM | POA: Diagnosis not present

## 2018-07-16 DIAGNOSIS — I4891 Unspecified atrial fibrillation: Secondary | ICD-10-CM | POA: Diagnosis not present

## 2018-07-16 DIAGNOSIS — E785 Hyperlipidemia, unspecified: Secondary | ICD-10-CM | POA: Diagnosis not present

## 2018-07-22 ENCOUNTER — Encounter (HOSPITAL_COMMUNITY): Payer: Self-pay

## 2018-07-22 ENCOUNTER — Ambulatory Visit (HOSPITAL_COMMUNITY)
Admission: RE | Admit: 2018-07-22 | Discharge: 2018-07-22 | Disposition: A | Discharge status: Home / Self Care | Payer: Medicare Other | Source: Ambulatory Visit | Attending: Internal Medicine | Admitting: Internal Medicine

## 2018-07-22 ENCOUNTER — Encounter (HOSPITAL_COMMUNITY)
Admission: RE | Admit: 2018-07-22 | Discharge: 2018-07-22 | Disposition: A | Discharge status: Home / Self Care | Payer: Medicare Other | Source: Ambulatory Visit | Attending: Internal Medicine | Admitting: Internal Medicine

## 2018-07-22 ENCOUNTER — Encounter (HOSPITAL_BASED_OUTPATIENT_CLINIC_OR_DEPARTMENT_OTHER)
Admission: RE | Admit: 2018-07-22 | Discharge: 2018-07-22 | Disposition: A | Discharge status: Home / Self Care | Payer: Medicare Other | Source: Ambulatory Visit | Attending: Internal Medicine | Admitting: Internal Medicine

## 2018-07-22 DIAGNOSIS — I4891 Unspecified atrial fibrillation: Secondary | ICD-10-CM | POA: Diagnosis not present

## 2018-07-22 DIAGNOSIS — I4892 Unspecified atrial flutter: Secondary | ICD-10-CM | POA: Diagnosis not present

## 2018-07-22 DIAGNOSIS — R0602 Shortness of breath: Secondary | ICD-10-CM

## 2018-07-22 DIAGNOSIS — E119 Type 2 diabetes mellitus without complications: Secondary | ICD-10-CM | POA: Diagnosis not present

## 2018-07-22 DIAGNOSIS — I119 Hypertensive heart disease without heart failure: Secondary | ICD-10-CM | POA: Insufficient documentation

## 2018-07-22 DIAGNOSIS — I2510 Atherosclerotic heart disease of native coronary artery without angina pectoris: Secondary | ICD-10-CM | POA: Insufficient documentation

## 2018-07-22 LAB — NM MYOCAR MULTI W/SPECT W/WALL MOTION / EF
CHL CUP NUCLEAR SRS: 2
CHL CUP RESTING HR STRESS: 64 {beats}/min
LV sys vol: 22 mL
LVDIAVOL: 70 mL (ref 46–106)
NUC STRESS TID: 1.25
Peak HR: 82 {beats}/min
RATE: 0.42
SDS: 4
SSS: 6

## 2018-07-22 MED ORDER — TECHNETIUM TC 99M TETROFOSMIN IV KIT
10.0000 | PACK | Freq: Once | INTRAVENOUS | Status: AC | PRN
  Administered 2018-07-22: 9.5 via INTRAVENOUS

## 2018-07-22 MED ORDER — SODIUM CHLORIDE 0.9% FLUSH
INTRAVENOUS | Status: AC
  Administered 2018-07-22: 10 mL via INTRAVENOUS
  Filled 2018-07-22: qty 10

## 2018-07-22 MED ORDER — TECHNETIUM TC 99M TETROFOSMIN IV KIT
30.0000 | PACK | Freq: Once | INTRAVENOUS | Status: AC | PRN
  Administered 2018-07-22: 31 via INTRAVENOUS

## 2018-07-22 MED ORDER — REGADENOSON 0.4 MG/5ML IV SOLN
INTRAVENOUS | Status: AC
  Administered 2018-07-22: 0.4 mg via INTRAVENOUS
  Filled 2018-07-22: qty 5

## 2018-07-22 NOTE — Progress Notes (Signed)
*  PRELIMINARY RESULTS* Echocardiogram 2D Echocardiogram has been performed.  I was going to use definity on this patient, however, her echo is ordered without definity. I called the [facility] office to see if they could change it. Per Juliann Pulse, RN, from [place] she didn't have definity on her last echo([place]) so this one should be fine without it.  Leavy Cella 07/22/2018, 11:54 AM

## 2018-07-24 DIAGNOSIS — M79671 Pain in right foot: Secondary | ICD-10-CM | POA: Diagnosis not present

## 2018-07-24 DIAGNOSIS — B351 Tinea unguium: Secondary | ICD-10-CM | POA: Diagnosis not present

## 2018-07-24 DIAGNOSIS — M792 Neuralgia and neuritis, unspecified: Secondary | ICD-10-CM | POA: Diagnosis not present

## 2018-07-24 DIAGNOSIS — E1140 Type 2 diabetes mellitus with diabetic neuropathy, unspecified: Secondary | ICD-10-CM | POA: Diagnosis not present

## 2018-07-26 ENCOUNTER — Telehealth: Payer: Self-pay | Admitting: *Deleted

## 2018-07-26 NOTE — Telephone Encounter (Signed)
Notes recorded by Laurine Blazer, LPN on 78/05/3809 at 1:75 PM EDT Patient notified and verbalized understanding.  Copy to pmd.

## 2018-07-26 NOTE — Telephone Encounter (Signed)
STRESS TEST -  Notes recorded by Thompson Grayer, MD on 07/24/2018 at 10:17 PM EDT Results reviewed. Sonia Baller, please inform pt of low risk result. No further workup planned.  ECHO -   Notes recorded by Thompson Grayer, MD on 07/24/2018 at 10:14 PM EDT Results reviewed. Sonia Baller, please inform pt of result. I will route to primary care also.

## 2018-07-29 ENCOUNTER — Ambulatory Visit (INDEPENDENT_AMBULATORY_CARE_PROVIDER_SITE_OTHER): Payer: Medicare Other | Admitting: *Deleted

## 2018-07-29 ENCOUNTER — Other Ambulatory Visit (HOSPITAL_COMMUNITY): Payer: Medicare Other

## 2018-07-29 DIAGNOSIS — I480 Paroxysmal atrial fibrillation: Secondary | ICD-10-CM

## 2018-07-29 NOTE — Progress Notes (Signed)
Carelink Summary Report / Loop Recorder 

## 2018-07-31 NOTE — Patient Instructions (Signed)
Linda [name]  07/31/2018     @PREFPERIOPPHARMACY @   Your procedure is scheduled on  08/13/2018.  Report to [facility] at  800   A.M.  Call this number if you have problems the morning of surgery:  [phone]   Remember:  Follow the diet and prep instructions given to you by Dr Nona Dell office.                     Take these medicines the morning of surgery with A SIP OF WATER  Cymbalta, metoprolol. Use your inhalers before you come.    Do not wear jewelry, make-up or nail polish.  Do not wear lotions, powders, or perfumes, or deodorant.  Do not shave 48 hours prior to surgery.  Men may shave face and neck.  Do not bring valuables to the hospital.  [facility] [place] is not responsible for any belongings or valuables.  Contacts, dentures or bridgework may not be worn into surgery.  Leave your suitcase in the car.  After surgery it may be brought to your room.  For patients admitted to the hospital, discharge time will be determined by your treatment team.  Patients discharged the day of surgery will not be allowed to drive home.   Name and phone number of your driver:   family Special instructions:  Follow any instructions given to you concerning your eliquis by Dr Nona Dell office.  Please read over the following fact sheets that you were given. Anesthesia Post-op Instructions and Care and Recovery After Surgery       Esophagogastroduodenoscopy Esophagogastroduodenoscopy (EGD) is a procedure to examine the lining of the esophagus, stomach, and first part of the small intestine (duodenum). This procedure is done to check for problems such as inflammation, bleeding, ulcers, or growths. During this procedure, a long, flexible, lighted tube with a camera attached (endoscope) is inserted down the throat. Tell a health care provider about:  Any allergies you have.  All medicines you are taking, including vitamins, herbs, eye drops, creams, and  over-the-counter medicines.  Any problems you or family members have had with anesthetic medicines.  Any blood disorders you have.  Any surgeries you have had.  Any medical conditions you have.  Whether you are pregnant or may be pregnant. What are the risks? Generally, this is a safe procedure. However, problems may occur, including:  Infection.  Bleeding.  A tear (perforation) in the esophagus, stomach, or duodenum.  Trouble breathing.  Excessive sweating.  Spasms of the larynx.  A slowed heartbeat.  Low blood pressure.  What happens before the procedure?  Follow instructions from your health care provider about eating or drinking restrictions.  Ask your health care provider about: ? Changing or stopping your regular medicines. This is especially important if you are taking diabetes medicines or blood thinners. ? Taking medicines such as aspirin and ibuprofen. These medicines can thin your blood. Do not take these medicines before your procedure if your health care provider instructs you not to.  Plan to have someone take you home after the procedure.  If you wear dentures, be ready to remove them before the procedure. What happens during the procedure?  To reduce your risk of infection, your health care team will wash or sanitize their hands.  An IV tube will be put in a vein in your hand or arm. You will get medicines and fluids through this  tube.  You will be given one or more of the following: ? A medicine to help you relax (sedative). ? A medicine to numb the area (local anesthetic). This medicine may be sprayed into your throat. It will make you feel more comfortable and keep you from gagging or coughing during the procedure. ? A medicine for pain.  A mouth guard may be placed in your mouth to protect your teeth and to keep you from biting on the endoscope.  You will be asked to lie on your left side.  The endoscope will be lowered down your throat into  your esophagus, stomach, and duodenum.  Air will be put into the endoscope. This will help your health care provider see better.  The lining of your esophagus, stomach, and duodenum will be examined.  Your health care provider may: ? Take a tissue sample so it can be looked at in a lab (biopsy). ? Remove growths. ? Remove objects (foreign bodies) that are stuck. ? Treat any bleeding with medicines or other devices that stop tissue from bleeding. ? Widen (dilate) or stretch narrowed areas of your esophagus and stomach.  The endoscope will be taken out. The procedure may vary among health care providers and hospitals. What happens after the procedure?  Your blood pressure, heart rate, breathing rate, and blood oxygen level will be monitored often until the medicines you were given have worn off.  Do not eat or drink anything until the numbing medicine has worn off and your gag reflex has returned. This information is not intended to replace advice given to you by your health care provider. Make sure you discuss any questions you have with your health care provider. Document Released: 01/12/2005 Document Revised: 02/17/2016 Document Reviewed: 08/05/2015 Elsevier Interactive Patient Education  2018 [employer]. Esophagogastroduodenoscopy, Care After Refer to this sheet in the next few weeks. These instructions provide you with information about caring for yourself after your procedure. Your health care provider may also give you more specific instructions. Your treatment has been planned according to current medical practices, but problems sometimes occur. Call your health care provider if you have any problems or questions after your procedure. What can I expect after the procedure? After the procedure, it is common to have:  A sore throat.  Nausea.  Bloating.  Dizziness.  Fatigue.  Follow these instructions at home:  Do not eat or drink anything until the numbing medicine  (local anesthetic) has worn off and your gag reflex has returned. You will know that the local anesthetic has worn off when you can swallow comfortably.  Do not drive for 24 hours if you received a medicine to help you relax (sedative).  If your health care provider took a tissue sample for testing during the procedure, make sure to get your test results. This is your responsibility. Ask your health care provider or the department performing the test when your results will be ready.  Keep all follow-up visits as told by your health care provider. This is important. Contact a health care provider if:  You cannot stop coughing.  You are not urinating.  You are urinating less than usual. Get help right away if:  You have trouble swallowing.  You cannot eat or drink.  You have throat or chest pain that gets worse.  You are dizzy or light-headed.  You faint.  You have nausea or vomiting.  You have chills.  You have a fever.  You have severe abdominal pain.  You have black, tarry, or bloody stools. This information is not intended to replace advice given to you by your health care provider. Make sure you discuss any questions you have with your health care provider. Document Released: 08/28/2012 Document Revised: 02/17/2016 Document Reviewed: 08/05/2015 Elsevier Interactive Patient Education  2018 [employer].  Colonoscopy, Adult A colonoscopy is an exam to look at the large intestine. It is done to check for problems, such as:  Lumps (tumors).  Growths (polyps).  Swelling (inflammation).  Bleeding.  What happens before the procedure? Eating and drinking Follow instructions from your doctor about eating and drinking. These instructions may include:  A few days before the procedure - follow a low-fiber diet. ? Avoid nuts. ? Avoid seeds. ? Avoid dried fruit. ? Avoid raw fruits. ? Avoid vegetables.  1-3 days before the procedure - follow a clear liquid diet.  Avoid liquids that have red or purple dye. Drink only clear liquids, such as: ? Clear broth or bouillon. ? Black coffee or tea. ? Clear juice. ? Clear soft drinks or sports drinks. ? Gelatin dessert. ? Popsicles.  On the day of the procedure - do not eat or drink anything during the 2 hours before the procedure.  Bowel prep If you were prescribed an oral bowel prep:  Take it as told by your doctor. Starting the day before your procedure, you will need to drink a lot of liquid. The liquid will cause you to poop (have bowel movements) until your poop is almost clear or light [place].  If your skin or butt gets irritated from diarrhea, you may: ? Wipe the area with wipes that have medicine in them, such as adult wet wipes with aloe and vitamin E. ? Put something on your skin that soothes the area, such as petroleum jelly.  If you throw up (vomit) while drinking the bowel prep, take a break for up to 60 minutes. Then begin the bowel prep again. If you keep throwing up and you cannot take the bowel prep without throwing up, call your doctor.  General instructions  Ask your doctor about changing or stopping your normal medicines. This is important if you take diabetes medicines or blood thinners.  Plan to have someone take you home from the hospital or clinic. What happens during the procedure?  An IV tube may be put into one of your veins.  You will be given medicine to help you relax (sedative).  To reduce your risk of infection: ? Your doctors will wash their hands. ? Your anal area will be washed with soap.  You will be asked to lie on your side with your knees bent.  Your doctor will get a long, thin, flexible tube ready. The tube will have a camera and a light on the end.  The tube will be put into your anus.  The tube will be gently put into your large intestine.  Air will be delivered into your large intestine to keep it open. You may feel some pressure or cramping.  The  camera will be used to take photos.  A small tissue sample may be removed from your body to be looked at under a microscope (biopsy). If any possible problems are found, the tissue will be sent to a lab for testing.  If small growths are found, your doctor may remove them and have them checked for cancer.  The tube that was put into your anus will be slowly removed. The procedure may vary among  doctors and hospitals. What happens after the procedure?  Your doctor will check on you often until the medicines you were given have worn off.  Do not drive for 24 hours after the procedure.  You may have a small amount of blood in your poop.  You may pass gas.  You may have mild cramps or bloating in your belly (abdomen).  It is up to you to get the results of your procedure. Ask your doctor, or the department performing the procedure, when your results will be ready. This information is not intended to replace advice given to you by your health care provider. Make sure you discuss any questions you have with your health care provider. Document Released: 10/14/2010 Document Revised: 07/12/2016 Document Reviewed: 11/23/2015 Elsevier Interactive Patient Education  2017 [employer]  Colonoscopy, Adult, Care After This sheet gives you information about how to care for yourself after your procedure. Your health care provider may also give you more specific instructions. If you have problems or questions, contact your health care provider. What can I expect after the procedure? After the procedure, it is common to have:  A small amount of blood in your stool for 24 hours after the procedure.  Some gas.  Mild abdominal cramping or bloating.  Follow these instructions at home: General instructions   For the first 24 hours after the procedure: ? Do not drive or use machinery. ? Do not sign important documents. ? Do not drink alcohol. ? Do your regular daily activities at a slower pace  than normal. ? Eat soft, easy-to-digest foods. ? Rest often.  Take over-the-counter or prescription medicines only as told by your health care provider.  It is up to you to get the results of your procedure. Ask your health care provider, or the department performing the procedure, when your results will be ready. Relieving cramping and bloating  Try walking around when you have cramps or feel bloated.  Apply heat to your abdomen as told by your health care provider. Use a heat source that your health care provider recommends, such as a moist heat pack or a heating pad. ? Place a towel between your skin and the heat source. ? Leave the heat on for 20-30 minutes. ? Remove the heat if your skin turns bright red. This is especially important if you are unable to feel pain, heat, or cold. You may have a greater risk of getting burned. Eating and drinking  Drink enough fluid to keep your urine clear or pale yellow.  Resume your normal diet as instructed by your health care provider. Avoid heavy or fried foods that are hard to digest.  Avoid drinking alcohol for as long as instructed by your health care provider. Contact a health care provider if:  You have blood in your stool 2-3 days after the procedure. Get help right away if:  You have more than a small spotting of blood in your stool.  You pass large blood clots in your stool.  Your abdomen is swollen.  You have nausea or vomiting.  You have a fever.  You have increasing abdominal pain that is not relieved with medicine. This information is not intended to replace advice given to you by your health care provider. Make sure you discuss any questions you have with your health care provider. Document Released: 04/25/2004 Document Revised: 06/05/2016 Document Reviewed: 11/23/2015 Elsevier Interactive Patient Education  2018 [employer] Anesthesia is a term that refers to  techniques, procedures, and  medicines that help a person stay safe and comfortable during a medical procedure. Monitored anesthesia care, or sedation, is one type of anesthesia. Your anesthesia specialist may recommend sedation if you will be having a procedure that does not require you to be unconscious, such as:  Cataract surgery.  A dental procedure.  A biopsy.  A colonoscopy.  During the procedure, you may receive a medicine to help you relax (sedative). There are three levels of sedation:  Mild sedation. At this level, you may feel awake and relaxed. You will be able to follow directions.  Moderate sedation. At this level, you will be sleepy. You may not remember the procedure.  Deep sedation. At this level, you will be asleep. You will not remember the procedure.  The more medicine you are given, the deeper your level of sedation will be. Depending on how you respond to the procedure, the anesthesia specialist may change your level of sedation or the type of anesthesia to fit your needs. An anesthesia specialist will monitor you closely during the procedure. Let your health care provider know about:  Any allergies you have.  All medicines you are taking, including vitamins, herbs, eye drops, creams, and over-the-counter medicines.  Any use of steroids (by mouth or as a cream).  Any problems you or family members have had with sedatives and anesthetic medicines.  Any blood disorders you have.  Any surgeries you have had.  Any medical conditions you have, such as sleep apnea.  Whether you are pregnant or may be pregnant.  Any use of cigarettes, alcohol, or street drugs. What are the risks? Generally, this is a safe procedure. However, problems may occur, including:  Getting too much medicine (oversedation).  Nausea.  Allergic reaction to medicines.  Trouble breathing. If this happens, a breathing tube may be used to help with breathing. It will be removed when you are awake and breathing on  your own.  Heart trouble.  Lung trouble.  Before the procedure Staying hydrated Follow instructions from your health care provider about hydration, which may include:  Up to 2 hours before the procedure - you may continue to drink clear liquids, such as water, clear fruit juice, black coffee, and plain tea.  Eating and drinking restrictions Follow instructions from your health care provider about eating and drinking, which may include:  8 hours before the procedure - stop eating heavy meals or foods such as meat, fried foods, or fatty foods.  6 hours before the procedure - stop eating light meals or foods, such as toast or cereal.  6 hours before the procedure - stop drinking milk or drinks that contain milk.  2 hours before the procedure - stop drinking clear liquids.  Medicines Ask your health care provider about:  Changing or stopping your regular medicines. This is especially important if you are taking diabetes medicines or blood thinners.  Taking medicines such as aspirin and ibuprofen. These medicines can thin your blood. Do not take these medicines before your procedure if your health care provider instructs you not to.  Tests and exams  You will have a physical exam.  You may have blood tests done to show: ? How well your kidneys and liver are working. ? How well your blood can clot.  General instructions  Plan to have someone take you home from the hospital or clinic.  If you will be going home right after the procedure, plan to have someone with you for 24  hours.  What happens during the procedure?  Your blood pressure, heart rate, breathing, level of pain and overall condition will be monitored.  An IV tube will be inserted into one of your veins.  Your anesthesia specialist will give you medicines as needed to keep you comfortable during the procedure. This may mean changing the level of sedation.  The procedure will be performed. After the  procedure  Your blood pressure, heart rate, breathing rate, and blood oxygen level will be monitored until the medicines you were given have worn off.  Do not drive for 24 hours if you received a sedative.  You may: ? Feel sleepy, clumsy, or nauseous. ? Feel forgetful about what happened after the procedure. ? Have a sore throat if you had a breathing tube during the procedure. ? Vomit. This information is not intended to replace advice given to you by your health care provider. Make sure you discuss any questions you have with your health care provider. Document Released: 06/07/2005 Document Revised: 02/18/2016 Document Reviewed: 01/02/2016 Elsevier Interactive Patient Education  2018 [place], Care After These instructions provide you with information about caring for yourself after your procedure. Your health care provider may also give you more specific instructions. Your treatment has been planned according to current medical practices, but problems sometimes occur. Call your health care provider if you have any problems or questions after your procedure. What can I expect after the procedure? After your procedure, it is common to:  Feel sleepy for several hours.  Feel clumsy and have poor balance for several hours.  Feel forgetful about what happened after the procedure.  Have poor judgment for several hours.  Feel nauseous or vomit.  Have a sore throat if you had a breathing tube during the procedure.  Follow these instructions at home: For at least 24 hours after the procedure:   Do not: ? Participate in activities in which you could fall or become injured. ? Drive. ? Use heavy machinery. ? Drink alcohol. ? Take sleeping pills or medicines that cause drowsiness. ? Make important decisions or sign legal documents. ? Take care of children on your own.  Rest. Eating and drinking  Follow the diet that is recommended by your health  care provider.  If you vomit, drink water, juice, or soup when you can drink without vomiting.  Make sure you have little or no nausea before eating solid foods. General instructions  Have a responsible adult stay with you until you are awake and alert.  Take over-the-counter and prescription medicines only as told by your health care provider.  If you smoke, do not smoke without supervision.  Keep all follow-up visits as told by your health care provider. This is important. Contact a health care provider if:  You keep feeling nauseous or you keep vomiting.  You feel light-headed.  You develop a rash.  You have a fever. Get help right away if:  You have trouble breathing. This information is not intended to replace advice given to you by your health care provider. Make sure you discuss any questions you have with your health care provider. Document Released: 01/02/2016 Document Revised: 05/03/2016 Document Reviewed: 01/02/2016 Elsevier Interactive Patient Education  [employer].

## 2018-08-01 DIAGNOSIS — H35372 Puckering of macula, left eye: Secondary | ICD-10-CM | POA: Diagnosis not present

## 2018-08-02 DIAGNOSIS — Z6841 Body Mass Index (BMI) 40.0 and over, adult: Secondary | ICD-10-CM | POA: Diagnosis not present

## 2018-08-02 DIAGNOSIS — Z9049 Acquired absence of other specified parts of digestive tract: Secondary | ICD-10-CM | POA: Diagnosis not present

## 2018-08-02 DIAGNOSIS — M6281 Muscle weakness (generalized): Secondary | ICD-10-CM | POA: Diagnosis not present

## 2018-08-02 DIAGNOSIS — I4891 Unspecified atrial fibrillation: Secondary | ICD-10-CM | POA: Diagnosis not present

## 2018-08-02 DIAGNOSIS — F419 Anxiety disorder, unspecified: Secondary | ICD-10-CM | POA: Diagnosis not present

## 2018-08-02 DIAGNOSIS — Z7902 Long term (current) use of antithrombotics/antiplatelets: Secondary | ICD-10-CM | POA: Diagnosis not present

## 2018-08-02 DIAGNOSIS — I10 Essential (primary) hypertension: Secondary | ICD-10-CM | POA: Diagnosis not present

## 2018-08-02 DIAGNOSIS — Z79899 Other long term (current) drug therapy: Secondary | ICD-10-CM | POA: Diagnosis not present

## 2018-08-02 DIAGNOSIS — J449 Chronic obstructive pulmonary disease, unspecified: Secondary | ICD-10-CM | POA: Diagnosis not present

## 2018-08-02 DIAGNOSIS — R079 Chest pain, unspecified: Secondary | ICD-10-CM | POA: Diagnosis not present

## 2018-08-02 DIAGNOSIS — Z7951 Long term (current) use of inhaled steroids: Secondary | ICD-10-CM | POA: Diagnosis not present

## 2018-08-02 DIAGNOSIS — Z880 Allergy status to penicillin: Secondary | ICD-10-CM | POA: Diagnosis not present

## 2018-08-02 DIAGNOSIS — E7800 Pure hypercholesterolemia, unspecified: Secondary | ICD-10-CM | POA: Diagnosis not present

## 2018-08-02 DIAGNOSIS — Z7984 Long term (current) use of oral hypoglycemic drugs: Secondary | ICD-10-CM | POA: Diagnosis not present

## 2018-08-02 DIAGNOSIS — Z90710 Acquired absence of both cervix and uterus: Secondary | ICD-10-CM | POA: Diagnosis not present

## 2018-08-02 DIAGNOSIS — Z801 Family history of malignant neoplasm of trachea, bronchus and lung: Secondary | ICD-10-CM | POA: Diagnosis not present

## 2018-08-02 DIAGNOSIS — R0602 Shortness of breath: Secondary | ICD-10-CM | POA: Diagnosis not present

## 2018-08-02 DIAGNOSIS — I700 Atherosclerosis of aorta: Secondary | ICD-10-CM | POA: Diagnosis not present

## 2018-08-02 DIAGNOSIS — Z8249 Family history of ischemic heart disease and other diseases of the circulatory system: Secondary | ICD-10-CM | POA: Diagnosis not present

## 2018-08-02 DIAGNOSIS — Z87891 Personal history of nicotine dependence: Secondary | ICD-10-CM | POA: Diagnosis not present

## 2018-08-02 DIAGNOSIS — E1165 Type 2 diabetes mellitus with hyperglycemia: Secondary | ICD-10-CM | POA: Diagnosis not present

## 2018-08-03 DIAGNOSIS — I480 Paroxysmal atrial fibrillation: Secondary | ICD-10-CM | POA: Diagnosis not present

## 2018-08-03 DIAGNOSIS — E1165 Type 2 diabetes mellitus with hyperglycemia: Secondary | ICD-10-CM | POA: Diagnosis not present

## 2018-08-03 DIAGNOSIS — R079 Chest pain, unspecified: Secondary | ICD-10-CM | POA: Diagnosis not present

## 2018-08-04 DIAGNOSIS — R5381 Other malaise: Secondary | ICD-10-CM | POA: Diagnosis not present

## 2018-08-04 DIAGNOSIS — I4891 Unspecified atrial fibrillation: Secondary | ICD-10-CM | POA: Diagnosis not present

## 2018-08-04 DIAGNOSIS — R079 Chest pain, unspecified: Secondary | ICD-10-CM | POA: Diagnosis not present

## 2018-08-04 DIAGNOSIS — E1165 Type 2 diabetes mellitus with hyperglycemia: Secondary | ICD-10-CM | POA: Diagnosis not present

## 2018-08-05 ENCOUNTER — Telehealth: Payer: Self-pay | Admitting: Gastroenterology

## 2018-08-05 NOTE — Telephone Encounter (Signed)
Called pt, she has been in the hospital with heart problems. OV scheduled for 11/01/17 to update H&P and reschedule procedure. Endo scheduler aware.  Manuela Schwartz, please put pt on cancellation list.

## 2018-08-05 NOTE — Telephone Encounter (Signed)
Pt on wait list

## 2018-08-05 NOTE — Telephone Encounter (Signed)
Pt called this morning saying she needed to reschedule her procedure with [facility] on 08/13/18. She has just gotten out of the hospital. [phone]

## 2018-08-06 ENCOUNTER — Encounter (HOSPITAL_COMMUNITY)
Admission: RE | Admit: 2018-08-06 | Discharge: 2018-08-06 | Disposition: A | Discharge status: Home / Self Care | Payer: Medicare Other | Source: Ambulatory Visit | Attending: Gastroenterology | Admitting: Gastroenterology

## 2018-08-06 ENCOUNTER — Encounter (HOSPITAL_COMMUNITY): Payer: Self-pay

## 2018-08-09 DIAGNOSIS — Z299 Encounter for prophylactic measures, unspecified: Secondary | ICD-10-CM | POA: Diagnosis not present

## 2018-08-09 DIAGNOSIS — J449 Chronic obstructive pulmonary disease, unspecified: Secondary | ICD-10-CM | POA: Diagnosis not present

## 2018-08-09 DIAGNOSIS — I10 Essential (primary) hypertension: Secondary | ICD-10-CM | POA: Diagnosis not present

## 2018-08-09 DIAGNOSIS — I4891 Unspecified atrial fibrillation: Secondary | ICD-10-CM | POA: Diagnosis not present

## 2018-08-09 DIAGNOSIS — R079 Chest pain, unspecified: Secondary | ICD-10-CM | POA: Diagnosis not present

## 2018-08-09 DIAGNOSIS — E1165 Type 2 diabetes mellitus with hyperglycemia: Secondary | ICD-10-CM | POA: Diagnosis not present

## 2018-08-09 DIAGNOSIS — Z6841 Body Mass Index (BMI) 40.0 and over, adult: Secondary | ICD-10-CM | POA: Diagnosis not present

## 2018-08-13 ENCOUNTER — Ambulatory Visit (HOSPITAL_COMMUNITY): Admit: 2018-08-13 | Payer: Medicare Other | Admitting: Gastroenterology

## 2018-08-13 ENCOUNTER — Encounter (HOSPITAL_COMMUNITY): Payer: Self-pay

## 2018-08-13 SURGERY — COLONOSCOPY WITH PROPOFOL
Anesthesia: Monitor Anesthesia Care

## 2018-08-30 ENCOUNTER — Ambulatory Visit (INDEPENDENT_AMBULATORY_CARE_PROVIDER_SITE_OTHER): Payer: Medicare Other

## 2018-08-30 ENCOUNTER — Telehealth (HOSPITAL_COMMUNITY): Payer: Self-pay | Admitting: *Deleted

## 2018-08-30 ENCOUNTER — Other Ambulatory Visit (HOSPITAL_COMMUNITY): Payer: Self-pay | Admitting: Internal Medicine

## 2018-08-30 ENCOUNTER — Inpatient Hospital Stay (HOSPITAL_COMMUNITY): Payer: Medicare Other | Attending: Hematology

## 2018-08-30 DIAGNOSIS — D500 Iron deficiency anemia secondary to blood loss (chronic): Secondary | ICD-10-CM | POA: Diagnosis not present

## 2018-08-30 DIAGNOSIS — I10 Essential (primary) hypertension: Secondary | ICD-10-CM | POA: Insufficient documentation

## 2018-08-30 DIAGNOSIS — E1169 Type 2 diabetes mellitus with other specified complication: Secondary | ICD-10-CM | POA: Insufficient documentation

## 2018-08-30 DIAGNOSIS — I480 Paroxysmal atrial fibrillation: Secondary | ICD-10-CM

## 2018-08-30 LAB — CBC WITH DIFFERENTIAL/PLATELET
Abs Immature Granulocytes: 0.01 10*3/uL (ref 0.00–0.07)
BASOS ABS: 0 10*3/uL (ref 0.0–0.1)
BASOS PCT: 0 %
EOS ABS: 0.2 10*3/uL (ref 0.0–0.5)
Eosinophils Relative: 2 %
HEMATOCRIT: 35.2 % — AB (ref 36.0–46.0)
Hemoglobin: 9.9 g/dL — ABNORMAL LOW (ref 12.0–15.0)
IMMATURE GRANULOCYTES: 0 %
LYMPHS ABS: 1.2 10*3/uL (ref 0.7–4.0)
Lymphocytes Relative: 20 %
MCH: 26 pg (ref 26.0–34.0)
MCHC: 28.1 g/dL — ABNORMAL LOW (ref 30.0–36.0)
MCV: 92.4 fL (ref 80.0–100.0)
MONOS PCT: 6 %
Monocytes Absolute: 0.4 10*3/uL (ref 0.1–1.0)
NEUTROS ABS: 4.4 10*3/uL (ref 1.7–7.7)
NEUTROS PCT: 72 %
NRBC: 0 % (ref 0.0–0.2)
PLATELETS: 165 10*3/uL (ref 150–400)
RBC: 3.81 MIL/uL — ABNORMAL LOW (ref 3.87–5.11)
RDW: 15.5 % (ref 11.5–15.5)
WBC: 6.2 10*3/uL (ref 4.0–10.5)

## 2018-08-30 LAB — COMPREHENSIVE METABOLIC PANEL
ALBUMIN: 3.7 g/dL (ref 3.5–5.0)
ALT: 14 U/L (ref 0–44)
AST: 25 U/L (ref 15–41)
Alkaline Phosphatase: 75 U/L (ref 38–126)
Anion gap: 9 (ref 5–15)
BUN: 16 mg/dL (ref 8–23)
CHLORIDE: 103 mmol/L (ref 98–111)
CO2: 27 mmol/L (ref 22–32)
CREATININE: 0.88 mg/dL (ref 0.44–1.00)
Calcium: 9.5 mg/dL (ref 8.9–10.3)
GFR calc Af Amer: >60 mL/min (ref >60)
GFR calc non Af Amer: >60 mL/min (ref >60)
GLUCOSE: 196 mg/dL — AB (ref 70–99)
POTASSIUM: 4 mmol/L (ref 3.5–5.1)
Sodium: 139 mmol/L (ref 135–145)
Total Bilirubin: 1.5 mg/dL — ABNORMAL HIGH (ref 0.3–1.2)
Total Protein: 7.2 g/dL (ref 6.5–8.1)

## 2018-08-30 LAB — FERRITIN: Ferritin: 8 ng/mL — ABNORMAL LOW (ref 11–307)

## 2018-08-30 LAB — LACTATE DEHYDROGENASE: LDH: 109 U/L (ref 98–192)

## 2018-09-02 NOTE — Progress Notes (Signed)
Carelink Summary Report / Loop Recorder 

## 2018-09-03 ENCOUNTER — Other Ambulatory Visit: Payer: Self-pay

## 2018-09-03 ENCOUNTER — Encounter (HOSPITAL_COMMUNITY): Payer: Self-pay | Admitting: Internal Medicine

## 2018-09-03 ENCOUNTER — Inpatient Hospital Stay (HOSPITAL_COMMUNITY): Payer: Medicare Other

## 2018-09-03 ENCOUNTER — Inpatient Hospital Stay (HOSPITAL_BASED_OUTPATIENT_CLINIC_OR_DEPARTMENT_OTHER): Payer: Medicare Other | Admitting: Internal Medicine

## 2018-09-03 VITALS — BP 137/58 | HR 71 | Temp 98.2°F | Resp 18 | Wt 227.0 lb

## 2018-09-03 VITALS — BP 136/66 | HR 69 | Temp 98.0°F | Resp 18

## 2018-09-03 DIAGNOSIS — D500 Iron deficiency anemia secondary to blood loss (chronic): Secondary | ICD-10-CM | POA: Diagnosis not present

## 2018-09-03 DIAGNOSIS — J449 Chronic obstructive pulmonary disease, unspecified: Secondary | ICD-10-CM

## 2018-09-03 DIAGNOSIS — E119 Type 2 diabetes mellitus without complications: Secondary | ICD-10-CM | POA: Diagnosis not present

## 2018-09-03 DIAGNOSIS — I10 Essential (primary) hypertension: Secondary | ICD-10-CM | POA: Diagnosis not present

## 2018-09-03 DIAGNOSIS — E1169 Type 2 diabetes mellitus with other specified complication: Secondary | ICD-10-CM | POA: Diagnosis not present

## 2018-09-03 MED ORDER — SODIUM CHLORIDE 0.9 % IV SOLN
510.0000 mg | Freq: Once | INTRAVENOUS | Status: AC
  Administered 2018-09-03: 510 mg via INTRAVENOUS
  Filled 2018-09-03: qty 17

## 2018-09-03 MED ORDER — SODIUM CHLORIDE 0.9 % IV SOLN
Freq: Once | INTRAVENOUS | Status: AC
  Administered 2018-09-03: 14:00:00 via INTRAVENOUS

## 2018-09-03 NOTE — Progress Notes (Signed)
Patient tolerated iron infusion with no complaints voiced.  Peripheral IV with good blood return noted before and after infusion.  No bruising or swelling noted at site.  Band aid applied.  VSs with discharge and left ambulatory with no s/s of distress noted.

## 2018-09-03 NOTE — Progress Notes (Signed)
Diagnosis Iron deficiency anemia due to chronic blood loss - Plan: CBC with Differential/Platelet, Comprehensive metabolic panel, Lactate dehydrogenase, Ferritin  Staging Cancer Staging No matching staging information was found for the patient.  Assessment and Plan:  1.  IDA.  Pt was last treated with IV iron in 11/2017.  Labs done 08/30/2018 reviewed and showed WBC 6.2 HB 9.9 plts 165,000.  Chemistries WNL with K+ 4 Cr 0.88 and normal LFTs.  Ferritin is decreased at 8.  Pt is again referred to GI and she reports she has an appointment in February 2020.  Pt will be treated with Feraheme 510 mg IV D1 and D8 and will RTC in 09/2018 for follow-up and labs. Follow-up with GI encouraged due to recurrent IDA.    2.  Fatigue.  Will determine if HB improves after IV iron.   3.  HTN.  BP is 136/66.  Follow-up with PCP.    4.  DM. BS is improved at 196.  Follow-up with PCP.    25 minutes spent with more than 50% spent in counseling and coordination of care.    Current Status:  Pt is seen today for follow-up.  She is here for evaluation prior to IV iron.   She has not been seen by GI but reports she has an appointment in 10/2018.     Problem List Patient Active Problem List   Diagnosis Date Noted  . Atrial flutter ([place]) [I48.92] 10/31/2017  . Bartholin's gland cyst [N75.0] 10/15/2017  . Major depression, recurrent, chronic ([place]) [F33.9] 10/15/2017  . IDA (iron deficiency anemia) [D50.9] 08/22/2017  . COPD with hypoxia ([place]) [J44.9, R09.02] 06/06/2017  . Type 2 diabetes mellitus with other specified complication ([place]) [T01.60] 06/06/2017  . Hypertension [I10] 06/06/2017  . HLD (hyperlipidemia) [E78.5] 06/06/2017  . Atrial fibrillation ([place]) [I48.91] 06/06/2017  . Morbid obesity ([place]) [E66.01] 06/06/2017  . Current use of long term anticoagulation [Z79.01] 06/06/2017  . OSA on CPAP [G47.33, Z99.89] 06/06/2017  . Oxygen dependent [Z99.81] 06/06/2017  . Seasonal allergies [J30.2] 06/06/2017  .  Pulmonary hypertension, primary ([place]) [I27.0] 06/06/2017  . CAD (coronary artery disease) [I25.10] 06/06/2017    Past Medical History Past Medical History:  Diagnosis Date  . Allergy   . Anemia 07/25/2017  . Anxiety   . Arthritis   . Atrial fibrillation ([place])   . Cataract   . COPD (chronic obstructive pulmonary disease) ([place])   . Depression   . Diabetes mellitus without complication ([place])   . Emphysema of lung ([place])   . Hyperlipidemia   . Hypertension   . Oxygen deficiency   . Sleep apnea   . Typical atrial flutter [facility])     Past Surgical History Past Surgical History:  Procedure Laterality Date  . A-FLUTTER ABLATION N/A 10/04/2017   Procedure: A-FLUTTER ABLATION;  Surgeon: Thompson Grayer, MD;  Location: [place] [facility];  Service: Cardiovascular;  Laterality: N/A;  . ABDOMINAL HYSTERECTOMY     fibroids  . blood clot removal from base of brain  1990  . CARPAL TUNNEL RELEASE    . CATARACT EXTRACTION W/PHACO Left 06/16/2013   Procedure: CATARACT EXTRACTION PHACO AND INTRAOCULAR LENS PLACEMENT (IOC);  Surgeon: Tonny Branch, MD;  Location: [facility];  Service: Ophthalmology;  Laterality: Left;  CDE:  12.18  . CATARACT EXTRACTION W/PHACO Right 06/26/2013   Procedure: CATARACT EXTRACTION PHACO AND INTRAOCULAR LENS PLACEMENT (IOC);  Surgeon: Tonny Branch, MD;  Location: [facility];  Service: Ophthalmology;  Laterality: Right;  CDE:14.71  .  CESAREAN SECTION    . CHOLECYSTECTOMY    . DILATION AND CURETTAGE OF UTERUS    . EYE SURGERY  12/2016  . FOOT NEUROMA SURGERY Right   . LOOP RECORDER INSERTION N/A 11/06/2017   Procedure: LOOP RECORDER INSERTION;  Surgeon: Thompson Grayer, MD;  Location: [place] [facility];  Service: Cardiovascular;  Laterality: N/A;  . MOUTH SURGERY    . TONSILLECTOMY      Family History Family History  Problem Relation Age of Onset  . Emphysema Mother   . Alcohol abuse Mother   . Arthritis Mother   . COPD Mother   . Depression Mother   . Hyperlipidemia Mother    . Hypertension Mother   . Heart attack Father   . Cancer Father        lung  . Heart disease Father   . Diabetes Brother   . COPD Brother   . Other Maternal Grandmother        swine flu 1919  . Colon cancer Neg Hx   . Colon polyps Neg Hx      Social History  reports that she quit smoking about 17 years ago. Her smoking use included cigarettes. She started smoking about 59 years ago. She smoked 1.50 packs per day. She has never used smokeless tobacco. She reports that she does not drink alcohol or use drugs.  Medications  Current Outpatient Medications:  .  loratadine (CLARITIN) 10 MG tablet, Take 10 mg by mouth daily., Disp: , Rfl:  .  Melatonin 10 MG TABS, Take 1 tablet by mouth at bedtime., Disp: , Rfl:  .  acetaminophen (TYLENOL) 500 MG tablet, Take 1,000 mg by mouth every 6 (six) hours as needed for moderate pain or headache. , Disp: , Rfl:  .  apixaban (ELIQUIS) 5 MG TABS tablet, Take 1 tablet (5 mg total) by mouth 2 (two) times daily., Disp: 180 tablet, Rfl: 3 .  Blood Glucose Monitoring Suppl (FREESTYLE LITE) DEVI, USE 1 TO CHECK GLUCOSE ONCE DAILY, Disp: , Rfl: 0 .  Cholecalciferol (VITAMIN D) 50 MCG (2000 UT) CAPS, Take 2,000 Units by mouth daily. , Disp: , Rfl:  .  Cyanocobalamin 2500 MCG CHEW, Chew 2,500 mcg by mouth daily., Disp: , Rfl:  .  DULoxetine (CYMBALTA) 30 MG capsule, Take 30 mg by mouth daily., Disp: , Rfl:  .  Fluticasone-Salmeterol (ADVAIR DISKUS) 250-50 MCG/DOSE AEPB, Inhale 1 puff into the lungs 2 (two) times daily., Disp: , Rfl:  .  furosemide (LASIX) 20 MG tablet, Take 1 tablet (20 mg total) by mouth as needed. (Patient taking differently: Take 20 mg by mouth daily as needed for fluid. ), Disp: 30 tablet, Rfl: 1 .  glucose blood (ONETOUCH VERIO) test strip, Use as instructed (Patient not taking: Reported on 08/01/2018), Disp: 100 each, Rfl: 0 .  guaiFENesin (MUCINEX) 600 MG 12 hr tablet, Take 600 mg by mouth daily. , Disp: , Rfl:  .  ipratropium-albuterol  (DUONEB) 0.5-2.5 (3) MG/3ML SOLN, Take 3 mLs by nebulization 2 (two) times daily as needed (for shortness of breath or wheezing)., Disp: , Rfl:  .  metFORMIN (GLUCOPHAGE) 500 MG tablet, Take 500 mg by mouth 2 (two) times daily. , Disp: , Rfl:  .  metoprolol succinate (TOPROL-XL) 50 MG 24 hr tablet, Take 50 mg by mouth 2 (two) times daily. Take with or immediately following a meal., Disp: , Rfl:  .  OXYGEN, Inhale 2 L into the lungs at bedtime. , Disp: , Rfl:  .  pravastatin (PRAVACHOL) 20 MG tablet, Take 1 tablet (20 mg total) daily by mouth. (Patient taking differently: Take 20 mg by mouth at bedtime. ), Disp: 90 tablet, Rfl: 3 .  PROAIR HFA 108 (90 Base) MCG/ACT inhaler, USE 1 TO 2 INHALATIONS EVERY 6 HOURS AS NEEDED FOR WHEEZING OR SHORTNESS OF BREATH (Patient taking differently: Inhale 1-2 puffs into the lungs every 6 (six) hours as needed for wheezing or shortness of breath. ), Disp: 8.5 g, Rfl: 1 .  tiotropium (SPIRIVA) 18 MCG inhalation capsule, Place 18 mcg into inhaler and inhale daily., Disp: , Rfl:   Allergies Other; Trazodone; Trazodone and nefazodone; Nickel; Tetanus toxoids; Adhesive [tape]; Bactrim [sulfamethoxazole-trimethoprim]; Penicillins; Ppd [tuberculin purified protein derivative]; and Sulfamethoxazole-trimethoprim  Review of Systems Review of Systems - Oncology ROS negative other than fatigue   Physical Exam  Vitals Wt Readings from Last 3 Encounters:  09/03/18 227 lb (103 kg)  06/28/18 240 lb (108.9 kg)  06/11/18 235 lb 3.2 oz (106.7 kg)   Temp Readings from Last 3 Encounters:  09/03/18 98 F (36.7 C) (Oral)  09/03/18 98.2 F (36.8 C) (Oral)  06/11/18 (!) 97.1 F (36.2 C) (Oral)   BP Readings from Last 3 Encounters:  09/03/18 136/66  09/03/18 (!) 137/58  06/28/18 130/70   Pulse Readings from Last 3 Encounters:  09/03/18 69  09/03/18 71  06/28/18 73   Constitutional: Well-developed, well-nourished, and in no distress.   HENT: Head: Normocephalic and  atraumatic.  Mouth/Throat: No oropharyngeal exudate. Mucosa moist. Eyes: Pupils are equal, round, and reactive to light. Conjunctivae are normal. No scleral icterus.  Neck: Normal range of motion. Neck supple. No JVD present.  Cardiovascular: Normal rate, regular rhythm and normal heart sounds.  Exam reveals no gallop and no friction rub.   No murmur heard. Pulmonary/Chest: Effort normal and breath sounds normal. No respiratory distress. No wheezes.No rales.  Abdominal: Soft. Bowel sounds are normal. No distension. There is no tenderness. There is no guarding.  Musculoskeletal: No edema or tenderness.  Lymphadenopathy: No cervical, axillary or supraclavicular adenopathy.  Neurological: Alert and oriented to person, place, and time. No cranial nerve deficit.  Skin: Skin is warm and dry. No rash noted. No erythema. No pallor.  Psychiatric: Affect and judgment normal.   Labs No visits with results within 3 Day(s) from this visit.  Latest known visit with results is:  Appointment on 08/30/2018  Component Date Value Ref Range Status  . WBC 08/30/2018 6.2  4.0 - 10.5 K/uL Final  . RBC 08/30/2018 3.81* 3.87 - 5.11 MIL/uL Final  . Hemoglobin 08/30/2018 9.9* 12.0 - 15.0 g/dL Final  . HCT 08/30/2018 35.2* 36.0 - 46.0 % Final  . MCV 08/30/2018 92.4  80.0 - 100.0 fL Final  . MCH 08/30/2018 26.0  26.0 - 34.0 pg Final  . MCHC 08/30/2018 28.1* 30.0 - 36.0 g/dL Final  . RDW 08/30/2018 15.5  11.5 - 15.5 % Final  . Platelets 08/30/2018 165  150 - 400 K/uL Final  . nRBC 08/30/2018 0.0  0.0 - 0.2 % Final  . Neutrophils Relative % 08/30/2018 72  % Final  . Neutro Abs 08/30/2018 4.4  1.7 - 7.7 K/uL Final  . Lymphocytes Relative 08/30/2018 20  % Final  . Lymphs Abs 08/30/2018 1.2  0.7 - 4.0 K/uL Final  . Monocytes Relative 08/30/2018 6  % Final  . Monocytes Absolute 08/30/2018 0.4  0.1 - 1.0 K/uL Final  . Eosinophils Relative 08/30/2018 2  % Final  . Eosinophils  Absolute 08/30/2018 0.2  0.0 - 0.5 K/uL  Final  . Basophils Relative 08/30/2018 0  % Final  . Basophils Absolute 08/30/2018 0.0  0.0 - 0.1 K/uL Final  . Immature Granulocytes 08/30/2018 0  % Final  . Abs Immature Granulocytes 08/30/2018 0.01  0.00 - 0.07 K/uL Final   Performed at [facility], [address] [place], [place] [address]  . Sodium 08/30/2018 139  135 - 145 mmol/L Final  . Potassium 08/30/2018 4.0  3.5 - 5.1 mmol/L Final  . Chloride 08/30/2018 103  98 - 111 mmol/L Final  . CO2 08/30/2018 27  22 - 32 mmol/L Final  . Glucose, Bld 08/30/2018 196* 70 - 99 mg/dL Final  . BUN 08/30/2018 16  8 - 23 mg/dL Final  . Creatinine, Ser 08/30/2018 0.88  0.44 - 1.00 mg/dL Final  . Calcium 08/30/2018 9.5  8.9 - 10.3 mg/dL Final  . Total Protein 08/30/2018 7.2  6.5 - 8.1 g/dL Final  . Albumin 08/30/2018 3.7  3.5 - 5.0 g/dL Final  . AST 08/30/2018 25  15 - 41 U/L Final  . ALT 08/30/2018 14  0 - 44 U/L Final  . Alkaline Phosphatase 08/30/2018 75  38 - 126 U/L Final  . Total Bilirubin 08/30/2018 1.5* 0.3 - 1.2 mg/dL Final  . GFR calc non Af Amer 08/30/2018 >60  >60 mL/min Final  . GFR calc Af Amer 08/30/2018 >60  >60 mL/min Final  . Anion gap 08/30/2018 9  5 - 15 Final   Performed at [facility], [address] [place], [place] [address]  . LDH 08/30/2018 109  98 - 192 U/L Final   Performed at [facility], [address], [place], [place] [address]  . Ferritin 08/30/2018 8* 11 - 307 ng/mL Final   Performed at [facility], [address], [place], [place] [address]     Pathology Orders Placed This Encounter  Procedures  . CBC with Differential/Platelet    Standing Status:   Future    Standing Expiration Date:   09/03/2020  . Comprehensive metabolic panel    Standing Status:   Future    Standing Expiration Date:   09/03/2020  . Lactate dehydrogenase    Standing Status:   Future    Standing Expiration Date:   09/03/2020  . Ferritin    Standing Status:   Future    Standing Expiration Date:   09/03/2020       Zoila Shutter MD

## 2018-09-03 NOTE — Patient Instructions (Signed)
[facility] at [facility] Discharge Instructions  Received Feraheme infusion today. Follow-up as scheduled. Call clinic for any questions or concerns   Thank you for choosing [facility] at [facility] to provide your oncology and hematology care.  To afford each patient quality time with our provider, please arrive at least 15 minutes before your scheduled appointment time.   If you have a lab appointment with the Cancer Center please come in thru the  Main Entrance and check in at the main information desk  You need to re-schedule your appointment should you arrive 10 or more minutes late.  We strive to give you quality time with our providers, and arriving late affects you and other patients whose appointments are after yours.  Also, if you no show three or more times for appointments you may be dismissed from the clinic at the providers discretion.     Again, thank you for choosing [facility].  Our hope is that these requests will decrease the amount of time that you wait before being seen by our physicians.       _____________________________________________________________  Should you have questions after your visit to [facility], please contact our office at [phone] between the hours of 8:00 a.m. and 4:30 p.m.  Voicemails left after 4:00 p.m. will not be returned until the following business day.  For prescription refill requests, have your pharmacy contact our office and allow 72 hours.    Cancer Center Support Programs:   > Cancer Support Group  2nd Tuesday of the month 1pm-2pm, Journey Room   

## 2018-09-03 NOTE — Patient Instructions (Signed)
[facility] at [facility]  Discharge Instructions: You saw Dr. Higgs today                               _______________________________________________________________  Thank you for choosing [facility] at [facility] to provide your oncology and hematology care.  To afford each patient quality time with our providers, please arrive at least 15 minutes before your scheduled appointment.  You need to re-schedule your appointment if you arrive 10 or more minutes late.  We strive to give you quality time with our providers, and arriving late affects you and other patients whose appointments are after yours.  Also, if you no show three or more times for appointments you may be dismissed from the clinic.  Again, thank you for choosing  Cancer Center at [facility]. Our hope is that these requests will allow you access to exceptional care and in a timely manner. _______________________________________________________________  If you have questions after your visit, please contact our office at [phone] between the hours of 8:30 a.m. and 5:00 p.m. Voicemails left after 4:30 p.m. will not be returned until the following business day. _______________________________________________________________  For prescription refill requests, have your pharmacy contact our office. _______________________________________________________________  Recommendations made by the consultant and any test results will be sent to your referring physician. _______________________________________________________________ 

## 2018-09-04 DIAGNOSIS — I10 Essential (primary) hypertension: Secondary | ICD-10-CM | POA: Diagnosis not present

## 2018-09-04 DIAGNOSIS — E1165 Type 2 diabetes mellitus with hyperglycemia: Secondary | ICD-10-CM | POA: Diagnosis not present

## 2018-09-04 DIAGNOSIS — J449 Chronic obstructive pulmonary disease, unspecified: Secondary | ICD-10-CM | POA: Diagnosis not present

## 2018-09-04 DIAGNOSIS — Z299 Encounter for prophylactic measures, unspecified: Secondary | ICD-10-CM | POA: Diagnosis not present

## 2018-09-04 DIAGNOSIS — Z6841 Body Mass Index (BMI) 40.0 and over, adult: Secondary | ICD-10-CM | POA: Diagnosis not present

## 2018-09-04 DIAGNOSIS — I4891 Unspecified atrial fibrillation: Secondary | ICD-10-CM | POA: Diagnosis not present

## 2018-09-04 DIAGNOSIS — E1140 Type 2 diabetes mellitus with diabetic neuropathy, unspecified: Secondary | ICD-10-CM | POA: Diagnosis not present

## 2018-09-10 ENCOUNTER — Inpatient Hospital Stay (HOSPITAL_COMMUNITY): Payer: Medicare Other

## 2018-09-10 ENCOUNTER — Other Ambulatory Visit: Payer: Self-pay

## 2018-09-10 ENCOUNTER — Encounter (HOSPITAL_COMMUNITY): Payer: Self-pay

## 2018-09-10 VITALS — BP 150/66 | HR 73 | Temp 97.6°F | Resp 18

## 2018-09-10 DIAGNOSIS — I10 Essential (primary) hypertension: Secondary | ICD-10-CM | POA: Diagnosis not present

## 2018-09-10 DIAGNOSIS — E1169 Type 2 diabetes mellitus with other specified complication: Secondary | ICD-10-CM | POA: Diagnosis not present

## 2018-09-10 DIAGNOSIS — D500 Iron deficiency anemia secondary to blood loss (chronic): Secondary | ICD-10-CM | POA: Diagnosis not present

## 2018-09-10 DIAGNOSIS — E1159 Type 2 diabetes mellitus with other circulatory complications: Secondary | ICD-10-CM | POA: Diagnosis not present

## 2018-09-10 DIAGNOSIS — E1140 Type 2 diabetes mellitus with diabetic neuropathy, unspecified: Secondary | ICD-10-CM | POA: Diagnosis not present

## 2018-09-10 MED ORDER — SODIUM CHLORIDE 0.9 % IV SOLN
510.0000 mg | Freq: Once | INTRAVENOUS | Status: AC
  Administered 2018-09-10: 510 mg via INTRAVENOUS
  Filled 2018-09-10: qty 17

## 2018-09-10 MED ORDER — SODIUM CHLORIDE 0.9% FLUSH
10.0000 mL | Freq: Once | INTRAVENOUS | Status: AC | PRN
  Administered 2018-09-10: 10 mL

## 2018-09-10 MED ORDER — SODIUM CHLORIDE 0.9 % IV SOLN
Freq: Once | INTRAVENOUS | Status: AC
  Administered 2018-09-10: 15:00:00 via INTRAVENOUS

## 2018-09-10 NOTE — Patient Instructions (Signed)
[facility] at [facility]  Discharge Instructions:   _______________________________________________________________  Thank you for choosing [facility] at [facility] to provide your oncology and hematology care.  To afford each patient quality time with our providers, please arrive at least 15 minutes before your scheduled appointment.  You need to re-schedule your appointment if you arrive 10 or more minutes late.  We strive to give you quality time with our providers, and arriving late affects you and other patients whose appointments are after yours.  Also, if you no show three or more times for appointments you may be dismissed from the clinic.  Again, thank you for choosing [facility] at [facility]. Our hope is that these requests will allow you access to exceptional care and in a timely manner. _______________________________________________________________  If you have questions after your visit, please contact our office at [phone] between the hours of 8:30 a.m. and 5:00 p.m. Voicemails left after 4:30 p.m. will not be returned until the following business day. _______________________________________________________________  For prescription refill requests, have your pharmacy contact our office. _______________________________________________________________  Recommendations made by the consultant and any test results will be sent to your referring physician. _______________________________________________________________ 

## 2018-09-10 NOTE — Progress Notes (Signed)
Pt presents today for Feraheme infusion. VSS. Pt has no complaints today of any changes since last visit.   Treatment given today per MD orders. Tolerated infusion without adverse affects. Vital signs stable. No complaints at this time. Discharged from clinic ambulatory. F/U with [facility] as scheduled.

## 2018-09-21 LAB — CUP PACEART REMOTE DEVICE CHECK
Date Time Interrogation Session: 20191103173630
MDC IDC PG IMPLANT DT: 20190212

## 2018-10-01 DIAGNOSIS — E1140 Type 2 diabetes mellitus with diabetic neuropathy, unspecified: Secondary | ICD-10-CM | POA: Diagnosis not present

## 2018-10-01 DIAGNOSIS — Z6841 Body Mass Index (BMI) 40.0 and over, adult: Secondary | ICD-10-CM | POA: Diagnosis not present

## 2018-10-01 DIAGNOSIS — E559 Vitamin D deficiency, unspecified: Secondary | ICD-10-CM | POA: Diagnosis not present

## 2018-10-01 DIAGNOSIS — R42 Dizziness and giddiness: Secondary | ICD-10-CM | POA: Diagnosis not present

## 2018-10-01 DIAGNOSIS — E1165 Type 2 diabetes mellitus with hyperglycemia: Secondary | ICD-10-CM | POA: Diagnosis not present

## 2018-10-01 DIAGNOSIS — E538 Deficiency of other specified B group vitamins: Secondary | ICD-10-CM | POA: Diagnosis not present

## 2018-10-01 DIAGNOSIS — Z299 Encounter for prophylactic measures, unspecified: Secondary | ICD-10-CM | POA: Diagnosis not present

## 2018-10-01 DIAGNOSIS — I10 Essential (primary) hypertension: Secondary | ICD-10-CM | POA: Diagnosis not present

## 2018-10-02 ENCOUNTER — Ambulatory Visit (INDEPENDENT_AMBULATORY_CARE_PROVIDER_SITE_OTHER): Payer: Medicare Other

## 2018-10-02 DIAGNOSIS — I480 Paroxysmal atrial fibrillation: Secondary | ICD-10-CM

## 2018-10-03 LAB — CUP PACEART REMOTE DEVICE CHECK
Date Time Interrogation Session: 20200108173634
Implantable Pulse Generator Implant Date: 20190212

## 2018-10-03 NOTE — Progress Notes (Signed)
Carelink Summary Report / Loop Recorder 

## 2018-10-09 DIAGNOSIS — M792 Neuralgia and neuritis, unspecified: Secondary | ICD-10-CM | POA: Diagnosis not present

## 2018-10-09 DIAGNOSIS — L02219 Cutaneous abscess of trunk, unspecified: Secondary | ICD-10-CM | POA: Diagnosis not present

## 2018-10-09 DIAGNOSIS — L570 Actinic keratosis: Secondary | ICD-10-CM | POA: Diagnosis not present

## 2018-10-09 DIAGNOSIS — L280 Lichen simplex chronicus: Secondary | ICD-10-CM | POA: Diagnosis not present

## 2018-10-09 DIAGNOSIS — L0100 Impetigo, unspecified: Secondary | ICD-10-CM | POA: Diagnosis not present

## 2018-10-09 DIAGNOSIS — B351 Tinea unguium: Secondary | ICD-10-CM | POA: Diagnosis not present

## 2018-10-09 DIAGNOSIS — M79671 Pain in right foot: Secondary | ICD-10-CM | POA: Diagnosis not present

## 2018-10-09 DIAGNOSIS — E1140 Type 2 diabetes mellitus with diabetic neuropathy, unspecified: Secondary | ICD-10-CM | POA: Diagnosis not present

## 2018-10-09 DIAGNOSIS — M79672 Pain in left foot: Secondary | ICD-10-CM | POA: Diagnosis not present

## 2018-10-09 DIAGNOSIS — L821 Other seborrheic keratosis: Secondary | ICD-10-CM | POA: Diagnosis not present

## 2018-10-13 LAB — CUP PACEART REMOTE DEVICE CHECK
Date Time Interrogation Session: 20191206173541
Implantable Pulse Generator Implant Date: 20190212

## 2018-10-18 ENCOUNTER — Inpatient Hospital Stay (HOSPITAL_COMMUNITY): Payer: Medicare Other | Attending: Hematology

## 2018-10-18 DIAGNOSIS — Z79899 Other long term (current) drug therapy: Secondary | ICD-10-CM | POA: Insufficient documentation

## 2018-10-18 DIAGNOSIS — Z7901 Long term (current) use of anticoagulants: Secondary | ICD-10-CM | POA: Diagnosis not present

## 2018-10-18 DIAGNOSIS — Z87891 Personal history of nicotine dependence: Secondary | ICD-10-CM | POA: Insufficient documentation

## 2018-10-18 DIAGNOSIS — I10 Essential (primary) hypertension: Secondary | ICD-10-CM | POA: Insufficient documentation

## 2018-10-18 DIAGNOSIS — D500 Iron deficiency anemia secondary to blood loss (chronic): Secondary | ICD-10-CM | POA: Diagnosis not present

## 2018-10-18 DIAGNOSIS — E119 Type 2 diabetes mellitus without complications: Secondary | ICD-10-CM | POA: Diagnosis not present

## 2018-10-18 DIAGNOSIS — I2510 Atherosclerotic heart disease of native coronary artery without angina pectoris: Secondary | ICD-10-CM | POA: Diagnosis not present

## 2018-10-18 LAB — CBC WITH DIFFERENTIAL/PLATELET
Abs Immature Granulocytes: 0.01 10*3/uL (ref 0.00–0.07)
Basophils Absolute: 0 10*3/uL (ref 0.0–0.1)
Basophils Relative: 0 %
Eosinophils Absolute: 0.1 10*3/uL (ref 0.0–0.5)
Eosinophils Relative: 3 %
HCT: 44.3 % (ref 36.0–46.0)
Hemoglobin: 13.5 g/dL (ref 12.0–15.0)
Immature Granulocytes: 0 %
Lymphocytes Relative: 21 %
Lymphs Abs: 1.1 10*3/uL (ref 0.7–4.0)
MCH: 31.1 pg (ref 26.0–34.0)
MCHC: 30.5 g/dL (ref 30.0–36.0)
MCV: 102.1 fL — AB (ref 80.0–100.0)
Monocytes Absolute: 0.3 10*3/uL (ref 0.1–1.0)
Monocytes Relative: 6 %
NEUTROS PCT: 70 %
Neutro Abs: 3.7 10*3/uL (ref 1.7–7.7)
Platelets: 156 10*3/uL (ref 150–400)
RBC: 4.34 MIL/uL (ref 3.87–5.11)
RDW: 18.7 % — ABNORMAL HIGH (ref 11.5–15.5)
WBC: 5.3 10*3/uL (ref 4.0–10.5)
nRBC: 0 % (ref 0.0–0.2)

## 2018-10-18 LAB — COMPREHENSIVE METABOLIC PANEL
ALT: 23 U/L (ref 0–44)
AST: 34 U/L (ref 15–41)
Albumin: 3.9 g/dL (ref 3.5–5.0)
Alkaline Phosphatase: 95 U/L (ref 38–126)
Anion gap: 7 (ref 5–15)
BUN: 20 mg/dL (ref 8–23)
CO2: 33 mmol/L — ABNORMAL HIGH (ref 22–32)
Calcium: 10.4 mg/dL — ABNORMAL HIGH (ref 8.9–10.3)
Chloride: 99 mmol/L (ref 98–111)
Creatinine, Ser: 0.88 mg/dL (ref 0.44–1.00)
GFR calc Af Amer: >60 mL/min (ref >60)
GFR calc non Af Amer: >60 mL/min (ref >60)
Glucose, Bld: 137 mg/dL — ABNORMAL HIGH (ref 70–99)
Potassium: 4.3 mmol/L (ref 3.5–5.1)
Sodium: 139 mmol/L (ref 135–145)
Total Bilirubin: 1.6 mg/dL — ABNORMAL HIGH (ref 0.3–1.2)
Total Protein: 7.9 g/dL (ref 6.5–8.1)

## 2018-10-18 LAB — LACTATE DEHYDROGENASE: LDH: 128 U/L (ref 98–192)

## 2018-10-18 LAB — FERRITIN: Ferritin: 42 ng/mL (ref 11–307)

## 2018-10-22 ENCOUNTER — Encounter (HOSPITAL_COMMUNITY): Payer: Self-pay | Admitting: Internal Medicine

## 2018-10-22 ENCOUNTER — Other Ambulatory Visit: Payer: Self-pay

## 2018-10-22 ENCOUNTER — Inpatient Hospital Stay (HOSPITAL_BASED_OUTPATIENT_CLINIC_OR_DEPARTMENT_OTHER): Payer: Medicare Other | Admitting: Internal Medicine

## 2018-10-22 VITALS — BP 136/67 | HR 77 | Temp 97.6°F | Resp 16 | Wt 225.3 lb

## 2018-10-22 DIAGNOSIS — I2510 Atherosclerotic heart disease of native coronary artery without angina pectoris: Secondary | ICD-10-CM | POA: Diagnosis not present

## 2018-10-22 DIAGNOSIS — Z79899 Other long term (current) drug therapy: Secondary | ICD-10-CM | POA: Diagnosis not present

## 2018-10-22 DIAGNOSIS — Z7901 Long term (current) use of anticoagulants: Secondary | ICD-10-CM | POA: Diagnosis not present

## 2018-10-22 DIAGNOSIS — I10 Essential (primary) hypertension: Secondary | ICD-10-CM | POA: Diagnosis not present

## 2018-10-22 DIAGNOSIS — D500 Iron deficiency anemia secondary to blood loss (chronic): Secondary | ICD-10-CM

## 2018-10-22 DIAGNOSIS — Z87891 Personal history of nicotine dependence: Secondary | ICD-10-CM

## 2018-10-22 DIAGNOSIS — E119 Type 2 diabetes mellitus without complications: Secondary | ICD-10-CM | POA: Diagnosis not present

## 2018-10-22 NOTE — Progress Notes (Signed)
Diagnosis Iron deficiency anemia due to chronic blood loss - Plan: CBC with Differential/Platelet, Comprehensive metabolic panel, Lactate dehydrogenase, Ferritin  Staging Cancer Staging No matching staging information was found for the patient.  Assessment and Plan:  1.  Iron deficiency anemia (IDA).  Pt was last treated with IV iron 08/2018.  Labs done 10/18/2018 reviewed and showed WBC 5.3 HB 13.5 plts 156,000.  Chemistries WNL with K+ 4.3 Cr 0.88 and normal LFTs.  Ferritin improved from 8 to 42.  HB improved from 9.9 to 13.    Pt has been referred to GI due to recurrent IDA.   She will RTC in 01/2019 with labs and follow-up.    2.  Fatigue. She reports this improved after IV iron.    3.  HTN.  BP is 136/67.   Follow-up with PCP.    4.  DM. Follow-up with PCP.    5.  Health maintenance.  Pt should follow-up with GI due to IDA.  Mammogram screenings as recommended.    Current Status:  Pt is seen today for follow-up.  She is here for follow-up after IV iron.  She reports she felt better after IV iron.   Problem List Patient Active Problem List   Diagnosis Date Noted  . Atrial flutter ([place]) [I48.92] 10/31/2017  . Bartholin's gland cyst [N75.0] 10/15/2017  . Major depression, recurrent, chronic ([place]) [F33.9] 10/15/2017  . IDA (iron deficiency anemia) [D50.9] 08/22/2017  . COPD with hypoxia ([place]) [J44.9, R09.02] 06/06/2017  . Type 2 diabetes mellitus with other specified complication ([place]) [W29.56] 06/06/2017  . Hypertension [I10] 06/06/2017  . HLD (hyperlipidemia) [E78.5] 06/06/2017  . Atrial fibrillation ([place]) [I48.91] 06/06/2017  . Morbid obesity ([place]) [E66.01] 06/06/2017  . Current use of long term anticoagulation [Z79.01] 06/06/2017  . OSA on CPAP [G47.33, Z99.89] 06/06/2017  . Oxygen dependent [Z99.81] 06/06/2017  . Seasonal allergies [J30.2] 06/06/2017  . Pulmonary hypertension, primary ([place]) [I27.0] 06/06/2017  . CAD (coronary artery disease) [I25.10] 06/06/2017    Past  Medical History Past Medical History:  Diagnosis Date  . Allergy   . Anemia 07/25/2017  . Anxiety   . Arthritis   . Atrial fibrillation ([place])   . Cataract   . COPD (chronic obstructive pulmonary disease) ([place])   . Depression   . Diabetes mellitus without complication ([place])   . Emphysema of lung ([place])   . Hyperlipidemia   . Hypertension   . Oxygen deficiency   . Sleep apnea   . Typical atrial flutter [facility])     Past Surgical History Past Surgical History:  Procedure Laterality Date  . A-FLUTTER ABLATION N/A 10/04/2017   Procedure: A-FLUTTER ABLATION;  Surgeon: Thompson Grayer, MD;  Location: [place] [facility];  Service: Cardiovascular;  Laterality: N/A;  . ABDOMINAL HYSTERECTOMY     fibroids  . blood clot removal from base of brain  1990  . CARPAL TUNNEL RELEASE    . CATARACT EXTRACTION W/PHACO Left 06/16/2013   Procedure: CATARACT EXTRACTION PHACO AND INTRAOCULAR LENS PLACEMENT (IOC);  Surgeon: Tonny Branch, MD;  Location: [facility];  Service: Ophthalmology;  Laterality: Left;  CDE:  12.18  . CATARACT EXTRACTION W/PHACO Right 06/26/2013   Procedure: CATARACT EXTRACTION PHACO AND INTRAOCULAR LENS PLACEMENT (IOC);  Surgeon: Tonny Branch, MD;  Location: [facility];  Service: Ophthalmology;  Laterality: Right;  CDE:14.71  . CESAREAN SECTION    . CHOLECYSTECTOMY    . DILATION AND CURETTAGE OF UTERUS    . EYE SURGERY  12/2016  .  FOOT NEUROMA SURGERY Right   . LOOP RECORDER INSERTION N/A 11/06/2017   Procedure: LOOP RECORDER INSERTION;  Surgeon: Thompson Grayer, MD;  Location: [place] [facility];  Service: Cardiovascular;  Laterality: N/A;  . MOUTH SURGERY    . TONSILLECTOMY      Family History Family History  Problem Relation Age of Onset  . Emphysema Mother   . Alcohol abuse Mother   . Arthritis Mother   . COPD Mother   . Depression Mother   . Hyperlipidemia Mother   . Hypertension Mother   . Heart attack Father   . Cancer Father        lung  . Heart disease Father   . Diabetes  Brother   . COPD Brother   . Other Maternal Grandmother        swine flu 1919  . Colon cancer Neg Hx   . Colon polyps Neg Hx      Social History  reports that she quit smoking about 17 years ago. Her smoking use included cigarettes. She started smoking about 60 years ago. She smoked 1.50 packs per day. She has never used smokeless tobacco. She reports that she does not drink alcohol or use drugs.  Medications  Current Outpatient Medications:  .  acetaminophen (TYLENOL) 500 MG tablet, Take 1,000 mg by mouth every 6 (six) hours as needed for moderate pain or headache. , Disp: , Rfl:  .  apixaban (ELIQUIS) 5 MG TABS tablet, Take 1 tablet (5 mg total) by mouth 2 (two) times daily., Disp: 180 tablet, Rfl: 3 .  Blood Glucose Monitoring Suppl (FREESTYLE LITE) DEVI, USE 1 TO CHECK GLUCOSE ONCE DAILY, Disp: , Rfl: 0 .  Cholecalciferol (VITAMIN D) 50 MCG (2000 UT) CAPS, Take 2,000 Units by mouth daily. , Disp: , Rfl:  .  Cyanocobalamin 2500 MCG CHEW, Chew 2,500 mcg by mouth daily., Disp: , Rfl:  .  doxycycline (VIBRA-TABS) 100 MG tablet, TAKE 1 TABLET BY MOUTH ONCE DAILY WITH FOOD, Disp: , Rfl:  .  DULoxetine (CYMBALTA) 30 MG capsule, Take 30 mg by mouth daily., Disp: , Rfl:  .  Fluticasone-Salmeterol (ADVAIR DISKUS) 250-50 MCG/DOSE AEPB, Inhale 1 puff into the lungs 2 (two) times daily., Disp: , Rfl:  .  glucose blood (ONETOUCH VERIO) test strip, Use as instructed, Disp: 100 each, Rfl: 0 .  guaiFENesin (MUCINEX) 600 MG 12 hr tablet, Take 600 mg by mouth daily. , Disp: , Rfl:  .  ipratropium-albuterol (DUONEB) 0.5-2.5 (3) MG/3ML SOLN, Take 3 mLs by nebulization 2 (two) times daily as needed (for shortness of breath or wheezing)., Disp: , Rfl:  .  loratadine (CLARITIN) 10 MG tablet, Take 10 mg by mouth daily., Disp: , Rfl:  .  Melatonin 10 MG TABS, Take 1 tablet by mouth at bedtime., Disp: , Rfl:  .  metFORMIN (GLUCOPHAGE) 500 MG tablet, Take 500 mg by mouth 2 (two) times daily. , Disp: , Rfl:  .   metoprolol succinate (TOPROL-XL) 50 MG 24 hr tablet, Take 50 mg by mouth 2 (two) times daily. Take with or immediately following a meal., Disp: , Rfl:  .  OXYGEN, Inhale 2 L into the lungs at bedtime. , Disp: , Rfl:  .  pravastatin (PRAVACHOL) 20 MG tablet, Take 1 tablet (20 mg total) daily by mouth. (Patient taking differently: Take 20 mg by mouth at bedtime. ), Disp: 90 tablet, Rfl: 3 .  PROAIR HFA 108 (90 Base) MCG/ACT inhaler, USE 1 TO 2 INHALATIONS EVERY  6 HOURS AS NEEDED FOR WHEEZING OR SHORTNESS OF BREATH (Patient taking differently: Inhale 1-2 puffs into the lungs every 6 (six) hours as needed for wheezing or shortness of breath. ), Disp: 8.5 g, Rfl: 1 .  tiotropium (SPIRIVA) 18 MCG inhalation capsule, Place 18 mcg into inhaler and inhale daily., Disp: , Rfl:  .  furosemide (LASIX) 20 MG tablet, Take 1 tablet (20 mg total) by mouth as needed. (Patient not taking: Reported on 10/22/2018), Disp: 30 tablet, Rfl: 1  Allergies Other; Trazodone; Trazodone and nefazodone; Nickel; Tetanus toxoids; Adhesive [tape]; Bactrim [sulfamethoxazole-trimethoprim]; Penicillins; Ppd [tuberculin purified protein derivative]; and Sulfamethoxazole-trimethoprim  Review of Systems Review of Systems - Oncology ROS negative   Physical Exam  Vitals Wt Readings from Last 3 Encounters:  10/22/18 225 lb 5 oz (102.2 kg)  09/03/18 227 lb (103 kg)  06/28/18 240 lb (108.9 kg)   Temp Readings from Last 3 Encounters:  10/22/18 97.6 F (36.4 C) (Oral)  09/10/18 97.6 F (36.4 C) (Oral)  09/03/18 98 F (36.7 C) (Oral)   BP Readings from Last 3 Encounters:  10/22/18 136/67  09/10/18 (!) 150/66  09/03/18 136/66   Pulse Readings from Last 3 Encounters:  10/22/18 77  09/10/18 73  09/03/18 69   Constitutional: Well-developed, well-nourished, and in no distress.   HENT: Head: Normocephalic and atraumatic.  Mouth/Throat: No oropharyngeal exudate. Mucosa moist. Eyes: Pupils are equal, round, and reactive to  light. Conjunctivae are normal. No scleral icterus.  Neck: Normal range of motion. Neck supple. No JVD present.  Cardiovascular: Normal rate, regular rhythm and normal heart sounds.  Exam reveals no gallop and no friction rub.   No murmur heard. Pulmonary/Chest: Effort normal and breath sounds normal. No respiratory distress. No wheezes.No rales.  Abdominal: Soft. Bowel sounds are normal. No distension. There is no tenderness. There is no guarding.  Musculoskeletal: No edema or tenderness.  Lymphadenopathy: No cervical, axillary or supraclavicular adenopathy.  Neurological: Alert and oriented to person, place, and time. No cranial nerve deficit.  Skin: Skin is warm and dry. No rash noted. No erythema. No pallor.  Psychiatric: Affect and judgment normal.   Labs No visits with results within 3 Day(s) from this visit.  Latest known visit with results is:  Appointment on 10/18/2018  Component Date Value Ref Range Status  . WBC 10/18/2018 5.3  4.0 - 10.5 K/uL Final  . RBC 10/18/2018 4.34  3.87 - 5.11 MIL/uL Final  . Hemoglobin 10/18/2018 13.5  12.0 - 15.0 g/dL Final  . HCT 10/18/2018 44.3  36.0 - 46.0 % Final  . MCV 10/18/2018 102.1* 80.0 - 100.0 fL Final  . MCH 10/18/2018 31.1  26.0 - 34.0 pg Final  . MCHC 10/18/2018 30.5  30.0 - 36.0 g/dL Final  . RDW 10/18/2018 18.7* 11.5 - 15.5 % Final  . Platelets 10/18/2018 156  150 - 400 K/uL Final  . nRBC 10/18/2018 0.0  0.0 - 0.2 % Final  . Neutrophils Relative % 10/18/2018 70  % Final  . Neutro Abs 10/18/2018 3.7  1.7 - 7.7 K/uL Final  . Lymphocytes Relative 10/18/2018 21  % Final  . Lymphs Abs 10/18/2018 1.1  0.7 - 4.0 K/uL Final  . Monocytes Relative 10/18/2018 6  % Final  . Monocytes Absolute 10/18/2018 0.3  0.1 - 1.0 K/uL Final  . Eosinophils Relative 10/18/2018 3  % Final  . Eosinophils Absolute 10/18/2018 0.1  0.0 - 0.5 K/uL Final  . Basophils Relative 10/18/2018 0  % Final  .  Basophils Absolute 10/18/2018 0.0  0.0 - 0.1 K/uL Final  .  Immature Granulocytes 10/18/2018 0  % Final  . Abs Immature Granulocytes 10/18/2018 0.01  0.00 - 0.07 K/uL Final   Performed at [facility], [address], [place], [place] [address]  . Sodium 10/18/2018 139  135 - 145 mmol/L Final  . Potassium 10/18/2018 4.3  3.5 - 5.1 mmol/L Final  . Chloride 10/18/2018 99  98 - 111 mmol/L Final  . CO2 10/18/2018 33* 22 - 32 mmol/L Final  . Glucose, Bld 10/18/2018 137* 70 - 99 mg/dL Final  . BUN 10/18/2018 20  8 - 23 mg/dL Final  . Creatinine, Ser 10/18/2018 0.88  0.44 - 1.00 mg/dL Final  . Calcium 10/18/2018 10.4* 8.9 - 10.3 mg/dL Final  . Total Protein 10/18/2018 7.9  6.5 - 8.1 g/dL Final  . Albumin 10/18/2018 3.9  3.5 - 5.0 g/dL Final  . AST 10/18/2018 34  15 - 41 U/L Final  . ALT 10/18/2018 23  0 - 44 U/L Final  . Alkaline Phosphatase 10/18/2018 95  38 - 126 U/L Final  . Total Bilirubin 10/18/2018 1.6* 0.3 - 1.2 mg/dL Final  . GFR calc non Af Amer 10/18/2018 >60  >60 mL/min Final  . GFR calc Af Amer 10/18/2018 >60  >60 mL/min Final  . Anion gap 10/18/2018 7  5 - 15 Final   Performed at [facility], [address], [place], [place] [address]  . LDH 10/18/2018 128  98 - 192 U/L Final   Performed at [facility], [address], [place], [place] [address]  . Ferritin 10/18/2018 42  11 - 307 ng/mL Final   Performed at [facility], [address], [place],  [address]     Pathology Orders Placed This Encounter  Procedures  . CBC with Differential/Platelet    Standing Status:   Future    Standing Expiration Date:   10/23/2019  . Comprehensive metabolic panel    Standing Status:   Future    Standing Expiration Date:   10/23/2019  . Lactate dehydrogenase    Standing Status:   Future    Standing Expiration Date:   10/23/2019  . Ferritin    Standing Status:   Future    Standing Expiration Date:   10/23/2019       Zoila Shutter MD

## 2018-10-28 DIAGNOSIS — H35372 Puckering of macula, left eye: Secondary | ICD-10-CM | POA: Diagnosis not present

## 2018-11-01 ENCOUNTER — Ambulatory Visit (INDEPENDENT_AMBULATORY_CARE_PROVIDER_SITE_OTHER): Payer: Medicare Other | Admitting: Gastroenterology

## 2018-11-01 ENCOUNTER — Encounter: Payer: Self-pay | Admitting: Gastroenterology

## 2018-11-01 ENCOUNTER — Telehealth: Payer: Self-pay

## 2018-11-01 ENCOUNTER — Other Ambulatory Visit: Payer: Self-pay

## 2018-11-01 VITALS — BP 142/75 | HR 78 | Temp 96.8°F | Ht 63.0 in | Wt 227.4 lb

## 2018-11-01 DIAGNOSIS — D509 Iron deficiency anemia, unspecified: Secondary | ICD-10-CM

## 2018-11-01 NOTE — Assessment & Plan Note (Addendum)
73 year old female with new onset IDA in late 2018, followed closely by Hematology and requiring iron infusions. She has been unable to proceed with colonoscopy/EGD till now due to health issues. She remains on Eliquis BID for afib, and she has no overt GI bleeding. No concerning upper or lower GI signs/symptoms. Applauded on her exercise regimen; objectively, she appears to be in much better spirits and health than the last time I saw her. Will need to hold Eliquis 48 hours prior to procedure. This was cleared by cardiology in 2018, but we will reach out again now.   Proceed with colonoscopy +/- EGD with Dr. Oneida Alar in the near future. The risks, benefits, and alternatives have been discussed in detail with the patient. They state understanding and desire to proceed.  Propofol due to polypharmacy HOLD ELIQUIS X 48 hours prior: cardiology aware.  No metformin day of procedure

## 2018-11-01 NOTE — Progress Notes (Signed)
cc'ed to pcp °

## 2018-11-01 NOTE — Telephone Encounter (Signed)
Tried to call pt to inform of pre-op appt 01/15/19 at 12:45pm, no answer, LMOVM. Letter mailed.

## 2018-11-01 NOTE — Progress Notes (Addendum)
REVIEWED-NO ADDITIONAL RECOMMENDATIONS.  Referring Provider: [name], MD Primary Care Physician:  [name], MD  Primary GI: Dr. Oneida Alar Primary Cardiologist: Dr. Rayann Heman  Chief Complaint  Patient presents with  . Anemia    wants to reschedule proc    HPI:   Linda [name] is a 73 y.o. female presenting today with a history of IDA, followed by Hematology. Found to have [place] in 2018. We have been trying to arrange colonoscopy/EGD but had to be postponed due to health issues. On Eliquis.  Underwent aflutter ablation Jan 2019. Loop recorder insertion Feb 2019.   Last ferritin 8 in Dec 2019, Hgb 9.9. Now improved s/p iron infusions to ferritin 42, Hgb 13.5. She is no longer on oxygen during the day! She is only on oxygen at night.   Joined the gym, which has helped with her energy. Goes three times a week and walks on the treadmill. [employer]. Oxygen just at night. No abdominal pain. No overt GI bleeding. Appetite is good. No unintentional weight loss. She feels much better with exercising regularly. A1c has improved to the 5 range per patient, as she has changed her dietary habits. Overall, feels much better than when last seen.    Colonoscopy in remote past in [place], reportedly told she had one polyp.    Past Medical History:  Diagnosis Date  . Allergy   . Anemia 07/25/2017  . Anxiety   . Arthritis   . Atrial fibrillation ([place])   . Cataract   . COPD (chronic obstructive pulmonary disease) ([place])   . Depression   . Diabetes mellitus without complication ([place])   . Emphysema of lung ([place])   . Hyperlipidemia   . Hypertension   . Oxygen deficiency   . Sleep apnea   . Typical atrial flutter [facility])     Past Surgical History:  Procedure Laterality Date  . A-FLUTTER ABLATION N/A 10/04/2017   Procedure: A-FLUTTER ABLATION;  Surgeon: Thompson Grayer, MD;  Location: [place] [facility];  Service: Cardiovascular;  Laterality: N/A;  . ABDOMINAL HYSTERECTOMY     fibroids  . blood clot removal from base of brain  1990  . CARPAL TUNNEL RELEASE    . CATARACT EXTRACTION W/PHACO Left 06/16/2013   Procedure: CATARACT EXTRACTION PHACO AND INTRAOCULAR LENS PLACEMENT (IOC);  Surgeon: Tonny Branch, MD;  Location: [facility];  Service: Ophthalmology;  Laterality: Left;  CDE:  12.18  . CATARACT EXTRACTION W/PHACO Right 06/26/2013   Procedure: CATARACT EXTRACTION PHACO AND INTRAOCULAR LENS PLACEMENT (IOC);  Surgeon: Tonny Branch, MD;  Location: [facility];  Service: Ophthalmology;  Laterality: Right;  CDE:14.71  . CESAREAN SECTION    . CHOLECYSTECTOMY    . DILATION AND CURETTAGE OF UTERUS    . EYE SURGERY  12/2016  . FOOT NEUROMA SURGERY Right   . LOOP RECORDER INSERTION N/A 11/06/2017   Procedure: LOOP RECORDER INSERTION;  Surgeon: Thompson Grayer, MD;  Location: [place] [facility];  Service: Cardiovascular;  Laterality: N/A;  . MOUTH SURGERY    . TONSILLECTOMY      Current Outpatient Medications  Medication Sig Dispense Refill  . acetaminophen (TYLENOL) 500 MG tablet Take 1,000 mg by mouth every 6 (six) hours as needed for moderate pain or headache.     . Alpha-Lipoic Acid 200 MG TABS Take by mouth 2 (two) times daily.    Marland Kitchen apixaban (ELIQUIS) 5 MG TABS tablet Take 1 tablet (5 mg total) by mouth 2 (two) times daily. 180 tablet  3  . Cholecalciferol (VITAMIN D) 50 MCG (2000 UT) CAPS Take 2,000 Units by mouth daily.     . DULoxetine (CYMBALTA) 30 MG capsule Take 30 mg by mouth daily.    . Fluticasone-Salmeterol (ADVAIR DISKUS) 250-50 MCG/DOSE AEPB Inhale 1 puff into the lungs 2 (two) times daily.    Marland Kitchen glucose blood (ONETOUCH VERIO) test strip Use as instructed 100 each 0  . guaiFENesin (MUCINEX) 600 MG 12 hr tablet Take 600 mg by mouth daily.     . Melatonin 10 MG TABS Take 1 tablet by mouth at bedtime.    . metFORMIN (GLUCOPHAGE) 500 MG tablet Take 500 mg by mouth daily.     . metoprolol succinate (TOPROL-XL) 50 MG 24 hr tablet Take 50 mg by mouth 2 (two) times daily. Take  with or immediately following a meal.    . Multiple Vitamin (MULTIVITAMIN) tablet Take 1 tablet by mouth daily.    . OXYGEN Inhale 2 L into the lungs at bedtime.     . polyethylene glycol (MIRALAX / GLYCOLAX) packet Take 17 g by mouth daily as needed.    . pravastatin (PRAVACHOL) 20 MG tablet Take 1 tablet (20 mg total) daily by mouth. (Patient taking differently: Take 20 mg by mouth at bedtime. ) 90 tablet 3  . PROAIR HFA 108 (90 Base) MCG/ACT inhaler USE 1 TO 2 INHALATIONS EVERY 6 HOURS AS NEEDED FOR WHEEZING OR SHORTNESS OF BREATH (Patient taking differently: Inhale 1-2 puffs into the lungs every 6 (six) hours as needed for wheezing or shortness of breath. ) 8.5 g 1  . Probiotic Product (PROBIOTIC DAILY PO) Take by mouth daily.    . pseudoephedrine (SUDAFED) 120 MG 12 hr tablet Take 120 mg by mouth daily.    Marland Kitchen tiotropium (SPIRIVA) 18 MCG inhalation capsule Place 18 mcg into inhaler and inhale daily.    . vitamin B-12 (CYANOCOBALAMIN) 1000 MCG tablet Take 1,000 mcg by mouth daily.    Marland Kitchen loratadine (CLARITIN) 10 MG tablet Take 10 mg by mouth daily.     No current facility-administered medications for this visit.     Allergies as of 11/01/2018 - Review Complete 11/01/2018  Allergen Reaction Noted  . Other Swelling and Other (See Comments) 06/09/2013  . Trazodone Hives and Other (See Comments) 01/10/2017  . Trazodone and nefazodone Hives and Other (See Comments) 01/10/2017  . Nickel Other (See Comments) 10/04/2017  . Tetanus toxoids Other (See Comments) 06/28/2018  . Adhesive [tape] Itching 06/09/2013  . Bactrim [sulfamethoxazole-trimethoprim] Itching and Rash 06/09/2013  . Penicillins Itching, Rash, and Other (See Comments) 06/09/2013  . Ppd [tuberculin purified protein derivative] Swelling and Other (See Comments) 06/09/2013  . Sulfamethoxazole-trimethoprim Itching and Rash 06/09/2013    Family History  Problem Relation Age of Onset  . Emphysema Mother   . Alcohol abuse Mother   .  Arthritis Mother   . COPD Mother   . Depression Mother   . Hyperlipidemia Mother   . Hypertension Mother   . Heart attack Father   . Cancer Father        lung  . Heart disease Father   . Diabetes Brother   . COPD Brother   . Other Maternal Grandmother        swine flu 1919  . Colon cancer Neg Hx   . Colon polyps Neg Hx     Social History   Socioeconomic History  . Marital status: Widowed    Spouse name: Not on file  .  Number of children: 1  . Years of education: 78  . Highest education level: Not on file  Occupational History  . Occupation: retired    Comment: catering, [employer]  . Financial resource strain: Not on file  . Food insecurity:    Worry: Not on file    Inability: Not on file  . Transportation needs:    Medical: Not on file    Non-medical: Not on file  Tobacco Use  . Smoking status: Former Smoker    Packs/day: 1.50    Types: Cigarettes    Start date: 09/25/1958    Last attempt to quit: 06/06/2001    Years since quitting: 17.4  . Smokeless tobacco: Never Used  Substance and Sexual Activity  . Alcohol use: No  . Drug use: No  . Sexual activity: Not Currently  Lifestyle  . Physical activity:    Days per week: Not on file    Minutes per session: Not on file  . Stress: Not on file  Relationships  . Social connections:    Talks on phone: Not on file    Gets together: Not on file    Attends religious service: Not on file    Active member of club or organization: Not on file    Attends meetings of clubs or organizations: Not on file    Relationship status: Not on file  Other Topics Concern  . Not on file  Social History Narrative   Lives alone   Son lives in [place]   TV, reads, puzzles    Review of Systems: Gen: Denies fever, chills, anorexia. Denies fatigue, weakness, weight loss.  CV: Denies chest pain, palpitations, syncope, peripheral edema, and claudication. Resp: Denies dyspnea at rest, cough, wheezing, coughing up  blood, and pleurisy. GI: see HPI  Derm: Denies rash, itching, dry skin Psych: Denies depression, anxiety, memory loss, confusion. No homicidal or suicidal ideation.  Heme: Denies bruising, bleeding, and enlarged lymph nodes.  Physical Exam: BP (!) 142/75   Pulse 78   Temp (!) 96.8 F (36 C) (Oral)   Ht 5\' 3"  (1.6 m)   Wt 227 lb 6.4 oz (103.1 kg)   BMI 40.28 kg/m  General:   Alert and oriented. No distress noted. Pleasant and cooperative.  Head:  Normocephalic and atraumatic. Eyes:  Conjuctiva clear without scleral icterus. Mouth:  Oral mucosa pink and moist.  Lungs: clear bilaterally Cardiac: S1 S2 present, irregularly irregular  Abdomen:  +BS, soft, non-tender and non-distended. No rebound or guarding. No HSM or masses noted. Msk:  Symmetrical without gross deformities. Normal posture. Extremities:  Without edema. Neurologic:  Alert and  oriented x4 Psych:  Alert and cooperative. Normal mood and affect.

## 2018-11-01 NOTE — Patient Instructions (Signed)
We have scheduled you for a colonoscopy and possible upper endoscopy in the near future.  Please stop Eliquis 48 hours prior to the procedure, and no metformin the day of the procedure.  It was great to see you again!  We will see you in 3-4 months after procedure.  I enjoyed seeing you again today! As you know, I value our relationship and want to provide genuine, compassionate, and quality care. I welcome your feedback. If you receive a survey regarding your visit,  I greatly appreciate you taking time to fill this out. See you next time!  Annitta Needs, PhD, ANP-BC [facility]

## 2018-11-04 ENCOUNTER — Ambulatory Visit (INDEPENDENT_AMBULATORY_CARE_PROVIDER_SITE_OTHER): Payer: Medicare Other

## 2018-11-04 ENCOUNTER — Telehealth: Payer: Self-pay

## 2018-11-04 DIAGNOSIS — I4891 Unspecified atrial fibrillation: Secondary | ICD-10-CM

## 2018-11-04 LAB — CUP PACEART REMOTE DEVICE CHECK
Date Time Interrogation Session: 20200210174015
Implantable Pulse Generator Implant Date: 20190212

## 2018-11-04 NOTE — Telephone Encounter (Signed)
I have faxed request to Dr. Rayann Heman in reference to holding Eliquis prior to procedures.

## 2018-11-06 ENCOUNTER — Telehealth: Payer: Self-pay | Admitting: *Deleted

## 2018-11-06 NOTE — Telephone Encounter (Signed)
Error

## 2018-11-07 ENCOUNTER — Telehealth: Payer: Self-pay | Admitting: Pharmacist

## 2018-11-07 NOTE — Telephone Encounter (Signed)
Noted. Please let patient know.

## 2018-11-07 NOTE — Telephone Encounter (Signed)
Patient aware.

## 2018-11-07 NOTE — Telephone Encounter (Signed)
Received fax from [facility] for clearance of Eliquis.  Patient with diagnosis of Afib on Eliquis for anticoagulation.    Procedure: Colonoscopy and possible EGD Date of procedure: 01/20/19  CHADS2-VASc score of  5 (CHF, HTN, AGE, DM2, stroke/tia x 2, CAD, AGE, female)  CrCl 107ml/min  Per office protocol, patient can hold Eliquis for 2 days prior to procedure.     This has been faxed back to provider [phone].

## 2018-11-07 NOTE — Telephone Encounter (Signed)
Received note back from cardiology, OK to hold Eliquis x 48 hours prior to procedure. Placed note on Linda [name]'s desk.  Also: see cardiology note from today with explanation.

## 2018-11-14 NOTE — Progress Notes (Signed)
Carelink Summary Report / Loop Recorder 

## 2018-12-09 ENCOUNTER — Ambulatory Visit (INDEPENDENT_AMBULATORY_CARE_PROVIDER_SITE_OTHER): Payer: Medicare Other | Admitting: *Deleted

## 2018-12-09 ENCOUNTER — Other Ambulatory Visit: Payer: Self-pay

## 2018-12-09 DIAGNOSIS — I4891 Unspecified atrial fibrillation: Secondary | ICD-10-CM | POA: Diagnosis not present

## 2018-12-09 DIAGNOSIS — I255 Ischemic cardiomyopathy: Secondary | ICD-10-CM

## 2018-12-10 LAB — CUP PACEART REMOTE DEVICE CHECK
Implantable Pulse Generator Implant Date: 20190212
MDC IDC SESS DTM: 20200314180629

## 2018-12-17 NOTE — Progress Notes (Signed)
Carelink Summary Report / Loop Recorder 

## 2018-12-30 ENCOUNTER — Telehealth: Payer: Self-pay | Admitting: Gastroenterology

## 2018-12-30 NOTE — Telephone Encounter (Signed)
[phone]  Please call patient about rescheduling her procedure

## 2018-12-30 NOTE — Telephone Encounter (Signed)
Spoke with patient and she is requesting to r/s procedure. She is now scheduled for 7/14 at 1pm. Patient aware will mail new instructions/pre-op.

## 2019-01-09 ENCOUNTER — Other Ambulatory Visit: Payer: Self-pay

## 2019-01-09 ENCOUNTER — Ambulatory Visit (INDEPENDENT_AMBULATORY_CARE_PROVIDER_SITE_OTHER): Payer: Medicare Other | Admitting: *Deleted

## 2019-01-09 DIAGNOSIS — I4891 Unspecified atrial fibrillation: Secondary | ICD-10-CM | POA: Diagnosis not present

## 2019-01-09 LAB — CUP PACEART REMOTE DEVICE CHECK
Date Time Interrogation Session: 20200416162700
Implantable Pulse Generator Implant Date: 20190212

## 2019-01-15 ENCOUNTER — Other Ambulatory Visit (HOSPITAL_COMMUNITY): Payer: Medicare Other

## 2019-01-15 NOTE — Progress Notes (Signed)
Carelink Summary Report / Loop Recorder 

## 2019-01-31 ENCOUNTER — Other Ambulatory Visit (HOSPITAL_COMMUNITY): Payer: Self-pay | Admitting: Internal Medicine

## 2019-01-31 ENCOUNTER — Telehealth: Payer: Self-pay | Admitting: Internal Medicine

## 2019-01-31 ENCOUNTER — Telehealth: Payer: Self-pay

## 2019-01-31 DIAGNOSIS — Z1231 Encounter for screening mammogram for malignant neoplasm of breast: Secondary | ICD-10-CM

## 2019-01-31 NOTE — Telephone Encounter (Signed)
   Primary Cardiologist: Kate Sable, MD  Chart reviewed as part of pre-operative protocol coverage. Simple dental extractions are considered low risk procedures per guidelines and generally do not require any specific cardiac clearance. It is also generally accepted that for simple extractions and dental cleanings, there is no need to interrupt blood thinner therapy.   SBE prophylaxis is not required for the patient.  I will route this recommendation to the requesting party via Epic fax function and remove from pre-op pool.  Please call with questions.  Abigail Butts, PA-C 01/31/2019, 4:23 PM

## 2019-01-31 NOTE — Telephone Encounter (Signed)
   [facility] Pre-operative Risk Assessment    Request for surgical clearance:  1. What type of surgery is being performed? TBD  2. When is this surgery scheduled? Tooth removal   3. What type of clearance is required (medical clearance vs. Pharmacy clearance to hold med vs. Both)? Pharmacy  4. Are there any medications that need to be held prior to surgery and how long? eliquis   5. Practice name and name of physician performing surgery? Meade Maw DDS  What is your office phone number  [phone]  7.   What is your office fax number no fax number on file  8.   Anesthesia type (None, local, MAC, general) ? None listed     _________________________________________________________________   (provider comments below)

## 2019-01-31 NOTE — Telephone Encounter (Signed)
Returned pts call.  She was checking to see if her dentist office had sent over a clearance for her to have a single tooth extraction on Monday, 02/03/2019. Pt states her dentist office told her she possibly would have to stop her Eliquis a couple of days prior.  Pt was advised that we haven't received anything from them at this time.  Pt was worried, cause the dentist office is closed today and her appt with them is Monday.  Pt was advised that since we couldn't get any information from her dentist re: her procedure, that we couldn't recommend that at this time.  Pt verbalized understanding.

## 2019-01-31 NOTE — Telephone Encounter (Signed)
New message:    Patient calling concerning a appt. Please call patient.

## 2019-02-11 ENCOUNTER — Ambulatory Visit (INDEPENDENT_AMBULATORY_CARE_PROVIDER_SITE_OTHER): Payer: Medicare Other | Admitting: *Deleted

## 2019-02-11 ENCOUNTER — Other Ambulatory Visit: Payer: Self-pay

## 2019-02-11 DIAGNOSIS — I4891 Unspecified atrial fibrillation: Secondary | ICD-10-CM | POA: Diagnosis not present

## 2019-02-12 LAB — CUP PACEART REMOTE DEVICE CHECK
Date Time Interrogation Session: 20200519194007
Implantable Pulse Generator Implant Date: 20190212

## 2019-02-20 NOTE — Progress Notes (Signed)
Carelink Summary Report / Loop Recorder 

## 2019-02-24 DIAGNOSIS — Z299 Encounter for prophylactic measures, unspecified: Secondary | ICD-10-CM | POA: Diagnosis not present

## 2019-02-24 DIAGNOSIS — E1165 Type 2 diabetes mellitus with hyperglycemia: Secondary | ICD-10-CM | POA: Diagnosis not present

## 2019-02-24 DIAGNOSIS — I4891 Unspecified atrial fibrillation: Secondary | ICD-10-CM | POA: Diagnosis not present

## 2019-02-24 DIAGNOSIS — I10 Essential (primary) hypertension: Secondary | ICD-10-CM | POA: Diagnosis not present

## 2019-02-24 DIAGNOSIS — Z6841 Body Mass Index (BMI) 40.0 and over, adult: Secondary | ICD-10-CM | POA: Diagnosis not present

## 2019-02-24 DIAGNOSIS — J449 Chronic obstructive pulmonary disease, unspecified: Secondary | ICD-10-CM | POA: Diagnosis not present

## 2019-02-26 ENCOUNTER — Other Ambulatory Visit: Payer: Self-pay

## 2019-02-26 ENCOUNTER — Inpatient Hospital Stay (HOSPITAL_COMMUNITY): Payer: Medicare Other | Attending: Hematology

## 2019-02-26 DIAGNOSIS — Z8249 Family history of ischemic heart disease and other diseases of the circulatory system: Secondary | ICD-10-CM | POA: Diagnosis not present

## 2019-02-26 DIAGNOSIS — R0602 Shortness of breath: Secondary | ICD-10-CM | POA: Diagnosis not present

## 2019-02-26 DIAGNOSIS — Z83438 Family history of other disorder of lipoprotein metabolism and other lipidemia: Secondary | ICD-10-CM | POA: Diagnosis not present

## 2019-02-26 DIAGNOSIS — M7989 Other specified soft tissue disorders: Secondary | ICD-10-CM | POA: Insufficient documentation

## 2019-02-26 DIAGNOSIS — Z87891 Personal history of nicotine dependence: Secondary | ICD-10-CM | POA: Diagnosis not present

## 2019-02-26 DIAGNOSIS — I4891 Unspecified atrial fibrillation: Secondary | ICD-10-CM | POA: Diagnosis not present

## 2019-02-26 DIAGNOSIS — Z825 Family history of asthma and other chronic lower respiratory diseases: Secondary | ICD-10-CM | POA: Diagnosis not present

## 2019-02-26 DIAGNOSIS — R51 Headache: Secondary | ICD-10-CM | POA: Diagnosis not present

## 2019-02-26 DIAGNOSIS — D509 Iron deficiency anemia, unspecified: Secondary | ICD-10-CM | POA: Diagnosis not present

## 2019-02-26 DIAGNOSIS — R5383 Other fatigue: Secondary | ICD-10-CM | POA: Insufficient documentation

## 2019-02-26 DIAGNOSIS — Z811 Family history of alcohol abuse and dependence: Secondary | ICD-10-CM | POA: Diagnosis not present

## 2019-02-26 DIAGNOSIS — Z8261 Family history of arthritis: Secondary | ICD-10-CM | POA: Insufficient documentation

## 2019-02-26 DIAGNOSIS — Z9049 Acquired absence of other specified parts of digestive tract: Secondary | ICD-10-CM | POA: Insufficient documentation

## 2019-02-26 DIAGNOSIS — Z818 Family history of other mental and behavioral disorders: Secondary | ICD-10-CM | POA: Insufficient documentation

## 2019-02-26 DIAGNOSIS — Z833 Family history of diabetes mellitus: Secondary | ICD-10-CM | POA: Diagnosis not present

## 2019-02-26 DIAGNOSIS — D500 Iron deficiency anemia secondary to blood loss (chronic): Secondary | ICD-10-CM

## 2019-02-26 LAB — COMPREHENSIVE METABOLIC PANEL
ALT: 20 U/L (ref 0–44)
AST: 28 U/L (ref 15–41)
Albumin: 3.4 g/dL — ABNORMAL LOW (ref 3.5–5.0)
Alkaline Phosphatase: 87 U/L (ref 38–126)
Anion gap: 12 (ref 5–15)
BUN: 17 mg/dL (ref 8–23)
CO2: 26 mmol/L (ref 22–32)
Calcium: 9.5 mg/dL (ref 8.9–10.3)
Chloride: 101 mmol/L (ref 98–111)
Creatinine, Ser: 0.95 mg/dL (ref 0.44–1.00)
GFR calc Af Amer: >60 mL/min (ref >60)
GFR calc non Af Amer: >60 mL/min (ref >60)
Glucose, Bld: 164 mg/dL — ABNORMAL HIGH (ref 70–99)
Potassium: 3.9 mmol/L (ref 3.5–5.1)
Sodium: 139 mmol/L (ref 135–145)
Total Bilirubin: 1.4 mg/dL — ABNORMAL HIGH (ref 0.3–1.2)
Total Protein: 6.8 g/dL (ref 6.5–8.1)

## 2019-02-26 LAB — CBC WITH DIFFERENTIAL/PLATELET
Abs Immature Granulocytes: 0.02 10*3/uL (ref 0.00–0.07)
Basophils Absolute: 0 10*3/uL (ref 0.0–0.1)
Basophils Relative: 0 %
Eosinophils Absolute: 0.2 10*3/uL (ref 0.0–0.5)
Eosinophils Relative: 3 %
HCT: 38.5 % (ref 36.0–46.0)
Hemoglobin: 12.1 g/dL (ref 12.0–15.0)
Immature Granulocytes: 0 %
Lymphocytes Relative: 20 %
Lymphs Abs: 1.1 10*3/uL (ref 0.7–4.0)
MCH: 31.2 pg (ref 26.0–34.0)
MCHC: 31.4 g/dL (ref 30.0–36.0)
MCV: 99.2 fL (ref 80.0–100.0)
Monocytes Absolute: 0.4 10*3/uL (ref 0.1–1.0)
Monocytes Relative: 7 %
Neutro Abs: 3.8 10*3/uL (ref 1.7–7.7)
Neutrophils Relative %: 70 %
Platelets: 119 10*3/uL — ABNORMAL LOW (ref 150–400)
RBC: 3.88 MIL/uL (ref 3.87–5.11)
RDW: 13.9 % (ref 11.5–15.5)
WBC: 5.5 10*3/uL (ref 4.0–10.5)
nRBC: 0 % (ref 0.0–0.2)

## 2019-02-26 LAB — LACTATE DEHYDROGENASE: LDH: 138 U/L (ref 98–192)

## 2019-02-26 LAB — FERRITIN: Ferritin: 13 ng/mL (ref 11–307)

## 2019-02-26 NOTE — Progress Notes (Signed)
For review.  Please update ordering provider

## 2019-03-04 ENCOUNTER — Other Ambulatory Visit: Payer: Self-pay

## 2019-03-05 ENCOUNTER — Inpatient Hospital Stay (HOSPITAL_BASED_OUTPATIENT_CLINIC_OR_DEPARTMENT_OTHER): Payer: Medicare Other | Admitting: Hematology

## 2019-03-05 ENCOUNTER — Encounter (HOSPITAL_COMMUNITY): Payer: Self-pay | Admitting: Hematology

## 2019-03-05 VITALS — BP 147/62 | HR 71 | Temp 98.6°F | Resp 18 | Wt 233.4 lb

## 2019-03-05 DIAGNOSIS — Z833 Family history of diabetes mellitus: Secondary | ICD-10-CM

## 2019-03-05 DIAGNOSIS — Z9049 Acquired absence of other specified parts of digestive tract: Secondary | ICD-10-CM | POA: Diagnosis not present

## 2019-03-05 DIAGNOSIS — R0602 Shortness of breath: Secondary | ICD-10-CM | POA: Diagnosis not present

## 2019-03-05 DIAGNOSIS — Z8249 Family history of ischemic heart disease and other diseases of the circulatory system: Secondary | ICD-10-CM

## 2019-03-05 DIAGNOSIS — R51 Headache: Secondary | ICD-10-CM

## 2019-03-05 DIAGNOSIS — Z8261 Family history of arthritis: Secondary | ICD-10-CM

## 2019-03-05 DIAGNOSIS — Z825 Family history of asthma and other chronic lower respiratory diseases: Secondary | ICD-10-CM

## 2019-03-05 DIAGNOSIS — R5383 Other fatigue: Secondary | ICD-10-CM | POA: Diagnosis not present

## 2019-03-05 DIAGNOSIS — M7989 Other specified soft tissue disorders: Secondary | ICD-10-CM | POA: Diagnosis not present

## 2019-03-05 DIAGNOSIS — Z83438 Family history of other disorder of lipoprotein metabolism and other lipidemia: Secondary | ICD-10-CM

## 2019-03-05 DIAGNOSIS — D500 Iron deficiency anemia secondary to blood loss (chronic): Secondary | ICD-10-CM

## 2019-03-05 DIAGNOSIS — Z87891 Personal history of nicotine dependence: Secondary | ICD-10-CM | POA: Diagnosis not present

## 2019-03-05 DIAGNOSIS — Z811 Family history of alcohol abuse and dependence: Secondary | ICD-10-CM

## 2019-03-05 DIAGNOSIS — Z818 Family history of other mental and behavioral disorders: Secondary | ICD-10-CM | POA: Diagnosis not present

## 2019-03-05 DIAGNOSIS — D509 Iron deficiency anemia, unspecified: Secondary | ICD-10-CM | POA: Diagnosis not present

## 2019-03-05 DIAGNOSIS — I4891 Unspecified atrial fibrillation: Secondary | ICD-10-CM | POA: Diagnosis not present

## 2019-03-05 NOTE — Patient Instructions (Addendum)
[place] at [facility] Discharge Instructions  You were seen today by Dr. Delton Coombes. He went over your recent lab results. He will get you scheduled for IV iron. He will see you back in 3 months for labs and follow up.   Thank you for choosing [place] at [facility] to provide your oncology and hematology care.  To afford each patient quality time with our provider, please arrive at least 15 minutes before your scheduled appointment time.   If you have a lab appointment with the [facility] please come in thru the  Main Entrance and check in at the main information desk  You need to re-schedule your appointment should you arrive 10 or more minutes late.  We strive to give you quality time with our providers, and arriving late affects you and other patients whose appointments are after yours.  Also, if you no show three or more times for appointments you may be dismissed from the clinic at the providers discretion.     Again, thank you for choosing [facility].  Our hope is that these requests will decrease the amount of time that you wait before being seen by our physicians.       _____________________________________________________________  Should you have questions after your visit to [facility], please contact our office at [phone] between the hours of 8:00 a.m. and 4:30 p.m.  Voicemails left after 4:00 p.m. will not be returned until the following business day.  For prescription refill requests, have your pharmacy contact our office and allow 72 hours.    Cancer Center Support Programs:   > Cancer Support Group  2nd Tuesday of the month 1pm-2pm, Journey Room

## 2019-03-05 NOTE — Assessment & Plan Note (Signed)
1.  Iron deficiency anemia: - She denies any bleeding per rectum or melena. -She is scheduled for colonoscopy next month by Dr. Oneida Alar. - Last Feraheme was on 09/03/2018 and 09/10/2018. - We reviewed her blood work.  Hemoglobin is 12.1.  Ferritin has come down to 13 from 42 in January. -She complains of feeling very tired.  Hence I have recommended 2 infusions of Feraheme. -We will see her back in 3 months for follow-up with repeat blood work.  2.  Atrial fibrillation: -She continues to be on Eliquis twice daily.

## 2019-03-05 NOTE — Progress Notes (Signed)
[address] [place], [place] [address]   CLINIC:  Medical Oncology/Hematology  PCP:  [name], MD [place] [address] [phone]   REASON FOR VISIT:  Follow-up for iron deficiency anemia.     INTERVAL HISTORY:  Ms. Harper 73 y.o. female for follow-up of iron deficiency anemia.  Denies any bleeding per rectum or melena.  Last received Feraheme on 09/10/2018.  She had increased energy levels which lasted few weeks.  Now she is feeling tired again.  Appetite is 100%.  Energy levels are 0.  She complains of shortness of breath on exertion.  Leg swellings are stable.  Denies any fevers or infections.  Denies any ER visits or hospitalizations.    REVIEW OF SYSTEMS:  Review of Systems  Constitutional: Positive for fatigue.  Respiratory: Positive for shortness of breath.   Cardiovascular: Positive for leg swelling.  Neurological: Positive for headaches.  All other systems reviewed and are negative.    PAST MEDICAL/SURGICAL HISTORY:  Past Medical History:  Diagnosis Date  . Allergy   . Anemia 07/25/2017  . Anxiety   . Arthritis   . Atrial fibrillation ([place])   . Cataract   . COPD (chronic obstructive pulmonary disease) ([place])   . Depression   . Diabetes mellitus without complication ([place])   . Emphysema of lung ([place])   . Hyperlipidemia   . Hypertension   . Oxygen deficiency   . Sleep apnea   . Typical atrial flutter [facility])    Past Surgical History:  Procedure Laterality Date  . A-FLUTTER ABLATION N/A 10/04/2017   Procedure: A-FLUTTER ABLATION;  Surgeon: Thompson Grayer, MD;  Location: [facility];  Service: Cardiovascular;  Laterality: N/A;  . ABDOMINAL HYSTERECTOMY     fibroids  . blood clot removal from base of brain  1990  . CARPAL TUNNEL RELEASE    . CATARACT EXTRACTION W/PHACO Left 06/16/2013   Procedure: CATARACT EXTRACTION PHACO AND INTRAOCULAR LENS PLACEMENT (IOC);  Surgeon: Tonny Branch, MD;  Location: [facility];   Service: Ophthalmology;  Laterality: Left;  CDE:  12.18  . CATARACT EXTRACTION W/PHACO Right 06/26/2013   Procedure: CATARACT EXTRACTION PHACO AND INTRAOCULAR LENS PLACEMENT (IOC);  Surgeon: Tonny Branch, MD;  Location: [facility];  Service: Ophthalmology;  Laterality: Right;  CDE:14.71  . CESAREAN SECTION    . CHOLECYSTECTOMY    . DILATION AND CURETTAGE OF UTERUS    . EYE SURGERY  12/2016  . FOOT NEUROMA SURGERY Right   . LOOP RECORDER INSERTION N/A 11/06/2017   Procedure: LOOP RECORDER INSERTION;  Surgeon: Thompson Grayer, MD;  Location: [facility];  Service: Cardiovascular;  Laterality: N/A;  . MOUTH SURGERY    . TONSILLECTOMY       SOCIAL HISTORY:  Social History   Socioeconomic History  . Marital status: Widowed    Spouse name: Not on file  . Number of children: 1  . Years of education: 82  . Highest education level: Not on file  Occupational History  . Occupation: retired    Comment: catering, [employer]  . Financial resource strain: Not on file  . Food insecurity:    Worry: Not on file    Inability: Not on file  . Transportation needs:    Medical: Not on file    Non-medical: Not on file  Tobacco Use  . Smoking status: Former Smoker    Packs/day: 1.50    Types: Cigarettes  Start date: 09/25/1958    Last attempt to quit: 06/06/2001    Years since quitting: 17.7  . Smokeless tobacco: Never Used  Substance and Sexual Activity  . Alcohol use: No  . Drug use: No  . Sexual activity: Not Currently  Lifestyle  . Physical activity:    Days per week: Not on file    Minutes per session: Not on file  . Stress: Not on file  Relationships  . Social connections:    Talks on phone: Not on file    Gets together: Not on file    Attends religious service: Not on file    Active member of club or organization: Not on file    Attends meetings of clubs or organizations: Not on file    Relationship status: Not on file  . Intimate partner violence:    Fear of current  or ex partner: Not on file    Emotionally abused: Not on file    Physically abused: Not on file    Forced sexual activity: Not on file  Other Topics Concern  . Not on file  Social History Narrative   Lives alone   Son lives in [place]   TV, reads, puzzles    FAMILY HISTORY:  Family History  Problem Relation Age of Onset  . Emphysema Mother   . Alcohol abuse Mother   . Arthritis Mother   . COPD Mother   . Depression Mother   . Hyperlipidemia Mother   . Hypertension Mother   . Heart attack Father   . Cancer Father        lung  . Heart disease Father   . Diabetes Brother   . COPD Brother   . Other Maternal Grandmother        swine flu 1919  . Colon cancer Neg Hx   . Colon polyps Neg Hx     CURRENT MEDICATIONS:  Outpatient Encounter Medications as of 03/05/2019  Medication Sig  . acetaminophen (TYLENOL) 500 MG tablet Take 1,000 mg by mouth every 6 (six) hours as needed for moderate pain or headache.   . Alpha-Lipoic Acid 200 MG TABS Take by mouth 2 (two) times daily.  Marland Kitchen apixaban (ELIQUIS) 5 MG TABS tablet Take 1 tablet (5 mg total) by mouth 2 (two) times daily.  . Cholecalciferol (VITAMIN D) 50 MCG (2000 UT) CAPS Take 2,000 Units by mouth daily.   . DULoxetine (CYMBALTA) 30 MG capsule Take 30 mg by mouth daily.  . Fluticasone-Salmeterol (ADVAIR DISKUS) 250-50 MCG/DOSE AEPB Inhale 1 puff into the lungs 2 (two) times daily.  Marland Kitchen glucose blood (ONETOUCH VERIO) test strip Use as instructed  . guaiFENesin (MUCINEX) 600 MG 12 hr tablet Take 600 mg by mouth daily.   Marland Kitchen loratadine (CLARITIN) 10 MG tablet Take 10 mg by mouth daily.  . Melatonin 10 MG TABS Take 1 tablet by mouth at bedtime.  . metFORMIN (GLUCOPHAGE) 500 MG tablet Take 500 mg by mouth daily.   . metoprolol succinate (TOPROL-XL) 50 MG 24 hr tablet Take 50 mg by mouth 2 (two) times daily. Take with or immediately following a meal.  . Multiple Vitamin (MULTIVITAMIN) tablet Take 1 tablet by mouth daily.  . OXYGEN  Inhale 2 L into the lungs at bedtime.   . polyethylene glycol (MIRALAX / GLYCOLAX) packet Take 17 g by mouth daily as needed.  . pravastatin (PRAVACHOL) 20 MG tablet Take 1 tablet (20 mg total) daily by mouth. (Patient taking differently: Take 20  mg by mouth at bedtime. )  . PROAIR HFA 108 (90 Base) MCG/ACT inhaler USE 1 TO 2 INHALATIONS EVERY 6 HOURS AS NEEDED FOR WHEEZING OR SHORTNESS OF BREATH (Patient taking differently: Inhale 1-2 puffs into the lungs every 6 (six) hours as needed for wheezing or shortness of breath. )  . Probiotic Product (PROBIOTIC DAILY PO) Take by mouth daily.  . pseudoephedrine (SUDAFED) 120 MG 12 hr tablet Take 120 mg by mouth daily.  Marland Kitchen tiotropium (SPIRIVA) 18 MCG inhalation capsule Place 18 mcg into inhaler and inhale daily.  . vitamin B-12 (CYANOCOBALAMIN) 1000 MCG tablet Take 1,000 mcg by mouth daily.   No facility-administered encounter medications on file as of 03/05/2019.     ALLERGIES:  Allergies  Allergen Reactions  . Other Swelling and Other (See Comments)    Local arm swelling- TB skin Test  . Trazodone Hives and Other (See Comments)    "blisters my skin"   . Trazodone And Nefazodone Hives and Other (See Comments)    "blisters my skin"   . Nickel Other (See Comments)    "oozing"  . Tetanus Toxoids Other (See Comments)    Left a knot  . Adhesive [Tape] Itching  . Bactrim [Sulfamethoxazole-Trimethoprim] Itching and Rash  . Penicillins Itching, Rash and Other (See Comments)    Has patient had a PCN reaction causing immediate rash, facial/tongue/throat swelling, SOB or lightheadedness with hypotension: Unknown Has patient had a PCN reaction causing severe rash involving mucus membranes or skin necrosis: Unknown Has patient had a PCN reaction that required hospitalization: No Has patient had a PCN reaction occurring within the last 10 years: No If all of the above answers are "NO", then may proceed with Cephalosporin use  . Ppd [Tuberculin Purified  Protein Derivative] Swelling and Other (See Comments)    Local arm swelling  . Sulfamethoxazole-Trimethoprim Itching and Rash     PHYSICAL EXAM:  ECOG Performance status: 1  Vitals:   03/05/19 1459  BP: (!) 147/62  Pulse: 71  Resp: 18  Temp: 98.6 F (37 C)  SpO2: 94%   Filed Weights   03/05/19 1459  Weight: 233 lb 6.4 oz (105.9 kg)    Physical Exam Vitals signs reviewed.  Constitutional:      Appearance: Normal appearance.  Cardiovascular:     Rate and Rhythm: Normal rate and regular rhythm.     Heart sounds: Normal heart sounds.  Pulmonary:     Effort: Pulmonary effort is normal.     Breath sounds: Normal breath sounds.  Abdominal:     General: There is no distension.     Palpations: Abdomen is soft. There is no mass.  Skin:    General: Skin is warm.  Neurological:     General: No focal deficit present.     Mental Status: She is alert and oriented to person, place, and time.  Psychiatric:        Mood and Affect: Mood normal.        Behavior: Behavior normal.      LABORATORY DATA:  I have reviewed the labs as listed.  CBC    Component Value Date/Time   WBC 5.5 02/26/2019 1207   RBC 3.88 02/26/2019 1207   HGB 12.1 02/26/2019 1207   HGB 11.2 09/24/2017 1550   HCT 38.5 02/26/2019 1207   HCT 38.1 09/24/2017 1550   PLT 119 (L) 02/26/2019 1207   PLT 155 09/24/2017 1550   MCV 99.2 02/26/2019 1207   MCV 91 09/24/2017  [place] 31.2 02/26/2019 1207   MCHC 31.4 02/26/2019 1207   RDW 13.9 02/26/2019 1207   RDW 18.5 (H) 09/24/2017 1550   LYMPHSABS 1.1 02/26/2019 1207   LYMPHSABS 1.3 09/24/2017 1550   MONOABS 0.4 02/26/2019 1207   EOSABS 0.2 02/26/2019 1207   EOSABS 0.1 09/24/2017 1550   BASOSABS 0.0 02/26/2019 1207   BASOSABS 0.0 09/24/2017 1550   CMP Latest Ref Rng & Units 02/26/2019 10/18/2018 08/30/2018  Glucose 70 - 99 mg/dL 164(H) 137(H) 196(H)  BUN 8 - 23 mg/dL 17 20 16   Creatinine 0.44 - 1.00 mg/dL 0.95 0.88 0.88  Sodium 135 - 145 mmol/L 139 139  139  Potassium 3.5 - 5.1 mmol/L 3.9 4.3 4.0  Chloride 98 - 111 mmol/L 101 99 103  CO2 22 - 32 mmol/L 26 33(H) 27  Calcium 8.9 - 10.3 mg/dL 9.5 10.4(H) 9.5  Total Protein 6.5 - 8.1 g/dL 6.8 7.9 7.2  Total Bilirubin 0.3 - 1.2 mg/dL 1.4(H) 1.6(H) 1.5(H)  Alkaline Phos 38 - 126 U/L 87 95 75  AST 15 - 41 U/L 28 34 25  ALT 0 - 44 U/L 20 23 14        DIAGNOSTIC IMAGING:  I have independently reviewed the scans and discussed with the patient.   I have reviewed Venita Lick LPN's note and agree with the documentation.  I personally performed a face-to-face visit, made revisions and my assessment and plan is as follows.    ASSESSMENT & PLAN:   IDA (iron deficiency anemia) 1.  Iron deficiency anemia: - She denies any bleeding per rectum or melena. -She is scheduled for colonoscopy next month by Dr. Oneida Alar. - Last Feraheme was on 09/03/2018 and 09/10/2018. - We reviewed her blood work.  Hemoglobin is 12.1.  Ferritin has come down to 13 from 42 in January. -She complains of feeling very tired.  Hence I have recommended 2 infusions of Feraheme. -We will see her back in 3 months for follow-up with repeat blood work.  2.  Atrial fibrillation: -She continues to be on Eliquis twice daily.      Orders placed this encounter:  Orders Placed This Encounter  Procedures  . CBC with Differential/Platelet  . Comprehensive metabolic panel  . Iron and TIBC  . Ferritin      Derek Jack, MD [address] [phone]

## 2019-03-06 ENCOUNTER — Other Ambulatory Visit: Payer: Self-pay

## 2019-03-07 ENCOUNTER — Inpatient Hospital Stay (HOSPITAL_COMMUNITY): Payer: Medicare Other

## 2019-03-07 ENCOUNTER — Encounter (HOSPITAL_COMMUNITY): Payer: Self-pay

## 2019-03-07 VITALS — BP 150/57 | HR 65 | Temp 97.8°F | Resp 18

## 2019-03-07 DIAGNOSIS — D509 Iron deficiency anemia, unspecified: Secondary | ICD-10-CM | POA: Diagnosis not present

## 2019-03-07 DIAGNOSIS — I4891 Unspecified atrial fibrillation: Secondary | ICD-10-CM | POA: Diagnosis not present

## 2019-03-07 DIAGNOSIS — M7989 Other specified soft tissue disorders: Secondary | ICD-10-CM | POA: Diagnosis not present

## 2019-03-07 DIAGNOSIS — R51 Headache: Secondary | ICD-10-CM | POA: Diagnosis not present

## 2019-03-07 DIAGNOSIS — R0602 Shortness of breath: Secondary | ICD-10-CM | POA: Diagnosis not present

## 2019-03-07 DIAGNOSIS — D500 Iron deficiency anemia secondary to blood loss (chronic): Secondary | ICD-10-CM

## 2019-03-07 DIAGNOSIS — R5383 Other fatigue: Secondary | ICD-10-CM | POA: Diagnosis not present

## 2019-03-07 MED ORDER — SODIUM CHLORIDE 0.9 % IV SOLN
510.0000 mg | Freq: Once | INTRAVENOUS | Status: AC
  Administered 2019-03-07: 510 mg via INTRAVENOUS
  Filled 2019-03-07: qty 17

## 2019-03-07 MED ORDER — SODIUM CHLORIDE 0.9 % IV SOLN
INTRAVENOUS | Status: DC
  Administered 2019-03-07: 11:00:00 via INTRAVENOUS

## 2019-03-07 NOTE — Progress Notes (Signed)
Feraheme given per orders. Patient tolerated it well without problems. Vitals stable and discharged home from clinic ambulatory. Follow up as scheduled.  

## 2019-03-07 NOTE — Patient Instructions (Signed)
[facility] at [facility]  Discharge Instructions:   _______________________________________________________________  Thank you for choosing [facility] at [facility] to provide your oncology and hematology care.  To afford each patient quality time with our providers, please arrive at least 15 minutes before your scheduled appointment.  You need to re-schedule your appointment if you arrive 10 or more minutes late.  We strive to give you quality time with our providers, and arriving late affects you and other patients whose appointments are after yours.  Also, if you no show three or more times for appointments you may be dismissed from the clinic.  Again, thank you for choosing [facility] at [facility]. Our hope is that these requests will allow you access to exceptional care and in a timely manner. _______________________________________________________________  If you have questions after your visit, please contact our office at [phone] between the hours of 8:30 a.m. and 5:00 p.m. Voicemails left after 4:30 p.m. will not be returned until the following business day. _______________________________________________________________  For prescription refill requests, have your pharmacy contact our office. _______________________________________________________________  Recommendations made by the consultant and any test results will be sent to your referring physician. _______________________________________________________________ 

## 2019-03-17 ENCOUNTER — Encounter (HOSPITAL_COMMUNITY): Payer: Self-pay

## 2019-03-17 ENCOUNTER — Inpatient Hospital Stay (HOSPITAL_COMMUNITY): Payer: Medicare Other

## 2019-03-17 ENCOUNTER — Ambulatory Visit (INDEPENDENT_AMBULATORY_CARE_PROVIDER_SITE_OTHER): Payer: Medicare Other | Admitting: *Deleted

## 2019-03-17 ENCOUNTER — Other Ambulatory Visit: Payer: Self-pay

## 2019-03-17 VITALS — BP 133/64 | HR 65 | Temp 98.3°F | Resp 20

## 2019-03-17 DIAGNOSIS — M7989 Other specified soft tissue disorders: Secondary | ICD-10-CM | POA: Diagnosis not present

## 2019-03-17 DIAGNOSIS — I4891 Unspecified atrial fibrillation: Secondary | ICD-10-CM

## 2019-03-17 DIAGNOSIS — D509 Iron deficiency anemia, unspecified: Secondary | ICD-10-CM | POA: Diagnosis not present

## 2019-03-17 DIAGNOSIS — R5383 Other fatigue: Secondary | ICD-10-CM | POA: Diagnosis not present

## 2019-03-17 DIAGNOSIS — D500 Iron deficiency anemia secondary to blood loss (chronic): Secondary | ICD-10-CM

## 2019-03-17 DIAGNOSIS — R0602 Shortness of breath: Secondary | ICD-10-CM | POA: Diagnosis not present

## 2019-03-17 DIAGNOSIS — R51 Headache: Secondary | ICD-10-CM | POA: Diagnosis not present

## 2019-03-17 LAB — CUP PACEART REMOTE DEVICE CHECK
Date Time Interrogation Session: 20200621193959
Implantable Pulse Generator Implant Date: 20190212

## 2019-03-17 MED ORDER — SODIUM CHLORIDE 0.9 % IV SOLN
INTRAVENOUS | Status: DC
  Administered 2019-03-17: 13:00:00 via INTRAVENOUS

## 2019-03-17 MED ORDER — SODIUM CHLORIDE 0.9 % IV SOLN
510.0000 mg | Freq: Once | INTRAVENOUS | Status: AC
  Administered 2019-03-17: 510 mg via INTRAVENOUS
  Filled 2019-03-17: qty 510

## 2019-03-17 MED ORDER — SODIUM CHLORIDE 0.9% FLUSH
10.0000 mL | Freq: Once | INTRAVENOUS | Status: AC
  Administered 2019-03-17: 10 mL via INTRAVENOUS

## 2019-03-17 NOTE — Progress Notes (Signed)
Feraheme given today per MD orders. Tolerated infusion without adverse affects. Vital signs stable. No complaints at this time. Discharged from clinic ambulatory. F/U with [facility] as scheduled.  

## 2019-03-25 NOTE — Progress Notes (Signed)
Carelink Summary Report / Loop Recorder 

## 2019-04-01 ENCOUNTER — Other Ambulatory Visit: Payer: Self-pay

## 2019-04-02 ENCOUNTER — Encounter (HOSPITAL_COMMUNITY)
Admission: RE | Admit: 2019-04-02 | Discharge: 2019-04-02 | Disposition: A | Discharge status: Home / Self Care | Payer: Medicare Other | Source: Ambulatory Visit | Attending: Gastroenterology | Admitting: Gastroenterology

## 2019-04-02 ENCOUNTER — Other Ambulatory Visit: Payer: Self-pay

## 2019-04-04 ENCOUNTER — Other Ambulatory Visit (HOSPITAL_COMMUNITY)
Admission: RE | Admit: 2019-04-04 | Discharge: 2019-04-04 | Disposition: A | Discharge status: Home / Self Care | Payer: Medicare Other | Source: Ambulatory Visit | Attending: Gastroenterology | Admitting: Gastroenterology

## 2019-04-04 ENCOUNTER — Other Ambulatory Visit: Payer: Self-pay

## 2019-04-04 DIAGNOSIS — Z1159 Encounter for screening for other viral diseases: Secondary | ICD-10-CM | POA: Insufficient documentation

## 2019-04-04 DIAGNOSIS — Z01812 Encounter for preprocedural laboratory examination: Secondary | ICD-10-CM | POA: Diagnosis not present

## 2019-04-04 LAB — SARS CORONAVIRUS 2 (TAT 6-24 HRS): SARS Coronavirus 2: NEGATIVE

## 2019-04-07 NOTE — Progress Notes (Signed)
CC'D TO PCP °

## 2019-04-08 ENCOUNTER — Ambulatory Visit (HOSPITAL_COMMUNITY): Payer: Medicare Other | Admitting: Anesthesiology

## 2019-04-08 ENCOUNTER — Encounter (HOSPITAL_COMMUNITY)
Admission: RE | Disposition: A | Discharge status: Home / Self Care | Payer: Self-pay | Source: Ambulatory Visit | Attending: Gastroenterology

## 2019-04-08 ENCOUNTER — Ambulatory Visit (HOSPITAL_COMMUNITY)
Admission: RE | Admit: 2019-04-08 | Discharge: 2019-04-08 | Disposition: A | Discharge status: Home / Self Care | Payer: Medicare Other | Source: Ambulatory Visit | Attending: Gastroenterology | Admitting: Gastroenterology

## 2019-04-08 ENCOUNTER — Other Ambulatory Visit: Payer: Self-pay

## 2019-04-08 ENCOUNTER — Encounter (HOSPITAL_COMMUNITY): Payer: Self-pay

## 2019-04-08 DIAGNOSIS — D124 Benign neoplasm of descending colon: Secondary | ICD-10-CM | POA: Diagnosis not present

## 2019-04-08 DIAGNOSIS — F329 Major depressive disorder, single episode, unspecified: Secondary | ICD-10-CM | POA: Insufficient documentation

## 2019-04-08 DIAGNOSIS — K5730 Diverticulosis of large intestine without perforation or abscess without bleeding: Secondary | ICD-10-CM | POA: Insufficient documentation

## 2019-04-08 DIAGNOSIS — Z7984 Long term (current) use of oral hypoglycemic drugs: Secondary | ICD-10-CM | POA: Insufficient documentation

## 2019-04-08 DIAGNOSIS — K644 Residual hemorrhoidal skin tags: Secondary | ICD-10-CM | POA: Diagnosis not present

## 2019-04-08 DIAGNOSIS — I10 Essential (primary) hypertension: Secondary | ICD-10-CM | POA: Insufficient documentation

## 2019-04-08 DIAGNOSIS — F419 Anxiety disorder, unspecified: Secondary | ICD-10-CM | POA: Diagnosis not present

## 2019-04-08 DIAGNOSIS — E119 Type 2 diabetes mellitus without complications: Secondary | ICD-10-CM | POA: Insufficient documentation

## 2019-04-08 DIAGNOSIS — G4730 Sleep apnea, unspecified: Secondary | ICD-10-CM | POA: Insufficient documentation

## 2019-04-08 DIAGNOSIS — D123 Benign neoplasm of transverse colon: Secondary | ICD-10-CM | POA: Diagnosis not present

## 2019-04-08 DIAGNOSIS — E785 Hyperlipidemia, unspecified: Secondary | ICD-10-CM | POA: Diagnosis not present

## 2019-04-08 DIAGNOSIS — Q438 Other specified congenital malformations of intestine: Secondary | ICD-10-CM | POA: Diagnosis not present

## 2019-04-08 DIAGNOSIS — B9681 Helicobacter pylori [H. pylori] as the cause of diseases classified elsewhere: Secondary | ICD-10-CM | POA: Diagnosis not present

## 2019-04-08 DIAGNOSIS — I2510 Atherosclerotic heart disease of native coronary artery without angina pectoris: Secondary | ICD-10-CM | POA: Insufficient documentation

## 2019-04-08 DIAGNOSIS — Z7951 Long term (current) use of inhaled steroids: Secondary | ICD-10-CM | POA: Diagnosis not present

## 2019-04-08 DIAGNOSIS — D122 Benign neoplasm of ascending colon: Secondary | ICD-10-CM | POA: Insufficient documentation

## 2019-04-08 DIAGNOSIS — Z7901 Long term (current) use of anticoagulants: Secondary | ICD-10-CM | POA: Insufficient documentation

## 2019-04-08 DIAGNOSIS — K2970 Gastritis, unspecified, without bleeding: Secondary | ICD-10-CM | POA: Diagnosis not present

## 2019-04-08 DIAGNOSIS — Z79899 Other long term (current) drug therapy: Secondary | ICD-10-CM | POA: Diagnosis not present

## 2019-04-08 DIAGNOSIS — K2950 Unspecified chronic gastritis without bleeding: Secondary | ICD-10-CM | POA: Insufficient documentation

## 2019-04-08 DIAGNOSIS — D509 Iron deficiency anemia, unspecified: Secondary | ICD-10-CM | POA: Diagnosis not present

## 2019-04-08 DIAGNOSIS — K635 Polyp of colon: Secondary | ICD-10-CM | POA: Diagnosis not present

## 2019-04-08 DIAGNOSIS — J439 Emphysema, unspecified: Secondary | ICD-10-CM | POA: Insufficient documentation

## 2019-04-08 DIAGNOSIS — I4891 Unspecified atrial fibrillation: Secondary | ICD-10-CM | POA: Diagnosis not present

## 2019-04-08 DIAGNOSIS — Z87891 Personal history of nicotine dependence: Secondary | ICD-10-CM | POA: Diagnosis not present

## 2019-04-08 DIAGNOSIS — K648 Other hemorrhoids: Secondary | ICD-10-CM | POA: Insufficient documentation

## 2019-04-08 DIAGNOSIS — M1990 Unspecified osteoarthritis, unspecified site: Secondary | ICD-10-CM | POA: Insufficient documentation

## 2019-04-08 DIAGNOSIS — D125 Benign neoplasm of sigmoid colon: Secondary | ICD-10-CM | POA: Diagnosis not present

## 2019-04-08 HISTORY — PX: POLYPECTOMY: SHX5525

## 2019-04-08 HISTORY — PX: ESOPHAGOGASTRODUODENOSCOPY (EGD) WITH PROPOFOL: SHX5813

## 2019-04-08 HISTORY — PX: COLONOSCOPY WITH PROPOFOL: SHX5780

## 2019-04-08 LAB — GLUCOSE, CAPILLARY: Glucose-Capillary: 140 mg/dL — ABNORMAL HIGH (ref 70–99)

## 2019-04-08 SURGERY — COLONOSCOPY WITH PROPOFOL
Anesthesia: General

## 2019-04-08 MED ORDER — PROPOFOL 10 MG/ML IV BOLUS
INTRAVENOUS | Status: AC
  Filled 2019-04-08: qty 20

## 2019-04-08 MED ORDER — PROPOFOL 500 MG/50ML IV EMUL
INTRAVENOUS | Status: DC | PRN
  Administered 2019-04-08 (×2): via INTRAVENOUS
  Administered 2019-04-08: 150 ug/kg/min via INTRAVENOUS

## 2019-04-08 MED ORDER — PROPOFOL 10 MG/ML IV BOLUS
INTRAVENOUS | Status: DC | PRN
  Administered 2019-04-08 (×2): 20 mg via INTRAVENOUS

## 2019-04-08 MED ORDER — PROPOFOL 10 MG/ML IV BOLUS
INTRAVENOUS | Status: AC
  Filled 2019-04-08: qty 40

## 2019-04-08 MED ORDER — KETAMINE HCL 50 MG/5ML IJ SOSY
PREFILLED_SYRINGE | INTRAMUSCULAR | Status: AC
  Filled 2019-04-08: qty 5

## 2019-04-08 MED ORDER — OMEPRAZOLE 20 MG PO CPDR: DELAYED_RELEASE_CAPSULE | ORAL | 3 refills | Status: DC

## 2019-04-08 MED ORDER — CHLORHEXIDINE GLUCONATE CLOTH 2 % EX PADS: 6.0000 | MEDICATED_PAD | Freq: Once | CUTANEOUS | Status: DC

## 2019-04-08 MED ORDER — KETAMINE HCL 10 MG/ML IJ SOLN
INTRAMUSCULAR | Status: DC | PRN
  Administered 2019-04-08 (×3): 5 mg via INTRAVENOUS
  Administered 2019-04-08: 10 mg via INTRAVENOUS

## 2019-04-08 MED ORDER — LACTATED RINGERS IV SOLN
INTRAVENOUS | Status: DC
  Administered 2019-04-08 (×2): via INTRAVENOUS

## 2019-04-08 NOTE — H&P (Signed)
Primary Care Physician:  [name], MD Primary Gastroenterologist:  Dr. Oneida Alar  Pre-Procedure History & Physical: HPI:  Linda [name] is a 73 y.o. female here for Anemia.  Past Medical History:  Diagnosis Date  . Allergy   . Anemia 07/25/2017  . Anxiety   . Arthritis   . Atrial fibrillation ([place])   . Cataract   . COPD (chronic obstructive pulmonary disease) ([place])   . Depression   . Diabetes mellitus without complication ([place])   . Emphysema of lung ([place])   . Hyperlipidemia   . Hypertension   . Oxygen deficiency   . Sleep apnea   . Typical atrial flutter [facility])     Past Surgical History:  Procedure Laterality Date  . A-FLUTTER ABLATION N/A 10/04/2017   Procedure: A-FLUTTER ABLATION;  Surgeon: Thompson Grayer, MD;  Location: [facility];  Service: Cardiovascular;  Laterality: N/A;  . ABDOMINAL HYSTERECTOMY     fibroids  . blood clot removal from base of brain  1990  . CARPAL TUNNEL RELEASE    . CATARACT EXTRACTION W/PHACO Left 06/16/2013   Procedure: CATARACT EXTRACTION PHACO AND INTRAOCULAR LENS PLACEMENT (IOC);  Surgeon: Tonny Branch, MD;  Location: [facility];  Service: Ophthalmology;  Laterality: Left;  CDE:  12.18  . CATARACT EXTRACTION W/PHACO Right 06/26/2013   Procedure: CATARACT EXTRACTION PHACO AND INTRAOCULAR LENS PLACEMENT (IOC);  Surgeon: Tonny Branch, MD;  Location: [facility];  Service: Ophthalmology;  Laterality: Right;  CDE:14.71  . CESAREAN SECTION    . CHOLECYSTECTOMY    . DILATION AND CURETTAGE OF UTERUS    . EYE SURGERY  12/2016  . FOOT NEUROMA SURGERY Right   . LOOP RECORDER INSERTION N/A 11/06/2017   Procedure: LOOP RECORDER INSERTION;  Surgeon: Thompson Grayer, MD;  Location: [facility];  Service: Cardiovascular;  Laterality: N/A;  . MOUTH SURGERY    . TONSILLECTOMY      Prior to Admission medications   Medication Sig Start Date End Date Taking? Authorizing Provider  acetaminophen (TYLENOL) 500 MG tablet Take 1,000 mg by mouth every 6 (six)  hours as needed for moderate pain or headache.    Yes [provider]  Alpha-Lipoic Acid 200 MG TABS Take 200 mg by mouth 2 (two) times daily.    Yes [provider]  apixaban (ELIQUIS) 5 MG TABS tablet Take 1 tablet (5 mg total) by mouth 2 (two) times daily. 02/19/18  Yes Herminio Commons, MD  cetirizine (ZYRTEC) 10 MG tablet Take 10 mg by mouth 2 (two) times daily.   Yes [provider]  Cholecalciferol (VITAMIN D) 50 MCG (2000 UT) CAPS Take 2,000 Units by mouth daily.    Yes [provider]  DULoxetine (CYMBALTA) 30 MG capsule Take 30 mg by mouth daily.   Yes [provider]  Fluticasone-Salmeterol (ADVAIR DISKUS) 250-50 MCG/DOSE AEPB Inhale 1 puff into the lungs 2 (two) times daily.   Yes [provider]  glucose blood (ONETOUCH VERIO) test strip Use as instructed 01/14/18  Yes [name], MD  Melatonin 10 MG TABS Take 10 mg by mouth at bedtime.    Yes [provider]  metFORMIN (GLUCOPHAGE) 500 MG tablet Take 500 mg by mouth daily.    Yes [provider]  metoprolol succinate (TOPROL-XL) 50 MG 24 hr tablet Take 50 mg by mouth 2 (two) times daily. Take with or immediately following a meal.   Yes [provider]  Multiple Vitamin (MULTIVITAMIN) tablet Take  1 tablet by mouth daily.   Yes [provider]  OXYGEN Inhale 2 L into the lungs at bedtime.    Yes [provider]  pravastatin (PRAVACHOL) 20 MG tablet Take 1 tablet (20 mg total) daily by mouth. Patient taking differently: Take 20 mg by mouth at bedtime.  08/08/17  Yes [name], MD  PROAIR HFA [phone] Base) MCG/ACT inhaler USE 1 TO 2 INHALATIONS EVERY 6 HOURS AS NEEDED FOR WHEEZING OR SHORTNESS OF BREATH Patient taking differently: Inhale 1-2 puffs into the lungs every 6 (six) hours as needed for wheezing or shortness of breath.  01/28/18  Yes [name], MD  Probiotic Product (PROBIOTIC DAILY PO) Take 1 capsule by  mouth daily.    Yes [provider]  tiotropium (SPIRIVA) 18 MCG inhalation capsule Place 18 mcg into inhaler and inhale daily.   Yes [provider]  vitamin B-12 (CYANOCOBALAMIN) 1000 MCG tablet Take 1,000 mcg by mouth daily.   Yes [provider]    Allergies as of 11/01/2018 - Review Complete 11/01/2018  Allergen Reaction Noted  . Other Swelling and Other (See Comments) 06/09/2013  . Trazodone Hives and Other (See Comments) 01/10/2017  . Trazodone and nefazodone Hives and Other (See Comments) 01/10/2017  . Nickel Other (See Comments) 10/04/2017  . Tetanus toxoids Other (See Comments) 06/28/2018  . Adhesive [tape] Itching 06/09/2013  . Bactrim [sulfamethoxazole-trimethoprim] Itching and Rash 06/09/2013  . Penicillins Itching, Rash, and Other (See Comments) 06/09/2013  . Ppd [tuberculin purified protein derivative] Swelling and Other (See Comments) 06/09/2013  . Sulfamethoxazole-trimethoprim Itching and Rash 06/09/2013    Family History  Problem Relation Age of Onset  . Emphysema Mother   . Alcohol abuse Mother   . Arthritis Mother   . COPD Mother   . Depression Mother   . Hyperlipidemia Mother   . Hypertension Mother   . Heart attack Father   . Cancer Father        lung  . Heart disease Father   . Diabetes Brother   . COPD Brother   . Other Maternal Grandmother        swine flu 1919  . Colon cancer Neg Hx   . Colon polyps Neg Hx     Social History   Socioeconomic History  . Marital status: Widowed    Spouse name: Not on file  . Number of children: 1  . Years of education: 67  . Highest education level: Not on file  Occupational History  . Occupation: retired    Comment: catering, [place]  . Financial resource strain: Not on file  . Food insecurity    Worry: Not on file    Inability: Not on file  . Transportation needs    Medical: Not on file    Non-medical: Not on file  Tobacco Use  . Smoking status: Former Smoker     Packs/day: 1.50    Types: Cigarettes    Start date: 09/25/1958    Quit date: 06/06/2001    Years since quitting: 17.8  . Smokeless tobacco: Never Used  Substance and Sexual Activity  . Alcohol use: No  . Drug use: No  . Sexual activity: Not Currently  Lifestyle  . Physical activity    Days per week: Not on file    Minutes per session: Not on file  . Stress: Not on file  Relationships  . Social connections    Talks on phone: Not on file  Gets together: Not on file    Attends religious service: Not on file    Active member of club or organization: Not on file    Attends meetings of clubs or organizations: Not on file    Relationship status: Not on file  . Intimate partner violence    Fear of current or ex partner: Not on file    Emotionally abused: Not on file    Physically abused: Not on file    Forced sexual activity: Not on file  Other Topics Concern  . Not on file  Social History Narrative   Lives alone   Son lives in [place]   TV, reads, puzzles    Review of Systems: See HPI, otherwise negative ROS   Physical Exam: BP 135/81   Pulse 72   Temp 98.2 F (36.8 C) (Oral)   Resp 19   Ht 5' 2.5" (1.588 m)   Wt 104.3 kg   SpO2 92%   BMI 41.40 kg/m  General:   Alert,  pleasant and cooperative in NAD Head:  Normocephalic and atraumatic. Neck:  Supple; Lungs:  Clear throughout to auscultation.    Heart:  Regular rate and rhythm. Abdomen:  Soft, nontender and nondistended. Normal bowel sounds, without guarding, and without rebound.   Neurologic:  Alert and  oriented x4;  grossly normal neurologically.  Impression/Plan:     Anemia  PLAN:  1. TCS/?EGD TODAY. DISCUSSED PROCEDURE, BENEFITS, & RISKS: < 1% chance of medication reaction, bleeding, perforation, ASPIRATION, or rupture of spleen/liver requiring surgery to fix it and missed polyps < 1 cm 10-20% of the time.

## 2019-04-08 NOTE — Op Note (Signed)
[facility] Patient Name: Linda [name] Procedure Date: 04/08/2019 2:49 PM MRN: [id number] Date of Birth: Aug 17, 1946 Attending MD: Barney Drain MD, MD CSN: [id number] Age: 73 Admit Type: Outpatient Procedure:                Colonoscopy with COLD SNARE POLYPECTOMY Indications:              Iron deficiency anemia Providers:                Barney Drain MD, MD, Janeece Riggers, RN, Bonnetta Barry,                            Technician Referring MD:             [name] Medicines:                Propofol per Anesthesia Complications:            No immediate complications. Estimated Blood Loss:     Estimated blood loss was minimal. Procedure:                Pre-Anesthesia Assessment:                           - Prior to the procedure, a History and Physical                            was performed, and patient medications and                            allergies were reviewed. The patient's tolerance of                            previous anesthesia was also reviewed. The risks                            and benefits of the procedure and the sedation                            options and risks were discussed with the patient.                            All questions were answered, and informed consent                            was obtained. Prior Anticoagulants: The patient has                            taken Eliquis (apixaban), last dose was 2 days                            prior to procedure. ASA Grade Assessment: III - A                            patient with severe systemic disease. After  reviewing the risks and benefits, the patient was                            deemed in satisfactory condition to undergo the                            procedure. After obtaining informed consent, the                            colonoscope was passed under direct vision.                            Throughout the procedure, the patient's blood      pressure, pulse, and oxygen saturations were                            monitored continuously. The [id number]-H190DL ([id number])                            scope was introduced through the anus and advanced                            to the 6 cm into the ileum. The colonoscopy was                            technically difficult and complex due to a                            redundant colon and the patient's body habitus.                            Successful completion of the procedure was aided by                            straightening and shortening the scope to obtain                            bowel loop reduction and COLOWRAP. The patient                            tolerated the procedure well. The quality of the                            bowel preparation was good. The terminal ileum,                            ileocecal valve, appendiceal orifice, and rectum                            were photographed. Scope In: 2:51:33 PM Scope Out: 3:41:28 PM Scope Withdrawal Time: 0 hours 47 minutes 15 seconds  Total Procedure Duration: 0 hours 49 minutes 55 seconds  Findings:      The terminal ileum appeared normal.  ELEVEN SESSILE polyps were found in the sigmoid colon(2: BTL 3),       descending colon(1: BTL 2), splenic flexure(1: BTL 2), transverse       colon(2: BTL 2), hepatic flexure(2: BTL 1) and ascending colon(3: BTL 1,       ONE NOT RETRIEVED > 1 CM). The polyp was removed with a cold snare.       Resection and retrieval were complete. To prevent bleeding after the       polypectomy, one hemostatic clip was successfully placed (MR       conditional) IN THE SIGMOID COLON. There was no bleeding at the end of       the procedure.      Multiple small and large-mouthed diverticula were found in the       recto-sigmoid colon and sigmoid colon.      External and internal hemorrhoids were found.      The recto-sigmoid colon, sigmoid colon and descending colon were       moderately  tortuous. Impression:               - The examined portion of the ileum was normal.                           - FEDA DUE TO COLON POLYPS WHILE ON ELIQUIS                           - MODERATE Diverticulosis in the recto-sigmoid                            colon and in the sigmoid colon.                           - External and internal hemorrhoids.                           - Tortuous RECTOSIGMOID colon. Moderate Sedation:      Per Anesthesia Care Recommendation:           - Patient has a contact number available for                            emergencies. The signs and symptoms of potential                            delayed complications were discussed with the                            patient. Return to normal activities tomorrow.                            Written discharge instructions were provided to the                            patient.                           - High fiber diet.                           -  Continue present medications.                           - Await pathology results. NEEDS KUB PRIOR TO MRI                            DUE TO CLIP IN LEFT COLON.                           - Repeat colonoscopy date to be determined after                            pending pathology results are reviewed for                            surveillance based on pathology results.                           - Return to GI office in 6 months. Procedure Code(s):        --- Professional ---                           [phone], Colonoscopy, flexible; with removal of                            tumor(s), polyp(s), or other lesion(s) by snare                            technique Diagnosis Code(s):        --- Professional ---                           K64.8, Other hemorrhoids                           K63.5, Polyp of colon                           D50.9, Iron deficiency anemia, unspecified                           K57.30, Diverticulosis of large intestine without                             perforation or abscess without bleeding                           Q43.8, Other specified congenital malformations of                            intestine CPT copyright 2019 American Medical Association. All rights reserved. The codes documented in this report are preliminary and upon coder review may  be revised to meet current compliance requirements. Barney Drain, MD Barney Drain MD, MD 04/08/2019 4:00:46 PM This report has been signed electronically. Number of Addenda: 0

## 2019-04-08 NOTE — Anesthesia Postprocedure Evaluation (Signed)
Anesthesia Post Note  Patient: Linda [name]  Procedure(s) Performed: COLONOSCOPY WITH PROPOFOL (N/A ) ESOPHAGOGASTRODUODENOSCOPY (EGD) WITH PROPOFOL (N/A ) POLYPECTOMY  Patient location during evaluation: PACU Anesthesia Type: General Level of consciousness: awake and alert and oriented Pain management: pain level controlled Vital Signs Assessment: post-procedure vital signs reviewed and stable Respiratory status: spontaneous breathing and respiratory function stable Cardiovascular status: stable Postop Assessment: no apparent nausea or vomiting Anesthetic complications: no     Last Vitals:  Vitals:   04/08/19 1151  BP: 135/81  Pulse: 72  Resp: 19  Temp: 36.8 C  SpO2: 92%    Last Pain:  Vitals:   04/08/19 1151  TempSrc: Oral  PainSc: 0-No pain                 ADAMS, AMY A

## 2019-04-08 NOTE — Transfer of Care (Signed)
Immediate Anesthesia Transfer of Care Note  Patient: Linda [name]  Procedure(s) Performed: COLONOSCOPY WITH PROPOFOL (N/A ) ESOPHAGOGASTRODUODENOSCOPY (EGD) WITH PROPOFOL (N/A ) POLYPECTOMY  Patient Location: PACU  Anesthesia Type:General  Level of Consciousness: awake, alert  and patient cooperative  Airway & Oxygen Therapy: Patient Spontanous Breathing  Post-op Assessment: Report given to RN and Post -op Vital signs reviewed and stable  Post vital signs: Reviewed and stable  Last Vitals:  Vitals Value Taken Time  BP    Temp    Pulse 81 04/08/19 1559  Resp 17 04/08/19 1559  SpO2 98 % 04/08/19 1559  Vitals shown include unvalidated device data.  Last Pain:  Vitals:   04/08/19 1151  TempSrc: Oral  PainSc: 0-No pain         Complications: No apparent anesthesia complications

## 2019-04-08 NOTE — Op Note (Signed)
[facility] Patient Name: Linda [name] Procedure Date: 04/08/2019 3:44 PM MRN: [id number] Date of Birth: Feb 20, 1946 Attending MD: Barney Drain MD, MD CSN: [id number] Age: 73 Admit Type: Outpatient Procedure:                Upper GI endoscopy WITH COLD FORCEPS BIOPSY Indications:              Iron deficiency anemia Providers:                Barney Drain MD, MD, Janeece Riggers, RN, Bonnetta Barry,                            Technician Referring MD:             [name] Medicines:                Propofol per Anesthesia Complications:            No immediate complications. Estimated Blood Loss:     Estimated blood loss was minimal. Procedure:                Pre-Anesthesia Assessment:                           - Prior to the procedure, a History and Physical                            was performed, and patient medications and                            allergies were reviewed. The patient's tolerance of                            previous anesthesia was also reviewed. The risks                            and benefits of the procedure and the sedation                            options and risks were discussed with the patient.                            All questions were answered, and informed consent                            was obtained. Prior Anticoagulants: The patient has                            taken Eliquis (apixaban), last dose was 2 days                            prior to procedure. ASA Grade Assessment: III - A                            patient with severe systemic disease. After  reviewing the risks and benefits, the patient was                            deemed in satisfactory condition to undergo the                            procedure. After obtaining informed consent, the                            endoscope was passed under direct vision.                            Throughout the procedure, the patient's blood                             pressure, pulse, and oxygen saturations were                            monitored continuously. The GIF-[id number] ([id number])                            scope was introduced through the mouth, and                            advanced to the second part of duodenum. The upper                            GI endoscopy was accomplished without difficulty.                            The patient tolerated the procedure well. Scope In: 3:47:07 PM Scope Out: 3:51:59 PM Total Procedure Duration: 0 hours 4 minutes 52 seconds  Findings:      The examined esophagus was normal.      Diffuse mild inflammation characterized by congestion (edema) and       erythema was found in the entire examined stomach. Biopsies(2; BODY,       1:INCISURA, 2: ANTRUM) were taken with a cold forceps for Helicobacter       pylori testing.      The examined duodenum was normal. Impression:               - FEDA DUE TO GASTRITIS/POLYPS . Moderate Sedation:      Per Anesthesia Care Recommendation:           - Patient has a contact number available for                            emergencies. The signs and symptoms of potential                            delayed complications were discussed with the                            patient. Return to normal activities tomorrow.  Written discharge instructions were provided to the                            patient.                           - High fiber diet and cardiac diet. LOSE WEIGHT TO                            BMI < 30.                           - Continue present medications. RESTART ELIQUIS JUL                            16. ADD OMEPRAZOLE ONCE DAILY.                           - Await pathology results.                           - Return to my office in 6 months. Procedure Code(s):        --- Professional ---                           [phone], Esophagogastroduodenoscopy, flexible,                            transoral; with biopsy, single or  multiple Diagnosis Code(s):        --- Professional ---                           K29.70, Gastritis, unspecified, without bleeding                           D50.9, Iron deficiency anemia, unspecified CPT copyright 2019 American Medical Association. All rights reserved. The codes documented in this report are preliminary and upon coder review may  be revised to meet current compliance requirements. Barney Drain, MD Barney Drain MD, MD 04/08/2019 4:06:07 PM This report has been signed electronically. Number of Addenda: 0

## 2019-04-08 NOTE — Discharge Instructions (Signed)
YOUR LOW BLOOD COUNT WAS DUE TO POLYPS AND GASTRITIS.  I REMOVED ELEVEN POLYPS. I PLACED A CLIP TO PREVENT BLEEDING IN 7-10 DAYS.   YOU HAVE DIVERTICULOSIS IN YOUR LEFT COLON.  THE LAST PART OF YOUR SMALL BOWEL IS NORMAL. I BIOPSIED YOUR STOMACH.    RE-START ELIQUIS JUL 16.  IF YOU NEED AN MRI, LET THE RADIOLOGY TECH KNOW THAT YOU HAD ONE CLIPS PLACED IN YOUR LEFT COLON. THEY SHOULD FALL OFF IN 30 DAYS.  DRINK WATER TO KEEP YOUR URINE LIGHT YELLOW.   FOLLOW A LOW FAT/HIGH FIBER DIET. AVOID ITEMS THAT CAUSE BLOATING. SEE INFO BELOW.  CONTINUE YOUR WEIGHT LOSS EFFORTS.  START OMEPRAZOLE.  TAKE 30 MINUTES PRIOR TO YOUR FIRST MEAL.  USE PREPARATION H FOUR TIMES  A DAY IF NEEDED TO RELIEVE RECTAL PAIN/PRESSURE/BLEEDING.  YOUR BIOPSY RESULTS WILL BE BACK IN 5 BUSINESS DAYS.  FOLLOW UP IN 6 MOS.   ENDOSCOPY Care After Read the instructions outlined below and refer to this sheet in the next week. These discharge instructions provide you with general information on caring for yourself after you leave the hospital. While your treatment has been planned according to the most current medical practices available, unavoidable complications occasionally occur. If you have any problems or questions after discharge, call DR. Cressie Betzler, [phone].  ACTIVITY  You may resume your regular activity, but move at a slower pace for the next 24 hours.   Take frequent rest periods for the next 24 hours.   Walking will help get rid of the air and reduce the bloated feeling in your belly (abdomen).   No driving for 24 hours (because of the medicine (anesthesia) used during the test).   You may shower.   Do not sign any important legal documents or operate any machinery for 24 hours (because of the anesthesia used during the test).    NUTRITION  Drink plenty of fluids.   You may resume your normal diet as instructed by your doctor.   Begin with a light meal and progress to your normal diet. Heavy  or fried foods are harder to digest and may make you feel sick to your stomach (nauseated).   Avoid alcoholic beverages for 24 hours or as instructed.    MEDICATIONS  You may resume your normal medications.   WHAT YOU CAN EXPECT TODAY  Some feelings of bloating in the abdomen.   Passage of more gas than usual.   Spotting of blood in your stool or on the toilet paper  .  IF YOU HAD POLYPS REMOVED DURING THE ENDOSCOPY:  Eat a soft diet IF YOU HAVE NAUSEA, BLOATING, ABDOMINAL PAIN, OR VOMITING.    FINDING OUT THE RESULTS OF YOUR TEST Not all test results are available during your visit. DR. Oneida Alar WILL CALL YOU WITHIN 14 DAYS OF YOUR PROCEDUE WITH YOUR RESULTS. Do not assume everything is normal if you have not heard from DR. Coretta Leisey, CALL HER OFFICE AT [phone].  SEEK IMMEDIATE MEDICAL ATTENTION AND CALL THE OFFICE: [phone] IF:  You have more than a spotting of blood in your stool.   Your belly is swollen (abdominal distention).   You are nauseated or vomiting.   You have a temperature over 101F.   You have abdominal pain or discomfort that is severe or gets worse throughout the day.   Gastritis  Gastritis is an inflammation (the body's way of reacting to injury and/or infection) of the stomach. It is often caused by bacterial (germ) infections.  It can also be caused BY ASPIRIN, BC/GOODY POWDER'S, (IBUPROFEN) MOTRIN, OR ALEVE (NAPROXEN), chemicals (including alcohol), SPICY FOODS, and medications. This illness may be associated with generalized malaise (feeling tired, not well), UPPER ABDOMINAL STOMACH cramps, and fever. One common bacterial cause of gastritis is an organism known as H. Pylori. This can be treated with antibiotics.    High-Fiber Diet A high-fiber diet changes your normal diet to include more whole grains, legumes, fruits, and vegetables. Changes in the diet involve replacing refined carbohydrates with unrefined foods. The calorie level of the diet  is essentially unchanged. The Dietary Reference Intake (recommended amount) for adult males is 38 grams per day. For adult females, it is 25 grams per day. Pregnant and lactating women should consume 28 grams of fiber per day. Fiber is the intact part of a plant that is not broken down during digestion. Functional fiber is fiber that has been isolated from the plant to provide a beneficial effect in the body. PURPOSE  Increase stool bulk.   Ease and regulate bowel movements.   Lower cholesterol.  REDUCE RISK OF COLON CANCER  INDICATIONS THAT YOU NEED MORE FIBER  Constipation and hemorrhoids.   Uncomplicated diverticulosis (intestine condition) and irritable bowel syndrome.   Weight management.   As a protective measure against hardening of the arteries (atherosclerosis), diabetes, and cancer.   GUIDELINES FOR INCREASING FIBER IN THE DIET  Start adding fiber to the diet slowly. A gradual increase of about 5 more grams (2 slices of whole-wheat bread, 2 servings of most fruits or vegetables, or 1 bowl of high-fiber cereal) per day is best. Too rapid an increase in fiber may result in constipation, flatulence, and bloating.   Drink enough water and fluids to keep your urine clear or pale yellow. Water, juice, or caffeine-free drinks are recommended. Not drinking enough fluid may cause constipation.   Eat a variety of high-fiber foods rather than one type of fiber.   Try to increase your intake of fiber through using high-fiber foods rather than fiber pills or supplements that contain small amounts of fiber.   The goal is to change the types of food eaten. Do not supplement your present diet with high-fiber foods, but replace foods in your present diet.    INCLUDE A VARIETY OF FIBER SOURCES  Replace refined and processed grains with whole grains, canned fruits with fresh fruits, and incorporate other fiber sources. White rice, white breads, and most bakery goods contain little or no  fiber.   Brown whole-grain rice, buckwheat oats, and many fruits and vegetables are all good sources of fiber. These include: broccoli, Brussels sprouts, cabbage, cauliflower, beets, sweet potatoes, white potatoes (skin on), carrots, tomatoes, eggplant, squash, berries, fresh fruits, and dried fruits.   Cereals appear to be the richest source of fiber. Cereal fiber is found in whole grains and bran. Bran is the fiber-rich outer coat of cereal grain, which is largely removed in refining. In whole-grain cereals, the bran remains. In breakfast cereals, the largest amount of fiber is found in those with "bran" in their names. The fiber content is sometimes indicated on the label.   You may need to include additional fruits and vegetables each day.   In baking, for 1 cup white flour, you may use the following substitutions:   1 cup whole-wheat flour minus 2 tablespoons.   1/2 cup white flour plus 1/2 cup whole-wheat flour.   Low-Fat Diet BREADS, CEREALS, PASTA, RICE, DRIED PEAS, AND BEANS These products  are high in carbohydrates and most are low in fat. Therefore, they can be increased in the diet as substitutes for fatty foods. They too, however, contain calories and should not be eaten in excess. Cereals can be eaten for snacks as well as for breakfast.  Include foods that contain fiber (fruits, vegetables, whole grains, and legumes). Research shows that fiber may lower blood cholesterol levels, especially the water-soluble fiber found in fruits, vegetables, oat products, and legumes. FRUITS AND VEGETABLES It is good to eat fruits and vegetables. Besides being sources of fiber, both are rich in vitamins and some minerals. They help you get the daily allowances of these nutrients. Fruits and vegetables can be used for snacks and desserts. MEATS Limit lean meat, chicken, [place], and fish to no more than 6 ounces per day. Beef, Pork, and Lamb Use lean cuts of beef, pork, and lamb. Lean cuts include:   Extra-lean ground beef.  Arm roast.  Sirloin tip.  Center-cut ham.  Round steak.  Loin chops.  Rump roast.  Tenderloin.  Trim all fat off the outside of meats before cooking. It is not necessary to severely decrease the intake of red meat, but lean choices should be made. Lean meat is rich in protein and contains a highly absorbable form of iron. Premenopausal women, in particular, should avoid reducing lean red meat because this could increase the risk for low red blood cells (iron-deficiency anemia). The organ meats, such as liver, sweetbreads, kidneys, and brain are very rich in cholesterol. They should be limited. Chicken and [place] These are good sources of protein. The fat of poultry can be reduced by removing the skin and underlying fat layers before cooking. Chicken and [place] can be substituted for lean red meat in the diet. Poultry should not be fried or covered with high-fat sauces. Fish and Shellfish Fish is a good source of protein. Shellfish contain cholesterol, but they usually are low in saturated fatty acids. The preparation of fish is important. Like chicken and [place], they should not be fried or covered with high-fat sauces. EGGS Egg whites contain no fat or cholesterol. They can be eaten often. Try 1 to 2 egg whites instead of whole eggs in recipes or use egg substitutes that do not contain yolk. MILK AND DAIRY PRODUCTS Use skim or 1% milk instead of 2% or whole milk. Decrease whole milk, natural, and processed cheeses. Use nonfat or low-fat (2%) cottage cheese or low-fat cheeses made from vegetable oils. Choose nonfat or low-fat (1 to 2%) yogurt. Experiment with evaporated skim milk in recipes that call for heavy cream. Substitute low-fat yogurt or low-fat cottage cheese for sour cream in dips and salad dressings. Have at least 2 servings of low-fat dairy products, such as 2 glasses of skim (or 1%) milk each day to help get your daily calcium intake.  FATS AND OILS Reduce  the total intake of fats, especially saturated fat. Butterfat, lard, and beef fats are high in saturated fat and cholesterol. These should be avoided as much as possible. Vegetable fats do not contain cholesterol, but certain vegetable fats, such as coconut oil, palm oil, and palm kernel oil are very high in saturated fats. These should be limited. These fats are often used in bakery goods, processed foods, popcorn, oils, and nondairy creamers. Vegetable shortenings and some peanut butters contain hydrogenated oils, which are also saturated fats. Read the labels on these foods and check for saturated vegetable oils. Unsaturated vegetable oils and fats do not raise  blood cholesterol. However, they should be limited because they are fats and are high in calories. Total fat should still be limited to 30% of your daily caloric intake. Desirable liquid vegetable oils are corn oil, cottonseed oil, olive oil, canola oil, safflower oil, soybean oil, and sunflower oil. Peanut oil is not as good, but small amounts are acceptable. Buy a heart-healthy tub margarine that has no partially hydrogenated oils in the ingredients. Mayonnaise and salad dressings often are made from unsaturated fats, but they should also be limited because of their high calorie and fat content. Seeds, nuts, peanut butter, olives, and avocados are high in fat, but the fat is mainly the unsaturated type. These foods should be limited mainly to avoid excess calories and fat. OTHER EATING TIPS Snacks  Most sweets should be limited as snacks. They tend to be rich in calories and fats, and their caloric content outweighs their nutritional value. Some good choices in snacks are graham crackers, melba toast, soda crackers, bagels (no egg), English muffins, fruits, and vegetables. These snacks are preferable to snack crackers, [place] fries, and chips. Popcorn should be air-popped or cooked in small amounts of liquid vegetable oil. Desserts Eat fruit,  low-fat yogurt, and fruit ices. AVOID pastries, cake, and cookies. Sherbet, angel food cake, gelatin dessert, frozen low-fat yogurt, or other frozen products that do not contain saturated fat (pure fruit juice bars, frozen ice pops) are also acceptable.  COOKING METHODS Choose those methods that use little or no fat. They include: Poaching.  Braising.  Steaming.  Grilling.  Baking.  Stir-frying.  Broiling.  Microwaving.  Foods can be cooked in a nonstick pan without added fat, or use a nonfat cooking spray in regular cookware. Limit fried foods and avoid frying in saturated fat. Add moisture to lean meats by using water, broth, cooking wines, and other nonfat or low-fat sauces along with the cooking methods mentioned above. Soups and stews should be chilled after cooking. The fat that forms on top after a few hours in the refrigerator should be skimmed off. When preparing meals, avoid using excess salt. Salt can contribute to raising blood pressure in some people. EATING AWAY FROM HOME Order entres, potatoes, and vegetables without sauces or butter. When meat exceeds the size of a deck of cards (3 to 4 ounces), the rest can be taken home for another meal. Choose vegetable or fruit salads and ask for low-calorie salad dressings to be served on the side. Use dressings sparingly. Limit high-fat toppings, such as bacon, crumbled eggs, cheese, sunflower seeds, and olives. Ask for heart-healthy tub margarine instead of butter.  Diverticulosis Diverticulosis is a common condition that develops when small pouches (diverticula) form in the wall of the colon OR SMALL BOWEL. The risk of diverticulosis increases with age. It happens more often in people who eat a low-fiber diet. Most individuals with diverticulosis have no symptoms. Those individuals with symptoms usually experience belly (abdominal) pain, constipation, or loose stools (diarrhea).  HOME CARE INSTRUCTIONS Increase the amount of fiber in  your diet as directed by your caregiver or dietician. This may reduce symptoms of diverticulosis.  Drink at least 6 to 8 glasses of water each day to prevent constipation.  Try not to strain when you have a bowel movement.  Avoiding nuts and seeds to prevent complications is NOT NECESSARY.   FOODS HAVING HIGH FIBER CONTENT INCLUDE: Fruits. Apple, peach, pear, tangerine, raisins, prunes.  Vegetables. Brussels sprouts, asparagus, broccoli, cabbage, carrot, cauliflower, romaine lettuce, spinach, summer  squash, tomato, winter squash, zucchini.  Starchy Vegetables. Baked beans, kidney beans, lima beans, split peas, lentils, potatoes (with skin).  Grains. Whole wheat bread, brown rice, bran flake cereal, plain oatmeal, white rice, shredded wheat, bran muffins.   SEEK IMMEDIATE MEDICAL CARE IF: You develop increasing pain or severe bloating.  You have an oral temperature above 101F.  You develop vomiting or bowel movements that are bloody or black.

## 2019-04-08 NOTE — Anesthesia Preprocedure Evaluation (Addendum)
Anesthesia Evaluation  Patient identified by MRN, date of birth, ID band Patient awake    Reviewed: Allergy & Precautions, NPO status , Patient's Chart, lab work & pertinent test results, reviewed documented beta blocker date and time , Unable to perform ROS - Chart review only  Airway Mallampati: III  TM Distance: >3 FB Neck ROM: full    Dental no notable dental hx.    Pulmonary sleep apnea , COPD,  COPD inhaler, former smoker,    Pulmonary exam normal        Cardiovascular hypertension, + CAD  Normal cardiovascular exam     Neuro/Psych PSYCHIATRIC DISORDERS Anxiety Depression    GI/Hepatic   Endo/Other  diabetes, Poorly Controlled  Renal/GU      Musculoskeletal  (+) Arthritis ,   Abdominal   Peds  Hematology  (+) Blood dyscrasia, anemia ,   Anesthesia Other Findings   Reproductive/Obstetrics                             Anesthesia Physical Anesthesia Plan  ASA: III  Anesthesia Plan: General   Post-op Pain Management:    Induction:   PONV Risk Score and Plan: 1 and TIVA  Airway Management Planned:   Additional Equipment:   Intra-op Plan:   Post-operative Plan:   Informed Consent:   Plan Discussed with: CRNA  Anesthesia Plan Comments:        Anesthesia Quick Evaluation

## 2019-04-09 NOTE — Addendum Note (Signed)
Addendum  created 04/09/19 0725 by Ollen Bowl, CRNA   Charge Capture section accepted

## 2019-04-10 ENCOUNTER — Telehealth: Payer: Self-pay | Admitting: Gastroenterology

## 2019-04-11 ENCOUNTER — Encounter (HOSPITAL_COMMUNITY): Payer: Self-pay | Admitting: Gastroenterology

## 2019-04-14 ENCOUNTER — Other Ambulatory Visit: Payer: Self-pay

## 2019-04-14 ENCOUNTER — Ambulatory Visit (HOSPITAL_COMMUNITY)
Admission: RE | Admit: 2019-04-14 | Discharge: 2019-04-14 | Disposition: A | Discharge status: Home / Self Care | Payer: Medicare Other | Source: Ambulatory Visit | Attending: Internal Medicine | Admitting: Internal Medicine

## 2019-04-14 DIAGNOSIS — Z1231 Encounter for screening mammogram for malignant neoplasm of breast: Secondary | ICD-10-CM | POA: Diagnosis not present

## 2019-04-14 MED ORDER — PYLERA 140-125-125 MG PO CAPS: ORAL_CAPSULE | ORAL | 0 refills | Status: DC

## 2019-04-14 NOTE — Telephone Encounter (Signed)
PT was made aware and will call if problems getting the medication.

## 2019-04-14 NOTE — Telephone Encounter (Signed)
SCHEDULED AND ON RECALL  °

## 2019-04-14 NOTE — Telephone Encounter (Signed)
Please call pt. 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 She has an allergy to PCN and so she needs PYLERA 3 PILLS QID FOR 10 DAYS,. She should take OMEPRAZOLE BID WHILE TAKING ANTIBIOTICS.Marland Kitchen The meds can cause nausea, vomiting, abd cramps, loose stools, black colored stools, and metallic taste in her mouth.  RE-START ELIQUIS JUL 16.  IF YOU NEED AN MRI, LET THE RADIOLOGY TECH KNOW THAT YOU HAD ONE CLIPS PLACED IN YOUR LEFT COLON. THEY SHOULD FALL OFF IN 30 DAYS.  FOLLOW UP IN 6 MOS.  NEXT COLONOSCOPY IN 3 YEARS.

## 2019-04-16 DIAGNOSIS — R918 Other nonspecific abnormal finding of lung field: Secondary | ICD-10-CM | POA: Diagnosis not present

## 2019-04-18 ENCOUNTER — Ambulatory Visit (INDEPENDENT_AMBULATORY_CARE_PROVIDER_SITE_OTHER): Payer: Medicare Other | Admitting: *Deleted

## 2019-04-18 DIAGNOSIS — I4891 Unspecified atrial fibrillation: Secondary | ICD-10-CM

## 2019-04-19 LAB — CUP PACEART REMOTE DEVICE CHECK
Date Time Interrogation Session: 20200724201001
Implantable Pulse Generator Implant Date: 20190212

## 2019-04-24 NOTE — Progress Notes (Signed)
Carelink Summary Report / Loop Recorder 

## 2019-05-12 DIAGNOSIS — H35372 Puckering of macula, left eye: Secondary | ICD-10-CM | POA: Diagnosis not present

## 2019-05-21 ENCOUNTER — Ambulatory Visit (INDEPENDENT_AMBULATORY_CARE_PROVIDER_SITE_OTHER): Payer: Medicare Other | Admitting: *Deleted

## 2019-05-21 DIAGNOSIS — I4891 Unspecified atrial fibrillation: Secondary | ICD-10-CM

## 2019-05-22 LAB — CUP PACEART REMOTE DEVICE CHECK
Date Time Interrogation Session: 20200826204032
Implantable Pulse Generator Implant Date: 20190212

## 2019-05-29 NOTE — Progress Notes (Signed)
Carelink Summary Report / Loop Recorder 

## 2019-06-03 ENCOUNTER — Other Ambulatory Visit: Payer: Self-pay

## 2019-06-03 ENCOUNTER — Inpatient Hospital Stay (HOSPITAL_COMMUNITY): Payer: Medicare Other | Attending: Hematology

## 2019-06-03 DIAGNOSIS — Z87891 Personal history of nicotine dependence: Secondary | ICD-10-CM | POA: Diagnosis not present

## 2019-06-03 DIAGNOSIS — D509 Iron deficiency anemia, unspecified: Secondary | ICD-10-CM | POA: Diagnosis not present

## 2019-06-03 DIAGNOSIS — I10 Essential (primary) hypertension: Secondary | ICD-10-CM | POA: Diagnosis not present

## 2019-06-03 DIAGNOSIS — Z7901 Long term (current) use of anticoagulants: Secondary | ICD-10-CM | POA: Insufficient documentation

## 2019-06-03 DIAGNOSIS — Z7951 Long term (current) use of inhaled steroids: Secondary | ICD-10-CM | POA: Diagnosis not present

## 2019-06-03 DIAGNOSIS — Z7984 Long term (current) use of oral hypoglycemic drugs: Secondary | ICD-10-CM | POA: Insufficient documentation

## 2019-06-03 DIAGNOSIS — D500 Iron deficiency anemia secondary to blood loss (chronic): Secondary | ICD-10-CM

## 2019-06-03 DIAGNOSIS — D696 Thrombocytopenia, unspecified: Secondary | ICD-10-CM | POA: Diagnosis not present

## 2019-06-03 DIAGNOSIS — Z79899 Other long term (current) drug therapy: Secondary | ICD-10-CM | POA: Diagnosis not present

## 2019-06-03 DIAGNOSIS — I4891 Unspecified atrial fibrillation: Secondary | ICD-10-CM | POA: Diagnosis not present

## 2019-06-03 DIAGNOSIS — E785 Hyperlipidemia, unspecified: Secondary | ICD-10-CM | POA: Diagnosis not present

## 2019-06-03 DIAGNOSIS — J449 Chronic obstructive pulmonary disease, unspecified: Secondary | ICD-10-CM | POA: Diagnosis not present

## 2019-06-03 DIAGNOSIS — R5383 Other fatigue: Secondary | ICD-10-CM | POA: Insufficient documentation

## 2019-06-03 DIAGNOSIS — E119 Type 2 diabetes mellitus without complications: Secondary | ICD-10-CM | POA: Diagnosis not present

## 2019-06-03 LAB — CBC WITH DIFFERENTIAL/PLATELET
Abs Immature Granulocytes: 0.01 10*3/uL (ref 0.00–0.07)
Basophils Absolute: 0 10*3/uL (ref 0.0–0.1)
Basophils Relative: 0 %
Eosinophils Absolute: 0.1 10*3/uL (ref 0.0–0.5)
Eosinophils Relative: 3 %
HCT: 40 % (ref 36.0–46.0)
Hemoglobin: 13 g/dL (ref 12.0–15.0)
Immature Granulocytes: 0 %
Lymphocytes Relative: 22 %
Lymphs Abs: 1 10*3/uL (ref 0.7–4.0)
MCH: 34.9 pg — ABNORMAL HIGH (ref 26.0–34.0)
MCHC: 32.5 g/dL (ref 30.0–36.0)
MCV: 107.2 fL — ABNORMAL HIGH (ref 80.0–100.0)
Monocytes Absolute: 0.3 10*3/uL (ref 0.1–1.0)
Monocytes Relative: 6 %
Neutro Abs: 3.1 10*3/uL (ref 1.7–7.7)
Neutrophils Relative %: 69 %
Platelets: 98 10*3/uL — ABNORMAL LOW (ref 150–400)
RBC: 3.73 MIL/uL — ABNORMAL LOW (ref 3.87–5.11)
RDW: 14.2 % (ref 11.5–15.5)
WBC: 4.5 10*3/uL (ref 4.0–10.5)
nRBC: 0 % (ref 0.0–0.2)

## 2019-06-03 LAB — IRON AND TIBC
Iron: 67 ug/dL (ref 28–170)
Saturation Ratios: 25 % (ref 10.4–31.8)
TIBC: 271 ug/dL (ref 250–450)
UIBC: 204 ug/dL

## 2019-06-03 LAB — COMPREHENSIVE METABOLIC PANEL
ALT: 23 U/L (ref 0–44)
AST: 32 U/L (ref 15–41)
Albumin: 3.4 g/dL — ABNORMAL LOW (ref 3.5–5.0)
Alkaline Phosphatase: 94 U/L (ref 38–126)
Anion gap: 6 (ref 5–15)
BUN: 14 mg/dL (ref 8–23)
CO2: 31 mmol/L (ref 22–32)
Calcium: 9.6 mg/dL (ref 8.9–10.3)
Chloride: 106 mmol/L (ref 98–111)
Creatinine, Ser: 0.78 mg/dL (ref 0.44–1.00)
GFR calc Af Amer: >60 mL/min (ref >60)
GFR calc non Af Amer: >60 mL/min (ref >60)
Glucose, Bld: 127 mg/dL — ABNORMAL HIGH (ref 70–99)
Potassium: 3.8 mmol/L (ref 3.5–5.1)
Sodium: 143 mmol/L (ref 135–145)
Total Bilirubin: 1.3 mg/dL — ABNORMAL HIGH (ref 0.3–1.2)
Total Protein: 6.3 g/dL — ABNORMAL LOW (ref 6.5–8.1)

## 2019-06-03 LAB — FERRITIN: Ferritin: 82 ng/mL (ref 11–307)

## 2019-06-09 DIAGNOSIS — I10 Essential (primary) hypertension: Secondary | ICD-10-CM | POA: Diagnosis not present

## 2019-06-09 DIAGNOSIS — E119 Type 2 diabetes mellitus without complications: Secondary | ICD-10-CM | POA: Diagnosis not present

## 2019-06-09 DIAGNOSIS — Z87891 Personal history of nicotine dependence: Secondary | ICD-10-CM | POA: Diagnosis not present

## 2019-06-09 DIAGNOSIS — E1140 Type 2 diabetes mellitus with diabetic neuropathy, unspecified: Secondary | ICD-10-CM | POA: Diagnosis not present

## 2019-06-09 DIAGNOSIS — F329 Major depressive disorder, single episode, unspecified: Secondary | ICD-10-CM | POA: Diagnosis not present

## 2019-06-09 DIAGNOSIS — Z299 Encounter for prophylactic measures, unspecified: Secondary | ICD-10-CM | POA: Diagnosis not present

## 2019-06-09 DIAGNOSIS — Z6841 Body Mass Index (BMI) 40.0 and over, adult: Secondary | ICD-10-CM | POA: Diagnosis not present

## 2019-06-10 ENCOUNTER — Encounter (HOSPITAL_COMMUNITY): Payer: Self-pay | Admitting: Hematology

## 2019-06-10 ENCOUNTER — Other Ambulatory Visit: Payer: Self-pay

## 2019-06-10 ENCOUNTER — Inpatient Hospital Stay (HOSPITAL_BASED_OUTPATIENT_CLINIC_OR_DEPARTMENT_OTHER): Payer: Medicare Other | Admitting: Hematology

## 2019-06-10 VITALS — BP 141/63 | HR 73 | Temp 96.9°F | Resp 18 | Wt 232.2 lb

## 2019-06-10 DIAGNOSIS — D696 Thrombocytopenia, unspecified: Secondary | ICD-10-CM | POA: Diagnosis not present

## 2019-06-10 DIAGNOSIS — R5383 Other fatigue: Secondary | ICD-10-CM | POA: Diagnosis not present

## 2019-06-10 DIAGNOSIS — E785 Hyperlipidemia, unspecified: Secondary | ICD-10-CM | POA: Diagnosis not present

## 2019-06-10 DIAGNOSIS — D509 Iron deficiency anemia, unspecified: Secondary | ICD-10-CM | POA: Diagnosis not present

## 2019-06-10 DIAGNOSIS — D500 Iron deficiency anemia secondary to blood loss (chronic): Secondary | ICD-10-CM

## 2019-06-10 DIAGNOSIS — J449 Chronic obstructive pulmonary disease, unspecified: Secondary | ICD-10-CM | POA: Diagnosis not present

## 2019-06-10 DIAGNOSIS — I10 Essential (primary) hypertension: Secondary | ICD-10-CM | POA: Diagnosis not present

## 2019-06-10 NOTE — Patient Instructions (Signed)
[facility] Discharge Instructions  You were seen today by Dr. Katragadda. He went over your recent lab results. He will see you back in 3 months for labs and follow up.   Thank you for choosing [facility] at [facility] to provide your oncology and hematology care.  To afford each patient quality time with our provider, please arrive at least 15 minutes before your scheduled appointment time.   If you have a lab appointment with the Cancer Center please come in thru the  Main Entrance and check in at the main information desk  You need to re-schedule your appointment should you arrive 10 or more minutes late.  We strive to give you quality time with our providers, and arriving late affects you and other patients whose appointments are after yours.  Also, if you no show three or more times for appointments you may be dismissed from the clinic at the providers discretion.     Again, thank you for choosing [facility].  Our hope is that these requests will decrease the amount of time that you wait before being seen by our physicians.       _____________________________________________________________  Should you have questions after your visit to [facility], please contact our office at [phone] between the hours of 8:00 a.m. and 4:30 p.m.  Voicemails left after 4:00 p.m. will not be returned until the following business day.  For prescription refill requests, have your pharmacy contact our office and allow 72 hours.    Cancer Center Support Programs:   > Cancer Support Group  2nd Tuesday of the month 1pm-2pm, Journey Room    

## 2019-06-10 NOTE — Assessment & Plan Note (Signed)
1.  Iron deficiency anemia: -Colonoscopy on 04/08/2019 shows normal ileum, moderate diverticulosis in the rectosigmoid colon, external and internal hemorrhoids.  Pathology consistent with tubular adenomas. - Feraheme on 03/07/2019 and 03/17/2019. - She denies any bleeding per rectum or melena.  However she complains of feeling very tired. -We reviewed her blood work.  Hemoglobin is 13.  Ferritin is 82. - Because of her severe fatigue, I have recommended 1 infusion of Feraheme. -We will see her back in 3 months for follow-up.  2.  Thrombocytopenia: - Last 2 CBC shows mild to moderate thrombocytopenia.  Latest platelet count is 98.  She denies any easy bruising or bleeding. - I have reviewed her platelets over the last several years.  She had one episode of low platelet count in April 2019. - I plan to repeat platelet count at next visit.  We will also check K38, folic acid, copper levels. -Differential diagnosis includes immune mediated thrombocytopenia versus MDS.  3.  Atrial fibrillation: -She will continue Eliquis twice daily.

## 2019-06-10 NOTE — Progress Notes (Signed)
[place], [place] [address]   CLINIC:  Medical Oncology/Hematology  PCP:  [name], MD [place] [address] [phone]   REASON FOR VISIT:  Follow-up for iron deficiency anemia.     INTERVAL HISTORY:  Linda [name] 73 y.o. female seen for follow-up of iron deficiency anemia.  Denies any bleeding per rectum or melena.  Had a colonoscopy in July of this year.  Appetite is 100%.  Energy levels are however low at 25%.  No pain is reported.  No chest pains or lightheadedness reported.  No dyspnea on minimal exertion.  Denies any nausea, vomiting, diarrhea or constipation.  Last Shirlean Kelly was in June 2020.  She is reportedly taking B12 tablet daily.  She also takes Eliquis and denies any bleeding per rectum or melena.    REVIEW OF SYSTEMS:  Review of Systems  Constitutional: Positive for fatigue.  All other systems reviewed and are negative.    PAST MEDICAL/SURGICAL HISTORY:  Past Medical History:  Diagnosis Date  . Allergy   . Anemia 07/25/2017  . Anxiety   . Arthritis   . Atrial fibrillation ([place])   . Cataract   . COPD (chronic obstructive pulmonary disease) ([place])   . Depression   . Diabetes mellitus without complication ([place])   . Emphysema of lung ([place])   . Hyperlipidemia   . Hypertension   . Oxygen deficiency   . Sleep apnea   . Typical atrial flutter [facility])    Past Surgical History:  Procedure Laterality Date  . A-FLUTTER ABLATION N/A 10/04/2017   Procedure: A-FLUTTER ABLATION;  Surgeon: Thompson Grayer, MD;  Location: [facility];  Service: Cardiovascular;  Laterality: N/A;  . ABDOMINAL HYSTERECTOMY     fibroids  . blood clot removal from base of brain  1990  . CARPAL TUNNEL RELEASE    . CATARACT EXTRACTION W/PHACO Left 06/16/2013   Procedure: CATARACT EXTRACTION PHACO AND INTRAOCULAR LENS PLACEMENT (IOC);  Surgeon: Tonny Branch, MD;  Location: [facility];  Service: Ophthalmology;  Laterality: Left;  CDE:  12.18   . CATARACT EXTRACTION W/PHACO Right 06/26/2013   Procedure: CATARACT EXTRACTION PHACO AND INTRAOCULAR LENS PLACEMENT (IOC);  Surgeon: Tonny Branch, MD;  Location: [facility];  Service: Ophthalmology;  Laterality: Right;  CDE:14.71  . CESAREAN SECTION    . CHOLECYSTECTOMY    . COLONOSCOPY WITH PROPOFOL N/A 04/08/2019   Procedure: COLONOSCOPY WITH PROPOFOL;  Surgeon: Danie Binder, MD;  Location: [facility];  Service: Endoscopy;  Laterality: N/A;  2:15pm  . DILATION AND CURETTAGE OF UTERUS    . ESOPHAGOGASTRODUODENOSCOPY (EGD) WITH PROPOFOL N/A 04/08/2019   Procedure: ESOPHAGOGASTRODUODENOSCOPY (EGD) WITH PROPOFOL;  Surgeon: Danie Binder, MD;  [facility];  Service: Endoscopy;  Laterality: N/A;  . EYE SURGERY  12/2016  . FOOT NEUROMA SURGERY Right   . LOOP RECORDER INSERTION N/A 11/06/2017   Procedure: LOOP RECORDER INSERTION;  Surgeon: Thompson Grayer, MD;  Location: [facility];  Service: Cardiovascular;  Laterality: N/A;  . MOUTH SURGERY    . POLYPECTOMY  04/08/2019   Procedure: POLYPECTOMY;  Surgeon: Danie Binder, MD;  [facility];  Service: Endoscopy;;  . TONSILLECTOMY       SOCIAL HISTORY:  Social History   Socioeconomic History  . Marital status: Widowed    Spouse name: Not on file  . Number of children: 1  . Years of education: 16  . Highest  education level: Not on file  Occupational History  . Occupation: retired    Comment: catering, [employer]  . Financial resource strain: Not on file  . Food insecurity    Worry: Not on file    Inability: Not on file  . Transportation needs    Medical: Not on file    Non-medical: Not on file  Tobacco Use  . Smoking status: Former Smoker    Packs/day: 1.50    Types: Cigarettes    Start date: 09/25/1958    Quit date: 06/06/2001    Years since quitting: 18.0  . Smokeless tobacco: Never Used  Substance and Sexual Activity  . Alcohol use: No  . Drug use: No  . Sexual activity: Not Currently   Lifestyle  . Physical activity    Days per week: Not on file    Minutes per session: Not on file  . Stress: Not on file  Relationships  . Social Herbalist on phone: Not on file    Gets together: Not on file    Attends religious service: Not on file    Active member of club or organization: Not on file    Attends meetings of clubs or organizations: Not on file    Relationship status: Not on file  . Intimate partner violence    Fear of current or ex partner: Not on file    Emotionally abused: Not on file    Physically abused: Not on file    Forced sexual activity: Not on file  Other Topics Concern  . Not on file  Social History Narrative   Lives alone   Son lives in [place]   TV, reads, puzzles    FAMILY HISTORY:  Family History  Problem Relation Age of Onset  . Emphysema Mother   . Alcohol abuse Mother   . Arthritis Mother   . COPD Mother   . Depression Mother   . Hyperlipidemia Mother   . Hypertension Mother   . Heart attack Father   . Cancer Father        lung  . Heart disease Father   . Diabetes Brother   . COPD Brother   . Other Maternal Grandmother        swine flu 1919  . Colon cancer Neg Hx   . Colon polyps Neg Hx     CURRENT MEDICATIONS:  Outpatient Encounter Medications as of 06/10/2019  Medication Sig Note  . Alpha-Lipoic Acid 200 MG TABS Take 200 mg by mouth 2 (two) times daily.    Marland Kitchen apixaban (ELIQUIS) 5 MG TABS tablet Take 1 tablet (5 mg total) by mouth 2 (two) times daily. 04/01/2019: Last dose to be on 04/05/2019. On hold for procedure.  . cetirizine (ZYRTEC) 10 MG tablet Take 10 mg by mouth 2 (two) times daily.   . Cholecalciferol (VITAMIN D) 50 MCG (2000 UT) CAPS Take 2,000 Units by mouth daily.    . DULoxetine (CYMBALTA) 30 MG capsule Take 30 mg by mouth daily.   . Fluticasone-Salmeterol (ADVAIR DISKUS) 250-50 MCG/DOSE AEPB Inhale 1 puff into the lungs 2 (two) times daily.   Marland Kitchen glipiZIDE (GLUCOTROL) 5 MG tablet Take 5 mg by  mouth daily.    Marland Kitchen glucose blood (ONETOUCH VERIO) test strip Use as instructed   . Melatonin 10 MG TABS Take 10 mg by mouth at bedtime.    . metFORMIN (GLUCOPHAGE) 500 MG tablet Take 1,000 mg by mouth daily.    Marland Kitchen  metoprolol succinate (TOPROL-XL) 50 MG 24 hr tablet Take 50 mg by mouth 2 (two) times daily. Take with or immediately following a meal.   . Multiple Vitamin (MULTIVITAMIN) tablet Take 1 tablet by mouth daily.   Marland Kitchen omeprazole (PRILOSEC) 20 MG capsule 1 PO 30 MINS PRIOR TO BREAKFAST.   Marland Kitchen OXYGEN Inhale 2 L into the lungs at bedtime.    . pravastatin (PRAVACHOL) 20 MG tablet Take 1 tablet (20 mg total) daily by mouth. (Patient taking differently: Take 20 mg by mouth at bedtime. )   . PROAIR HFA 108 (90 Base) MCG/ACT inhaler USE 1 TO 2 INHALATIONS EVERY 6 HOURS AS NEEDED FOR WHEEZING OR SHORTNESS OF BREATH (Patient taking differently: Inhale 1-2 puffs into the lungs every 6 (six) hours as needed for wheezing or shortness of breath. )   . Probiotic Product (PROBIOTIC DAILY PO) Take 1 capsule by mouth daily.    Marland Kitchen tiotropium (SPIRIVA) 18 MCG inhalation capsule Place 18 mcg into inhaler and inhale daily.   . vitamin B-12 (CYANOCOBALAMIN) 1000 MCG tablet Take 1,000 mcg by mouth daily.   Marland Kitchen acetaminophen (TYLENOL) 500 MG tablet Take 1,000 mg by mouth every 6 (six) hours as needed for moderate pain or headache.    . [DISCONTINUED] bismuth-metronidazole-tetracycline (PYLERA) 140-125-125 MG capsule 3 PO QID FOR 10 DAYS    No facility-administered encounter medications on file as of 06/10/2019.     ALLERGIES:  Allergies  Allergen Reactions  . Other Swelling and Other (See Comments)    Local arm swelling- TB skin Test  . Trazodone And Nefazodone Hives and Other (See Comments)    "blisters my skin"   . Nickel Other (See Comments)    "oozing"  . Tetanus Toxoids Other (See Comments)    Left a knot  . Adhesive [Tape] Itching  . Bactrim [Sulfamethoxazole-Trimethoprim] Itching and Rash  . Penicillins  Itching, Rash and Other (See Comments)    Has patient had a PCN reaction causing immediate rash, facial/tongue/throat swelling, SOB or lightheadedness with hypotension: Unknown Has patient had a PCN reaction causing severe rash involving mucus membranes or skin necrosis: Unknown Has patient had a PCN reaction that required hospitalization: No Has patient had a PCN reaction occurring within the last 10 years: No If all of the above answers are "NO", then may proceed with Cephalosporin use  . Ppd [Tuberculin Purified Protein Derivative] Swelling and Other (See Comments)    Local arm swelling  . Sulfamethoxazole-Trimethoprim Itching and Rash     PHYSICAL EXAM:  ECOG Performance status: 1  Vitals:   06/10/19 1416  BP: (!) 141/63  Pulse: 73  Resp: 18  Temp: (!) 96.9 F (36.1 C)  SpO2: 97%   Filed Weights   06/10/19 1416  Weight: 232 lb 3.2 oz (105.3 kg)    Physical Exam Vitals signs reviewed.  Constitutional:      Appearance: Normal appearance.  Cardiovascular:     Rate and Rhythm: Normal rate and regular rhythm.     Heart sounds: Normal heart sounds.  Pulmonary:     Effort: Pulmonary effort is normal.     Breath sounds: Normal breath sounds.  Abdominal:     General: There is no distension.     Palpations: Abdomen is soft. There is no mass.  Skin:    General: Skin is warm.  Neurological:     General: No focal deficit present.     Mental Status: She is alert and oriented to person, place, and time.  Psychiatric:        Mood and Affect: Mood normal.        Behavior: Behavior normal.      LABORATORY DATA:  I have reviewed the labs as listed.  CBC    Component Value Date/Time   WBC 4.5 06/03/2019 1303   RBC 3.73 (L) 06/03/2019 1303   HGB 13.0 06/03/2019 1303   HGB 11.2 09/24/2017 1550   HCT 40.0 06/03/2019 1303   HCT 38.1 09/24/2017 1550   PLT 98 (L) 06/03/2019 1303   PLT 155 09/24/2017 1550   MCV 107.2 (H) 06/03/2019 1303   MCV 91 09/24/2017 1550   MCH  34.9 (H) 06/03/2019 1303   MCHC 32.5 06/03/2019 1303   RDW 14.2 06/03/2019 1303   RDW 18.5 (H) 09/24/2017 1550   LYMPHSABS 1.0 06/03/2019 1303   LYMPHSABS 1.3 09/24/2017 1550   MONOABS 0.3 06/03/2019 1303   EOSABS 0.1 06/03/2019 1303   EOSABS 0.1 09/24/2017 1550   BASOSABS 0.0 06/03/2019 1303   BASOSABS 0.0 09/24/2017 1550   CMP Latest Ref Rng & Units 06/03/2019 02/26/2019 10/18/2018  Glucose 70 - 99 mg/dL 127(H) 164(H) 137(H)  BUN 8 - 23 mg/dL 14 17 20   Creatinine 0.44 - 1.00 mg/dL 0.78 0.95 0.88  Sodium 135 - 145 mmol/L 143 139 139  Potassium 3.5 - 5.1 mmol/L 3.8 3.9 4.3  Chloride 98 - 111 mmol/L 106 101 99  CO2 22 - 32 mmol/L 31 26 33(H)  Calcium 8.9 - 10.3 mg/dL 9.6 9.5 10.4(H)  Total Protein 6.5 - 8.1 g/dL 6.3(L) 6.8 7.9  Total Bilirubin 0.3 - 1.2 mg/dL 1.3(H) 1.4(H) 1.6(H)  Alkaline Phos 38 - 126 U/L 94 87 95  AST 15 - 41 U/L 32 28 34  ALT 0 - 44 U/L 23 20 23        DIAGNOSTIC IMAGING:  I have independently reviewed the scans and discussed with the patient.   I have reviewed Linda Lick LPN's note and agree with the documentation.  I personally performed a face-to-face visit, made revisions and my assessment and plan is as follows.    ASSESSMENT & PLAN:   IDA (iron deficiency anemia) 1.  Iron deficiency anemia: -Colonoscopy on 04/08/2019 shows normal ileum, moderate diverticulosis in the rectosigmoid colon, external and internal hemorrhoids.  Pathology consistent with tubular adenomas. - Feraheme on 03/07/2019 and 03/17/2019. - She denies any bleeding per rectum or melena.  However she complains of feeling very tired. -We reviewed her blood work.  Hemoglobin is 13.  Ferritin is 82. - Because of her severe fatigue, I have recommended 1 infusion of Feraheme. -We will see her back in 3 months for follow-up.  2.  Thrombocytopenia: - Last 2 CBC shows mild to moderate thrombocytopenia.  Latest platelet count is 98.  She denies any easy bruising or bleeding. - I have  reviewed her platelets over the last several years.  She had one episode of low platelet count in April 2019. - I plan to repeat platelet count at next visit.  We will also check Q22, folic acid, copper levels. -Differential diagnosis includes immune mediated thrombocytopenia versus MDS.  3.  Atrial fibrillation: -She will continue Eliquis twice daily.   Total time spent is 25 minutes with more than 50% of the time spent face-to-face discussing lab results, treatment plan, counseling and coordination of care.  Orders placed this encounter:  Orders Placed This Encounter  Procedures  . CBC with Differential/Platelet  . Iron and TIBC  . Ferritin  .  Vitamin B12  . Folate  . Methylmalonic acid, serum  . Protein electrophoresis, serum  . Copper, serum  . Reticulocytes  . Lactate dehydrogenase      Derek Jack, MD [place] [phone]

## 2019-06-13 ENCOUNTER — Other Ambulatory Visit: Payer: Self-pay

## 2019-06-13 ENCOUNTER — Inpatient Hospital Stay (HOSPITAL_COMMUNITY): Payer: Medicare Other

## 2019-06-13 VITALS — BP 133/65 | HR 66 | Temp 97.4°F | Resp 18

## 2019-06-13 DIAGNOSIS — D500 Iron deficiency anemia secondary to blood loss (chronic): Secondary | ICD-10-CM

## 2019-06-13 DIAGNOSIS — I10 Essential (primary) hypertension: Secondary | ICD-10-CM | POA: Diagnosis not present

## 2019-06-13 DIAGNOSIS — R5383 Other fatigue: Secondary | ICD-10-CM | POA: Diagnosis not present

## 2019-06-13 DIAGNOSIS — J449 Chronic obstructive pulmonary disease, unspecified: Secondary | ICD-10-CM | POA: Diagnosis not present

## 2019-06-13 DIAGNOSIS — D696 Thrombocytopenia, unspecified: Secondary | ICD-10-CM | POA: Diagnosis not present

## 2019-06-13 DIAGNOSIS — E785 Hyperlipidemia, unspecified: Secondary | ICD-10-CM | POA: Diagnosis not present

## 2019-06-13 DIAGNOSIS — D509 Iron deficiency anemia, unspecified: Secondary | ICD-10-CM | POA: Diagnosis not present

## 2019-06-13 MED ORDER — SODIUM CHLORIDE 0.9 % IV SOLN
510.0000 mg | Freq: Once | INTRAVENOUS | Status: AC
  Administered 2019-06-13: 510 mg via INTRAVENOUS
  Filled 2019-06-13: qty 510

## 2019-06-13 MED ORDER — SODIUM CHLORIDE 0.9 % IV SOLN
INTRAVENOUS | Status: DC
  Administered 2019-06-13: 11:00:00 via INTRAVENOUS

## 2019-06-13 NOTE — Progress Notes (Signed)
Feraheme given per orders. Patient tolerated it well without problems. Vitals stable and discharged home from clinic ambulatory. Follow up as scheduled.  

## 2019-06-13 NOTE — Patient Instructions (Signed)
[place] at [facility]  Discharge Instructions:  Linda [name] given today. _______________________________________________________________  Thank you for choosing [place] at [facility] to provide your oncology and hematology care.  To afford each patient quality time with our providers, please arrive at least 15 minutes before your scheduled appointment.  You need to re-schedule your appointment if you arrive 10 or more minutes late.  We strive to give you quality time with our providers, and arriving late affects you and other patients whose appointments are after yours.  Also, if you no show three or more times for appointments you may be dismissed from the clinic.  Again, thank you for choosing [place] at [place] hope is that these requests will allow you access to exceptional care and in a timely manner. _______________________________________________________________  If you have questions after your visit, please contact our office at [phone] between the hours of 8:30 a.m. and 5:00 p.m. Voicemails left after 4:30 p.m. will not be returned until the following business day. _______________________________________________________________  For prescription refill requests, have your pharmacy contact our office. _______________________________________________________________  Recommendations made by the consultant and any test results will be sent to your referring physician. _______________________________________________________________

## 2019-06-23 ENCOUNTER — Ambulatory Visit (INDEPENDENT_AMBULATORY_CARE_PROVIDER_SITE_OTHER): Payer: Medicare Other | Admitting: *Deleted

## 2019-06-23 ENCOUNTER — Telehealth: Payer: Self-pay | Admitting: Internal Medicine

## 2019-06-23 DIAGNOSIS — I4891 Unspecified atrial fibrillation: Secondary | ICD-10-CM

## 2019-06-23 NOTE — Telephone Encounter (Signed)
Virtual Visit Pre-Appointment Phone Call  "(Name), I am calling you today to discuss your upcoming appointment. We are currently trying to limit exposure to the virus that causes COVID-19 by seeing patients at home rather than in the office."  1. "What is the BEST phone number to call the day of the visit?" - include this in appointment notes  2. Do you have or have access to (through a family member/friend) a smartphone with video capability that we can use for your visit?" a. If yes - list this number in appt notes as cell (if different from BEST phone #) and list the appointment type as a VIDEO visit in appointment notes b. If no - list the appointment type as a PHONE visit in appointment notes  3. Confirm consent - "In the setting of the current Covid19 crisis, you are scheduled for a (phone or video) visit with your provider on (date) at (time).  Just as we do with many in-office visits, in order for you to participate in this visit, we must obtain consent.  If you'd like, I can send this to your mychart (if signed up) or email for you to review.  Otherwise, I can obtain your verbal consent now.  All virtual visits are billed to your insurance company just like a normal visit would be.  By agreeing to a virtual visit, we'd like you to understand that the technology does not allow for your provider to perform an examination, and thus may limit your provider's ability to fully assess your condition. If your provider identifies any concerns that need to be evaluated in person, we will make arrangements to do so.  Finally, though the technology is pretty good, we cannot assure that it will always work on either your or our end, and in the setting of a video visit, we may have to convert it to a phone-only visit.  In either situation, we cannot ensure that we have a secure connection.  Are you willing to proceed?" STAFF: Did the patient verbally acknowledge consent to telehealth visit? Document  YES/NO here: yes   4. Advise patient to be prepared - "Two hours prior to your appointment, go ahead and check your blood pressure, pulse, oxygen saturation, and your weight (if you have the equipment to check those) and write them all down. When your visit starts, your provider will ask you for this information. If you have an Apple Watch or Kardia device, please plan to have heart rate information ready on the day of your appointment. Please have a pen and paper handy nearby the day of the visit as well."  5. Give patient instructions for MyChart download to smartphone OR Doximity/Doxy.me as below if video visit (depending on what platform provider is using)  6. Inform patient they will receive a phone call 15 minutes prior to their appointment time (may be from unknown caller ID) so they should be prepared to answer    TELEPHONE CALL NOTE  Linda [name] has been deemed a candidate for a follow-up tele-health visit to limit community exposure during the Covid-19 pandemic. I spoke with the patient via phone to ensure availability of phone/video source, confirm preferred email & phone number, and discuss instructions and expectations.  I reminded Linda [name] to be prepared with any vital sign and/or heart rhythm information that could potentially be obtained via home monitoring, at the time of her visit. I reminded Linda [name] to expect a phone call prior  to her visit.  Linda [name] 06/23/2019 10:28 AM   INSTRUCTIONS FOR DOWNLOADING THE MYCHART APP TO SMARTPHONE  - The patient must first make sure to have activated MyChart and know their login information - If Apple, go to [employer] and type in MyChart in the search bar and download the app. If Android, ask patient to go to [employer] and type in [place] in the search bar and download the app. The app is free but as with any other app downloads, their phone may require them to verify saved payment information or  Apple/Android password.  - The patient will need to then log into the app with their MyChart username and password, and select [facility] as their healthcare provider to link the account. When it is time for your visit, go to the MyChart app, find appointments, and click Begin Video Visit. Be sure to Select Allow for your device to access the Microphone and Camera for your visit. You will then be connected, and your provider will be with you shortly.  **If they have any issues connecting, or need assistance please contact MyChart service desk [phone])**  **If using a computer, in order to ensure the best quality for their visit they will need to use either of the following Internet Browsers: [employer], or Google Chrome**  IF USING DOXIMITY or DOXY.ME - The patient will receive a link just prior to their visit by text.     FULL LENGTH CONSENT FOR TELE-HEALTH VISIT   I hereby voluntarily request, consent and authorize [facility] and its employed or contracted physicians, physician assistants, nurse practitioners or other licensed health care professionals (the Practitioner), to provide me with telemedicine health care services (the Services") as deemed necessary by the treating Practitioner. I acknowledge and consent to receive the Services by the Practitioner via telemedicine. I understand that the telemedicine visit will involve communicating with the Practitioner through live audiovisual communication technology and the disclosure of certain medical information by electronic transmission. I acknowledge that I have been given the opportunity to request an in-person assessment or other available alternative prior to the telemedicine visit and am voluntarily participating in the telemedicine visit.  I understand that I have the right to withhold or withdraw my consent to the use of telemedicine in the course of my care at any time, without affecting my right to future care  or treatment, and that the Practitioner or I may terminate the telemedicine visit at any time. I understand that I have the right to inspect all information obtained and/or recorded in the course of the telemedicine visit and may receive copies of available information for a reasonable fee.  I understand that some of the potential risks of receiving the Services via telemedicine include:   Delay or interruption in medical evaluation due to technological equipment failure or disruption;  Information transmitted may not be sufficient (e.g. poor resolution of images) to allow for appropriate medical decision making by the Practitioner; and/or   In rare instances, security protocols could fail, causing a breach of personal health information.  Furthermore, I acknowledge that it is my responsibility to provide information about my medical history, conditions and care that is complete and accurate to the best of my ability. I acknowledge that Practitioner's advice, recommendations, and/or decision may be based on factors not within their control, such as incomplete or inaccurate data provided by me or distortions of diagnostic images or specimens that may result from electronic transmissions.  I understand that the practice of medicine is not an exact science and that Practitioner makes no warranties or guarantees regarding treatment outcomes. I acknowledge that I will receive a copy of this consent concurrently upon execution via email to the email address I last provided but may also request a printed copy by calling the office of [facility].    I understand that my insurance will be billed for this visit.   I have read or had this consent read to me.  I understand the contents of this consent, which adequately explains the benefits and risks of the Services being provided via telemedicine.   I have been provided ample opportunity to ask questions regarding this consent and the Services and have had  my questions answered to my satisfaction.  I give my informed consent for the services to be provided through the use of telemedicine in my medical care  By participating in this telemedicine visit I agree to the above.

## 2019-06-24 LAB — CUP PACEART REMOTE DEVICE CHECK
Date Time Interrogation Session: 20200929181324
Implantable Pulse Generator Implant Date: 20190212

## 2019-06-27 ENCOUNTER — Encounter: Payer: Self-pay | Admitting: Internal Medicine

## 2019-06-27 ENCOUNTER — Telehealth (INDEPENDENT_AMBULATORY_CARE_PROVIDER_SITE_OTHER): Payer: Medicare Other | Admitting: Internal Medicine

## 2019-06-27 VITALS — BP 141/63 | Ht 62.5 in | Wt 232.2 lb

## 2019-06-27 DIAGNOSIS — I480 Paroxysmal atrial fibrillation: Secondary | ICD-10-CM | POA: Diagnosis not present

## 2019-06-27 DIAGNOSIS — G4733 Obstructive sleep apnea (adult) (pediatric): Secondary | ICD-10-CM | POA: Diagnosis not present

## 2019-06-27 DIAGNOSIS — I255 Ischemic cardiomyopathy: Secondary | ICD-10-CM | POA: Diagnosis not present

## 2019-06-27 NOTE — Progress Notes (Signed)
Electrophysiology TeleHealth Note   Due to national recommendations of social distancing due to [place] 19, an audio telehealth visit is felt to be most appropriate for this patient at this time.  Verbal consent was obtained by me for the telehealth visit today.  The patient does not have capability for a virtual visit.  A phone visit is therefore required today.   Date:  06/27/2019   ID:  ASTHA [name], DOB 1945/11/23, MRN [id number]  Location: patient's home  Provider location:  [facility]  Evaluation Performed: Follow-up visit  PCP:  [name], MD   Electrophysiologist:  Dr Rayann Heman  Chief Complaint:  palpitations  History of Present Illness:    Linda [name] is a 73 y.o. female who presents via telehealth conferencing today.  Since last being seen in our clinic, the patient reports doing very well.  Today, she denies symptoms of palpitations, chest pain, shortness of breath,  lower extremity edema, dizziness, presyncope, or syncope.  The patient is otherwise without complaint today.  The patient denies symptoms of fevers, chills, cough, or new SOB worrisome for COVID 19.  Past Medical History:  Diagnosis Date   Allergy    Anemia 07/25/2017   Anxiety    Arthritis    Atrial fibrillation (HCC)    Cataract    COPD (chronic obstructive pulmonary disease) ([place])    Depression    Diabetes mellitus without complication ([place])    Emphysema of lung ([place])    Hyperlipidemia    Hypertension    Oxygen deficiency    Sleep apnea    Typical atrial flutter ([place])     Past Surgical History:  Procedure Laterality Date   A-FLUTTER ABLATION N/A 10/04/2017   Procedure: A-FLUTTER ABLATION;  Surgeon: Thompson Grayer, MD;  Location: [place] [facility];  Service: Cardiovascular;  Laterality: N/A;   ABDOMINAL HYSTERECTOMY     fibroids   blood clot removal from base of brain  1990   CARPAL TUNNEL RELEASE     CATARACT EXTRACTION W/PHACO Left 06/16/2013   Procedure:  CATARACT EXTRACTION PHACO AND INTRAOCULAR LENS PLACEMENT (IOC);  Surgeon: Tonny Branch, MD;  Location: [facility];  Service: Ophthalmology;  Laterality: Left;  CDE:  12.18   CATARACT EXTRACTION W/PHACO Right 06/26/2013   Procedure: CATARACT EXTRACTION PHACO AND INTRAOCULAR LENS PLACEMENT (IOC);  Surgeon: Tonny Branch, MD;  Location: [facility];  Service: Ophthalmology;  Laterality: Right;  CDE:14.71   CESAREAN SECTION     CHOLECYSTECTOMY     COLONOSCOPY WITH PROPOFOL N/A 04/08/2019   Procedure: COLONOSCOPY WITH PROPOFOL;  Surgeon: Danie Binder, MD;  Location: [facility];  Service: Endoscopy;  Laterality: N/A;  2:15pm   DILATION AND CURETTAGE OF UTERUS     ESOPHAGOGASTRODUODENOSCOPY (EGD) WITH PROPOFOL N/A 04/08/2019   Procedure: ESOPHAGOGASTRODUODENOSCOPY (EGD) WITH PROPOFOL;  Surgeon: Danie Binder, MD;  [facility];  Service: Endoscopy;  Laterality: N/A;   EYE SURGERY  12/2016   FOOT NEUROMA SURGERY Right    LOOP RECORDER INSERTION N/A 11/06/2017   Procedure: LOOP RECORDER INSERTION;  Surgeon: Thompson Grayer, MD;  Location: [place] [facility];  Service: Cardiovascular;  Laterality: N/A;   MOUTH SURGERY     POLYPECTOMY  04/08/2019   Procedure: POLYPECTOMY;  Surgeon: Danie Binder, MD;  [facility];  Service: Endoscopy;;   TONSILLECTOMY      Current Outpatient Medications  Medication Sig Dispense Refill   acetaminophen (TYLENOL) 500 MG tablet Take 1,000  mg by mouth every 6 (six) hours as needed for moderate pain or headache.      Alpha-Lipoic Acid 200 MG TABS Take 200 mg by mouth 2 (two) times daily.      apixaban (ELIQUIS) 5 MG TABS tablet Take 1 tablet (5 mg total) by mouth 2 (two) times daily. 180 tablet 3   cetirizine (ZYRTEC) 10 MG tablet Take 10 mg by mouth 2 (two) times daily.     Cholecalciferol (VITAMIN D) 50 MCG (2000 UT) CAPS Take 2,000 Units by mouth daily.      DULoxetine (CYMBALTA) 30 MG capsule Take 30 mg by mouth daily.      Fluticasone-Salmeterol (ADVAIR DISKUS) 250-50 MCG/DOSE AEPB Inhale 1 puff into the lungs 2 (two) times daily.     glipiZIDE (GLUCOTROL) 5 MG tablet Take 5 mg by mouth daily. May not take every day, depends on sugar.     glucose blood (ONETOUCH VERIO) test strip Use as instructed 100 each 0   Melatonin 10 MG TABS Take 10 mg by mouth at bedtime.      metFORMIN (GLUCOPHAGE) 500 MG tablet Take 1,000 mg by mouth daily.      metoprolol succinate (TOPROL-XL) 50 MG 24 hr tablet Take 50 mg by mouth 2 (two) times daily. Take with or immediately following a meal.     Multiple Vitamin (MULTIVITAMIN) tablet Take 1 tablet by mouth daily.     omeprazole (PRILOSEC) 20 MG capsule 1 PO 30 MINS PRIOR TO BREAKFAST. 90 capsule 3   OXYGEN Inhale 2 L into the lungs at bedtime.      pravastatin (PRAVACHOL) 20 MG tablet Take 1 tablet (20 mg total) daily by mouth. (Patient taking differently: Take 20 mg by mouth at bedtime. ) 90 tablet 3   PROAIR HFA 108 (90 Base) MCG/ACT inhaler USE 1 TO 2 INHALATIONS EVERY 6 HOURS AS NEEDED FOR WHEEZING OR SHORTNESS OF BREATH (Patient taking differently: Inhale 1-2 puffs into the lungs every 6 (six) hours as needed for wheezing or shortness of breath. ) 8.5 g 1   Probiotic Product (PROBIOTIC DAILY PO) Take 1 capsule by mouth daily.      tiotropium (SPIRIVA) 18 MCG inhalation capsule Place 18 mcg into inhaler and inhale daily.     vitamin B-12 (CYANOCOBALAMIN) 1000 MCG tablet Take 1,000 mcg by mouth daily.     No current facility-administered medications for this visit.     Allergies:   Other, Trazodone and nefazodone, Nickel, Tetanus toxoids, Adhesive [tape], Bactrim [sulfamethoxazole-trimethoprim], Penicillins, Ppd [tuberculin purified protein derivative], and Sulfamethoxazole-trimethoprim   Social History:  The patient  reports that she quit smoking about 18 years ago. Her smoking use included cigarettes. She started smoking about 60 years ago. She smoked 1.50 packs per  day. She has never used smokeless tobacco. She reports that she does not drink alcohol or use drugs.   Family History:  The patient's family history includes Alcohol abuse in her mother; Arthritis in her mother; COPD in her brother and mother; Cancer in her father; Depression in her mother; Diabetes in her brother; Emphysema in her mother; Heart attack in her father; Heart disease in her father; Hyperlipidemia in her mother; Hypertension in her mother; Other in her maternal grandmother.   ROS:  Please see the history of present illness.   All other systems are personally reviewed and negative.    Exam:    Vital Signs:  BP (!) 141/63    Ht 5' 2.5" (1.588 m)  Wt 232 lb 3.2 oz (105.3 kg)    BMI 41.79 kg/m   Well sounding, alert and conversant   Labs/Other Tests and Data Reviewed:    Recent Labs: 06/03/2019: ALT 23; BUN 14; Creatinine, Ser 0.78; Hemoglobin 13.0; Platelets 98; Potassium 3.8; Sodium 143   Wt Readings from Last 3 Encounters:  06/27/19 232 lb 3.2 oz (105.3 kg)  06/10/19 232 lb 3.2 oz (105.3 kg)  04/08/19 230 lb (104.3 kg)     Last device remote is reviewed from [facility] which reveals normal device function, afib burden is 1.6%   ASSESSMENT & PLAN:    1.  Afib Burden is 1.6% by ILR (previously 2%) She is on eliquis  2. Overweight Lifestyle modification is advised She is not very active.  3. OSA Does not comply with bipap I referred to Dr Maxwell Caul last year but she did not comply.  Follow-up:  12 months   Patient Risk:  after full review of this patients clinical status, I feel that they are at moderate risk at this time.  Today, I have spent 15 minutes with the patient with telehealth technology discussing arrhythmia management .    Army Fossa, MD  06/27/2019 8:41 AM     [facility] [address] [place] [address] [phone] (office) [phone] (fax)

## 2019-07-02 NOTE — Progress Notes (Signed)
Carelink Summary Report / Loop Recorder 

## 2019-07-27 LAB — CUP PACEART REMOTE DEVICE CHECK
Date Time Interrogation Session: 20201101181301
Implantable Pulse Generator Implant Date: 20190212

## 2019-07-28 ENCOUNTER — Ambulatory Visit (INDEPENDENT_AMBULATORY_CARE_PROVIDER_SITE_OTHER): Payer: Medicare Other | Admitting: *Deleted

## 2019-07-28 DIAGNOSIS — I4891 Unspecified atrial fibrillation: Secondary | ICD-10-CM

## 2019-07-29 DIAGNOSIS — Z0000 Encounter for general adult medical examination without abnormal findings: Secondary | ICD-10-CM | POA: Diagnosis not present

## 2019-07-29 DIAGNOSIS — Z1339 Encounter for screening examination for other mental health and behavioral disorders: Secondary | ICD-10-CM | POA: Diagnosis not present

## 2019-07-29 DIAGNOSIS — Z299 Encounter for prophylactic measures, unspecified: Secondary | ICD-10-CM | POA: Diagnosis not present

## 2019-07-29 DIAGNOSIS — J449 Chronic obstructive pulmonary disease, unspecified: Secondary | ICD-10-CM | POA: Diagnosis not present

## 2019-07-29 DIAGNOSIS — Z79899 Other long term (current) drug therapy: Secondary | ICD-10-CM | POA: Diagnosis not present

## 2019-07-29 DIAGNOSIS — E1165 Type 2 diabetes mellitus with hyperglycemia: Secondary | ICD-10-CM | POA: Diagnosis not present

## 2019-07-29 DIAGNOSIS — I10 Essential (primary) hypertension: Secondary | ICD-10-CM | POA: Diagnosis not present

## 2019-07-29 DIAGNOSIS — E785 Hyperlipidemia, unspecified: Secondary | ICD-10-CM | POA: Diagnosis not present

## 2019-07-29 DIAGNOSIS — Z6841 Body Mass Index (BMI) 40.0 and over, adult: Secondary | ICD-10-CM | POA: Diagnosis not present

## 2019-07-29 DIAGNOSIS — Z1331 Encounter for screening for depression: Secondary | ICD-10-CM | POA: Diagnosis not present

## 2019-07-29 DIAGNOSIS — Z7189 Other specified counseling: Secondary | ICD-10-CM | POA: Diagnosis not present

## 2019-08-08 IMAGING — MG DIGITAL SCREENING BILATERAL MAMMOGRAM WITH TOMO AND CAD
6 of 10 series · 6 of 30 positions shown · non-contrast
Comparison: Previous exam(s).

CLINICAL DATA: Screening.

EXAM:
DIGITAL SCREENING BILATERAL MAMMOGRAM WITH TOMO AND CAD

[R MLO synth-2D (1 of 2)]
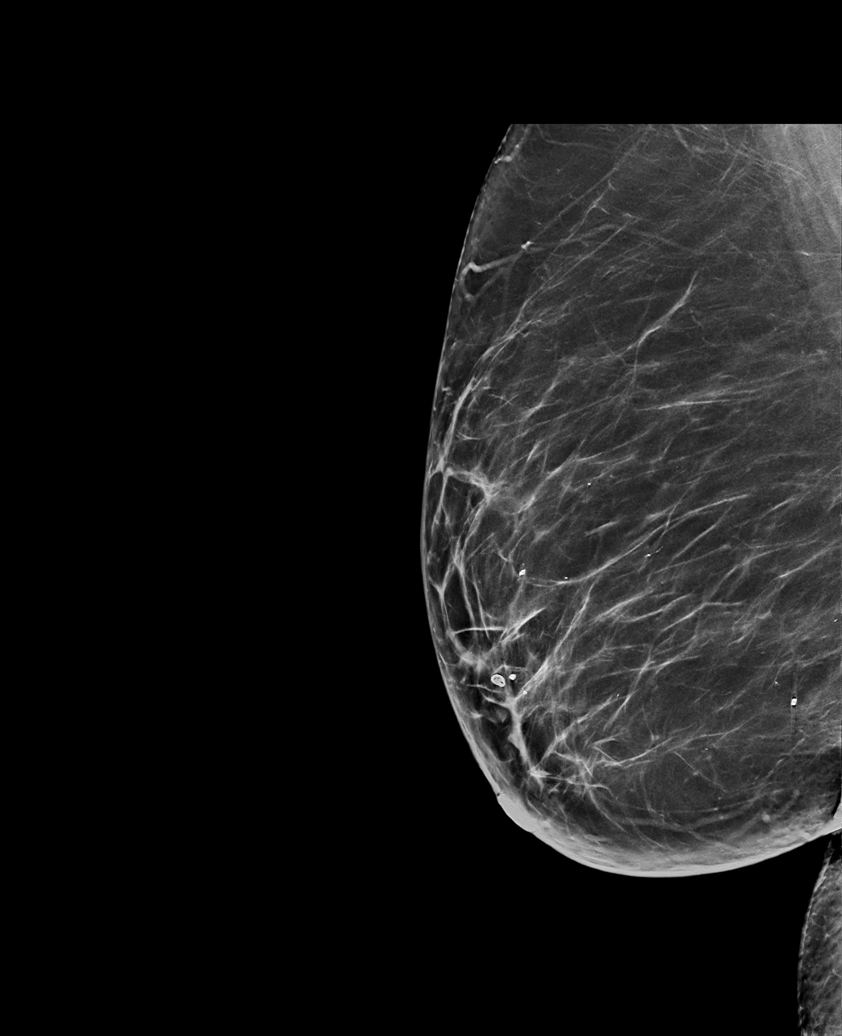

[L CC synth-2D]
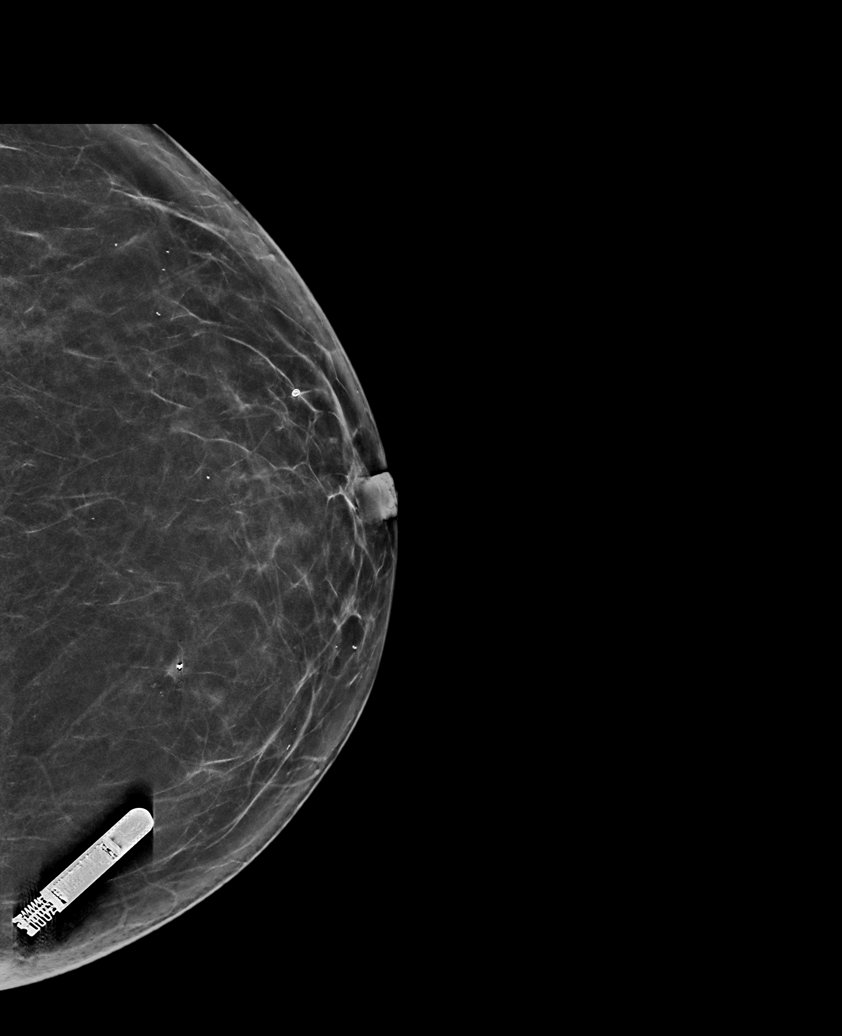

[R CC synth-2D]
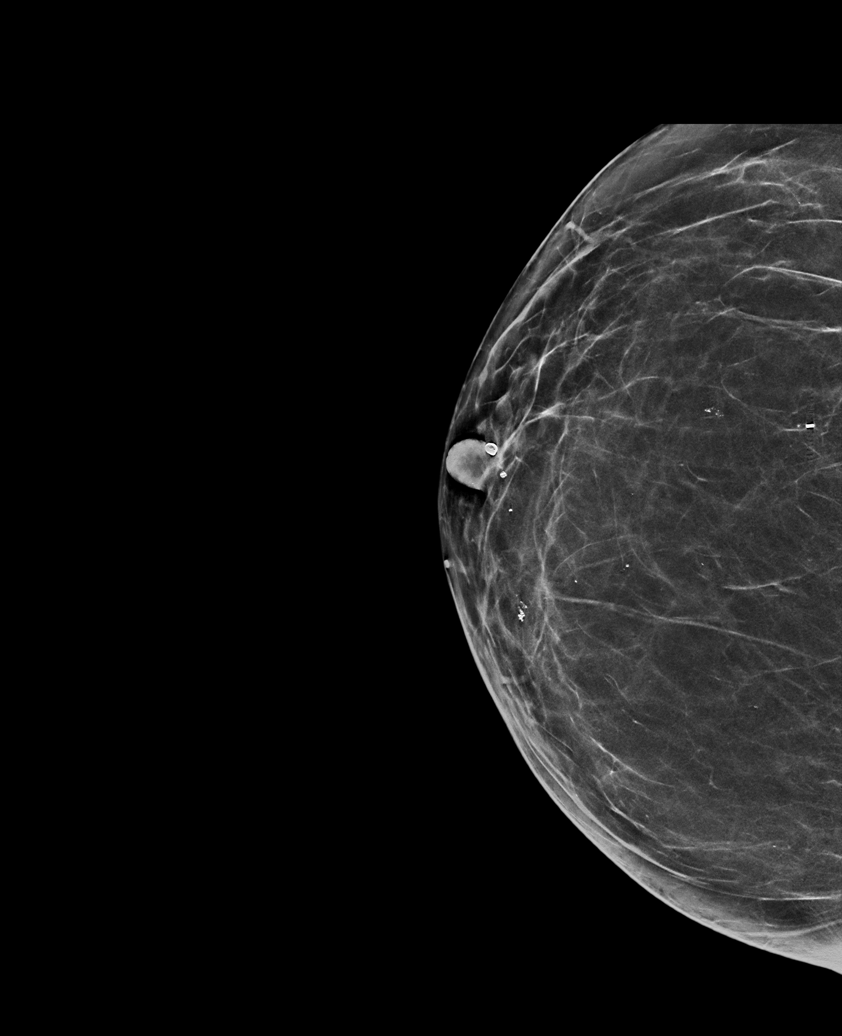

[L MLO synth-2D]
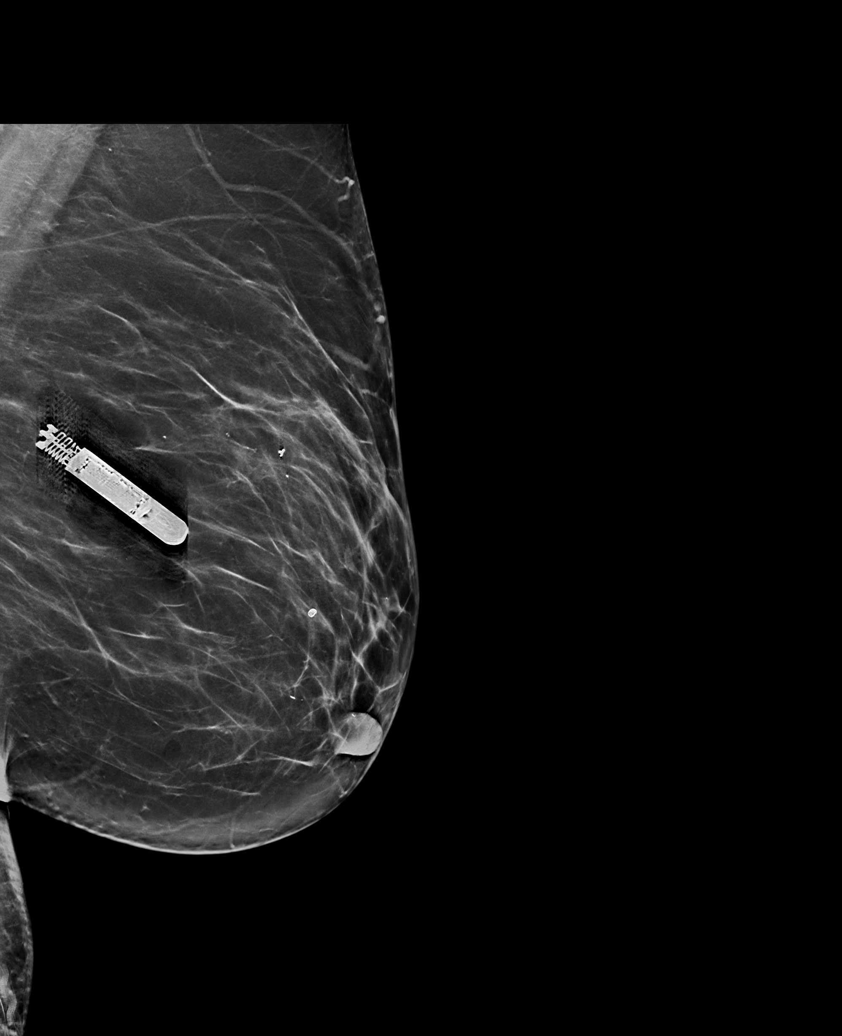

[R MLO synth-2D (2 of 2)]
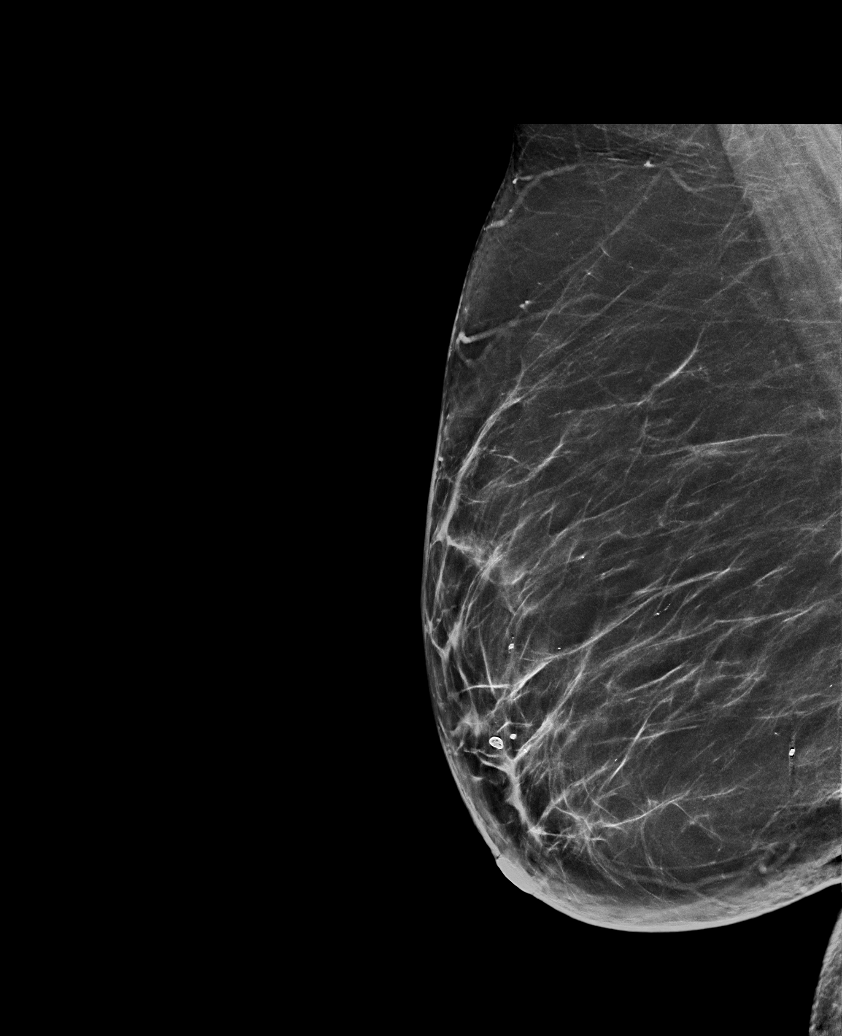

[L CC tomo · tomo slice 33/65.0]
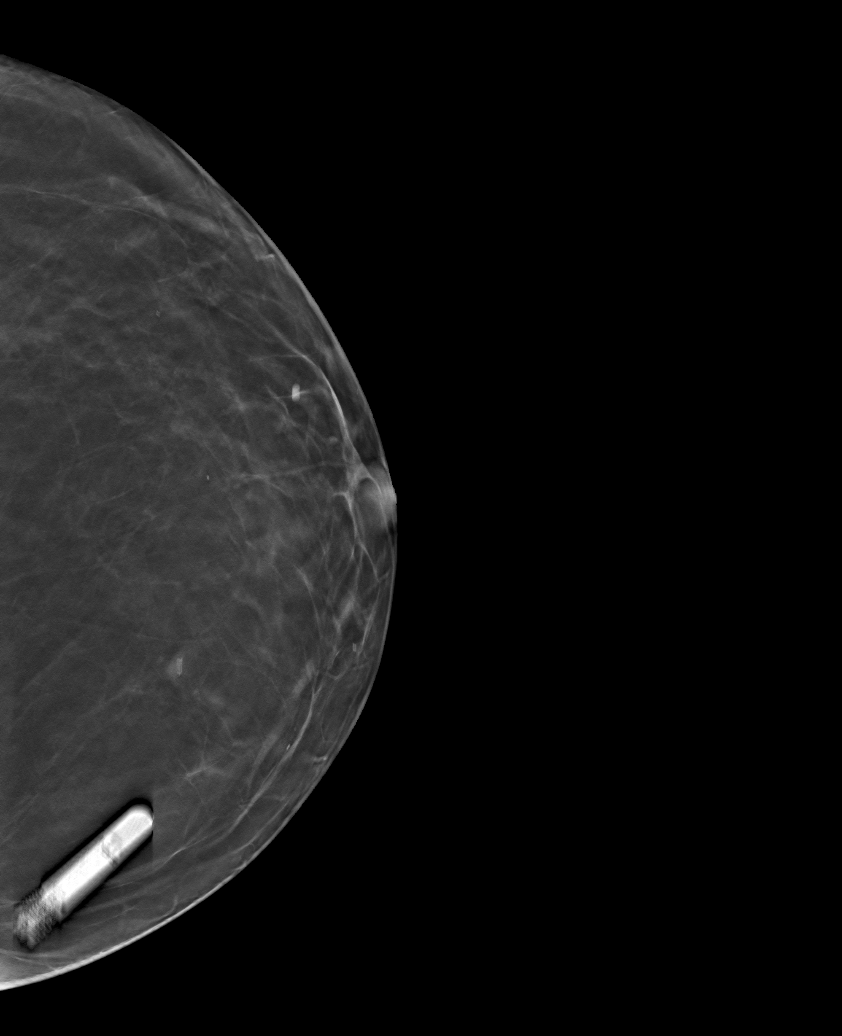

[6 of 30 positions shown; findings below may reference images not displayed]

ACR Breast Density Category b: There are scattered areas of
fibroglandular density.
FINDINGS: There are no findings suspicious for malignancy. Images were
processed with CAD.
IMPRESSION: No mammographic evidence of malignancy. A result letter of this
screening mammogram will be mailed directly to the patient.

RECOMMENDATION:
Screening mammogram in one year. (Code:[id number])

BI-RADS CATEGORY  1: Negative.

## 2019-08-19 NOTE — Progress Notes (Signed)
Carelink Summary Report / Loop Recorder 

## 2019-08-29 ENCOUNTER — Ambulatory Visit (INDEPENDENT_AMBULATORY_CARE_PROVIDER_SITE_OTHER): Payer: Medicare Other | Admitting: *Deleted

## 2019-08-29 DIAGNOSIS — I4891 Unspecified atrial fibrillation: Secondary | ICD-10-CM

## 2019-08-30 LAB — CUP PACEART REMOTE DEVICE CHECK
Date Time Interrogation Session: 20201204131915
Implantable Pulse Generator Implant Date: 20190212

## 2019-09-02 ENCOUNTER — Other Ambulatory Visit: Payer: Self-pay

## 2019-09-02 ENCOUNTER — Inpatient Hospital Stay (HOSPITAL_COMMUNITY): Payer: Medicare Other | Attending: Hematology

## 2019-09-02 DIAGNOSIS — D500 Iron deficiency anemia secondary to blood loss (chronic): Secondary | ICD-10-CM | POA: Insufficient documentation

## 2019-09-02 DIAGNOSIS — D696 Thrombocytopenia, unspecified: Secondary | ICD-10-CM | POA: Insufficient documentation

## 2019-09-02 LAB — CBC WITH DIFFERENTIAL/PLATELET
Abs Immature Granulocytes: 0.01 10*3/uL (ref 0.00–0.07)
Basophils Absolute: 0 10*3/uL (ref 0.0–0.1)
Basophils Relative: 0 %
Eosinophils Absolute: 0.2 10*3/uL (ref 0.0–0.5)
Eosinophils Relative: 4 %
HCT: 44.4 % (ref 36.0–46.0)
Hemoglobin: 13.9 g/dL (ref 12.0–15.0)
Immature Granulocytes: 0 %
Lymphocytes Relative: 20 %
Lymphs Abs: 1 10*3/uL (ref 0.7–4.0)
MCH: 34 pg (ref 26.0–34.0)
MCHC: 31.3 g/dL (ref 30.0–36.0)
MCV: 108.6 fL — ABNORMAL HIGH (ref 80.0–100.0)
Monocytes Absolute: 0.3 10*3/uL (ref 0.1–1.0)
Monocytes Relative: 6 %
Neutro Abs: 3.6 10*3/uL (ref 1.7–7.7)
Neutrophils Relative %: 70 %
Platelets: 110 10*3/uL — ABNORMAL LOW (ref 150–400)
RBC: 4.09 MIL/uL (ref 3.87–5.11)
RDW: 13.2 % (ref 11.5–15.5)
WBC: 5.1 10*3/uL (ref 4.0–10.5)
nRBC: 0 % (ref 0.0–0.2)

## 2019-09-02 LAB — FOLATE: Folate: 50.9 ng/mL (ref >5.9)

## 2019-09-02 LAB — RETICULOCYTES
Immature Retic Fract: 13.4 % (ref 2.3–15.9)
RBC.: 4.09 MIL/uL (ref 3.87–5.11)
Retic Count, Absolute: 107.2 10*3/uL (ref 19.0–186.0)
Retic Ct Pct: 2.6 % (ref 0.4–3.1)

## 2019-09-02 LAB — VITAMIN B12: Vitamin B-12: 1153 pg/mL — ABNORMAL HIGH (ref 180–914)

## 2019-09-02 LAB — IRON AND TIBC
Iron: 90 ug/dL (ref 28–170)
Saturation Ratios: 26 % (ref 10.4–31.8)
TIBC: 345 ug/dL (ref 250–450)
UIBC: 255 ug/dL

## 2019-09-02 LAB — FERRITIN: Ferritin: 129 ng/mL (ref 11–307)

## 2019-09-02 LAB — LACTATE DEHYDROGENASE: LDH: 142 U/L (ref 98–192)

## 2019-09-03 LAB — PROTEIN ELECTROPHORESIS, SERUM
A/G Ratio: 1.1 (ref 0.7–1.7)
Albumin ELP: 3.2 g/dL (ref 2.9–4.4)
Alpha-1-Globulin: 0.2 g/dL (ref 0.0–0.4)
Alpha-2-Globulin: 0.6 g/dL (ref 0.4–1.0)
Beta Globulin: 0.9 g/dL (ref 0.7–1.3)
Gamma Globulin: 1.1 g/dL (ref 0.4–1.8)
Globulin, Total: 2.9 g/dL (ref 2.2–3.9)
Total Protein ELP: 6.1 g/dL (ref 6.0–8.5)

## 2019-09-05 LAB — COPPER, SERUM: Copper: 135 ug/dL (ref 72–166)

## 2019-09-05 LAB — METHYLMALONIC ACID, SERUM: Methylmalonic Acid, Quantitative: 173 nmol/L (ref 0–378)

## 2019-09-09 ENCOUNTER — Encounter (HOSPITAL_COMMUNITY): Payer: Self-pay | Admitting: Hematology

## 2019-09-09 ENCOUNTER — Inpatient Hospital Stay (HOSPITAL_BASED_OUTPATIENT_CLINIC_OR_DEPARTMENT_OTHER): Payer: Medicare Other | Admitting: Hematology

## 2019-09-09 DIAGNOSIS — D696 Thrombocytopenia, unspecified: Secondary | ICD-10-CM | POA: Diagnosis not present

## 2019-09-09 DIAGNOSIS — Z79899 Other long term (current) drug therapy: Secondary | ICD-10-CM

## 2019-09-09 DIAGNOSIS — D500 Iron deficiency anemia secondary to blood loss (chronic): Secondary | ICD-10-CM

## 2019-09-09 DIAGNOSIS — Z7901 Long term (current) use of anticoagulants: Secondary | ICD-10-CM | POA: Diagnosis not present

## 2019-09-09 DIAGNOSIS — I4891 Unspecified atrial fibrillation: Secondary | ICD-10-CM | POA: Diagnosis not present

## 2019-09-09 NOTE — Progress Notes (Signed)
Virtual Visit via Telephone Note  I connected with Linda [name] on 09/09/19 at 11:30 AM EST by telephone and verified that I am speaking with the correct person using two identifiers.   I discussed the limitations, risks, security and privacy concerns of performing an evaluation and management service by telephone and the availability of in person appointments. I also discussed with the patient that there may be a patient responsible charge related to this service. The patient expressed understanding and agreed to proceed.   History of Present Illness: She is seen with [place] for thrombocytopenia and iron deficiency state.  Last Feraheme infusion was on 06/13/2019.  Colonoscopy on 04/08/2019 showed normal ileum, moderate diverticulosis in the rectosigmoid colon, external and internal hemorrhoids.  Pathology consistent with tubular adenomas.   Observations/Objective: She denies any bleeding per rectum or melena.  Her tiredness has not changed much after the last iron infusion.  Energy levels are reported as 50%.  Appetite is 100%.  Has minor sleep problems.  She continues to use oxygen at nighttime.  Denies any fevers or recent infections.  No night sweats or weight loss.  Assessment and Plan:  1.  Iron deficiency state: -Last Feraheme infusion on 06/13/2019. -We reviewed her labs.  Ferritin is 129.  Percent saturation is 26.  Folic acid, copper, F79, methylmalonic acid levels were normal.  SPEP was negative.  Hemoglobin was 13.9. -She does not require any parenteral iron at this time.  We will monitor her in 3 months.  2.  Mild to moderate thrombocytopenia: -Latest platelet count is 110, up from 98 3 months ago. -Nutritional deficiency work-up was normal.  SPEP was normal. -Differential diagnosis includes immune mediated thrombocytopenia versus MDS. -Will need further work-up if there is any worsening of platelet count to around 50,000.  3.  Atrial fibrillation: -We will continue Eliquis  twice daily.   Follow Up Instructions: RTC 3 months with labs 1 day prior.   I discussed the assessment and treatment plan with the patient. The patient was provided an opportunity to ask questions and all were answered. The patient agreed with the plan and demonstrated an understanding of the instructions.   The patient was advised to call back or seek an in-person evaluation if the symptoms worsen or if the condition fails to improve as anticipated.  I provided 12 minutes of non-face-to-face time during this encounter.   Derek Jack, MD

## 2019-10-10 ENCOUNTER — Ambulatory Visit: Payer: Medicare Other | Admitting: Gastroenterology

## 2019-10-13 ENCOUNTER — Encounter: Payer: Self-pay | Admitting: Gastroenterology

## 2019-10-13 NOTE — Progress Notes (Signed)
Primary Care Physician: [name], MD  Primary Gastroenterologist:  Barney Drain, MD   Chief Complaint  Patient presents with  . Anemia    f/u    HPI: Linda [name] is a 74 y.o. female here for f/u. She has h/o IDA, followed by hematology. Chronically on Eliquis. 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 Next colonoscopy in 3 years. She was found to have H.pylori gastritis as well.   Labs in 08/2019 with Hgb 13.9, MCV 108.6, folate normal. Iron 90, tibc 345, ferritin 129, B12 normal. Last feraheme 05/2019, 02/2019.    Every now and then will take Pepto-Bismol if GI upset. Usually happens if takes too many medicines at one time. BM regular. No melena, brbrp. No abdominal pain, no n/v.  Other than fatigue feels fine.    Current Outpatient Medications  Medication Sig Dispense Refill  . acetaminophen (TYLENOL) 500 MG tablet Take 1,000 mg by mouth every 6 (six) hours as needed for moderate pain or headache.     . Alpha-Lipoic Acid 200 MG TABS Take 200 mg by mouth 2 (two) times daily.     Marland Kitchen apixaban (ELIQUIS) 5 MG TABS tablet Take 1 tablet (5 mg total) by mouth 2 (two) times daily. 180 tablet 3  . Ascorbic Acid (VITAMIN C) 1000 MG tablet Take 1,000 mg by mouth daily.    . cetirizine (ZYRTEC) 10 MG tablet Take 10 mg by mouth 2 (two) times daily.    . Cholecalciferol (VITAMIN D) 50 MCG (2000 UT) CAPS Take 2,000 Units by mouth daily.     . Fluticasone-Salmeterol (ADVAIR DISKUS) 250-50 MCG/DOSE AEPB Inhale 1 puff into the lungs 2 (two) times daily.    Marland Kitchen glipiZIDE (GLUCOTROL) 5 MG tablet Take 5 mg by mouth daily. May not take every day, depends on sugar.    Marland Kitchen glucose blood (ONETOUCH VERIO) test strip Use as instructed 100 each 0  . magnesium oxide (MAG-OX) 400 MG tablet Take 400 mg by mouth daily.    . Melatonin 10 MG TABS Take 25 mg by mouth at bedtime.     . metFORMIN (GLUCOPHAGE) 500 MG tablet Take 1,000 mg by mouth daily.     . metoprolol succinate  (TOPROL-XL) 50 MG 24 hr tablet Take 50 mg by mouth 2 (two) times daily. Take with or immediately following a meal.    . Multiple Vitamin (MULTIVITAMIN) tablet Take 1 tablet by mouth daily.    . OXYGEN Inhale 2 L into the lungs at bedtime.     . pravastatin (PRAVACHOL) 20 MG tablet Take 1 tablet (20 mg total) daily by mouth. (Patient taking differently: Take 20 mg by mouth at bedtime. ) 90 tablet 3  . PROAIR HFA 108 (90 Base) MCG/ACT inhaler USE 1 TO 2 INHALATIONS EVERY 6 HOURS AS NEEDED FOR WHEEZING OR SHORTNESS OF BREATH 8.5 g 1  . Probiotic Product (PROBIOTIC DAILY PO) Take 1 capsule by mouth daily.     . Semaglutide (RYBELSUS) 3 MG TABS Take by mouth daily.    Marland Kitchen tiotropium (SPIRIVA) 18 MCG inhalation capsule Place 18 mcg into inhaler and inhale daily.    . vitamin B-12 (CYANOCOBALAMIN) 1000 MCG tablet Take 1,000 mcg by mouth daily.     No current facility-administered medications for this visit.    Allergies as of 10/14/2019 - Review Complete 10/14/2019  Allergen Reaction Noted  . Other Swelling and Other (See Comments) 06/09/2013  . Trazodone and nefazodone Hives and  Other (See Comments) 01/10/2017  . Nickel Other (See Comments) 10/04/2017  . Tetanus toxoids Other (See Comments) 06/28/2018  . Adhesive [tape] Itching 06/09/2013  . Bactrim [sulfamethoxazole-trimethoprim] Itching and Rash 06/09/2013  . Penicillins Itching, Rash, and Other (See Comments) 06/09/2013  . Ppd [tuberculin purified protein derivative] Swelling and Other (See Comments) 06/09/2013  . Sulfamethoxazole-trimethoprim Itching and Rash 06/09/2013    ROS:  General: Negative for anorexia, weight loss, fever, chills, fatigue, weakness. ENT: Negative for hoarseness, difficulty swallowing , nasal congestion. CV: Negative for chest pain, angina, palpitations, dyspnea on exertion, peripheral edema.  Respiratory: Negative for dyspnea at rest, dyspnea on exertion, cough, sputum, wheezing.  GI: See history of present  illness. GU:  Negative for dysuria, hematuria, urinary incontinence, urinary frequency, nocturnal urination.  Endo: Negative for unusual weight change.    Physical Examination:   BP 137/71   Pulse 75   Temp (!) 97 F (36.1 C) (Oral)   Ht 5\' 3"  (1.6 m)   Wt 232 lb (105.2 kg)   BMI 41.10 kg/m   General: Well-nourished, well-developed in no acute distress.  Eyes: No icterus. Mouth: masked Abdomen: Bowel sounds are normal, nontender, nondistended, no hepatosplenomegaly or masses, no abdominal bruits or hernia , no rebound or guarding.   Extremities: No lower extremity edema. No clubbing or deformities. Neuro: Alert and oriented x 4   Skin: Warm and dry, no jaundice.   Psych: Alert and cooperative, normal mood and affect.  Labs:  See HPI Imaging Studies: No results found.

## 2019-10-14 ENCOUNTER — Ambulatory Visit (INDEPENDENT_AMBULATORY_CARE_PROVIDER_SITE_OTHER): Payer: Medicare Other | Admitting: Gastroenterology

## 2019-10-14 ENCOUNTER — Encounter: Payer: Self-pay | Admitting: Gastroenterology

## 2019-10-14 ENCOUNTER — Other Ambulatory Visit: Payer: Self-pay

## 2019-10-14 VITALS — BP 137/71 | HR 75 | Temp 97.0°F | Ht 63.0 in | Wt 232.0 lb

## 2019-10-14 DIAGNOSIS — K2970 Gastritis, unspecified, without bleeding: Secondary | ICD-10-CM

## 2019-10-14 DIAGNOSIS — D509 Iron deficiency anemia, unspecified: Secondary | ICD-10-CM | POA: Diagnosis not present

## 2019-10-14 DIAGNOSIS — B9681 Helicobacter pylori [H. pylori] as the cause of diseases classified elsewhere: Secondary | ICD-10-CM | POA: Diagnosis not present

## 2019-10-14 NOTE — Assessment & Plan Note (Signed)
Completed Pylera.  Will check for H. pylori eradication.  Return to the office in 1 year.

## 2019-10-14 NOTE — Assessment & Plan Note (Signed)
Iron deficiency anemia.  Last iron infusion in September.  Labs in December with normal hemoglobin of 13.9, ferritin 129.  Colonoscopy and EGD as outlined above.  She will continue to follow with Dr. Delton Coombes, if further decline in iron her hemoglobin requiring ongoing iron infusions, would consider capsule endoscopy to complete work-up.  We will plan to see her back in a year unless she has any problems sooner.

## 2019-10-14 NOTE — Patient Instructions (Signed)
1. Please go to [facility] [place] for H.pylori stool test. Do not take antibiotics, Pepto, omeprazole for two weeks prior to stool collection. You can take TUMS if needed in this time frame for heartburn. 2. Continue to follow with Dr. Delton Coombes for your anemia.   3. Return to the office in one year or call sooner if needed.    This is website for [facility] vaccine appointments. Check back frequently if they are full.  [email or web address]  You can also call you local health department.

## 2019-10-17 DIAGNOSIS — K2970 Gastritis, unspecified, without bleeding: Secondary | ICD-10-CM | POA: Diagnosis not present

## 2019-10-17 DIAGNOSIS — B9681 Helicobacter pylori [H. pylori] as the cause of diseases classified elsewhere: Secondary | ICD-10-CM | POA: Diagnosis not present

## 2019-10-17 DIAGNOSIS — D509 Iron deficiency anemia, unspecified: Secondary | ICD-10-CM | POA: Diagnosis not present

## 2019-10-19 LAB — H. PYLORI ANTIGEN, STOOL: H pylori Ag, Stl: NEGATIVE

## 2019-10-23 DIAGNOSIS — Z299 Encounter for prophylactic measures, unspecified: Secondary | ICD-10-CM | POA: Diagnosis not present

## 2019-10-23 DIAGNOSIS — Z6841 Body Mass Index (BMI) 40.0 and over, adult: Secondary | ICD-10-CM | POA: Diagnosis not present

## 2019-10-23 DIAGNOSIS — J449 Chronic obstructive pulmonary disease, unspecified: Secondary | ICD-10-CM | POA: Diagnosis not present

## 2019-10-23 DIAGNOSIS — I4891 Unspecified atrial fibrillation: Secondary | ICD-10-CM | POA: Diagnosis not present

## 2019-10-23 DIAGNOSIS — I10 Essential (primary) hypertension: Secondary | ICD-10-CM | POA: Diagnosis not present

## 2019-10-23 DIAGNOSIS — E1165 Type 2 diabetes mellitus with hyperglycemia: Secondary | ICD-10-CM | POA: Diagnosis not present

## 2019-10-31 DIAGNOSIS — H35372 Puckering of macula, left eye: Secondary | ICD-10-CM | POA: Diagnosis not present

## 2019-11-06 ENCOUNTER — Telehealth: Payer: Self-pay

## 2019-11-06 NOTE — Telephone Encounter (Signed)
The pt had her monitor plugged into an electrical cord but it did not work. She moved the monitor into a wall outlet and the power of the monitor came on. We tried to send a transmission with the monitor but received the error code 3230. I called [employer] tech support and they stated if we let the monitor charge for 1 hour and try again it should work. I told the pt I will give her a call back at 1:30 to help her with sending the transmission.

## 2019-11-06 NOTE — Telephone Encounter (Signed)
[employer] is sending the pt a new reader. They advised her to call [employer] stay connect upon receiving the reader to get help sending the transmission. I asked the pt once she talk with [employer] to let me know she sent the transmission so I can update her chart. The pt verbalized understanding and thanked me for my help.

## 2019-11-10 DIAGNOSIS — L570 Actinic keratosis: Secondary | ICD-10-CM | POA: Diagnosis not present

## 2019-11-10 DIAGNOSIS — L02214 Cutaneous abscess of groin: Secondary | ICD-10-CM | POA: Diagnosis not present

## 2019-11-10 DIAGNOSIS — L720 Epidermal cyst: Secondary | ICD-10-CM | POA: Diagnosis not present

## 2019-11-12 ENCOUNTER — Telehealth: Payer: Self-pay | Admitting: *Deleted

## 2019-11-12 NOTE — Telephone Encounter (Signed)
   [facility] Pre-operative Risk Assessment    Request for surgical clearance:  1. What type of surgery is being performed? 1 TOOTH TO BE EXTRACTED    2. When is this surgery scheduled? TBD   3. What type of clearance is required (medical clearance vs. Pharmacy clearance to hold med vs. Both)? BOTH  4. Are there any medications that need to be held prior to surgery and how long? ELIQUIS   5. Practice name and name of physician performing surgery? [facility]; DR. Delfino Lovett [facility]   6. What is your office phone number [phone]    7.   What is your office fax number [phone]  8.   Anesthesia type (None, local, MAC, general) ? LOCAL   Julaine Hua 11/12/2019, 12:36 PM  _________________________________________________________________   (provider comments below)

## 2019-11-13 NOTE — Telephone Encounter (Signed)
   Primary Cardiologist: Kate Sable, MD  Chart reviewed as part of pre-operative protocol coverage. Simple dental extractions are considered low risk procedures per guidelines and generally do not require any specific cardiac clearance. It is also generally accepted that for simple extractions and dental cleanings, there is no need to interrupt blood thinner therapy.   SBE prophylaxis is not required for the patient.  I will route this recommendation to the requesting party via Epic fax function and remove from pre-op pool.  Please call with questions.  Ledora Bottcher, PA 11/13/2019, 9:31 AM

## 2019-11-17 DIAGNOSIS — E2839 Other primary ovarian failure: Secondary | ICD-10-CM | POA: Diagnosis not present

## 2019-11-19 DIAGNOSIS — E1165 Type 2 diabetes mellitus with hyperglycemia: Secondary | ICD-10-CM | POA: Diagnosis not present

## 2019-11-23 DIAGNOSIS — Z23 Encounter for immunization: Secondary | ICD-10-CM | POA: Diagnosis not present

## 2019-11-25 DIAGNOSIS — Z961 Presence of intraocular lens: Secondary | ICD-10-CM | POA: Diagnosis not present

## 2019-11-25 DIAGNOSIS — H524 Presbyopia: Secondary | ICD-10-CM | POA: Diagnosis not present

## 2019-11-25 DIAGNOSIS — H35373 Puckering of macula, bilateral: Secondary | ICD-10-CM | POA: Diagnosis not present

## 2019-11-25 DIAGNOSIS — E119 Type 2 diabetes mellitus without complications: Secondary | ICD-10-CM | POA: Diagnosis not present

## 2019-11-25 DIAGNOSIS — H179 Unspecified corneal scar and opacity: Secondary | ICD-10-CM | POA: Diagnosis not present

## 2019-11-25 DIAGNOSIS — H52223 Regular astigmatism, bilateral: Secondary | ICD-10-CM | POA: Diagnosis not present

## 2019-11-25 DIAGNOSIS — H5203 Hypermetropia, bilateral: Secondary | ICD-10-CM | POA: Diagnosis not present

## 2019-11-25 DIAGNOSIS — Z9842 Cataract extraction status, left eye: Secondary | ICD-10-CM | POA: Diagnosis not present

## 2019-11-25 NOTE — Telephone Encounter (Signed)
Pt monitor is up to date. Full transmission 12-10-2019

## 2019-12-02 ENCOUNTER — Ambulatory Visit (INDEPENDENT_AMBULATORY_CARE_PROVIDER_SITE_OTHER): Payer: Medicare Other | Admitting: *Deleted

## 2019-12-02 DIAGNOSIS — I4891 Unspecified atrial fibrillation: Secondary | ICD-10-CM

## 2019-12-03 ENCOUNTER — Telehealth: Payer: Self-pay

## 2019-12-03 LAB — CUP PACEART REMOTE DEVICE CHECK
Date Time Interrogation Session: 20210309140237
Date Time Interrogation Session: 20210310000500
Implantable Pulse Generator Implant Date: 20190212
Implantable Pulse Generator Implant Date: 20190212

## 2019-12-03 NOTE — Telephone Encounter (Signed)
Scheduled remote transmission on 3/9 already exported to MD for review.  [employer] alert received today 3/10 indicating recent AF episodes with RVR.  From trend report it appears pt had a long episode in the past couple of days.  No EGM available.  Current meds include Eliquis and Metoprolol 50mg  BID  Spoke with pt.  She confirmed current medications and reports that her PCP recently started her on Jardiance.  Since starting this new medication she has been experiencing dizzy spells especially with changes in position.  Her BP at last PCP office visit within the last week was 116/64.  She does not have ability to monitor this at home.  Requested pt send manual transmission to determine if additional EGMs available from this most recent, no additional EGMs avail for long AT/ AF episode with high Vrates just prior to 3/10.    Pt will f/u with PCP regarding possible Jardiance effects.  Advised will forward information about AF to MD for review, anticipate continued monitoring.

## 2019-12-03 NOTE — Progress Notes (Signed)
ILR Remote 

## 2019-12-04 DIAGNOSIS — D485 Neoplasm of uncertain behavior of skin: Secondary | ICD-10-CM | POA: Diagnosis not present

## 2019-12-04 DIAGNOSIS — L98499 Non-pressure chronic ulcer of skin of other sites with unspecified severity: Secondary | ICD-10-CM | POA: Diagnosis not present

## 2019-12-05 DIAGNOSIS — Z299 Encounter for prophylactic measures, unspecified: Secondary | ICD-10-CM | POA: Diagnosis not present

## 2019-12-05 DIAGNOSIS — I10 Essential (primary) hypertension: Secondary | ICD-10-CM | POA: Diagnosis not present

## 2019-12-05 DIAGNOSIS — E1165 Type 2 diabetes mellitus with hyperglycemia: Secondary | ICD-10-CM | POA: Diagnosis not present

## 2019-12-05 DIAGNOSIS — R42 Dizziness and giddiness: Secondary | ICD-10-CM | POA: Diagnosis not present

## 2019-12-05 DIAGNOSIS — Z6841 Body Mass Index (BMI) 40.0 and over, adult: Secondary | ICD-10-CM | POA: Diagnosis not present

## 2019-12-08 ENCOUNTER — Inpatient Hospital Stay (HOSPITAL_COMMUNITY): Payer: Medicare Other | Attending: Hematology

## 2019-12-08 ENCOUNTER — Other Ambulatory Visit: Payer: Self-pay

## 2019-12-08 DIAGNOSIS — Z79899 Other long term (current) drug therapy: Secondary | ICD-10-CM | POA: Insufficient documentation

## 2019-12-08 DIAGNOSIS — Z7901 Long term (current) use of anticoagulants: Secondary | ICD-10-CM | POA: Insufficient documentation

## 2019-12-08 DIAGNOSIS — Z87891 Personal history of nicotine dependence: Secondary | ICD-10-CM | POA: Insufficient documentation

## 2019-12-08 DIAGNOSIS — Z90710 Acquired absence of both cervix and uterus: Secondary | ICD-10-CM | POA: Insufficient documentation

## 2019-12-08 DIAGNOSIS — D500 Iron deficiency anemia secondary to blood loss (chronic): Secondary | ICD-10-CM

## 2019-12-08 DIAGNOSIS — D509 Iron deficiency anemia, unspecified: Secondary | ICD-10-CM | POA: Insufficient documentation

## 2019-12-08 DIAGNOSIS — D696 Thrombocytopenia, unspecified: Secondary | ICD-10-CM | POA: Diagnosis not present

## 2019-12-08 DIAGNOSIS — I4891 Unspecified atrial fibrillation: Secondary | ICD-10-CM | POA: Diagnosis not present

## 2019-12-08 LAB — CBC WITH DIFFERENTIAL/PLATELET
Abs Immature Granulocytes: 0.01 10*3/uL (ref 0.00–0.07)
Basophils Absolute: 0 10*3/uL (ref 0.0–0.1)
Basophils Relative: 0 %
Eosinophils Absolute: 0.2 10*3/uL (ref 0.0–0.5)
Eosinophils Relative: 3 %
HCT: 42.2 % (ref 36.0–46.0)
Hemoglobin: 13 g/dL (ref 12.0–15.0)
Immature Granulocytes: 0 %
Lymphocytes Relative: 25 %
Lymphs Abs: 1.2 10*3/uL (ref 0.7–4.0)
MCH: 32.4 pg (ref 26.0–34.0)
MCHC: 30.8 g/dL (ref 30.0–36.0)
MCV: 105.2 fL — ABNORMAL HIGH (ref 80.0–100.0)
Monocytes Absolute: 0.3 10*3/uL (ref 0.1–1.0)
Monocytes Relative: 7 %
Neutro Abs: 3 10*3/uL (ref 1.7–7.7)
Neutrophils Relative %: 65 %
Platelets: 109 10*3/uL — ABNORMAL LOW (ref 150–400)
RBC: 4.01 MIL/uL (ref 3.87–5.11)
RDW: 13.9 % (ref 11.5–15.5)
WBC: 4.7 10*3/uL (ref 4.0–10.5)
nRBC: 0 % (ref 0.0–0.2)

## 2019-12-08 LAB — IRON AND TIBC
Iron: 89 ug/dL (ref 28–170)
Saturation Ratios: 23 % (ref 10.4–31.8)
TIBC: 388 ug/dL (ref 250–450)
UIBC: 299 ug/dL

## 2019-12-08 LAB — FERRITIN: Ferritin: 25 ng/mL (ref 11–307)

## 2019-12-09 ENCOUNTER — Encounter (HOSPITAL_COMMUNITY): Payer: Self-pay | Admitting: Hematology

## 2019-12-09 ENCOUNTER — Inpatient Hospital Stay (HOSPITAL_BASED_OUTPATIENT_CLINIC_OR_DEPARTMENT_OTHER): Payer: Medicare Other | Admitting: Hematology

## 2019-12-09 ENCOUNTER — Other Ambulatory Visit: Payer: Self-pay

## 2019-12-09 VITALS — BP 120/60 | HR 64 | Temp 96.8°F | Resp 18 | Wt 224.0 lb

## 2019-12-09 DIAGNOSIS — D696 Thrombocytopenia, unspecified: Secondary | ICD-10-CM | POA: Diagnosis not present

## 2019-12-09 DIAGNOSIS — D500 Iron deficiency anemia secondary to blood loss (chronic): Secondary | ICD-10-CM

## 2019-12-09 DIAGNOSIS — Z79899 Other long term (current) drug therapy: Secondary | ICD-10-CM | POA: Diagnosis not present

## 2019-12-09 DIAGNOSIS — I4891 Unspecified atrial fibrillation: Secondary | ICD-10-CM | POA: Diagnosis not present

## 2019-12-09 DIAGNOSIS — Z87891 Personal history of nicotine dependence: Secondary | ICD-10-CM | POA: Diagnosis not present

## 2019-12-09 DIAGNOSIS — Z7901 Long term (current) use of anticoagulants: Secondary | ICD-10-CM | POA: Diagnosis not present

## 2019-12-09 DIAGNOSIS — D509 Iron deficiency anemia, unspecified: Secondary | ICD-10-CM | POA: Diagnosis not present

## 2019-12-09 NOTE — Assessment & Plan Note (Addendum)
1.  Iron deficiency anemia: -Colonoscopy on 04/08/2019 shows normal ileum, moderate diverticulosis in the rectosigmoid colon, external and internal hemorrhoids.  Pathology consistent with tubular adenomas. -Last Feraheme infusion was on 06/13/2019. -She denies any bleeding per rectum or melena.  She complains of feeling tired over the past few months. -Denies any recent infections or hospitalizations.  Had left groin cyst removed last week and was benign. -We reviewed blood work from 12/08/2019.  Ferritin decreased to 25 from 129 previously.  Hemoglobin dropped to 13 from 13.9. -Also reported shortness of breath on exertion.  Last CT chest from 01/05/2017 at [facility] shows mild centrilobular emphysema, enlarged main pulmonary artery, multiple pulmonary nodules measuring up to 5 mm. -She complains of tiredness.  Hence I have recommended 2 infusions of Feraheme. -I will reevaluate her in 4 months for follow-up with repeat labs.  2.  Thrombocytopenia: -She has mild to moderate thrombocytopenia.  Denies any easy bruising or bleeding. -Her platelet count on 12/08/2019 was 109, previously 110. -Differential diagnosis includes ITP versus MDS.  No further work-up needed at this time.  3.  Atrial fibrillation: -She will continue Eliquis twice daily.

## 2019-12-09 NOTE — Progress Notes (Signed)
[place], [place] [address]   CLINIC:  Medical Oncology/Hematology  PCP:  [name], MD [place] [address] [phone]   REASON FOR VISIT:  Follow-up for iron deficiency anemia.     INTERVAL HISTORY:  Ms. Tennant 74 y.o. female seen for follow-up of iron deficiency anemia.  Denies any bleeding per rectum or melena.  Reports feeling very tired over the past few months.  Appetite is 100%.  Energy levels are 50%.  Reports shortness of breath on exertion.  Had a cyst removed from the left groin area last week.  Numbness in the feet has been stable.  Denies any fevers or infections.  No recent ER visits or hospitalizations.   REVIEW OF SYSTEMS:  Review of Systems  Constitutional: Positive for fatigue.  Respiratory: Positive for shortness of breath.   Neurological: Positive for numbness.  All other systems reviewed and are negative.    PAST MEDICAL/SURGICAL HISTORY:  Past Medical History:  Diagnosis Date  . Allergy   . Anemia 07/25/2017  . Anxiety   . Arthritis   . Atrial fibrillation ([place])   . Cataract   . COPD (chronic obstructive pulmonary disease) ([place])   . Depression   . Diabetes mellitus without complication ([place])   . Emphysema of lung ([place])   . Hyperlipidemia   . Hypertension   . Oxygen deficiency   . Sleep apnea   . Typical atrial flutter [facility])    Past Surgical History:  Procedure Laterality Date  . A-FLUTTER ABLATION N/A 10/04/2017   Procedure: A-FLUTTER ABLATION;  Surgeon: Thompson Grayer, MD;  Location: [place];  Service: Cardiovascular;  Laterality: N/A;  . ABDOMINAL HYSTERECTOMY     fibroids  . blood clot removal from base of brain  1990  . CARPAL TUNNEL RELEASE    . CATARACT EXTRACTION W/PHACO Left 06/16/2013   Procedure: CATARACT EXTRACTION PHACO AND INTRAOCULAR LENS PLACEMENT (IOC);  Surgeon: Tonny Branch, MD;  Location: [facility];  Service: Ophthalmology;  Laterality: Left;  CDE:  12.18  .  CATARACT EXTRACTION W/PHACO Right 06/26/2013   Procedure: CATARACT EXTRACTION PHACO AND INTRAOCULAR LENS PLACEMENT (IOC);  Surgeon: Tonny Branch, MD;  Location: [facility];  Service: Ophthalmology;  Laterality: Right;  CDE:14.71  . CESAREAN SECTION    . CHOLECYSTECTOMY    . COLONOSCOPY WITH PROPOFOL N/A 04/08/2019   Dr.Fields: diverticulosis, 11 simple adenomas removed, hemorrhoids. next colonoscopy in 3 years.   Marland Kitchen DILATION AND CURETTAGE OF UTERUS    . ESOPHAGOGASTRODUODENOSCOPY (EGD) WITH PROPOFOL N/A 04/08/2019   Dr. Oneida Alar: H.pylori gastritis  . EYE SURGERY  12/2016  . FOOT NEUROMA SURGERY Right   . LOOP RECORDER INSERTION N/A 11/06/2017   Procedure: LOOP RECORDER INSERTION;  Surgeon: Thompson Grayer, MD;  Location: [place] [facility];  Service: Cardiovascular;  Laterality: N/A;  . MOUTH SURGERY    . POLYPECTOMY  04/08/2019   Procedure: POLYPECTOMY;  Surgeon: Danie Binder, MD;  Location: [facility];  Service: Endoscopy;;  . TONSILLECTOMY       SOCIAL HISTORY:  Social History   Socioeconomic History  . Marital status: Widowed    Spouse name: Not on file  . Number of children: 1  . Years of education: 73  . Highest education level: Not on file  Occupational History  . Occupation: retired    Comment: catering, Engineer, maintenance  Tobacco Use  . Smoking status: Former Smoker    Packs/day: 1.50  Types: Cigarettes    Start date: 09/25/1958    Quit date: 06/06/2001    Years since quitting: 18.5  . Smokeless tobacco: Never Used  Substance and Sexual Activity  . Alcohol use: No  . Drug use: No  . Sexual activity: Not Currently  Other Topics Concern  . Not on file  Social History Narrative   Lives alone   Son lives in [place]   TV, reads, puzzles   Social Determinants of Health   Financial Resource Strain:   . Difficulty of Paying Living Expenses:   Food Insecurity:   . Worried About Charity fundraiser in the Last Year:   . Arboriculturist in the Last Year:   Transportation  Needs:   . Film/video editor (Medical):   Marland Kitchen Lack of Transportation (Non-Medical):   Physical Activity:   . Days of Exercise per Week:   . Minutes of Exercise per Session:   Stress:   . Feeling of Stress :   Social Connections:   . Frequency of Communication with Friends and Family:   . Frequency of Social Gatherings with Friends and Family:   . Attends Religious Services:   . Active Member of Clubs or Organizations:   . Attends Archivist Meetings:   Marland Kitchen Marital Status:   Intimate Partner Violence:   . Fear of Current or Ex-Partner:   . Emotionally Abused:   Marland Kitchen Physically Abused:   . Sexually Abused:     FAMILY HISTORY:  Family History  Problem Relation Age of Onset  . Emphysema Mother   . Alcohol abuse Mother   . Arthritis Mother   . COPD Mother   . Depression Mother   . Hyperlipidemia Mother   . Hypertension Mother   . Heart attack Father   . Cancer Father        lung  . Heart disease Father   . Diabetes Brother   . COPD Brother   . Other Maternal Grandmother        swine flu 1919  . Colon cancer Neg Hx   . Colon polyps Neg Hx     CURRENT MEDICATIONS:  Outpatient Encounter Medications as of 12/09/2019  Medication Sig  . Alpha-Lipoic Acid 200 MG TABS Take 200 mg by mouth 2 (two) times daily.   Marland Kitchen apixaban (ELIQUIS) 5 MG TABS tablet Take 1 tablet (5 mg total) by mouth 2 (two) times daily.  . Ascorbic Acid (VITAMIN C) 1000 MG tablet Take 1,000 mg by mouth daily.  . calcium citrate-vitamin D (CITRACAL+D) 315-200 MG-UNIT tablet Take 1 tablet by mouth daily.  . calcium-vitamin D (OSCAL WITH D) 500-200 MG-UNIT tablet Take 1 tablet by mouth daily.  . cetirizine (ZYRTEC) 10 MG tablet Take 10 mg by mouth 2 (two) times daily.  . Cholecalciferol (VITAMIN D) 50 MCG (2000 UT) CAPS Take 2,000 Units by mouth daily.   . Fluticasone-Salmeterol (ADVAIR DISKUS) 250-50 MCG/DOSE AEPB Inhale 1 puff into the lungs 2 (two) times daily.  Marland Kitchen glipiZIDE (GLUCOTROL) 5 MG tablet  Take 5 mg by mouth 2 (two) times daily before a meal.   . JARDIANCE 10 MG TABS tablet Take 10 mg by mouth daily.  . magnesium (MAGTAB) 84 MG (7MEQ) TBCR SR tablet Take 80 mg by mouth daily.  . magnesium oxide (MAG-OX) 400 MG tablet Take 400 mg by mouth daily.  . Melatonin 10 MG TABS Take 25 mg by mouth at bedtime.   . metFORMIN (GLUCOPHAGE) 500  MG tablet Take 1,000 mg by mouth daily.   . metoprolol succinate (TOPROL-XL) 50 MG 24 hr tablet Take 50 mg by mouth 2 (two) times daily. Take with or immediately following a meal.  . Multiple Vitamin (MULTIVITAMIN) tablet Take 1 tablet by mouth daily.  . OXYGEN Inhale 2 L into the lungs at bedtime.   . pravastatin (PRAVACHOL) 20 MG tablet Take 1 tablet (20 mg total) daily by mouth. (Patient taking differently: Take 20 mg by mouth at bedtime. )  . Probiotic Product (PROBIOTIC DAILY PO) Take 1 capsule by mouth daily.   . Semaglutide (RYBELSUS) 3 MG TABS Take by mouth daily.  Marland Kitchen tiotropium (SPIRIVA) 18 MCG inhalation capsule Place 18 mcg into inhaler and inhale daily.  . vitamin B-12 (CYANOCOBALAMIN) 1000 MCG tablet Take 1,000 mcg by mouth daily.  Marland Kitchen zinc gluconate 50 MG tablet Take 50 mg by mouth daily.  Marland Kitchen acetaminophen (TYLENOL) 500 MG tablet Take 1,000 mg by mouth every 6 (six) hours as needed for moderate pain or headache.   . meclizine (ANTIVERT) 25 MG tablet Take 25 mg by mouth every 8 (eight) hours as needed.  . [DISCONTINUED] glucose blood (ONETOUCH VERIO) test strip Use as instructed  . [DISCONTINUED] PROAIR HFA 108 (90 Base) MCG/ACT inhaler USE 1 TO 2 INHALATIONS EVERY 6 HOURS AS NEEDED FOR WHEEZING OR SHORTNESS OF BREATH   No facility-administered encounter medications on file as of 12/09/2019.    ALLERGIES:  Allergies  Allergen Reactions  . Other Swelling and Other (See Comments)    Local arm swelling- TB skin Test  . Trazodone And Nefazodone Hives and Other (See Comments)    "blisters my skin"   . Nickel Other (See Comments)    "oozing"    . Tetanus Toxoids Other (See Comments)    Left a knot  . Adhesive [Tape] Itching  . Bactrim [Sulfamethoxazole-Trimethoprim] Itching and Rash  . Penicillins Itching, Rash and Other (See Comments)    Has patient had a PCN reaction causing immediate rash, facial/tongue/throat swelling, SOB or lightheadedness with hypotension: Unknown Has patient had a PCN reaction causing severe rash involving mucus membranes or skin necrosis: Unknown Has patient had a PCN reaction that required hospitalization: No Has patient had a PCN reaction occurring within the last 10 years: No If all of the above answers are "NO", then may proceed with Cephalosporin use  . Ppd [Tuberculin Purified Protein Derivative] Swelling and Other (See Comments)    Local arm swelling  . Sulfamethoxazole-Trimethoprim Itching and Rash     PHYSICAL EXAM:  ECOG Performance status: 1  Vitals:   12/09/19 1442  BP: 120/60  Pulse: 64  Resp: 18  Temp: (!) 96.8 F (36 C)  SpO2: 94%   Filed Weights   12/09/19 1442  Weight: 224 lb (101.6 kg)    Physical Exam Vitals reviewed.  Constitutional:      Appearance: Normal appearance.  Cardiovascular:     Rate and Rhythm: Normal rate and regular rhythm.     Heart sounds: Normal heart sounds.  Pulmonary:     Effort: Pulmonary effort is normal.     Breath sounds: Normal breath sounds.  Abdominal:     General: There is no distension.     Palpations: Abdomen is soft. There is no mass.  Skin:    General: Skin is warm.  Neurological:     General: No focal deficit present.     Mental Status: She is alert and oriented to person, place, and time.  Psychiatric:        Mood and Affect: Mood normal.        Behavior: Behavior normal.      LABORATORY DATA:  I have reviewed the labs as listed.  CBC    Component Value Date/Time   WBC 4.7 12/08/2019 1153   RBC 4.01 12/08/2019 1153   HGB 13.0 12/08/2019 1153   HGB 11.2 09/24/2017 1550   HCT 42.2 12/08/2019 1153   HCT 38.1  09/24/2017 1550   PLT 109 (L) 12/08/2019 1153   PLT 155 09/24/2017 1550   MCV 105.2 (H) 12/08/2019 1153   MCV 91 09/24/2017 1550   MCH 32.4 12/08/2019 1153   MCHC 30.8 12/08/2019 1153   RDW 13.9 12/08/2019 1153   RDW 18.5 (H) 09/24/2017 1550   LYMPHSABS 1.2 12/08/2019 1153   LYMPHSABS 1.3 09/24/2017 1550   MONOABS 0.3 12/08/2019 1153   EOSABS 0.2 12/08/2019 1153   EOSABS 0.1 09/24/2017 1550   BASOSABS 0.0 12/08/2019 1153   BASOSABS 0.0 09/24/2017 1550   CMP Latest Ref Rng & Units 06/03/2019 02/26/2019 10/18/2018  Glucose 70 - 99 mg/dL 127(H) 164(H) 137(H)  BUN 8 - 23 mg/dL 14 17 20   Creatinine 0.44 - 1.00 mg/dL 0.78 0.95 0.88  Sodium 135 - 145 mmol/L 143 139 139  Potassium 3.5 - 5.1 mmol/L 3.8 3.9 4.3  Chloride 98 - 111 mmol/L 106 101 99  CO2 22 - 32 mmol/L 31 26 33(H)  Calcium 8.9 - 10.3 mg/dL 9.6 9.5 10.4(H)  Total Protein 6.5 - 8.1 g/dL 6.3(L) 6.8 7.9  Total Bilirubin 0.3 - 1.2 mg/dL 1.3(H) 1.4(H) 1.6(H)  Alkaline Phos 38 - 126 U/L 94 87 95  AST 15 - 41 U/L 32 28 34  ALT 0 - 44 U/L 23 20 23        DIAGNOSTIC IMAGING:  I have independently reviewed scans.   I have reviewed Venita Lick LPN's note and agree with the documentation.  I personally performed a face-to-face visit, made revisions and my assessment and plan is as follows.    ASSESSMENT & PLAN:   IDA (iron deficiency anemia) 1.  Iron deficiency anemia: -Colonoscopy on 04/08/2019 shows normal ileum, moderate diverticulosis in the rectosigmoid colon, external and internal hemorrhoids.  Pathology consistent with tubular adenomas. -Last Feraheme infusion was on 06/13/2019. -She denies any bleeding per rectum or melena.  She complains of feeling tired over the past few months. -Denies any recent infections or hospitalizations.  Had left groin cyst removed last week and was benign. -We reviewed blood work from 12/08/2019.  Ferritin decreased to 25 from 129 previously.  Hemoglobin dropped to 13 from 13.9. -Also  reported shortness of breath on exertion.  Last CT chest [facility] shows mild centrilobular emphysema, enlarged main pulmonary artery, multiple pulmonary nodules measuring up to 5 mm. -She complains of tiredness.  Hence I have recommended 2 infusions of Feraheme. -I will reevaluate her in 4 months for follow-up with repeat labs.  2.  Thrombocytopenia: -She has mild to moderate thrombocytopenia.  Denies any easy bruising or bleeding. -Her platelet count on 12/08/2019 was 109, previously 110. -Differential diagnosis includes ITP versus MDS.  No further work-up needed at this time.  3.  Atrial fibrillation: -She will continue Eliquis twice daily.    Orders placed this encounter:  Orders Placed This Encounter  Procedures  . CBC with Differential/Platelet  . Comprehensive metabolic panel  . Iron and TIBC  . Ferritin  . Vitamin B12  .  Folate      Derek Jack, MD [place] [phone]

## 2019-12-09 NOTE — Patient Instructions (Addendum)
[place] at [facility] Discharge Instructions  You were seen today by Dr. Delton Coombes. He went over your recent lab results. He will schedule you for 2 weekly doses of IV iron. He will see you back in 4 months for labs and follow up.   Thank you for choosing [place] at [facility] to provide your oncology and hematology care.  To afford each patient quality time with our provider, please arrive at least 15 minutes before your scheduled appointment time.   If you have a lab appointment with the [facility] please come in thru the  Main Entrance and check in at the main information desk  You need to re-schedule your appointment should you arrive 10 or more minutes late.  We strive to give you quality time with our providers, and arriving late affects you and other patients whose appointments are after yours.  Also, if you no show three or more times for appointments you may be dismissed from the clinic at the providers discretion.     Again, thank you for choosing [facility].  Our hope is that these requests will decrease the amount of time that you wait before being seen by our physicians.       _____________________________________________________________  Should you have questions after your visit to [facility], please contact our office at [phone] between the hours of 8:00 a.m. and 4:30 p.m.  Voicemails left after 4:00 p.m. will not be returned until the following business day.  For prescription refill requests, have your pharmacy contact our office and allow 72 hours.    Cancer Center Support Programs:   > Cancer Support Group  2nd Tuesday of the month 1pm-2pm, Journey Room

## 2019-12-16 DIAGNOSIS — H81393 Other peripheral vertigo, bilateral: Secondary | ICD-10-CM | POA: Diagnosis not present

## 2019-12-19 ENCOUNTER — Inpatient Hospital Stay (HOSPITAL_COMMUNITY): Payer: Medicare Other

## 2019-12-19 ENCOUNTER — Encounter (HOSPITAL_COMMUNITY): Payer: Self-pay

## 2019-12-19 ENCOUNTER — Other Ambulatory Visit: Payer: Self-pay

## 2019-12-19 VITALS — BP 122/55 | HR 63 | Temp 97.3°F | Resp 18

## 2019-12-19 DIAGNOSIS — I4891 Unspecified atrial fibrillation: Secondary | ICD-10-CM | POA: Diagnosis not present

## 2019-12-19 DIAGNOSIS — Z7901 Long term (current) use of anticoagulants: Secondary | ICD-10-CM | POA: Diagnosis not present

## 2019-12-19 DIAGNOSIS — D500 Iron deficiency anemia secondary to blood loss (chronic): Secondary | ICD-10-CM

## 2019-12-19 DIAGNOSIS — D696 Thrombocytopenia, unspecified: Secondary | ICD-10-CM | POA: Diagnosis not present

## 2019-12-19 DIAGNOSIS — Z87891 Personal history of nicotine dependence: Secondary | ICD-10-CM | POA: Diagnosis not present

## 2019-12-19 DIAGNOSIS — Z79899 Other long term (current) drug therapy: Secondary | ICD-10-CM | POA: Diagnosis not present

## 2019-12-19 DIAGNOSIS — D509 Iron deficiency anemia, unspecified: Secondary | ICD-10-CM | POA: Diagnosis not present

## 2019-12-19 MED ORDER — SODIUM CHLORIDE 0.9 % IV SOLN
510.0000 mg | Freq: Once | INTRAVENOUS | Status: AC
  Administered 2019-12-19: 510 mg via INTRAVENOUS
  Filled 2019-12-19: qty 510

## 2019-12-19 MED ORDER — SODIUM CHLORIDE 0.9 % IV SOLN: Freq: Once | INTRAVENOUS | Status: AC

## 2019-12-19 MED ORDER — SODIUM CHLORIDE 0.9 % IV SOLN
Freq: Once | INTRAVENOUS | Status: DC
  Filled 2019-12-19: qty 10

## 2019-12-19 MED ORDER — SODIUM CHLORIDE 0.9% FLUSH
10.0000 mL | Freq: Once | INTRAVENOUS | Status: AC
  Administered 2019-12-19: 10 mL via INTRAVENOUS

## 2019-12-19 NOTE — Progress Notes (Signed)
Patient tolerated iron infusion with no complaints voiced.  Peripheral IV site clean and dry with good blood return noted before and after infusion.  Band aid applied.  VSS with discharge and left ambulatory with no s/s of distress noted.  

## 2019-12-21 DIAGNOSIS — Z23 Encounter for immunization: Secondary | ICD-10-CM | POA: Diagnosis not present

## 2019-12-23 DIAGNOSIS — E1165 Type 2 diabetes mellitus with hyperglycemia: Secondary | ICD-10-CM | POA: Diagnosis not present

## 2019-12-24 DIAGNOSIS — I10 Essential (primary) hypertension: Secondary | ICD-10-CM | POA: Diagnosis not present

## 2019-12-24 DIAGNOSIS — E119 Type 2 diabetes mellitus without complications: Secondary | ICD-10-CM | POA: Diagnosis not present

## 2019-12-28 NOTE — Telephone Encounter (Signed)
Continue to monitor

## 2019-12-30 ENCOUNTER — Encounter (HOSPITAL_COMMUNITY): Payer: Self-pay

## 2019-12-30 ENCOUNTER — Inpatient Hospital Stay (HOSPITAL_COMMUNITY): Payer: Medicare Other | Attending: Hematology

## 2019-12-30 ENCOUNTER — Other Ambulatory Visit: Payer: Self-pay

## 2019-12-30 VITALS — BP 106/53 | HR 62 | Temp 97.6°F | Resp 18

## 2019-12-30 DIAGNOSIS — D509 Iron deficiency anemia, unspecified: Secondary | ICD-10-CM | POA: Diagnosis not present

## 2019-12-30 DIAGNOSIS — D500 Iron deficiency anemia secondary to blood loss (chronic): Secondary | ICD-10-CM

## 2019-12-30 MED ORDER — SODIUM CHLORIDE 0.9 % IV SOLN
510.0000 mg | Freq: Once | INTRAVENOUS | Status: AC
  Administered 2019-12-30: 14:00:00 510 mg via INTRAVENOUS
  Filled 2019-12-30: qty 510

## 2019-12-30 MED ORDER — SODIUM CHLORIDE 0.9 % IV SOLN: Freq: Once | INTRAVENOUS | Status: AC

## 2019-12-30 NOTE — Patient Instructions (Signed)
[facility] at [facility] Discharge Instructions  Received Feraheme infusion today. Follow-up as scheduled. Call clinic for any questions or concerns   Thank you for choosing [facility] at [facility] to provide your oncology and hematology care.  To afford each patient quality time with our provider, please arrive at least 15 minutes before your scheduled appointment time.   If you have a lab appointment with the Cancer Center please come in thru the Main Entrance and check in at the main information desk.  You need to re-schedule your appointment should you arrive 10 or more minutes late.  We strive to give you quality time with our providers, and arriving late affects you and other patients whose appointments are after yours.  Also, if you no show three or more times for appointments you may be dismissed from the clinic at the providers discretion.     Again, thank you for choosing [facility].  Our hope is that these requests will decrease the amount of time that you wait before being seen by our physicians.       _____________________________________________________________  Should you have questions after your visit to [facility], please contact our office at [phone] between the hours of 8:00 a.m. and 4:30 p.m.  Voicemails left after 4:00 p.m. will not be returned until the following business day.  For prescription refill requests, have your pharmacy contact our office and allow 72 hours.    Due to Covid, you will need to wear a mask upon entering the hospital. If you do not have a mask, a mask will be given to you at the Main Entrance upon arrival. For doctor visits, patients may have 1 support person with them. For treatment visits, patients can not have anyone with them due to social distancing guidelines and our immunocompromised population.     

## 2019-12-30 NOTE — Progress Notes (Signed)
Linda [name] tolerated Feraheme infusion well without complaints or incident. Peripheral IV site checked with positive blood return noted prior to and after infusion. VSS upon discharge. Pt discharged self ambulatory using her cane in satisfactory condition

## 2019-12-31 DIAGNOSIS — Z299 Encounter for prophylactic measures, unspecified: Secondary | ICD-10-CM | POA: Diagnosis not present

## 2019-12-31 DIAGNOSIS — E119 Type 2 diabetes mellitus without complications: Secondary | ICD-10-CM | POA: Diagnosis not present

## 2019-12-31 DIAGNOSIS — R42 Dizziness and giddiness: Secondary | ICD-10-CM | POA: Diagnosis not present

## 2019-12-31 DIAGNOSIS — Z6841 Body Mass Index (BMI) 40.0 and over, adult: Secondary | ICD-10-CM | POA: Diagnosis not present

## 2019-12-31 DIAGNOSIS — I10 Essential (primary) hypertension: Secondary | ICD-10-CM | POA: Diagnosis not present

## 2020-01-02 ENCOUNTER — Ambulatory Visit (INDEPENDENT_AMBULATORY_CARE_PROVIDER_SITE_OTHER): Payer: Medicare Other | Admitting: *Deleted

## 2020-01-02 DIAGNOSIS — I4891 Unspecified atrial fibrillation: Secondary | ICD-10-CM | POA: Diagnosis not present

## 2020-01-03 LAB — CUP PACEART REMOTE DEVICE CHECK
Date Time Interrogation Session: 20210409150542
Implantable Pulse Generator Implant Date: 20190212

## 2020-01-05 NOTE — Progress Notes (Signed)
ILR Remote 

## 2020-01-07 ENCOUNTER — Telehealth: Payer: Self-pay | Admitting: *Deleted

## 2020-01-07 NOTE — Telephone Encounter (Signed)
Received fax from Dr Ferdie Ping office.  Pt needs 7 dental extractions to be done over 3 different visits.  Wants to know if it is OK to hold Eliquis.  Pt is on Eliquis for atrial fib/flutter.  CHADS2 score = 2 (HTN, DM).  OK to hold Eliquis 24 hours prior to each dental appointment.Resume Eliquis after procedure.  Faxed to dentist office at [phone].

## 2020-01-23 DIAGNOSIS — E1165 Type 2 diabetes mellitus with hyperglycemia: Secondary | ICD-10-CM | POA: Diagnosis not present

## 2020-01-23 DIAGNOSIS — I10 Essential (primary) hypertension: Secondary | ICD-10-CM | POA: Diagnosis not present

## 2020-01-23 DIAGNOSIS — F329 Major depressive disorder, single episode, unspecified: Secondary | ICD-10-CM | POA: Diagnosis not present

## 2020-02-02 LAB — CUP PACEART REMOTE DEVICE CHECK
Date Time Interrogation Session: 20210510151025
Implantable Pulse Generator Implant Date: 20190212

## 2020-02-04 ENCOUNTER — Ambulatory Visit (INDEPENDENT_AMBULATORY_CARE_PROVIDER_SITE_OTHER): Payer: Medicare Other | Admitting: *Deleted

## 2020-02-04 DIAGNOSIS — I4891 Unspecified atrial fibrillation: Secondary | ICD-10-CM

## 2020-02-06 NOTE — Progress Notes (Signed)
Carelink Summary Report / Loop Recorder 

## 2020-02-11 DIAGNOSIS — I10 Essential (primary) hypertension: Secondary | ICD-10-CM | POA: Diagnosis not present

## 2020-02-11 DIAGNOSIS — J449 Chronic obstructive pulmonary disease, unspecified: Secondary | ICD-10-CM | POA: Diagnosis not present

## 2020-02-11 DIAGNOSIS — Z299 Encounter for prophylactic measures, unspecified: Secondary | ICD-10-CM | POA: Diagnosis not present

## 2020-02-11 DIAGNOSIS — E1165 Type 2 diabetes mellitus with hyperglycemia: Secondary | ICD-10-CM | POA: Diagnosis not present

## 2020-02-22 DIAGNOSIS — E1165 Type 2 diabetes mellitus with hyperglycemia: Secondary | ICD-10-CM | POA: Diagnosis not present

## 2020-02-23 DIAGNOSIS — I10 Essential (primary) hypertension: Secondary | ICD-10-CM | POA: Diagnosis not present

## 2020-02-23 DIAGNOSIS — F329 Major depressive disorder, single episode, unspecified: Secondary | ICD-10-CM | POA: Diagnosis not present

## 2020-03-08 ENCOUNTER — Ambulatory Visit (INDEPENDENT_AMBULATORY_CARE_PROVIDER_SITE_OTHER): Payer: Medicare Other | Admitting: *Deleted

## 2020-03-08 ENCOUNTER — Other Ambulatory Visit (HOSPITAL_COMMUNITY): Payer: Self-pay | Admitting: Internal Medicine

## 2020-03-08 DIAGNOSIS — Z1231 Encounter for screening mammogram for malignant neoplasm of breast: Secondary | ICD-10-CM

## 2020-03-08 DIAGNOSIS — I4891 Unspecified atrial fibrillation: Secondary | ICD-10-CM

## 2020-03-08 LAB — CUP PACEART REMOTE DEVICE CHECK
Date Time Interrogation Session: 20210613231654
Implantable Pulse Generator Implant Date: 20190212

## 2020-03-09 NOTE — Progress Notes (Signed)
Carelink Summary Report / Loop Recorder 

## 2020-03-23 DIAGNOSIS — E1165 Type 2 diabetes mellitus with hyperglycemia: Secondary | ICD-10-CM | POA: Diagnosis not present

## 2020-03-24 DIAGNOSIS — I10 Essential (primary) hypertension: Secondary | ICD-10-CM | POA: Diagnosis not present

## 2020-03-24 DIAGNOSIS — E785 Hyperlipidemia, unspecified: Secondary | ICD-10-CM | POA: Diagnosis not present

## 2020-04-06 ENCOUNTER — Inpatient Hospital Stay (HOSPITAL_COMMUNITY): Payer: Medicare Other | Attending: Hematology

## 2020-04-06 ENCOUNTER — Other Ambulatory Visit: Payer: Self-pay

## 2020-04-06 DIAGNOSIS — D696 Thrombocytopenia, unspecified: Secondary | ICD-10-CM | POA: Insufficient documentation

## 2020-04-06 DIAGNOSIS — Z7901 Long term (current) use of anticoagulants: Secondary | ICD-10-CM | POA: Insufficient documentation

## 2020-04-06 DIAGNOSIS — D509 Iron deficiency anemia, unspecified: Secondary | ICD-10-CM | POA: Insufficient documentation

## 2020-04-06 DIAGNOSIS — I4891 Unspecified atrial fibrillation: Secondary | ICD-10-CM | POA: Diagnosis not present

## 2020-04-06 DIAGNOSIS — D500 Iron deficiency anemia secondary to blood loss (chronic): Secondary | ICD-10-CM

## 2020-04-06 LAB — COMPREHENSIVE METABOLIC PANEL
ALT: 30 U/L (ref 0–44)
AST: 66 U/L — ABNORMAL HIGH (ref 15–41)
Albumin: 3.1 g/dL — ABNORMAL LOW (ref 3.5–5.0)
Alkaline Phosphatase: 72 U/L (ref 38–126)
Anion gap: 8 (ref 5–15)
BUN: 20 mg/dL (ref 8–23)
CO2: 29 mmol/L (ref 22–32)
Calcium: 9.2 mg/dL (ref 8.9–10.3)
Chloride: 103 mmol/L (ref 98–111)
Creatinine, Ser: 0.92 mg/dL (ref 0.44–1.00)
GFR calc Af Amer: >60 mL/min (ref >60)
GFR calc non Af Amer: >60 mL/min (ref >60)
Glucose, Bld: 297 mg/dL — ABNORMAL HIGH (ref 70–99)
Potassium: 4 mmol/L (ref 3.5–5.1)
Sodium: 140 mmol/L (ref 135–145)
Total Bilirubin: 1.3 mg/dL — ABNORMAL HIGH (ref 0.3–1.2)
Total Protein: 6 g/dL — ABNORMAL LOW (ref 6.5–8.1)

## 2020-04-06 LAB — CBC WITH DIFFERENTIAL/PLATELET
Abs Immature Granulocytes: 0.02 10*3/uL (ref 0.00–0.07)
Basophils Absolute: 0 10*3/uL (ref 0.0–0.1)
Basophils Relative: 0 %
Eosinophils Absolute: 0.1 10*3/uL (ref 0.0–0.5)
Eosinophils Relative: 3 %
HCT: 38 % (ref 36.0–46.0)
Hemoglobin: 12 g/dL (ref 12.0–15.0)
Immature Granulocytes: 0 %
Lymphocytes Relative: 21 %
Lymphs Abs: 0.9 10*3/uL (ref 0.7–4.0)
MCH: 33.7 pg (ref 26.0–34.0)
MCHC: 31.6 g/dL (ref 30.0–36.0)
MCV: 106.7 fL — ABNORMAL HIGH (ref 80.0–100.0)
Monocytes Absolute: 0.3 10*3/uL (ref 0.1–1.0)
Monocytes Relative: 7 %
Neutro Abs: 3 10*3/uL (ref 1.7–7.7)
Neutrophils Relative %: 69 %
Platelets: 94 10*3/uL — ABNORMAL LOW (ref 150–400)
RBC: 3.56 MIL/uL — ABNORMAL LOW (ref 3.87–5.11)
RDW: 13.3 % (ref 11.5–15.5)
WBC: 4.5 10*3/uL (ref 4.0–10.5)
nRBC: 0 % (ref 0.0–0.2)

## 2020-04-06 LAB — IRON AND TIBC
Iron: 93 ug/dL (ref 28–170)
Saturation Ratios: 30 % (ref 10.4–31.8)
TIBC: 309 ug/dL (ref 250–450)
UIBC: 216 ug/dL

## 2020-04-06 LAB — FERRITIN: Ferritin: 87 ng/mL (ref 11–307)

## 2020-04-06 LAB — FOLATE: Folate: 48 ng/mL (ref >5.9)

## 2020-04-06 LAB — VITAMIN B12: Vitamin B-12: 2585 pg/mL — ABNORMAL HIGH (ref 180–914)

## 2020-04-08 DIAGNOSIS — I10 Essential (primary) hypertension: Secondary | ICD-10-CM | POA: Diagnosis not present

## 2020-04-08 DIAGNOSIS — Z299 Encounter for prophylactic measures, unspecified: Secondary | ICD-10-CM | POA: Diagnosis not present

## 2020-04-08 DIAGNOSIS — E1165 Type 2 diabetes mellitus with hyperglycemia: Secondary | ICD-10-CM | POA: Diagnosis not present

## 2020-04-08 DIAGNOSIS — I4891 Unspecified atrial fibrillation: Secondary | ICD-10-CM | POA: Diagnosis not present

## 2020-04-08 DIAGNOSIS — J449 Chronic obstructive pulmonary disease, unspecified: Secondary | ICD-10-CM | POA: Diagnosis not present

## 2020-04-12 ENCOUNTER — Ambulatory Visit (INDEPENDENT_AMBULATORY_CARE_PROVIDER_SITE_OTHER): Payer: Medicare Other | Admitting: *Deleted

## 2020-04-12 DIAGNOSIS — I4891 Unspecified atrial fibrillation: Secondary | ICD-10-CM | POA: Diagnosis not present

## 2020-04-12 LAB — CUP PACEART REMOTE DEVICE CHECK
Date Time Interrogation Session: 20210718232302
Implantable Pulse Generator Implant Date: 20190212

## 2020-04-13 ENCOUNTER — Inpatient Hospital Stay (HOSPITAL_BASED_OUTPATIENT_CLINIC_OR_DEPARTMENT_OTHER): Payer: Medicare Other | Admitting: Hematology

## 2020-04-13 VITALS — BP 147/55 | HR 64 | Temp 97.0°F | Resp 18 | Wt 228.4 lb

## 2020-04-13 DIAGNOSIS — D500 Iron deficiency anemia secondary to blood loss (chronic): Secondary | ICD-10-CM | POA: Diagnosis not present

## 2020-04-13 DIAGNOSIS — D696 Thrombocytopenia, unspecified: Secondary | ICD-10-CM

## 2020-04-13 DIAGNOSIS — Z7901 Long term (current) use of anticoagulants: Secondary | ICD-10-CM | POA: Diagnosis not present

## 2020-04-13 DIAGNOSIS — I4891 Unspecified atrial fibrillation: Secondary | ICD-10-CM | POA: Diagnosis not present

## 2020-04-13 DIAGNOSIS — D509 Iron deficiency anemia, unspecified: Secondary | ICD-10-CM | POA: Diagnosis not present

## 2020-04-13 NOTE — Progress Notes (Signed)
[place], [place] [address]   CLINIC:  Medical Oncology/Hematology  PCP:  [name], MD [address] / [place] [address]  [phone]  REASON FOR VISIT:  Follow-up for iron deficiency anemia  CURRENT THERAPY: Feraheme  INTERVAL HISTORY:  Linda [name], a 74 y.o. female, returns for routine follow-up for her iron deficiency anemia. Sugar was last seen on 12/09/2019.  Today she complains of having diarrhea since 03/14/2020. She was subsequently put on 2 antibiotics by her PCP; Cipro 250 mg TID and another one. She was reportedly also put on glipizide and Jardiance around the same time, but she discontinued them almost immediately. She denies having any melena, hematochezia, or hematuria. She denies having any recent infections or F/C.  She quit smoking 20 years ago.   REVIEW OF SYSTEMS:  Review of Systems  Constitutional: Positive for appetite change (severely decreased) and fatigue (severe). Negative for chills and fever.  Gastrointestinal: Negative for blood in stool.  Genitourinary: Negative for hematuria.   Neurological: Positive for headaches.  All other systems reviewed and are negative.   PAST MEDICAL/SURGICAL HISTORY:  Past Medical History:  Diagnosis Date  . Allergy   . Anemia 07/25/2017  . Anxiety   . Arthritis   . Atrial fibrillation ([place])   . Cataract   . COPD (chronic obstructive pulmonary disease) ([place])   . Depression   . Diabetes mellitus without complication ([place])   . Emphysema of lung ([place])   . Hyperlipidemia   . Hypertension   . Oxygen deficiency   . Sleep apnea   . Typical atrial flutter [facility])    Past Surgical History:  Procedure Laterality Date  . A-FLUTTER ABLATION N/A 10/04/2017   Procedure: A-FLUTTER ABLATION;  Surgeon: Thompson Grayer, MD;  Location: [place] [facility];  Service: Cardiovascular;  Laterality: N/A;  . ABDOMINAL HYSTERECTOMY     fibroids  . blood clot removal from base of  brain  1990  . CARPAL TUNNEL RELEASE    . CATARACT EXTRACTION W/PHACO Left 06/16/2013   Procedure: CATARACT EXTRACTION PHACO AND INTRAOCULAR LENS PLACEMENT (IOC);  Surgeon: Tonny Branch, MD;  Location: [facility];  Service: Ophthalmology;  Laterality: Left;  CDE:  12.18  . CATARACT EXTRACTION W/PHACO Right 06/26/2013   Procedure: CATARACT EXTRACTION PHACO AND INTRAOCULAR LENS PLACEMENT (IOC);  Surgeon: Tonny Branch, MD;  Location: [facility];  Service: Ophthalmology;  Laterality: Right;  CDE:14.71  . CESAREAN SECTION    . CHOLECYSTECTOMY    . COLONOSCOPY WITH PROPOFOL N/A 04/08/2019   Dr.Fields: diverticulosis, 11 simple adenomas removed, hemorrhoids. next colonoscopy in 3 years.   Marland Kitchen DILATION AND CURETTAGE OF UTERUS    . ESOPHAGOGASTRODUODENOSCOPY (EGD) WITH PROPOFOL N/A 04/08/2019   Dr. Oneida Alar: H.pylori gastritis  . EYE SURGERY  12/2016  . FOOT NEUROMA SURGERY Right   . LOOP RECORDER INSERTION N/A 11/06/2017   Procedure: LOOP RECORDER INSERTION;  Surgeon: Thompson Grayer, MD;  Location: [place] [facility];  Service: Cardiovascular;  Laterality: N/A;  . MOUTH SURGERY    . POLYPECTOMY  04/08/2019   Procedure: POLYPECTOMY;  Surgeon: Danie Binder, MD;  [facility];  Service: Endoscopy;;  . TONSILLECTOMY      SOCIAL HISTORY:  Social History   Socioeconomic History  . Marital status: Widowed    Spouse name: Not on file  . Number of children: 1  . Years of education: 9  . Highest education level: Not on  file  Occupational History  . Occupation: retired    Comment: catering, Engineer, maintenance  Tobacco Use  . Smoking status: Former Smoker    Packs/day: 1.50    Types: Cigarettes    Start date: 09/25/1958    Quit date: 06/06/2001    Years since quitting: 18.8  . Smokeless tobacco: Never Used  Vaping Use  . Vaping Use: Never used  Substance and Sexual Activity  . Alcohol use: No  . Drug use: No  . Sexual activity: Not Currently  Other Topics Concern  . Not on file  Social History Narrative    Lives alone   Son lives in [place]   TV, reads, puzzles   Social Determinants of Health   Financial Resource Strain:   . Difficulty of Paying Living Expenses:   Food Insecurity:   . Worried About Charity fundraiser in the Last Year:   . Arboriculturist in the Last Year:   Transportation Needs:   . Film/video editor (Medical):   Marland Kitchen Lack of Transportation (Non-Medical):   Physical Activity:   . Days of Exercise per Week:   . Minutes of Exercise per Session:   Stress:   . Feeling of Stress :   Social Connections:   . Frequency of Communication with Friends and Family:   . Frequency of Social Gatherings with Friends and Family:   . Attends Religious Services:   . Active Member of Clubs or Organizations:   . Attends Archivist Meetings:   Marland Kitchen Marital Status:   Intimate Partner Violence:   . Fear of Current or Ex-Partner:   . Emotionally Abused:   Marland Kitchen Physically Abused:   . Sexually Abused:     FAMILY HISTORY:  Family History  Problem Relation Age of Onset  . Emphysema Mother   . Alcohol abuse Mother   . Arthritis Mother   . COPD Mother   . Depression Mother   . Hyperlipidemia Mother   . Hypertension Mother   . Heart attack Father   . Cancer Father        lung  . Heart disease Father   . Diabetes Brother   . COPD Brother   . Other Maternal Grandmother        swine flu 1919  . Colon cancer Neg Hx   . Colon polyps Neg Hx     CURRENT MEDICATIONS:  Current Outpatient Medications  Medication Sig Dispense Refill  . Alpha-Lipoic Acid 200 MG TABS Take 200 mg by mouth 2 (two) times daily.     Marland Kitchen apixaban (ELIQUIS) 5 MG TABS tablet Take 1 tablet (5 mg total) by mouth 2 (two) times daily. 180 tablet 3  . Ascorbic Acid (VITAMIN C) 1000 MG tablet Take 1,000 mg by mouth daily.    . calcium-vitamin D (OSCAL WITH D) 500-200 MG-UNIT tablet Take 1 tablet by mouth daily.    . cetirizine (ZYRTEC) 10 MG tablet Take 10 mg by mouth 2 (two) times daily.    .  Cholecalciferol (VITAMIN D) 50 MCG (2000 UT) CAPS Take 2,000 Units by mouth daily.     . Melatonin 10 MG TABS Take 25 mg by mouth at bedtime.     . metFORMIN (GLUCOPHAGE) 500 MG tablet Take 1,000 mg by mouth daily.     . metoprolol succinate (TOPROL-XL) 50 MG 24 hr tablet Take 50 mg by mouth 2 (two) times daily. Take with or immediately following a meal.    . Multiple  Vitamin (MULTIVITAMIN) tablet Take 1 tablet by mouth daily.    . OXYGEN Inhale 2 L into the lungs at bedtime.     . pravastatin (PRAVACHOL) 20 MG tablet Take 1 tablet (20 mg total) daily by mouth. (Patient taking differently: Take 20 mg by mouth at bedtime. ) 90 tablet 3  . Probiotic Product (PROBIOTIC DAILY PO) Take 1 capsule by mouth daily.     . vitamin B-12 (CYANOCOBALAMIN) 1000 MCG tablet Take 1,000 mcg by mouth daily.    Marland Kitchen acetaminophen (TYLENOL) 500 MG tablet Take 1,000 mg by mouth every 6 (six) hours as needed for moderate pain or headache.  (Patient not taking: Reported on 04/13/2020)    . meclizine (ANTIVERT) 25 MG tablet Take 25 mg by mouth every 8 (eight) hours as needed. (Patient not taking: Reported on 04/13/2020)     No current facility-administered medications for this visit.    ALLERGIES:  Allergies  Allergen Reactions  . Other Swelling and Other (See Comments)    Local arm swelling- TB skin Test  . Trazodone And Nefazodone Hives and Other (See Comments)    "blisters my skin"   . Nickel Other (See Comments)    "oozing"  . Tetanus Toxoids Other (See Comments)    Left a knot  . Adhesive [Tape] Itching  . Bactrim [Sulfamethoxazole-Trimethoprim] Itching and Rash  . Penicillins Itching, Rash and Other (See Comments)    Has patient had a PCN reaction causing immediate rash, facial/tongue/throat swelling, SOB or lightheadedness with hypotension: Unknown Has patient had a PCN reaction causing severe rash involving mucus membranes or skin necrosis: Unknown Has patient had a PCN reaction that required  hospitalization: No Has patient had a PCN reaction occurring within the last 10 years: No If all of the above answers are "NO", then may proceed with Cephalosporin use  . Ppd [Tuberculin Purified Protein Derivative] Swelling and Other (See Comments)    Local arm swelling  . Sulfamethoxazole-Trimethoprim Itching and Rash    PHYSICAL EXAM:  Performance status (ECOG): 1 - Symptomatic but completely ambulatory  Vitals:   04/13/20 1444  BP: (!) 147/55  Pulse: 64  Resp: 18  Temp: (!) 97 F (36.1 C)  SpO2: 95%   Wt Readings from Last 3 Encounters:  04/13/20 228 lb 6.3 oz (103.6 kg)  12/09/19 224 lb (101.6 kg)  10/14/19 232 lb (105.2 kg)   Physical Exam Vitals reviewed.  Constitutional:      Appearance: Normal appearance. She is obese.  Cardiovascular:     Rate and Rhythm: Normal rate and regular rhythm.     Pulses: Normal pulses.     Heart sounds: Normal heart sounds.  Pulmonary:     Effort: Pulmonary effort is normal.     Breath sounds: Normal breath sounds.  Musculoskeletal:     Right lower leg: No edema.     Left lower leg: No edema.  Neurological:     General: No focal deficit present.     Mental Status: She is alert and oriented to person, place, and time.  Psychiatric:        Mood and Affect: Mood normal.        Behavior: Behavior normal.     LABORATORY DATA:  I have reviewed the labs as listed.  CBC Latest Ref Rng & Units 04/06/2020 12/08/2019 09/02/2019  WBC 4.0 - 10.5 K/uL 4.5 4.7 5.1  Hemoglobin 12.0 - 15.0 g/dL 12.0 13.0 13.9  Hematocrit 36 - 46 % 38.0 42.2 44.4  Platelets 150 - 400 K/uL 94(L) 109(L) 110(L)   CMP Latest Ref Rng & Units 04/06/2020 06/03/2019 02/26/2019  Glucose 70 - 99 mg/dL 297(H) 127(H) 164(H)  BUN 8 - 23 mg/dL 20 14 17   Creatinine 0.44 - 1.00 mg/dL 0.92 0.78 0.95  Sodium 135 - 145 mmol/L 140 143 139  Potassium 3.5 - 5.1 mmol/L 4.0 3.8 3.9  Chloride 98 - 111 mmol/L 103 106 101  CO2 22 - 32 mmol/L 29 31 26   Calcium 8.9 - 10.3 mg/dL 9.2 9.6  9.5  Total Protein 6.5 - 8.1 g/dL 6.0(L) 6.3(L) 6.8  Total Bilirubin 0.3 - 1.2 mg/dL 1.3(H) 1.3(H) 1.4(H)  Alkaline Phos 38 - 126 U/L 72 94 87  AST 15 - 41 U/L 66(H) 32 28  ALT 0 - 44 U/L 30 23 20       Component Value Date/Time   RBC 3.56 (L) 04/06/2020 1206   MCV 106.7 (H) 04/06/2020 1206   MCV 91 09/24/2017 1550   MCH 33.7 04/06/2020 1206   MCHC 31.6 04/06/2020 1206   RDW 13.3 04/06/2020 1206   RDW 18.5 (H) 09/24/2017 1550   LYMPHSABS 0.9 04/06/2020 1206   LYMPHSABS 1.3 09/24/2017 1550   MONOABS 0.3 04/06/2020 1206   EOSABS 0.1 04/06/2020 1206   EOSABS 0.1 09/24/2017 1550   BASOSABS 0.0 04/06/2020 1206   BASOSABS 0.0 09/24/2017 1550   Lab Results  Component Value Date   TIBC 309 04/06/2020   TIBC 388 12/08/2019   TIBC 345 09/02/2019   FERRITIN 87 04/06/2020   FERRITIN 25 12/08/2019   FERRITIN 129 09/02/2019   IRONPCTSAT 30 04/06/2020   IRONPCTSAT 23 12/08/2019   IRONPCTSAT 26 09/02/2019     DIAGNOSTIC IMAGING:  I have independently reviewed the scans and discussed with the patient. CUP PACEART REMOTE DEVICE CHECK  Result Date: 04/12/2020 Care[facility] summary report received. Battery status OK. Normal device function. No new symptom episodes, tachy episodes, brady, or pause episodes. AF burden 8%, Vent rates 120-170 bpm majority of time while in AF . Known AF with RVR, on Eliquis and Toprol. Presenting rate 120s bpm. Monthly summary reports and ROV/PRN   [place]    ASSESSMENT:  1.  Iron deficiency anemia: -Colonoscopy on 04/08/2019 with normal ileum, moderate diverticulosis in the rectosigmoid colon, external and internal hemorrhoids.  Pathology consistent with tubular adenomas. -Last Feraheme on 12/30/2019.  2.  Thrombocytopenia: -CT scan of the chest on 01/05/2017 at [facility] showed borderline splenomegaly.  No comment on the liver.   PLAN:  1.  Iron deficiency anemia: -CBC on 04/06/2020 shows hemoglobin 12.  Ferritin is 87, up from 25 in March 2021.  K93  and folic acid were normal.  Creatinine is 0.92.  Mild elevation of AST at 66 and bilirubin of 1.3. -She is reportedly having diarrhea for the past 1 month.  She completed antibiotics with Cipro and Flagyl.  She was told to follow-up with her PMD for stool studies as diarrhea is still present. -I will see her back in 3 months for follow-up with repeat labs.  2.  Thrombocytopenia: -Platelet count today is 94.  Likely from borderline splenomegaly.  Differential diagnosis also includes early myelodysplasia.  3.  Atrial fibrillation: -Continue Eliquis twice daily.   Orders placed this encounter:  No orders of the defined types were placed in this encounter.    Derek Jack, MD [place] [phone]   I, Milinda Antis, am acting as a scribe for Dr. Sanda Linger.  Lorine Bears  Delton Coombes MD, have reviewed the above documentation for accuracy and completeness, and I agree with the above.

## 2020-04-13 NOTE — Patient Instructions (Signed)
[facility] at [facility] Discharge Instructions  You were seen today by Dr. Katragadda. He went over your recent results and scans. Dr. Katragadda will see you back in 3 months for labs and follow up.   Thank you for choosing [facility] at [facility] to provide your oncology and hematology care.  To afford each patient quality time with our provider, please arrive at least 15 minutes before your scheduled appointment time.   If you have a lab appointment with the Cancer Center please come in thru the Main Entrance and check in at the main information desk  You need to re-schedule your appointment should you arrive 10 or more minutes late.  We strive to give you quality time with our providers, and arriving late affects you and other patients whose appointments are after yours.  Also, if you no show three or more times for appointments you may be dismissed from the clinic at the providers discretion.     Again, thank you for choosing [facility].  Our hope is that these requests will decrease the amount of time that you wait before being seen by our physicians.       _____________________________________________________________  Should you have questions after your visit to [facility], please contact our office at [phone] between the hours of 8:00 a.m. and 4:30 p.m.  Voicemails left after 4:00 p.m. will not be returned until the following business day.  For prescription refill requests, have your pharmacy contact our office and allow 72 hours.    Cancer Center Support Programs:   > Cancer Support Group  2nd Tuesday of the month 1pm-2pm, [facility]    

## 2020-04-14 NOTE — Progress Notes (Signed)
Carelink Summary Report / Loop Recorder 

## 2020-04-15 ENCOUNTER — Other Ambulatory Visit: Payer: Self-pay

## 2020-04-15 ENCOUNTER — Ambulatory Visit (HOSPITAL_COMMUNITY)
Admission: RE | Admit: 2020-04-15 | Discharge: 2020-04-15 | Disposition: A | Discharge status: Home / Self Care | Payer: Medicare Other | Source: Ambulatory Visit | Attending: Internal Medicine | Admitting: Internal Medicine

## 2020-04-15 DIAGNOSIS — Z1231 Encounter for screening mammogram for malignant neoplasm of breast: Secondary | ICD-10-CM | POA: Insufficient documentation

## 2020-04-20 DIAGNOSIS — H35372 Puckering of macula, left eye: Secondary | ICD-10-CM | POA: Diagnosis not present

## 2020-04-23 DIAGNOSIS — F329 Major depressive disorder, single episode, unspecified: Secondary | ICD-10-CM | POA: Diagnosis not present

## 2020-04-23 DIAGNOSIS — E1165 Type 2 diabetes mellitus with hyperglycemia: Secondary | ICD-10-CM | POA: Diagnosis not present

## 2020-04-23 DIAGNOSIS — I10 Essential (primary) hypertension: Secondary | ICD-10-CM | POA: Diagnosis not present

## 2020-04-23 DIAGNOSIS — E7849 Other hyperlipidemia: Secondary | ICD-10-CM | POA: Diagnosis not present

## 2020-04-23 DIAGNOSIS — E119 Type 2 diabetes mellitus without complications: Secondary | ICD-10-CM | POA: Diagnosis not present

## 2020-05-16 LAB — CUP PACEART REMOTE DEVICE CHECK
Date Time Interrogation Session: 20210819001406
Implantable Pulse Generator Implant Date: 20190212

## 2020-05-17 ENCOUNTER — Ambulatory Visit (INDEPENDENT_AMBULATORY_CARE_PROVIDER_SITE_OTHER): Payer: Medicare Other | Admitting: *Deleted

## 2020-05-17 DIAGNOSIS — I4891 Unspecified atrial fibrillation: Secondary | ICD-10-CM | POA: Diagnosis not present

## 2020-05-20 NOTE — Progress Notes (Signed)
Carelink Summary Report / Loop Recorder 

## 2020-05-25 DIAGNOSIS — E1165 Type 2 diabetes mellitus with hyperglycemia: Secondary | ICD-10-CM | POA: Diagnosis not present

## 2020-06-21 ENCOUNTER — Ambulatory Visit (INDEPENDENT_AMBULATORY_CARE_PROVIDER_SITE_OTHER): Payer: Medicare Other | Admitting: Emergency Medicine

## 2020-06-21 DIAGNOSIS — I4891 Unspecified atrial fibrillation: Secondary | ICD-10-CM

## 2020-06-21 LAB — CUP PACEART REMOTE DEVICE CHECK
Date Time Interrogation Session: 20210919003051
Implantable Pulse Generator Implant Date: 20190212

## 2020-06-23 NOTE — Progress Notes (Signed)
Carelink Summary Report / Loop Recorder 

## 2020-06-24 DIAGNOSIS — E1165 Type 2 diabetes mellitus with hyperglycemia: Secondary | ICD-10-CM | POA: Diagnosis not present

## 2020-06-25 ENCOUNTER — Encounter: Payer: Self-pay | Admitting: Internal Medicine

## 2020-06-25 ENCOUNTER — Ambulatory Visit (INDEPENDENT_AMBULATORY_CARE_PROVIDER_SITE_OTHER): Payer: Medicare Other | Admitting: Internal Medicine

## 2020-06-25 VITALS — BP 134/68 | HR 67 | Ht 63.0 in | Wt 222.4 lb

## 2020-06-25 DIAGNOSIS — Z23 Encounter for immunization: Secondary | ICD-10-CM | POA: Diagnosis not present

## 2020-06-25 DIAGNOSIS — I4891 Unspecified atrial fibrillation: Secondary | ICD-10-CM

## 2020-06-25 DIAGNOSIS — G4733 Obstructive sleep apnea (adult) (pediatric): Secondary | ICD-10-CM

## 2020-06-25 LAB — CUP PACEART INCLINIC DEVICE CHECK
Date Time Interrogation Session: 20211001113514
Implantable Pulse Generator Implant Date: 20190212

## 2020-06-25 NOTE — Patient Instructions (Signed)
Medication Instructions:  Continue all current medications.  Labwork: none  Testing/Procedures: none  Follow-Up: 1 year   Any Other Special Instructions Will Be Listed Below (If Applicable).  If you need a refill on your cardiac medications before your next appointment, please call your pharmacy.  

## 2020-06-25 NOTE — Progress Notes (Signed)
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 female who presents today for routine electrophysiology followup.  Since last being seen in our clinic, the patient reports doing very well.  Today, she denies symptoms of palpitations, chest pain, shortness of breath,  lower extremity edema, dizziness, presyncope, or syncope.  The patient is otherwise without complaint today.   Past Medical History:  Diagnosis Date  . Allergy   . Anemia 07/25/2017  . Anxiety   . Arthritis   . Atrial fibrillation ([place])   . Cataract   . COPD (chronic obstructive pulmonary disease) ([place])   . Depression   . Diabetes mellitus without complication ([place])   . Emphysema of lung ([place])   . Hyperlipidemia   . Hypertension   . Oxygen deficiency   . Sleep apnea   . Typical atrial flutter [facility])    Past Surgical History:  Procedure Laterality Date  . A-FLUTTER ABLATION N/A 10/04/2017   Procedure: A-FLUTTER ABLATION;  Surgeon: Thompson Grayer, MD;  Location: [place] [facility];  Service: Cardiovascular;  Laterality: N/A;  . ABDOMINAL HYSTERECTOMY     fibroids  . blood clot removal from base of brain  1990  . CARPAL TUNNEL RELEASE    . CATARACT EXTRACTION W/PHACO Left 06/16/2013   Procedure: CATARACT EXTRACTION PHACO AND INTRAOCULAR LENS PLACEMENT (IOC);  Surgeon: Tonny Branch, MD;  Location: [facility];  Service: Ophthalmology;  Laterality: Left;  CDE:  12.18  . CATARACT EXTRACTION W/PHACO Right 06/26/2013   Procedure: CATARACT EXTRACTION PHACO AND INTRAOCULAR LENS PLACEMENT (IOC);  Surgeon: Tonny Branch, MD;  Location: [facility];  Service: Ophthalmology;  Laterality: Right;  CDE:14.71  . CESAREAN SECTION    . CHOLECYSTECTOMY    . COLONOSCOPY WITH PROPOFOL N/A 04/08/2019   Dr.Fields: diverticulosis, 11 simple adenomas removed, hemorrhoids. next colonoscopy in 3 years.   Marland Kitchen DILATION AND CURETTAGE OF UTERUS    . ESOPHAGOGASTRODUODENOSCOPY (EGD) WITH PROPOFOL N/A 04/08/2019   Dr. Oneida Alar: H.pylori  gastritis  . EYE SURGERY  12/2016  . FOOT NEUROMA SURGERY Right   . LOOP RECORDER INSERTION N/A 11/06/2017   Procedure: LOOP RECORDER INSERTION;  Surgeon: Thompson Grayer, MD;  Location: [place] [facility];  Service: Cardiovascular;  Laterality: N/A;  . MOUTH SURGERY    . POLYPECTOMY  04/08/2019   Procedure: POLYPECTOMY;  Surgeon: Danie Binder, MD;  [facility];  Service: Endoscopy;;  . TONSILLECTOMY      ROS- all systems are reviewed and negatives except as per HPI above  Current Outpatient Medications  Medication Sig Dispense Refill  . acetaminophen (TYLENOL) 500 MG tablet Take 1,000 mg by mouth every 6 (six) hours as needed for moderate pain or headache.     . Alpha-Lipoic Acid 200 MG TABS Take 200 mg by mouth 2 (two) times daily.     Marland Kitchen apixaban (ELIQUIS) 5 MG TABS tablet Take 1 tablet (5 mg total) by mouth 2 (two) times daily. 180 tablet 3  . Ascorbic Acid (VITAMIN C) 1000 MG tablet Take 1,000 mg by mouth daily.    . calcium-vitamin D (OSCAL WITH D) 500-200 MG-UNIT tablet Take 1 tablet by mouth daily.    . cetirizine (ZYRTEC) 10 MG tablet Take 10 mg by mouth 2 (two) times daily.    . Cholecalciferol (VITAMIN D) 50 MCG (2000 UT) CAPS Take 2,000 Units by mouth daily.     . meclizine (ANTIVERT) 25 MG tablet Take 25  mg by mouth every 8 (eight) hours as needed.     . Melatonin 10 MG TABS Take 25 mg by mouth at bedtime.     . metFORMIN (GLUCOPHAGE) 500 MG tablet Take 1,000 mg by mouth daily.     . metoprolol succinate (TOPROL-XL) 50 MG 24 hr tablet Take 50 mg by mouth 2 (two) times daily. Take with or immediately following a meal.    . Multiple Vitamin (MULTIVITAMIN) tablet Take 1 tablet by mouth daily.    . OXYGEN Inhale 2 L into the lungs at bedtime.     . pravastatin (PRAVACHOL) 20 MG tablet Take 1 tablet (20 mg total) daily by mouth. (Patient taking differently: Take 20 mg by mouth at bedtime. ) 90 tablet 3  . Probiotic Product (PROBIOTIC DAILY PO) Take 1 capsule by mouth  daily.     . vitamin B-12 (CYANOCOBALAMIN) 1000 MCG tablet Take 1,000 mcg by mouth daily.     No current facility-administered medications for this visit.    Physical Exam: Vitals:   06/25/20 1219  BP: 134/68  Pulse: 67  SpO2: 90%  Weight: 222 lb 6.4 oz (100.9 kg)  Height: 5\' 3"  (1.6 m)    GEN- The patient is well appearing, alert and oriented x 3 today.   Head- normocephalic, atraumatic Eyes-  Sclera clear, conjunctiva pink Ears- hearing intact Oropharynx- clear Lungs-   normal work of breathing Heart- Regular rate and rhythm  GI- sof  Extremities- no clubbing, cyanosis, or edema  Wt Readings from Last 3 Encounters:  06/25/20 222 lb 6.4 oz (100.9 kg)  04/13/20 228 lb 6.3 oz (103.6 kg)  12/09/19 224 lb (101.6 kg)    EKG tracing ordered today is personally reviewed and shows sinus rhythm  Assessment and Plan:  1. Paroxysmal atrial fibrillation/ atrial flutter afib burden by ILR is 2.6 % (previously 1.5-2%) Continue eliquis for chads2vasc score of 4  2. OSA Not compliant with therapy  3. Obesity Body mass index is 39.4 kg/m. Lifestyle modification advised  4. HTN Stable No change required today  5. HL Continue statin  Risks, benefits and potential toxicities for medications prescribed and/or refilled reviewed with patient today.   Return in a year  Thompson Grayer MD, [facility] 06/25/2020 12:31 PM

## 2020-07-14 ENCOUNTER — Ambulatory Visit (INDEPENDENT_AMBULATORY_CARE_PROVIDER_SITE_OTHER): Payer: Medicare Other

## 2020-07-14 ENCOUNTER — Other Ambulatory Visit: Payer: Self-pay

## 2020-07-14 ENCOUNTER — Inpatient Hospital Stay (HOSPITAL_COMMUNITY): Payer: Medicare Other | Attending: Hematology

## 2020-07-14 DIAGNOSIS — D509 Iron deficiency anemia, unspecified: Secondary | ICD-10-CM | POA: Diagnosis not present

## 2020-07-14 DIAGNOSIS — D696 Thrombocytopenia, unspecified: Secondary | ICD-10-CM | POA: Diagnosis not present

## 2020-07-14 DIAGNOSIS — D500 Iron deficiency anemia secondary to blood loss (chronic): Secondary | ICD-10-CM

## 2020-07-14 DIAGNOSIS — I4891 Unspecified atrial fibrillation: Secondary | ICD-10-CM

## 2020-07-14 DIAGNOSIS — Z7901 Long term (current) use of anticoagulants: Secondary | ICD-10-CM | POA: Diagnosis not present

## 2020-07-14 DIAGNOSIS — Z87891 Personal history of nicotine dependence: Secondary | ICD-10-CM | POA: Diagnosis not present

## 2020-07-14 LAB — CBC WITH DIFFERENTIAL/PLATELET
Abs Immature Granulocytes: 0.02 10*3/uL (ref 0.00–0.07)
Basophils Absolute: 0 10*3/uL (ref 0.0–0.1)
Basophils Relative: 1 %
Eosinophils Absolute: 0.1 10*3/uL (ref 0.0–0.5)
Eosinophils Relative: 3 %
HCT: 34.2 % — ABNORMAL LOW (ref 36.0–46.0)
Hemoglobin: 10 g/dL — ABNORMAL LOW (ref 12.0–15.0)
Immature Granulocytes: 0 %
Lymphocytes Relative: 23 %
Lymphs Abs: 1.2 10*3/uL (ref 0.7–4.0)
MCH: 29.1 pg (ref 26.0–34.0)
MCHC: 29.2 g/dL — ABNORMAL LOW (ref 30.0–36.0)
MCV: 99.4 fL (ref 80.0–100.0)
Monocytes Absolute: 0.4 10*3/uL (ref 0.1–1.0)
Monocytes Relative: 7 %
Neutro Abs: 3.4 10*3/uL (ref 1.7–7.7)
Neutrophils Relative %: 66 %
Platelets: 135 10*3/uL — ABNORMAL LOW (ref 150–400)
RBC: 3.44 MIL/uL — ABNORMAL LOW (ref 3.87–5.11)
RDW: 13.8 % (ref 11.5–15.5)
WBC: 5.1 10*3/uL (ref 4.0–10.5)
nRBC: 0 % (ref 0.0–0.2)

## 2020-07-14 LAB — FERRITIN: Ferritin: 7 ng/mL — ABNORMAL LOW (ref 11–307)

## 2020-07-14 LAB — IRON AND TIBC
Iron: 48 ug/dL (ref 28–170)
Saturation Ratios: 11 % (ref 10.4–31.8)
TIBC: 444 ug/dL (ref 250–450)
UIBC: 396 ug/dL

## 2020-07-14 LAB — CUP PACEART REMOTE DEVICE CHECK
Date Time Interrogation Session: 20211020021337
Implantable Pulse Generator Implant Date: 20190212

## 2020-07-15 DIAGNOSIS — I10 Essential (primary) hypertension: Secondary | ICD-10-CM | POA: Diagnosis not present

## 2020-07-15 DIAGNOSIS — I4891 Unspecified atrial fibrillation: Secondary | ICD-10-CM | POA: Diagnosis not present

## 2020-07-15 DIAGNOSIS — Z299 Encounter for prophylactic measures, unspecified: Secondary | ICD-10-CM | POA: Diagnosis not present

## 2020-07-15 DIAGNOSIS — E1165 Type 2 diabetes mellitus with hyperglycemia: Secondary | ICD-10-CM | POA: Diagnosis not present

## 2020-07-15 DIAGNOSIS — J449 Chronic obstructive pulmonary disease, unspecified: Secondary | ICD-10-CM | POA: Diagnosis not present

## 2020-07-20 NOTE — Progress Notes (Signed)
Carelink Summary Report / Loop Recorder 

## 2020-07-21 ENCOUNTER — Other Ambulatory Visit: Payer: Self-pay

## 2020-07-21 ENCOUNTER — Inpatient Hospital Stay (HOSPITAL_BASED_OUTPATIENT_CLINIC_OR_DEPARTMENT_OTHER): Payer: Medicare Other | Admitting: Hematology

## 2020-07-21 VITALS — BP 139/58 | HR 65 | Temp 96.9°F | Resp 20 | Wt 220.7 lb

## 2020-07-21 DIAGNOSIS — D500 Iron deficiency anemia secondary to blood loss (chronic): Secondary | ICD-10-CM | POA: Diagnosis not present

## 2020-07-21 DIAGNOSIS — I4891 Unspecified atrial fibrillation: Secondary | ICD-10-CM | POA: Diagnosis not present

## 2020-07-21 DIAGNOSIS — D696 Thrombocytopenia, unspecified: Secondary | ICD-10-CM | POA: Diagnosis not present

## 2020-07-21 DIAGNOSIS — Z7901 Long term (current) use of anticoagulants: Secondary | ICD-10-CM | POA: Diagnosis not present

## 2020-07-21 DIAGNOSIS — D509 Iron deficiency anemia, unspecified: Secondary | ICD-10-CM | POA: Diagnosis not present

## 2020-07-21 DIAGNOSIS — Z87891 Personal history of nicotine dependence: Secondary | ICD-10-CM | POA: Diagnosis not present

## 2020-07-21 NOTE — Progress Notes (Signed)
Linda [name], [name]   CLINIC:  Medical Oncology/Hematology  PCP:  [name], MD [address] / [place] [address]  [phone]  REASON FOR VISIT:  Follow-up for IDA  PRIOR THERAPY: None  CURRENT THERAPY: Intermittent Feraheme last 12/30/2019  INTERVAL HISTORY:  Linda [name], a 74 y.o. female, returns for routine follow-up for her IDA. [name] was last seen on 04/13/2020.  Today she reports feeling fatigued. She also reports having craving for ice. She reports that she feels better after the last Feraheme for 3-4 months. She denies CP or lightheadedness, though she reports having SOB with exertion. She takes Tums occasionally after taking her meds. She has constipation at baseline and reports that Miralax helps her have BM's.   REVIEW OF SYSTEMS:  Review of Systems  Constitutional: Positive for fatigue (60%). Negative for appetite change.  Respiratory: Positive for cough, shortness of breath (w/ exertion) and wheezing.   Cardiovascular: Negative for chest pain.  Gastrointestinal: Positive for constipation.  Neurological: Positive for dizziness and headaches. Negative for light-headedness.  All other systems reviewed and are negative.   PAST MEDICAL/SURGICAL HISTORY:  Past Medical History:  Diagnosis Date  . Allergy   . Anemia 07/25/2017  . Anxiety   . Arthritis   . Atrial fibrillation ([place])   . Cataract   . COPD (chronic obstructive pulmonary disease) ([place])   . Depression   . Diabetes mellitus without complication ([place])   . Emphysema of lung ([place])   . Hyperlipidemia   . Hypertension   . Oxygen deficiency   . Sleep apnea   . Typical atrial flutter [facility])    Past Surgical History:  Procedure Laterality Date  . A-FLUTTER ABLATION N/A 10/04/2017   Procedure: A-FLUTTER ABLATION;  Surgeon: Thompson Grayer, MD;  Location: [facility];  Service: Cardiovascular;  Laterality: N/A;  . ABDOMINAL HYSTERECTOMY      fibroids  . blood clot removal from base of brain  1990  . CARPAL TUNNEL RELEASE    . CATARACT EXTRACTION W/PHACO Left 06/16/2013   Procedure: CATARACT EXTRACTION PHACO AND INTRAOCULAR LENS PLACEMENT (IOC);  Surgeon: Tonny Branch, MD;  Location: [facility];  Service: Ophthalmology;  Laterality: Left;  CDE:  12.18  . CATARACT EXTRACTION W/PHACO Right 06/26/2013   Procedure: CATARACT EXTRACTION PHACO AND INTRAOCULAR LENS PLACEMENT (IOC);  Surgeon: Tonny Branch, MD;  Location: [facility];  Service: Ophthalmology;  Laterality: Right;  CDE:14.71  . CESAREAN SECTION    . CHOLECYSTECTOMY    . COLONOSCOPY WITH PROPOFOL N/A 04/08/2019   Dr.Fields: diverticulosis, 11 simple adenomas removed, hemorrhoids. next colonoscopy in 3 years.   Marland Kitchen DILATION AND CURETTAGE OF UTERUS    . ESOPHAGOGASTRODUODENOSCOPY (EGD) WITH PROPOFOL N/A 04/08/2019   Dr. Oneida Alar: H.pylori gastritis  . EYE SURGERY  12/2016  . FOOT NEUROMA SURGERY Right   . LOOP RECORDER INSERTION N/A 11/06/2017   Procedure: LOOP RECORDER INSERTION;  Surgeon: Thompson Grayer, MD;  Location: [facility];  Service: Cardiovascular;  Laterality: N/A;  . MOUTH SURGERY    . POLYPECTOMY  04/08/2019   Procedure: POLYPECTOMY;  Surgeon: Danie Binder, MD;  Location: [facility];  Service: Endoscopy;;  . TONSILLECTOMY      SOCIAL HISTORY:  Social History   Socioeconomic History  . Marital status: Widowed    Spouse name: Not on file  . Number of children: 1  . Years of education: 37  . Highest  education level: Not on file  Occupational History  . Occupation: retired    Comment: catering, Engineer, maintenance  Tobacco Use  . Smoking status: Former Smoker    Packs/day: 1.50    Types: Cigarettes    Start date: 09/25/1958    Quit date: 06/06/2001    Years since quitting: 19.1  . Smokeless tobacco: Never Used  Vaping Use  . Vaping Use: Never used  Substance and Sexual Activity  . Alcohol use: No  . Drug use: No  . Sexual activity: Not Currently  Other Topics Concern    . Not on file  Social History Narrative   Lives alone   Son lives in [place]   TV, reads, puzzles   Social Determinants of Health   Financial Resource Strain:   . Difficulty of Paying Living Expenses: Not on file  Food Insecurity:   . Worried About Charity fundraiser in the Last Year: Not on file  . Ran Out of Food in the Last Year: Not on file  Transportation Needs:   . Lack of Transportation (Medical): Not on file  . Lack of Transportation (Non-Medical): Not on file  Physical Activity:   . Days of Exercise per Week: Not on file  . Minutes of Exercise per Session: Not on file  Stress:   . Feeling of Stress : Not on file  Social Connections:   . Frequency of Communication with Friends and Family: Not on file  . Frequency of Social Gatherings with Friends and Family: Not on file  . Attends Religious Services: Not on file  . Active Member of Clubs or Organizations: Not on file  . Attends Archivist Meetings: Not on file  . Marital Status: Not on file  Intimate Partner Violence:   . Fear of Current or Ex-Partner: Not on file  . Emotionally Abused: Not on file  . Physically Abused: Not on file  . Sexually Abused: Not on file    FAMILY HISTORY:  Family History  Problem Relation Age of Onset  . Emphysema Mother   . Alcohol abuse Mother   . Arthritis Mother   . COPD Mother   . Depression Mother   . Hyperlipidemia Mother   . Hypertension Mother   . Heart attack Father   . Cancer Father        lung  . Heart disease Father   . Diabetes Brother   . COPD Brother   . Other Maternal Grandmother        swine flu 1919  . Colon cancer Neg Hx   . Colon polyps Neg Hx     CURRENT MEDICATIONS:  Current Outpatient Medications  Medication Sig Dispense Refill  . acetaminophen (TYLENOL) 500 MG tablet Take 1,000 mg by mouth every 6 (six) hours as needed for moderate pain or headache.     . Alpha-Lipoic Acid 200 MG TABS Take 200 mg by mouth 2 (two) times daily.      Marland Kitchen apixaban (ELIQUIS) 5 MG TABS tablet Take 1 tablet (5 mg total) by mouth 2 (two) times daily. 180 tablet 3  . Ascorbic Acid (VITAMIN C) 1000 MG tablet Take 1,000 mg by mouth daily.    . calcium-vitamin D (OSCAL WITH D) 500-200 MG-UNIT tablet Take 1 tablet by mouth daily.    . cetirizine (ZYRTEC) 10 MG tablet Take 10 mg by mouth 2 (two) times daily.    . Cholecalciferol (VITAMIN D) 50 MCG (2000 UT) CAPS Take 2,000 Units by mouth  daily.     . meclizine (ANTIVERT) 25 MG tablet Take 25 mg by mouth every 8 (eight) hours as needed.     . Melatonin 10 MG TABS Take 25 mg by mouth at bedtime.     . metFORMIN (GLUCOPHAGE) 500 MG tablet Take 1,000 mg by mouth daily.     . metoprolol succinate (TOPROL-XL) 50 MG 24 hr tablet Take 50 mg by mouth 2 (two) times daily. Take with or immediately following a meal.    . Multiple Vitamin (MULTIVITAMIN) tablet Take 1 tablet by mouth daily.    . OXYGEN Inhale 2 L into the lungs at bedtime.     . pravastatin (PRAVACHOL) 20 MG tablet Take 1 tablet (20 mg total) daily by mouth. (Patient taking differently: Take 20 mg by mouth at bedtime. ) 90 tablet 3  . Probiotic Product (PROBIOTIC DAILY PO) Take 1 capsule by mouth daily.     . vitamin B-12 (CYANOCOBALAMIN) 1000 MCG tablet Take 1,000 mcg by mouth daily.     No current facility-administered medications for this visit.    ALLERGIES:  Allergies  Allergen Reactions  . Other Swelling and Other (See Comments)    Local arm swelling- TB skin Test  . Trazodone And Nefazodone Hives and Other (See Comments)    "blisters my skin"   . Nickel Other (See Comments)    "oozing"  . Tetanus Toxoids Other (See Comments)    Left a knot  . Adhesive [Tape] Itching  . Bactrim [Sulfamethoxazole-Trimethoprim] Itching and Rash  . Penicillins Itching, Rash and Other (See Comments)    Has patient had a PCN reaction causing immediate rash, facial/tongue/throat swelling, SOB or lightheadedness with hypotension: Unknown Has patient  had a PCN reaction causing severe rash involving mucus membranes or skin necrosis: Unknown Has patient had a PCN reaction that required hospitalization: No Has patient had a PCN reaction occurring within the last 10 years: No If all of the above answers are "NO", then may proceed with Cephalosporin use  . Ppd [Tuberculin Purified Protein Derivative] Swelling and Other (See Comments)    Local arm swelling  . Sulfamethoxazole-Trimethoprim Itching and Rash    PHYSICAL EXAM:  Performance status (ECOG): 1 - Symptomatic but completely ambulatory  Vitals:   07/21/20 1424  BP: (!) 139/58  Pulse: 65  Resp: 20  Temp: (!) 96.9 F (36.1 C)  SpO2: 93%   Wt Readings from Last 3 Encounters:  07/21/20 220 lb 11.2 oz (100.1 kg)  06/25/20 222 lb 6.4 oz (100.9 kg)  04/13/20 228 lb 6.3 oz (103.6 kg)   Physical Exam Vitals reviewed.  Constitutional:      Appearance: Normal appearance. She is obese.  Cardiovascular:     Rate and Rhythm: Normal rate and regular rhythm.     Pulses: Normal pulses.     Heart sounds: Normal heart sounds.  Pulmonary:     Effort: Pulmonary effort is normal.     Breath sounds: Normal breath sounds.  Neurological:     General: No focal deficit present.     Mental Status: She is alert and oriented to person, place, and time.  Psychiatric:        Mood and Affect: Mood normal.        Behavior: Behavior normal.     LABORATORY DATA:  I have reviewed the labs as listed.  CBC Latest Ref Rng & Units 07/14/2020 04/06/2020 12/08/2019  WBC 4.0 - 10.5 K/uL 5.1 4.5 4.7  Hemoglobin 12.0 - 15.0  g/dL 10.0(L) 12.0 13.0  Hematocrit 36 - 46 % 34.2(L) 38.0 42.2  Platelets 150 - 400 K/uL 135(L) 94(L) 109(L)   CMP Latest Ref Rng & Units 04/06/2020 06/03/2019 02/26/2019  Glucose 70 - 99 mg/dL 297(H) 127(H) 164(H)  BUN 8 - 23 mg/dL 20 14 17   Creatinine 0.44 - 1.00 mg/dL 0.92 0.78 0.95  Sodium 135 - 145 mmol/L 140 143 139  Potassium 3.5 - 5.1 mmol/L 4.0 3.8 3.9  Chloride 98 - 111  mmol/L 103 106 101  CO2 22 - 32 mmol/L 29 31 26   Calcium 8.9 - 10.3 mg/dL 9.2 9.6 9.5  Total Protein 6.5 - 8.1 g/dL 6.0(L) 6.3(L) 6.8  Total Bilirubin 0.3 - 1.2 mg/dL 1.3(H) 1.3(H) 1.4(H)  Alkaline Phos 38 - 126 U/L 72 94 87  AST 15 - 41 U/L 66(H) 32 28  ALT 0 - 44 U/L 30 23 20       Component Value Date/Time   RBC 3.44 (L) 07/14/2020 1316   MCV 99.4 07/14/2020 1316   MCV 91 09/24/2017 1550   MCH 29.1 07/14/2020 1316   MCHC 29.2 (L) 07/14/2020 1316   RDW 13.8 07/14/2020 1316   RDW 18.5 (H) 09/24/2017 1550   LYMPHSABS 1.2 07/14/2020 1316   LYMPHSABS 1.3 09/24/2017 1550   MONOABS 0.4 07/14/2020 1316   EOSABS 0.1 07/14/2020 1316   EOSABS 0.1 09/24/2017 1550   BASOSABS 0.0 07/14/2020 1316   BASOSABS 0.0 09/24/2017 1550   Lab Results  Component Value Date   TIBC 444 07/14/2020   TIBC 309 04/06/2020   TIBC 388 12/08/2019   FERRITIN 7 (L) 07/14/2020   FERRITIN 87 04/06/2020   FERRITIN 25 12/08/2019   IRONPCTSAT 11 07/14/2020   IRONPCTSAT 30 04/06/2020   IRONPCTSAT 23 12/08/2019    DIAGNOSTIC IMAGING:  I have independently reviewed the scans and discussed with the patient. CUP PACEART INCLINIC DEVICE CHECK  Result Date: 06/25/2020 Device checked in-clinic by industry. See attached report.Lavenia Atlas, BSN, RN  CUP PACEART REMOTE DEVICE CHECK  Result Date: 07/14/2020 ILR summary report received. Battery status OK. Normal device function. No new symptom episodes, tachy episodes, brady, or pause episodes. Known PAF, on OAC, AF burden is 4.3% of the time.  The longest event was 8 hours and 50 minutes.   Monthly summary reports and ROV/PRN Kathy Breach, RN, CCDS, [employer]    ASSESSMENT:  1. Iron deficiency anemia: -Colonoscopy on 04/08/2019 with normal ileum, moderate diverticulosis in the rectosigmoid colon, external and internal hemorrhoids.  Pathology consistent with tubular adenomas. -Last Feraheme on 12/30/2019.  2. Thrombocytopenia: -CT scan of the chest  on 01/05/2017 at [facility] showed borderline splenomegaly.  No comment on the liver.   PLAN:  1. Iron deficiency anemia: -Reviewed labs from 07/14/2020.  Hemoglobin decreased to 10 from 12 and ferritin dropped to 7 from 87. -Recommend weekly Feraheme x2. -Also recommend start taking iron tablet daily. -RTC 6 months for follow-up with repeat labs.  2. Thrombocytopenia: -Platelet count today is 135.  Differential diagnosis includes early myelodysplasia versus borderline splenomegaly.  3. Atrial fibrillation: -Continue Eliquis twice daily.  Orders placed this encounter:  No orders of the defined types were placed in this encounter.    Derek Jack, MD [place] [phone]   I, Milinda Antis, am acting as a scribe for Dr. Sanda Linger.  I, Derek Jack MD, have reviewed the above documentation for accuracy and completeness, and I agree with the above.

## 2020-07-21 NOTE — Patient Instructions (Signed)
[place] at [facility] Discharge Instructions  You were seen today by Dr. Delton Coombes. He went over your recent results. You will be scheduled to receive 2 iron infusions 1 week apart. Purchase iron tablets over the counter and take 1 daily; purchase Miralax and take daily as well to prevent constipation. Dr. Delton Coombes will see you back in 6 months for labs and follow up.   Thank you for choosing [place] at [facility] to provide your oncology and hematology care.  To afford each patient quality time with our provider, please arrive at least 15 minutes before your scheduled appointment time.   If you have a lab appointment with the [facility] please come in thru the Main Entrance and check in at the main information desk  You need to re-schedule your appointment should you arrive 10 or more minutes late.  We strive to give you quality time with our providers, and arriving late affects you and other patients whose appointments are after yours.  Also, if you no show three or more times for appointments you may be dismissed from the clinic at the providers discretion.     Again, thank you for choosing [facility].  Our hope is that these requests will decrease the amount of time that you wait before being seen by our physicians.       _____________________________________________________________  Should you have questions after your visit to [facility], please contact our office at [phone] between the hours of 8:00 a.m. and 4:30 p.m.  Voicemails left after 4:00 p.m. will not be returned until the following business day.  For prescription refill requests, have your pharmacy contact our office and allow 72 hours.    Cancer Center Support Programs:   > Cancer Support Group  2nd Tuesday of the month 1pm-2pm, Journey Room

## 2020-07-23 DIAGNOSIS — E7849 Other hyperlipidemia: Secondary | ICD-10-CM | POA: Diagnosis not present

## 2020-07-23 DIAGNOSIS — E119 Type 2 diabetes mellitus without complications: Secondary | ICD-10-CM | POA: Diagnosis not present

## 2020-07-23 DIAGNOSIS — I10 Essential (primary) hypertension: Secondary | ICD-10-CM | POA: Diagnosis not present

## 2020-07-24 DIAGNOSIS — E1165 Type 2 diabetes mellitus with hyperglycemia: Secondary | ICD-10-CM | POA: Diagnosis not present

## 2020-07-30 ENCOUNTER — Other Ambulatory Visit: Payer: Self-pay

## 2020-07-30 ENCOUNTER — Encounter (HOSPITAL_COMMUNITY): Payer: Self-pay

## 2020-07-30 ENCOUNTER — Inpatient Hospital Stay (HOSPITAL_COMMUNITY): Payer: Medicare Other | Attending: Oncology

## 2020-07-30 VITALS — BP 153/75 | HR 64 | Temp 97.3°F | Resp 18

## 2020-07-30 DIAGNOSIS — D500 Iron deficiency anemia secondary to blood loss (chronic): Secondary | ICD-10-CM

## 2020-07-30 DIAGNOSIS — Z7901 Long term (current) use of anticoagulants: Secondary | ICD-10-CM | POA: Insufficient documentation

## 2020-07-30 DIAGNOSIS — Z87891 Personal history of nicotine dependence: Secondary | ICD-10-CM | POA: Diagnosis not present

## 2020-07-30 DIAGNOSIS — D696 Thrombocytopenia, unspecified: Secondary | ICD-10-CM | POA: Insufficient documentation

## 2020-07-30 DIAGNOSIS — D509 Iron deficiency anemia, unspecified: Secondary | ICD-10-CM | POA: Insufficient documentation

## 2020-07-30 DIAGNOSIS — I4891 Unspecified atrial fibrillation: Secondary | ICD-10-CM | POA: Insufficient documentation

## 2020-07-30 MED ORDER — SODIUM CHLORIDE 0.9 % IV SOLN: Freq: Once | INTRAVENOUS | Status: AC

## 2020-07-30 MED ORDER — SODIUM CHLORIDE 0.9 % IV SOLN
510.0000 mg | Freq: Once | INTRAVENOUS | Status: AC
  Administered 2020-07-30: 510 mg via INTRAVENOUS
  Filled 2020-07-30: qty 510

## 2020-07-30 MED ORDER — FAMOTIDINE 20 MG PO TABS
20.0000 mg | ORAL_TABLET | Freq: Once | ORAL | Status: AC
  Administered 2020-07-30: 20 mg via ORAL
  Filled 2020-07-30: qty 1

## 2020-07-30 MED ORDER — LORATADINE 10 MG PO TABS
10.0000 mg | ORAL_TABLET | Freq: Once | ORAL | Status: AC
  Administered 2020-07-30: 10 mg via ORAL
  Filled 2020-07-30: qty 1

## 2020-07-30 MED ORDER — ACETAMINOPHEN 325 MG PO TABS
650.0000 mg | ORAL_TABLET | Freq: Once | ORAL | Status: AC
  Administered 2020-07-30: 650 mg via ORAL
  Filled 2020-07-30: qty 2

## 2020-07-30 NOTE — Progress Notes (Signed)
Tolerated infusion w/o adverse reaction.  Alert, in no distress.  VSS.  Discharged ambulatory in stable condition.  

## 2020-08-02 DIAGNOSIS — Z0000 Encounter for general adult medical examination without abnormal findings: Secondary | ICD-10-CM | POA: Diagnosis not present

## 2020-08-02 DIAGNOSIS — Z7189 Other specified counseling: Secondary | ICD-10-CM | POA: Diagnosis not present

## 2020-08-02 DIAGNOSIS — Z79899 Other long term (current) drug therapy: Secondary | ICD-10-CM | POA: Diagnosis not present

## 2020-08-02 DIAGNOSIS — Z1331 Encounter for screening for depression: Secondary | ICD-10-CM | POA: Diagnosis not present

## 2020-08-02 DIAGNOSIS — Z299 Encounter for prophylactic measures, unspecified: Secondary | ICD-10-CM | POA: Diagnosis not present

## 2020-08-02 DIAGNOSIS — E785 Hyperlipidemia, unspecified: Secondary | ICD-10-CM | POA: Diagnosis not present

## 2020-08-02 DIAGNOSIS — Z6841 Body Mass Index (BMI) 40.0 and over, adult: Secondary | ICD-10-CM | POA: Diagnosis not present

## 2020-08-02 DIAGNOSIS — I10 Essential (primary) hypertension: Secondary | ICD-10-CM | POA: Diagnosis not present

## 2020-08-02 DIAGNOSIS — Z87891 Personal history of nicotine dependence: Secondary | ICD-10-CM | POA: Diagnosis not present

## 2020-08-02 DIAGNOSIS — D696 Thrombocytopenia, unspecified: Secondary | ICD-10-CM | POA: Diagnosis not present

## 2020-08-02 DIAGNOSIS — Z1339 Encounter for screening examination for other mental health and behavioral disorders: Secondary | ICD-10-CM | POA: Diagnosis not present

## 2020-08-02 DIAGNOSIS — E559 Vitamin D deficiency, unspecified: Secondary | ICD-10-CM | POA: Diagnosis not present

## 2020-08-06 ENCOUNTER — Inpatient Hospital Stay (HOSPITAL_COMMUNITY): Payer: Medicare Other

## 2020-08-06 ENCOUNTER — Telehealth: Payer: Self-pay

## 2020-08-06 ENCOUNTER — Other Ambulatory Visit: Payer: Self-pay

## 2020-08-06 VITALS — BP 132/78 | HR 82 | Temp 98.9°F | Resp 18

## 2020-08-06 DIAGNOSIS — D696 Thrombocytopenia, unspecified: Secondary | ICD-10-CM | POA: Diagnosis not present

## 2020-08-06 DIAGNOSIS — I4891 Unspecified atrial fibrillation: Secondary | ICD-10-CM | POA: Diagnosis not present

## 2020-08-06 DIAGNOSIS — Z87891 Personal history of nicotine dependence: Secondary | ICD-10-CM | POA: Diagnosis not present

## 2020-08-06 DIAGNOSIS — Z7901 Long term (current) use of anticoagulants: Secondary | ICD-10-CM | POA: Diagnosis not present

## 2020-08-06 DIAGNOSIS — D500 Iron deficiency anemia secondary to blood loss (chronic): Secondary | ICD-10-CM

## 2020-08-06 DIAGNOSIS — D509 Iron deficiency anemia, unspecified: Secondary | ICD-10-CM | POA: Diagnosis not present

## 2020-08-06 MED ORDER — SODIUM CHLORIDE 0.9 % IV SOLN
510.0000 mg | Freq: Once | INTRAVENOUS | Status: AC
  Administered 2020-08-06: 510 mg via INTRAVENOUS
  Filled 2020-08-06: qty 17

## 2020-08-06 MED ORDER — ACETAMINOPHEN 325 MG PO TABS
ORAL_TABLET | ORAL | Status: AC
  Filled 2020-08-06: qty 2

## 2020-08-06 MED ORDER — LORATADINE 10 MG PO TABS
ORAL_TABLET | ORAL | Status: AC
  Filled 2020-08-06: qty 1

## 2020-08-06 MED ORDER — ACETAMINOPHEN 325 MG PO TABS
650.0000 mg | ORAL_TABLET | Freq: Once | ORAL | Status: AC
  Administered 2020-08-06: 650 mg via ORAL

## 2020-08-06 MED ORDER — FAMOTIDINE 20 MG PO TABS
20.0000 mg | ORAL_TABLET | Freq: Once | ORAL | Status: AC
  Administered 2020-08-06: 20 mg via ORAL

## 2020-08-06 MED ORDER — SODIUM CHLORIDE 0.9 % IV SOLN: Freq: Once | INTRAVENOUS | Status: AC

## 2020-08-06 MED ORDER — LORATADINE 10 MG PO TABS
10.0000 mg | ORAL_TABLET | Freq: Once | ORAL | Status: AC
  Administered 2020-08-06: 10 mg via ORAL

## 2020-08-06 NOTE — Telephone Encounter (Signed)
Call transferred to Amy.

## 2020-08-06 NOTE — Progress Notes (Signed)
Tolerated infusion w/o adverse effects.  Alert, in no distress.  VSS.  Discharged ambulatory in stable condition.

## 2020-08-06 NOTE — Telephone Encounter (Signed)
Pt called back upon returning home.  Assisted with manual transmission.    New transmission shows pt still in AF.  AF burden rising to 5.5%, pt history shows PAF with episodes usually less than 24 hours.  Current episode now greater than 28 hours and ongoing.  V rates mostly controlled.    Pt reports she feels fine today, denies any new cardiac symptoms.  She has had a long term issue with dizziness but it is not new or worse than usual.  Advised pt I would forward info to Dr. Rayann Heman for review/ recommendations.  Reviewed ED precations with pt.

## 2020-08-06 NOTE — Telephone Encounter (Signed)
Alert AF burden, in progress w/ overall controlled rates. Known PAF on [place]. Routing to Triage for ongoing AF.  AF episode started at 0907 11/11, ongoing as of midnight 11/12, overall rates controlled.  Pt has known history of PAF, meds include Eliquis for OAC, Metoprolol 50mg  BID.    Spoke with pt, she felt pretty bad most of yesterday, spent the day in bed.  Today she feels good, currently at infusion clinic receiving iron infusion.    Pt will send manual transmission when she gets home to determine if AF episode resolved.

## 2020-08-07 NOTE — Telephone Encounter (Signed)
Please check presenting rhythm on Monday to see if afib has terminated.

## 2020-08-08 DIAGNOSIS — Z23 Encounter for immunization: Secondary | ICD-10-CM | POA: Diagnosis not present

## 2020-08-09 NOTE — Telephone Encounter (Signed)
Spoke with pt, assisted with manual transmission.  Previously noted AF did self terminate.  Episode lasted 38 hours total.    Presenting rhythm still irregular, however p waves present.    Pt reports she still feels very fatigued, unclear if this is associated with HR or anemia that she is being rteated for.  She had an iron infusion on Friday and is taking iron supplements every other day currently.

## 2020-08-11 ENCOUNTER — Telehealth: Payer: Self-pay

## 2020-08-11 ENCOUNTER — Other Ambulatory Visit: Payer: Self-pay

## 2020-08-11 MED ORDER — METOPROLOL SUCCINATE ER 50 MG PO TB24: ORAL_TABLET | ORAL | 4 refills | Status: DC

## 2020-08-11 NOTE — Telephone Encounter (Signed)
Called and spoke with patient, she is aware of appt 08/16/20 at 2 pm with Adline Peals, PA.

## 2020-08-11 NOTE — Telephone Encounter (Signed)
Manual transmission received 08/11/20.     Per Dr. Rayann Heman he would like to patient to increase Toprol- XL to 100 mg in the AM and 50 mg in the PM. Also would like for patient to be seen in AF clinic this week. Patient call and advised. Agreeable to plan.

## 2020-08-11 NOTE — Telephone Encounter (Signed)
ILR alert received 08/11/20 for AF   Patient reports yesterday she experienced dizziness most of the day. Reports she feels better today. Denies any shortness of breath, palpitations or chest pain. Compliant with medications.    Will review with Dr. Rayann Heman for recommendations.

## 2020-08-14 LAB — CUP PACEART REMOTE DEVICE CHECK
Date Time Interrogation Session: 20211120013221
Implantable Pulse Generator Implant Date: 20190212

## 2020-08-16 ENCOUNTER — Other Ambulatory Visit: Payer: Self-pay

## 2020-08-16 ENCOUNTER — Ambulatory Visit (HOSPITAL_COMMUNITY)
Admission: RE | Admit: 2020-08-16 | Discharge: 2020-08-16 | Disposition: A | Discharge status: Home / Self Care | Payer: Medicare Other | Source: Ambulatory Visit | Attending: Physician Assistant | Admitting: Physician Assistant

## 2020-08-16 ENCOUNTER — Ambulatory Visit (INDEPENDENT_AMBULATORY_CARE_PROVIDER_SITE_OTHER): Payer: Medicare Other

## 2020-08-16 ENCOUNTER — Encounter (HOSPITAL_COMMUNITY): Payer: Self-pay | Admitting: Physician Assistant

## 2020-08-16 VITALS — BP 128/68 | HR 65 | Ht 63.0 in | Wt 221.0 lb

## 2020-08-16 DIAGNOSIS — I4891 Unspecified atrial fibrillation: Secondary | ICD-10-CM

## 2020-08-16 DIAGNOSIS — Z9119 Patient's noncompliance with other medical treatment and regimen: Secondary | ICD-10-CM | POA: Diagnosis not present

## 2020-08-16 DIAGNOSIS — E119 Type 2 diabetes mellitus without complications: Secondary | ICD-10-CM | POA: Diagnosis not present

## 2020-08-16 DIAGNOSIS — Z880 Allergy status to penicillin: Secondary | ICD-10-CM | POA: Insufficient documentation

## 2020-08-16 DIAGNOSIS — Z9981 Dependence on supplemental oxygen: Secondary | ICD-10-CM | POA: Diagnosis not present

## 2020-08-16 DIAGNOSIS — E785 Hyperlipidemia, unspecified: Secondary | ICD-10-CM | POA: Diagnosis not present

## 2020-08-16 DIAGNOSIS — Z888 Allergy status to other drugs, medicaments and biological substances status: Secondary | ICD-10-CM | POA: Diagnosis not present

## 2020-08-16 DIAGNOSIS — J449 Chronic obstructive pulmonary disease, unspecified: Secondary | ICD-10-CM | POA: Insufficient documentation

## 2020-08-16 DIAGNOSIS — Z6839 Body mass index (BMI) 39.0-39.9, adult: Secondary | ICD-10-CM | POA: Insufficient documentation

## 2020-08-16 DIAGNOSIS — Z7984 Long term (current) use of oral hypoglycemic drugs: Secondary | ICD-10-CM | POA: Diagnosis not present

## 2020-08-16 DIAGNOSIS — Z7901 Long term (current) use of anticoagulants: Secondary | ICD-10-CM | POA: Diagnosis not present

## 2020-08-16 DIAGNOSIS — I10 Essential (primary) hypertension: Secondary | ICD-10-CM | POA: Diagnosis not present

## 2020-08-16 DIAGNOSIS — D6869 Other thrombophilia: Secondary | ICD-10-CM | POA: Insufficient documentation

## 2020-08-16 DIAGNOSIS — I480 Paroxysmal atrial fibrillation: Secondary | ICD-10-CM | POA: Insufficient documentation

## 2020-08-16 DIAGNOSIS — Z882 Allergy status to sulfonamides status: Secondary | ICD-10-CM | POA: Insufficient documentation

## 2020-08-16 DIAGNOSIS — Z79899 Other long term (current) drug therapy: Secondary | ICD-10-CM | POA: Diagnosis not present

## 2020-08-16 DIAGNOSIS — E669 Obesity, unspecified: Secondary | ICD-10-CM | POA: Insufficient documentation

## 2020-08-16 DIAGNOSIS — G4733 Obstructive sleep apnea (adult) (pediatric): Secondary | ICD-10-CM | POA: Diagnosis not present

## 2020-08-16 DIAGNOSIS — Z87891 Personal history of nicotine dependence: Secondary | ICD-10-CM | POA: Diagnosis not present

## 2020-08-16 NOTE — Progress Notes (Signed)
Primary Care Physician: [name], MD Primary Cardiologist: Dr Bronson Ing (previously) Primary Electrophysiologist: Dr Rayann Heman Referring Physician: Dr Kennieth Francois is a 74 y.o. female with a history of paroxysmal atrial fibrillation, atrial flutter s/p ablation, OSA, HTN, HLD, COPD, DM who presents for follow up in the [facility]. The patient was initially diagnosed with atrial fibrillation on ILR after her atrial flutter ablation 2019. Patient is on Eliquis for a CHADS2VASC score of 4. The device clinic received an alert for increasing afib burden. Her BB was increased on 08/11/20. Since then, her afib burden has decreased. She is in SR today. She is unsure if she is symptomatic with her afib but she has noticed increased dyspnea with exertion intermittently. She denies any bleeding problems with anticoagulation.   Today, she denies symptoms of palpitations, chest pain, orthopnea, PND, lower extremity edema, presyncope, syncope, bleeding, or neurologic sequela. The patient is tolerating medications without difficulties and is otherwise without complaint today.    Atrial Fibrillation Risk Factors:  she does have symptoms or diagnosis of sleep apnea. she is not compliant with CPAP therapy. she does not have a history of rheumatic fever. she does not have a history of alcohol use.  she has a BMI of Body mass index is 39.15 kg/m.Marland Kitchen Filed Weights   08/16/20 1347  Weight: 100.2 kg    Family History  Problem Relation Age of Onset  . Emphysema Mother   . Alcohol abuse Mother   . Arthritis Mother   . COPD Mother   . Depression Mother   . Hyperlipidemia Mother   . Hypertension Mother   . Heart attack Father   . Cancer Father        lung  . Heart disease Father   . Diabetes Brother   . COPD Brother   . Other Maternal Grandmother        swine flu 1919  . Colon cancer Neg Hx   . Colon polyps Neg Hx      Atrial Fibrillation  Management history:  Previous antiarrhythmic drugs: none Previous cardioversions: none Previous ablations: none CHADS2VASC score: 4 Anticoagulation history: Eliquis   Past Medical History:  Diagnosis Date  . Allergy   . Anemia 07/25/2017  . Anxiety   . Arthritis   . Atrial fibrillation ([place])   . Cataract   . COPD (chronic obstructive pulmonary disease) ([place])   . Depression   . Diabetes mellitus without complication ([place])   . Emphysema of lung ([place])   . Hyperlipidemia   . Hypertension   . Oxygen deficiency   . Sleep apnea   . Typical atrial flutter [facility])    Past Surgical History:  Procedure Laterality Date  . A-FLUTTER ABLATION N/A 10/04/2017   Procedure: A-FLUTTER ABLATION;  Surgeon: Thompson Grayer, MD;  Location: [place] [facility];  Service: Cardiovascular;  Laterality: N/A;  . ABDOMINAL HYSTERECTOMY     fibroids  . blood clot removal from base of brain  1990  . CARPAL TUNNEL RELEASE    . CATARACT EXTRACTION W/PHACO Left 06/16/2013   Procedure: CATARACT EXTRACTION PHACO AND INTRAOCULAR LENS PLACEMENT (IOC);  Surgeon: Tonny Branch, MD;  Location: [facility];  Service: Ophthalmology;  Laterality: Left;  CDE:  12.18  . CATARACT EXTRACTION W/PHACO Right 06/26/2013   Procedure: CATARACT EXTRACTION PHACO AND INTRAOCULAR LENS PLACEMENT (IOC);  Surgeon: Tonny Branch, MD;  Location: [facility];  Service: Ophthalmology;  Laterality: Right;  CDE:14.71  .  CESAREAN SECTION    . CHOLECYSTECTOMY    . COLONOSCOPY WITH PROPOFOL N/A 04/08/2019   Dr.Fields: diverticulosis, 11 simple adenomas removed, hemorrhoids. next colonoscopy in 3 years.   Marland Kitchen DILATION AND CURETTAGE OF UTERUS    . ESOPHAGOGASTRODUODENOSCOPY (EGD) WITH PROPOFOL N/A 04/08/2019   Dr. Oneida Alar: H.pylori gastritis  . EYE SURGERY  12/2016  . FOOT NEUROMA SURGERY Right   . LOOP RECORDER INSERTION N/A 11/06/2017   Procedure: LOOP RECORDER INSERTION;  Surgeon: Thompson Grayer, MD;  Location: [place] [facility];  Service: Cardiovascular;   Laterality: N/A;  . MOUTH SURGERY    . POLYPECTOMY  04/08/2019   Procedure: POLYPECTOMY;  Surgeon: Danie Binder, MD;  [facility];  Service: Endoscopy;;  . TONSILLECTOMY      Current Outpatient Medications  Medication Sig Dispense Refill  . acetaminophen (TYLENOL) 500 MG tablet Take 1,000 mg by mouth every 6 (six) hours as needed for moderate pain or headache.     . Alpha-Lipoic Acid 200 MG TABS Take 200 mg by mouth 2 (two) times daily.     Marland Kitchen apixaban (ELIQUIS) 5 MG TABS tablet Take 1 tablet (5 mg total) by mouth 2 (two) times daily. 180 tablet 3  . Ascorbic Acid (VITAMIN C) 1000 MG tablet Take 1,000 mg by mouth daily.    . calcium-vitamin D (OSCAL WITH D) 500-200 MG-UNIT tablet Take 1 tablet by mouth daily.    . cetirizine (ZYRTEC) 10 MG tablet Take 10 mg by mouth 2 (two) times daily.    . Cholecalciferol (VITAMIN D) 50 MCG (2000 UT) CAPS Take 2,000 Units by mouth daily.     . ferrous sulfate 325 (65 FE) MG EC tablet Take 325 mg by mouth every other day.    . meclizine (ANTIVERT) 25 MG tablet Take 25 mg by mouth every 8 (eight) hours as needed.     . Melatonin 10 MG TABS Take 25 mg by mouth at bedtime.     . metFORMIN (GLUCOPHAGE) 500 MG tablet Take 1,000 mg by mouth daily.     . metoprolol succinate (TOPROL-XL) 50 MG 24 hr tablet Take 100 mg in the morning and 50 mg in the evening. Take with or immediately following a meal. 252 tablet 4  . Multiple Vitamin (MULTIVITAMIN) tablet Take 1 tablet by mouth daily.    . OXYGEN Inhale 2 L into the lungs at bedtime.     . pravastatin (PRAVACHOL) 20 MG tablet Take 1 tablet (20 mg total) daily by mouth. 90 tablet 3  . Probiotic Product (PROBIOTIC DAILY PO) Take 1 capsule by mouth daily.     . vitamin B-12 (CYANOCOBALAMIN) 1000 MCG tablet Take 1,000 mcg by mouth daily.     No current facility-administered medications for this encounter.    Allergies  Allergen Reactions  . Other Swelling and Other (See Comments)    Local arm  swelling- TB skin Test  . Trazodone And Nefazodone Hives and Other (See Comments)    "blisters my skin"   . Nickel Other (See Comments)    "oozing"  . Tetanus Toxoids Other (See Comments)    Left a knot  . Adhesive [Tape] Itching  . Bactrim [Sulfamethoxazole-Trimethoprim] Itching and Rash  . Penicillins Itching, Rash and Other (See Comments)    Has patient had a PCN reaction causing immediate rash, facial/tongue/throat swelling, SOB or lightheadedness with hypotension: Unknown Has patient had a PCN reaction causing severe rash involving mucus membranes or skin necrosis: Unknown Has patient  had a PCN reaction that required hospitalization: No Has patient had a PCN reaction occurring within the last 10 years: No If all of the above answers are "NO", then may proceed with Cephalosporin use  . Ppd [Tuberculin Purified Protein Derivative] Swelling and Other (See Comments)    Local arm swelling  . Sulfamethoxazole-Trimethoprim Itching and Rash    Social History   Socioeconomic History  . Marital status: Widowed    Spouse name: Not on file  . Number of children: 1  . Years of education: 18  . Highest education level: Not on file  Occupational History  . Occupation: retired    Comment: catering, Engineer, maintenance  Tobacco Use  . Smoking status: Former Smoker    Packs/day: 1.50    Types: Cigarettes    Start date: 09/25/1958    Quit date: 06/06/2001    Years since quitting: 19.2  . Smokeless tobacco: Never Used  Vaping Use  . Vaping Use: Never used  Substance and Sexual Activity  . Alcohol use: No  . Drug use: No  . Sexual activity: Not Currently  Other Topics Concern  . Not on file  Social History Narrative   Lives alone   Son lives in [place]   TV, reads, puzzles   Social Determinants of Health   Financial Resource Strain:   . Difficulty of Paying Living Expenses: Not on file  Food Insecurity:   . Worried About Charity fundraiser in the Last Year: Not on file  . Ran Out  of Food in the Last Year: Not on file  Transportation Needs:   . Lack of Transportation (Medical): Not on file  . Lack of Transportation (Non-Medical): Not on file  Physical Activity:   . Days of Exercise per Week: Not on file  . Minutes of Exercise per Session: Not on file  Stress:   . Feeling of Stress : Not on file  Social Connections:   . Frequency of Communication with Friends and Family: Not on file  . Frequency of Social Gatherings with Friends and Family: Not on file  . Attends Religious Services: Not on file  . Active Member of Clubs or Organizations: Not on file  . Attends Archivist Meetings: Not on file  . Marital Status: Not on file  Intimate Partner Violence:   . Fear of Current or Ex-Partner: Not on file  . Emotionally Abused: Not on file  . Physically Abused: Not on file  . Sexually Abused: Not on file     ROS- All systems are reviewed and negative except as per the HPI above.  Physical Exam: Vitals:   08/16/20 1347  BP: 128/68  Pulse: 65  Weight: 100.2 kg  Height: 5\' 3"  (1.6 m)    GEN- The patient is well appearing obese female, alert and oriented x 3 today.   Head- normocephalic, atraumatic Eyes-  Sclera clear, conjunctiva pink Ears- hearing intact Oropharynx- clear Neck- supple  Lungs- Clear to ausculation bilaterally, normal work of breathing Heart- Regular rate and rhythm, no murmurs, rubs or gallops  GI- soft, NT, ND, + BS Extremities- no clubbing, cyanosis, or edema MS- no significant deformity or atrophy Skin- no rash or lesion Psych- euthymic mood, full affect Neuro- strength and sensation are intact  Wt Readings from Last 3 Encounters:  08/16/20 100.2 kg  07/21/20 100.1 kg  06/25/20 100.9 kg    EKG today demonstrates SR HR 65, PR 148, QRS 80, QTc 430  Echo 07/22/18 demonstrated  -  Left ventricle: The cavity size was normal. Wall thickness was  increased in a pattern of mild LVH. Systolic function was normal.  The  estimated ejection fraction was in the range of 60% to 65%.  Left ventricular diastolic function parameters were normal.  - Aortic valve: Valve area (VTI): 2.98 cm^2. Valve area (Vmax):  2.86 cm^2.  - Atrial septum: No defect or patent foramen ovale was identified.  - Technically difficult study.   Epic records are reviewed at length today  CHA2DS2-VASc Score = 4  The patient's score is based upon: CHF History: 0 HTN History: 1 Diabetes History: 1 Stroke History: 0 Vascular Disease History: 0 Age Score: 1 Gender Score: 1      ASSESSMENT AND PLAN: 1. Paroxysmal Atrial Fibrillation/atrial flutter The patient's CHA2DS2-VASc score is 4, indicating a 4.8% annual risk of stroke.   ILR shows increased AF burden, decreased over the last several days. We discussed therapeutic options today including flecainide and dofetilide. Avoiding amiodarone with chronic lung issues. Since patient is feeling better on higher dose of BB, will hold on making changes for now. Patient agreeable to starting AAD if AF burden remains elevated.  Continue Eliquis 5 mg BID Continue Toprol 100 mg AM and 50 mg PM  2. Secondary Hypercoagulable State (ICD10:  D68.69) The patient is at significant risk for stroke/thromboembolism based upon her CHA2DS2-VASc Score of 4.  Continue Apixaban (Eliquis).   3. Obesity Body mass index is 39.15 kg/m. Lifestyle modification was discussed at length including regular exercise and weight reduction.  4. Obstructive sleep apnea Diagnosed in 2018 The importance of adequate treatment of sleep apnea was discussed today in order to improve our ability to maintain sinus rhythm long term. Patient is not compliant with CPAP therapy. Previously referred to Dr Maxwell Caul by Dr Rayann Heman but patient did not comply. Patient motivated to try CPAP again. Will refer again to Dr Maxwell Caul.   5. HTN Stable, no changes today.   Follow up in 3-4 weeks with Dr Rayann Heman in [place]. Patient prefers  not to come to [facility] for visits if possible.    [facility] [address] [place],  [address] [phone] 08/16/2020 2:11 PM

## 2020-08-17 NOTE — Progress Notes (Signed)
Carelink Summary Report / Loop Recorder 

## 2020-08-23 ENCOUNTER — Telehealth: Payer: Self-pay | Admitting: Student

## 2020-08-23 NOTE — Telephone Encounter (Signed)
Carelink alert for episode but with no episodes. Called patient to send manual transmission.      Pt denies symptoms of SOB, dizziness, lightheadedness, chest pain, or tachy palpitations. No syncope or near syncope.   Will review transmission when received. Pt has known PAF, and upcoming appointment. Will only call if changes need to be made.  Otherwise, pt knows to call with symptoms and to keep appointment with Dr. Rayann Heman to discuss possible AAD if AF burden remains increased.   Legrand Como [address]" [place], PA-C  08/23/2020 9:45 AM

## 2020-08-23 NOTE — Telephone Encounter (Signed)
  No new episodes.  AF burden 63.8%. Upcoming appointment with Dr. Rayann Heman to discuss AAD. Presenting appears regular with ectopy, though p waves difficult to appreciate for the most part.   Known PAF on Eliquis.   Legrand Como [address] [place], PA-C  08/23/2020 10:12 AM

## 2020-08-24 DIAGNOSIS — I10 Essential (primary) hypertension: Secondary | ICD-10-CM | POA: Diagnosis not present

## 2020-08-24 DIAGNOSIS — E7849 Other hyperlipidemia: Secondary | ICD-10-CM | POA: Diagnosis not present

## 2020-08-24 DIAGNOSIS — E1165 Type 2 diabetes mellitus with hyperglycemia: Secondary | ICD-10-CM | POA: Diagnosis not present

## 2020-08-24 DIAGNOSIS — E119 Type 2 diabetes mellitus without complications: Secondary | ICD-10-CM | POA: Diagnosis not present

## 2020-09-10 ENCOUNTER — Encounter: Payer: Self-pay | Admitting: Internal Medicine

## 2020-09-10 ENCOUNTER — Ambulatory Visit (INDEPENDENT_AMBULATORY_CARE_PROVIDER_SITE_OTHER): Payer: Medicare Other | Admitting: Internal Medicine

## 2020-09-10 VITALS — BP 146/90 | HR 65 | Resp 16 | Ht 63.0 in | Wt 218.2 lb

## 2020-09-10 DIAGNOSIS — I480 Paroxysmal atrial fibrillation: Secondary | ICD-10-CM | POA: Diagnosis not present

## 2020-09-10 DIAGNOSIS — I10 Essential (primary) hypertension: Secondary | ICD-10-CM

## 2020-09-10 DIAGNOSIS — G4733 Obstructive sleep apnea (adult) (pediatric): Secondary | ICD-10-CM | POA: Diagnosis not present

## 2020-09-10 MED ORDER — FLECAINIDE ACETATE 100 MG PO TABS: ORAL_TABLET | ORAL | 1 refills | Status: DC

## 2020-09-10 NOTE — Progress Notes (Signed)
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 female who presents today for routine electrophysiology followup.  Since last being seen in our clinic, the patient reports doing very well.  Her afib has increased.  With this, she has developed fatigue, decreased exercise tolerance and edema. Today, she denies symptoms of palpitations, chest pain, shortness of breath, dizziness, presyncope, or syncope.  The patient is otherwise without complaint today.   Past Medical History:  Diagnosis Date   Allergy    Anemia 07/25/2017   Anxiety    Arthritis    Atrial fibrillation ([facility])    Cataract    COPD (chronic obstructive pulmonary disease) ([place])    Depression    Diabetes mellitus without complication ([place])    Emphysema of lung ([place])    Hyperlipidemia    Hypertension    Oxygen deficiency    Sleep apnea    Typical atrial flutter ([place])    Past Surgical History:  Procedure Laterality Date   A-FLUTTER ABLATION N/A 10/04/2017   Procedure: A-FLUTTER ABLATION;  Surgeon: Thompson Grayer, MD;  Location: [place] [facility];  Service: Cardiovascular;  Laterality: N/A;   ABDOMINAL HYSTERECTOMY     fibroids   blood clot removal from base of brain  1990   CARPAL TUNNEL RELEASE     CATARACT EXTRACTION W/PHACO Left 06/16/2013   Procedure: CATARACT EXTRACTION PHACO AND INTRAOCULAR LENS PLACEMENT (IOC);  Surgeon: Tonny Branch, MD;  Location: [facility];  Service: Ophthalmology;  Laterality: Left;  CDE:  12.18   CATARACT EXTRACTION W/PHACO Right 06/26/2013   Procedure: CATARACT EXTRACTION PHACO AND INTRAOCULAR LENS PLACEMENT (IOC);  Surgeon: Tonny Branch, MD;  Location: [facility];  Service: Ophthalmology;  Laterality: Right;  CDE:14.71   CESAREAN SECTION     CHOLECYSTECTOMY     COLONOSCOPY WITH PROPOFOL N/A 04/08/2019   Dr.Fields: diverticulosis, 11 simple adenomas removed, hemorrhoids. next colonoscopy in 3 years.    DILATION AND CURETTAGE OF UTERUS      ESOPHAGOGASTRODUODENOSCOPY (EGD) WITH PROPOFOL N/A 04/08/2019   Dr. Oneida Alar: H.pylori gastritis   EYE SURGERY  12/2016   FOOT NEUROMA SURGERY Right    LOOP RECORDER INSERTION N/A 11/06/2017   Procedure: LOOP RECORDER INSERTION;  Surgeon: Thompson Grayer, MD;  Location: [place] [facility];  Service: Cardiovascular;  Laterality: N/A;   MOUTH SURGERY     POLYPECTOMY  04/08/2019   Procedure: POLYPECTOMY;  Surgeon: Danie Binder, MD;  [facility];  Service: Endoscopy;;   TONSILLECTOMY      ROS- all systems are reviewed and negatives except as per HPI above  Current Outpatient Medications  Medication Sig Dispense Refill   acetaminophen (TYLENOL) 500 MG tablet Take 1,000 mg by mouth every 6 (six) hours as needed for moderate pain or headache.      ADVAIR DISKUS 250-50 MCG/DOSE AEPB Inhale 1 puff into the lungs 2 (two) times daily.     Alpha-Lipoic Acid 200 MG TABS Take 200 mg by mouth 2 (two) times daily.      apixaban (ELIQUIS) 5 MG TABS tablet Take 1 tablet (5 mg total) by mouth 2 (two) times daily. 180 tablet 3   Ascorbic Acid (VITAMIN C) 1000 MG tablet Take 1,000 mg by mouth daily.     calcium-vitamin D (OSCAL WITH D) 500-200 MG-UNIT tablet Take 1 tablet by mouth daily.     cetirizine (ZYRTEC) 10 MG tablet Take 10 mg by mouth 2 (two) times  daily.     Cholecalciferol (VITAMIN D) 50 MCG (2000 UT) CAPS Take 2,000 Units by mouth daily.      ferrous sulfate 325 (65 FE) MG EC tablet Take 325 mg by mouth every other day.     meclizine (ANTIVERT) 25 MG tablet Take 25 mg by mouth every 8 (eight) hours as needed.      Melatonin 10 MG TABS Take 25 mg by mouth at bedtime.      metFORMIN (GLUCOPHAGE) 500 MG tablet Take 1,000 mg by mouth daily.      metoprolol succinate (TOPROL-XL) 50 MG 24 hr tablet Take 100 mg in the morning and 50 mg in the evening. Take with or immediately following a meal. 252 tablet 4   Multiple Vitamin (MULTIVITAMIN) tablet Take 1 tablet by mouth daily.      OXYGEN Inhale 2 L into the lungs at bedtime.      pravastatin (PRAVACHOL) 20 MG tablet Take 1 tablet (20 mg total) daily by mouth. 90 tablet 3   PROAIR HFA 108 (90 Base) MCG/ACT inhaler Inhale 2 puffs into the lungs as directed. Every 4-6 hours as needed for wheezing or sob     Probiotic Product (PROBIOTIC DAILY PO) Take 1 capsule by mouth daily.      vitamin B-12 (CYANOCOBALAMIN) 1000 MCG tablet Take 1,000 mcg by mouth daily.     flecainide (TAMBOCOR) 100 MG tablet TAKE 200 MG ONCE THEN TAKE 100 MG TWICE DAILY 180 tablet 1   No current facility-administered medications for this visit.    Physical Exam: Vitals:   09/10/20 1050  BP: (!) 146/90  Pulse: 65  Resp: 16  SpO2: 95%  Weight: 218 lb 3.2 oz (99 kg)  Height: 5\' 3"  (1.6 m)    GEN- The patient is well appearing, alert and oriented x 3 today.   Head- normocephalic, atraumatic Eyes-  Sclera clear, conjunctiva pink Ears- hearing intact Oropharynx- clear Lungs- Clear to ausculation bilaterally, normal work of breathing Heart- irregular rate and rhythm  GI- soft  Extremities- no clubbing, cyanosis,trace edema  Wt Readings from Last 3 Encounters:  09/10/20 218 lb 3.2 oz (99 kg)  08/16/20 221 lb (100.2 kg)  07/21/20 220 lb 11.2 oz (100.1 kg)    EKG tracing ordered today is personally reviewed and shows afib  Assessment and Plan:  1. Paroxysmal atrial flutter/ atrial fibrillation Burden is 39 % by ILR (previously 2.6%)   Therapeutic strategies for afib including rate control and rhythm control were discussed in detail with the patient today. Risk, benefits, and alternatives to flecainide were discussed.  She wishes to proceed. We will therefore give flecainide 200mg  PO x 1, then 100mg  BID thereafter. Follow-up in 2 weeks in the AF clinic with new flecainide start  2. HTN Stable No change required today  3. Obesity Body mass index is 38.65 kg/m.  4. OSA Not compliant with therapy  Risks, benefits and  potential toxicities for medications prescribed and/or refilled reviewed with patient today.   2 weeks in the AF clinic I will see in 3 months  Thompson Grayer MD, [facility] 09/10/2020 11:07 AM

## 2020-09-10 NOTE — Patient Instructions (Addendum)
Your physician recommends that you schedule a follow-up appointment in: [facility]  Your physician has recommended you make the following change in your medication:   START FLECAINIDE - TAKE 200 MG ONCE THEN TAKE 100 MG TWICE DAILY   Thank you for choosing [facility]!!  Two Gram Sodium Diet 2000 mg  What is Sodium? Sodium is a mineral found naturally in many foods. The most significant source of sodium in the diet is table salt, which is about 40% sodium.  Processed, convenience, and preserved foods also contain a large amount of sodium.  The body needs only 500 mg of sodium daily to function,  A normal diet provides more than enough sodium even if you do not use salt.  Why Limit Sodium? A build up of sodium in the body can cause thirst, increased blood pressure, shortness of breath, and water retention.  Decreasing sodium in the diet can reduce edema and risk of heart attack or stroke associated with high blood pressure.  Keep in mind that there are many other factors involved in these health problems.  Heredity, obesity, lack of exercise, cigarette smoking, stress and what you eat all play a role.  General Guidelines:  Do not add salt at the table or in cooking.  One teaspoon of salt contains over 2 grams of sodium.  Read food labels  Avoid processed and convenience foods  Ask your dietitian before eating any foods not dicussed in the menu planning guidelines  Consult your physician if you wish to use a salt substitute or a sodium containing medication such as antacids.  Limit milk and milk products to 16 oz (2 cups) per day.  Shopping Hints:  READ LABELS!! "Dietetic" does not necessarily mean low sodium.  Salt and other sodium ingredients are often added to foods during processing.   Menu Planning Guidelines Food Group Choose More Often Avoid  Beverages (see also the milk group All fruit juices, low-sodium, salt-free  vegetables juices, low-sodium carbonated beverages Regular vegetable or tomato juices, commercially softened water used for drinking or cooking  Breads and Cereals Enriched white, wheat, rye and pumpernickel bread, hard rolls and dinner rolls; muffins, cornbread and waffles; most dry cereals, cooked cereal without added salt; unsalted crackers and breadsticks; low sodium or homemade bread crumbs Bread, rolls and crackers with salted tops; quick breads; instant hot cereals; pancakes; commercial bread stuffing; self-rising flower and biscuit mixes; regular bread crumbs or cracker crumbs  Desserts and Sweets Desserts and sweets mad with mild should be within allowance Instant pudding mixes and cake mixes  Fats Butter or margarine; vegetable oils; unsalted salad dressings, regular salad dressings limited to 1 Tbs; light, sour and heavy cream Regular salad dressings containing bacon fat, bacon bits, and salt pork; snack dips made with instant soup mixes or processed cheese; salted nuts  Fruits Most fresh, frozen and canned fruits Fruits processed with salt or sodium-containing ingredient (some dried fruits are processed with sodium sulfites        Vegetables Fresh, frozen vegetables and low- sodium canned vegetables Regular canned vegetables, sauerkraut, pickled vegetables, and others prepared in brine; frozen vegetables in sauces; vegetables seasoned with ham, bacon or salt pork  Condiments, Sauces, Miscellaneous  Salt substitute with physician's approval; pepper, herbs, spices; vinegar, lemon or lime juice; hot pepper sauce; garlic powder, onion powder, low sodium soy sauce (1 Tbs.); low sodium condiments (ketchup, chili sauce, mustard) in limited amounts (1 tsp.) fresh  ground horseradish; unsalted tortilla chips, pretzels, potato chips, popcorn, salsa (1/4 cup) Any seasoning made with salt including garlic salt, celery salt, onion salt, and seasoned salt; sea salt, rock salt, kosher salt; meat  tenderizers; monosodium glutamate; mustard, regular soy sauce, barbecue, sauce, chili sauce, teriyaki sauce, steak sauce, Worcestershire sauce, and most flavored vinegars; canned gravy and mixes; regular condiments; salted snack foods, olives, picles, relish, horseradish sauce, catsup   Food preparation: Try these seasonings Meats:    Pork Sage, onion Serve with applesauce  Chicken Poultry seasoning, thyme, parsley Serve with cranberry sauce  Lamb Curry powder, rosemary, garlic, thyme Serve with mint sauce or jelly  Veal Marjoram, basil Serve with current jelly, cranberry sauce  Beef Pepper, bay leaf Serve with dry mustard, unsalted chive butter  Fish [place] leaf, dill Serve with unsalted lemon butter, unsalted parsley butter  Vegetables:    Asparagus Lemon juice   Broccoli Lemon juice   Carrots Mustard dressing parsley, mint, nutmeg, glazed with unsalted butter and sugar   Tippin beans Marjoram, lemon juice, nutmeg,dill seed   Tomatoes Basil, marjoram, onion   Spice /blend for [employer]" 4 tsp ground thyme 1 tsp ground sage 3 tsp ground rosemary 4 tsp ground marjoram   Test your knowledge 1. A product that says "Salt Free" may still contain sodium. True or False 2. Garlic Powder and Hot Pepper Sauce an be used as alternative seasonings.True or False 3. Processed foods have more sodium than fresh foods.  True or False 4. Canned Vegetables have less sodium than froze True or False  WAYS TO DECREASE YOUR SODIUM INTAKE 1. Avoid the use of added salt in cooking and at the table.  Table salt (and other prepared seasonings which contain salt) is probably one of the greatest sources of sodium in the diet.  Unsalted foods can gain flavor from the sweet, sour, and butter taste sensations of herbs and spices.  Instead of using salt for seasoning, try the following seasonings with the foods listed.  Remember: how you use them to enhance natural food flavors is limited only by your  creativity... Allspice-Meat, fish, eggs, fruit, peas, red and yellow vegetables Almond Extract-Fruit baked goods Anise Seed-Sweet breads, fruit, carrots, beets, cottage cheese, cookies (tastes like licorice) Basil-Meat, fish, eggs, vegetables, rice, vegetables salads, soups, sauces Bay Leaf-Meat, fish, stews, poultry Burnet-Salad, vegetables (cucumber-like flavor) Caraway Seed-Bread, cookies, cottage cheese, meat, vegetables, cheese, rice Cardamon-Baked goods, fruit, soups Celery Powder or seed-Salads, salad dressings, sauces, meatloaf, soup, bread.Do not use  celery salt Chervil-Meats, salads, fish, eggs, vegetables, cottage cheese (parsley-like flavor) Chili Power-Meatloaf, chicken cheese, corn, eggplant, egg dishes Chives-Salads cottage cheese, egg dishes, soups, vegetables, sauces Cilantro-Salsa, casseroles Cinnamon-Baked goods, fruit, pork, lamb, chicken, carrots Cloves-Fruit, baked goods, fish, pot roast, Mofield beans, beets, carrots Coriander-Pastry, cookies, meat, salads, cheese (lemon-orange flavor) Cumin-Meatloaf, fish,cheese, eggs, cabbage,fruit pie (caraway flavor) [employer], fruit, eggs, fish, poultry, cottage cheese, vegetables Dill Seed-Meat, cottage cheese, poultry, vegetables, fish, salads, bread Fennel Seed-Bread, cookies, apples, pork, eggs, fish, beets, cabbage, cheese, Licorice-like flavor Garlic-(buds or powder) Salads, meat, poultry, fish, bread, butter, vegetables, potatoes.Do not  use garlic salt Ginger-Fruit, vegetables, baked goods, meat, fish, poultry Horseradish Root-Meet, vegetables, butter Lemon Juice or Extract-Vegetables, fruit, tea, baked goods, fish salads Mace-Baked goods fruit, vegetables, fish, poultry (taste like nutmeg) Maple Extract-Syrups Marjoram-Meat, chicken, fish, vegetables, breads, Molter salads (taste like Sage) Mint-Tea, lamb, sherbet, vegetables, desserts, carrots, cabbage Mustard, Dry or Seed-Cheese, eggs, meats, vegetables,  poultry Nutmeg-Baked goods, fruit, chicken, eggs,  vegetables, desserts Onion Powder-Meat, fish, poultry, vegetables, cheese, eggs, bread, rice salads (Do not use   Onion salt) Orange Extract-Desserts, baked goods Oregano-Pasta, eggs, cheese, onions, pork, lamb, fish, chicken, vegetables, Figge salads Paprika-Meat, fish, poultry, eggs, cheese, vegetables Parsley Flakes-Butter, vegetables, meat fish, poultry, eggs, bread, salads (certain forms may   Contain sodium Pepper-Meat fish, poultry, vegetables, eggs Peppermint Extract-Desserts, baked goods Poppy Seed-Eggs, bread, cheese, fruit dressings, baked goods, noodles, vegetables, cottage  [employer], poultry, meat, fish, cauliflower, turnips,eggs bread Saffron-Rice, bread, veal, chicken, fish, eggs Sage-Meat, fish, poultry, onions, eggplant, tomateos, pork, stews Savory-Eggs, salads, poultry, meat, rice, vegetables, soups, pork Tarragon-Meat, poultry, fish, eggs, butter, vegetables (licorice-like flavor)  Thyme-Meat, poultry, fish, eggs, vegetables, (clover-like flavor), sauces, soups Tumeric-Salads, butter, eggs, fish, rice, vegetables (saffron-like flavor) Vanilla Extract-Baked goods, candy Vinegar-Salads, vegetables, meat marinades Walnut Extract-baked goods, candy  2. Choose your Foods Wisely   The following is a list of foods to avoid which are high in sodium:  Meats-Avoid all smoked, canned, salt cured, dried and kosher meat and fish as well as Anchovies   [employer] meats:Bologna, Liverwurst, Pastrami Canned meat or fish  Marinated herring Caviar    Pepperoni Corned Beef   Pizza Dried chipped beef  Salami Frozen breaded fish or meat Salt pork Frankfurters or hot dogs  Sardines Gefilte fish   Sausage Ham (boiled ham, Proscuitto Smoked butt    spiced ham)   Spam      TV Dinners Vegetables Canned vegetables (Regular) Relish Canned mushrooms  Sauerkraut Olives    Tomato  juice Pickles  Bakery and Dessert Products Canned puddings  Cream pies Cheesecake   Decorated cakes Cookies  Beverages/Juices Tomato juice, regular  Gatorade   V-8 vegetable juice, regular  Breads and Cereals Biscuit mixes   Salted potato chips, corn chips, pretzels Bread stuffing mixes  Salted crackers and rolls Pancake and waffle mixes Self-rising flour  Seasonings Accent    Meat sauces Barbecue sauce  Meat tenderizer Catsup    Monosodium glutamate (MSG) Celery salt   Onion salt Chili sauce   Prepared mustard Garlic salt   Salt, seasoned salt, sea salt Gravy mixes   Soy sauce Horseradish   Steak sauce Ketchup   Tartar sauce Lite salt    Teriyaki sauce Marinade mixes   Worcestershire sauce  Others Baking powder   Cocoa and cocoa mixes Baking soda   Commercial casserole mixes Candy-caramels, chocolate  Dehydrated soups    Bars, fudge,nougats  Instant rice and pasta mixes Canned broth or soup  Maraschino cherries Cheese, aged and processed cheese and cheese spreads  Learning Assessment Quiz  Indicated T (for True) or F (for False) for each of the following statements:  1. _____ Fresh fruits and vegetables and unprocessed grains are generally low in sodium 2. _____ Water may contain a considerable amount of sodium, depending on the source 3. _____ You can always tell if a food is high in sodium by tasting it 4. _____ Certain laxatives my be high in sodium and should be avoided unless prescribed   by a physician or pharmacist 5. _____ Salt substitutes may be used freely by anyone on a sodium restricted diet 6. _____ Sodium is present in table salt, food additives and as a natural component of   most foods 7. _____ Table salt is approximately 90% sodium 8. _____ Limiting sodium intake may help prevent excess fluid accumulation in the body 9. _____ On a sodium-restricted diet, seasonings  such as bouillon soy sauce, and    cooking wine should be used in place of table  salt 10. _____ On an ingredient list, a product which lists monosodium glutamate as the first   ingredient is an appropriate food to include on a low sodium diet  Circle the best answer(s) to the following statements (Hint: there may be more than one correct answer)  11. On a low-sodium diet, some acceptable snack items are:    A. Olives  F. Bean dip   K. Grapefruit juice    B. Salted Pretzels G. Commercial Popcorn   L. Canned peaches    C. Carrot Sticks  H. Bouillon   M. Unsalted nuts   D. [place] fries  I. Peanut butter crackers N. Salami   E. Sweet pickles J. Tomato Juice   O. Pizza  12.  Seasonings that may be used freely on a reduced - sodium diet include   A. Lemon wedges F.Monosodium glutamate K. Celery seed    B.Soysauce   G. Pepper   L. Mustard powder   C. Sea salt  H. Cooking wine  M. Onion flakes   D. Vinegar  E. Prepared horseradish N. Salsa   E. Sage   J. Worcestershire sauce  O. Chutney

## 2020-09-12 DIAGNOSIS — I509 Heart failure, unspecified: Secondary | ICD-10-CM | POA: Diagnosis not present

## 2020-09-12 DIAGNOSIS — Z7951 Long term (current) use of inhaled steroids: Secondary | ICD-10-CM | POA: Diagnosis not present

## 2020-09-12 DIAGNOSIS — J441 Chronic obstructive pulmonary disease with (acute) exacerbation: Secondary | ICD-10-CM | POA: Diagnosis not present

## 2020-09-12 DIAGNOSIS — R0902 Hypoxemia: Secondary | ICD-10-CM | POA: Diagnosis not present

## 2020-09-12 DIAGNOSIS — I4891 Unspecified atrial fibrillation: Secondary | ICD-10-CM | POA: Diagnosis present

## 2020-09-12 DIAGNOSIS — Z9049 Acquired absence of other specified parts of digestive tract: Secondary | ICD-10-CM | POA: Diagnosis not present

## 2020-09-12 DIAGNOSIS — I517 Cardiomegaly: Secondary | ICD-10-CM | POA: Diagnosis not present

## 2020-09-12 DIAGNOSIS — J90 Pleural effusion, not elsewhere classified: Secondary | ICD-10-CM | POA: Diagnosis not present

## 2020-09-12 DIAGNOSIS — Z87891 Personal history of nicotine dependence: Secondary | ICD-10-CM | POA: Diagnosis not present

## 2020-09-12 DIAGNOSIS — Z880 Allergy status to penicillin: Secondary | ICD-10-CM | POA: Diagnosis not present

## 2020-09-12 DIAGNOSIS — E785 Hyperlipidemia, unspecified: Secondary | ICD-10-CM | POA: Diagnosis present

## 2020-09-12 DIAGNOSIS — R0602 Shortness of breath: Secondary | ICD-10-CM | POA: Diagnosis not present

## 2020-09-12 DIAGNOSIS — Z20822 Contact with and (suspected) exposure to covid-19: Secondary | ICD-10-CM | POA: Diagnosis not present

## 2020-09-12 DIAGNOSIS — J440 Chronic obstructive pulmonary disease with acute lower respiratory infection: Secondary | ICD-10-CM | POA: Diagnosis present

## 2020-09-12 DIAGNOSIS — F32A Depression, unspecified: Secondary | ICD-10-CM | POA: Diagnosis present

## 2020-09-12 DIAGNOSIS — Z602 Problems related to living alone: Secondary | ICD-10-CM | POA: Diagnosis present

## 2020-09-12 DIAGNOSIS — Z9981 Dependence on supplemental oxygen: Secondary | ICD-10-CM | POA: Diagnosis not present

## 2020-09-12 DIAGNOSIS — J9621 Acute and chronic respiratory failure with hypoxia: Secondary | ICD-10-CM | POA: Diagnosis present

## 2020-09-12 DIAGNOSIS — J9601 Acute respiratory failure with hypoxia: Secondary | ICD-10-CM | POA: Diagnosis not present

## 2020-09-12 DIAGNOSIS — Z7901 Long term (current) use of anticoagulants: Secondary | ICD-10-CM | POA: Diagnosis not present

## 2020-09-12 DIAGNOSIS — J189 Pneumonia, unspecified organism: Secondary | ICD-10-CM | POA: Diagnosis not present

## 2020-09-12 DIAGNOSIS — J9811 Atelectasis: Secondary | ICD-10-CM | POA: Diagnosis not present

## 2020-09-12 DIAGNOSIS — I10 Essential (primary) hypertension: Secondary | ICD-10-CM | POA: Diagnosis present

## 2020-09-12 DIAGNOSIS — E119 Type 2 diabetes mellitus without complications: Secondary | ICD-10-CM | POA: Diagnosis present

## 2020-09-12 DIAGNOSIS — Z7984 Long term (current) use of oral hypoglycemic drugs: Secondary | ICD-10-CM | POA: Diagnosis not present

## 2020-09-12 DIAGNOSIS — J9602 Acute respiratory failure with hypercapnia: Secondary | ICD-10-CM | POA: Diagnosis not present

## 2020-09-13 ENCOUNTER — Encounter: Payer: Self-pay | Admitting: Internal Medicine

## 2020-09-13 DIAGNOSIS — J9602 Acute respiratory failure with hypercapnia: Secondary | ICD-10-CM | POA: Diagnosis not present

## 2020-09-13 DIAGNOSIS — J441 Chronic obstructive pulmonary disease with (acute) exacerbation: Secondary | ICD-10-CM | POA: Diagnosis not present

## 2020-09-13 DIAGNOSIS — J9621 Acute and chronic respiratory failure with hypoxia: Secondary | ICD-10-CM | POA: Diagnosis not present

## 2020-09-13 DIAGNOSIS — J189 Pneumonia, unspecified organism: Secondary | ICD-10-CM | POA: Diagnosis not present

## 2020-09-14 ENCOUNTER — Ambulatory Visit (INDEPENDENT_AMBULATORY_CARE_PROVIDER_SITE_OTHER): Payer: Medicare Other

## 2020-09-14 ENCOUNTER — Telehealth: Payer: Self-pay | Admitting: Internal Medicine

## 2020-09-14 DIAGNOSIS — I4891 Unspecified atrial fibrillation: Secondary | ICD-10-CM

## 2020-09-14 LAB — CUP PACEART REMOTE DEVICE CHECK
Date Time Interrogation Session: 20211221013256
Implantable Pulse Generator Implant Date: 20190212

## 2020-09-14 NOTE — Telephone Encounter (Signed)
Pt c/o medication issue:  1. Name of Medication: flecainide (TAMBOCOR) 100 MG tablet    2. How are you currently taking this medication (dosage and times per day)? As prescribed.  3. Are you having a reaction (difficulty breathing--STAT)? No.  4. What is your medication issue? Patient saw Dr. Rayann Heman 09/10/2020 and he prescribed her Flecainide, patient states that she got admitted into the hospital Sunday 09/12/2020 and was released 09/13/2020, she is concerned about the new medication she was prescribed and wants to know if she should continue to take it.   Patient also wants to know if she should still keep her appointment with [facility] on 09/23/20. Please advise.

## 2020-09-14 NOTE — Telephone Encounter (Signed)
Spoke to the patient about her hospital stay for COPD and Pneumonia. She is concerned because she is not sure if she was given her new medication, flecainide,  when she was in the hospital. She did take one this morning and wasn't sure what to do going forward. I advised to continue as planned with the flecainide 100 mg two times a day and follow up the [facility] in the [facility] clinic.  She verbalized understanding.

## 2020-09-21 DIAGNOSIS — Z299 Encounter for prophylactic measures, unspecified: Secondary | ICD-10-CM | POA: Diagnosis not present

## 2020-09-21 DIAGNOSIS — E785 Hyperlipidemia, unspecified: Secondary | ICD-10-CM | POA: Diagnosis not present

## 2020-09-21 DIAGNOSIS — Z09 Encounter for follow-up examination after completed treatment for conditions other than malignant neoplasm: Secondary | ICD-10-CM | POA: Diagnosis not present

## 2020-09-21 DIAGNOSIS — J449 Chronic obstructive pulmonary disease, unspecified: Secondary | ICD-10-CM | POA: Diagnosis not present

## 2020-09-21 DIAGNOSIS — I10 Essential (primary) hypertension: Secondary | ICD-10-CM | POA: Diagnosis not present

## 2020-09-22 NOTE — Progress Notes (Signed)
Primary Care Physician: [name], MD Primary Electrophysiologist: Dr Rayann Heman Referring Physician: Dr Kennieth Francois is a 74 y.o. female with a history of paroxysmal atrial fibrillation, atrial flutter s/p ablation, OSA, HTN, HLD, COPD, DM who presents for follow up in the [facility]. The patient was initially diagnosed with atrial fibrillation on ILR after her atrial flutter ablation 2019. Patient is on Eliquis for a CHADS2VASC score of 4. The device clinic received an alert for increasing afib burden. Her BB was increased on 08/11/20. She was seen by Dr Rayann Heman on 09/10/20 and started on flecainide.   On follow up today, patient has been in persistent afib with her ILR showing an 82% burden despite starting flecainide. She does reports that she was hospitalized for community acquired PNA and has finished her abx. She still does have some SOB. She denies any bleeding issues on anticoagulation.   Today, she denies symptoms of palpitations, chest pain, orthopnea, PND, lower extremity edema, presyncope, syncope, bleeding, or neurologic sequela. The patient is tolerating medications without difficulties and is otherwise without complaint today.    Atrial Fibrillation Risk Factors:  she does have symptoms or diagnosis of sleep apnea. she is not compliant with CPAP therapy. she does not have a history of rheumatic fever. she does not have a history of alcohol use.  she has a BMI of Body mass index is 39.22 kg/m.Marland Kitchen Filed Weights   09/23/20 1001  Weight: 100.4 kg    Family History  Problem Relation Age of Onset   Emphysema Mother    Alcohol abuse Mother    Arthritis Mother    COPD Mother    Depression Mother    Hyperlipidemia Mother    Hypertension Mother    Heart attack Father    Cancer Father        lung   Heart disease Father    Diabetes Brother    COPD Brother    Other Maternal Grandmother        swine flu 1919    Colon cancer Neg Hx    Colon polyps Neg Hx      Atrial Fibrillation Management history:  Previous antiarrhythmic drugs: flecainide Previous cardioversions: none Previous ablations: none CHADS2VASC score: 4 Anticoagulation history: Eliquis   Past Medical History:  Diagnosis Date   Allergy    Anemia 07/25/2017   Anxiety    Arthritis    Atrial fibrillation ([place])    Cataract    COPD (chronic obstructive pulmonary disease) ([place])    Depression    Diabetes mellitus without complication ([place])    Emphysema of lung ([place])    Hyperlipidemia    Hypertension    Oxygen deficiency    Sleep apnea    Typical atrial flutter ([place])    Past Surgical History:  Procedure Laterality Date   A-FLUTTER ABLATION N/A 10/04/2017   Procedure: A-FLUTTER ABLATION;  Surgeon: Thompson Grayer, MD;  Location: [facility];  Service: Cardiovascular;  Laterality: N/A;   ABDOMINAL HYSTERECTOMY     fibroids   blood clot removal from base of brain  1990   CARPAL TUNNEL RELEASE     CATARACT EXTRACTION W/PHACO Left 06/16/2013   Procedure: CATARACT EXTRACTION PHACO AND INTRAOCULAR LENS PLACEMENT (IOC);  Surgeon: Tonny Branch, MD;  Location: [facility];  Service: Ophthalmology;  Laterality: Left;  CDE:  12.18   CATARACT EXTRACTION W/PHACO Right 06/26/2013   Procedure: CATARACT EXTRACTION PHACO AND INTRAOCULAR LENS PLACEMENT (  [place]);  Surgeon: Tonny Branch, MD;  Location: [facility];  Service: Ophthalmology;  Laterality: Right;  CDE:14.71   CESAREAN SECTION     CHOLECYSTECTOMY     COLONOSCOPY WITH PROPOFOL N/A 04/08/2019   Dr.Fields: diverticulosis, 11 simple adenomas removed, hemorrhoids. next colonoscopy in 3 years.    DILATION AND CURETTAGE OF UTERUS     ESOPHAGOGASTRODUODENOSCOPY (EGD) WITH PROPOFOL N/A 04/08/2019   Dr. Oneida Alar: H.pylori gastritis   EYE SURGERY  12/2016   FOOT NEUROMA SURGERY Right    LOOP RECORDER INSERTION N/A 11/06/2017   Procedure: LOOP RECORDER INSERTION;  Surgeon:  Thompson Grayer, MD;  Location: [facility];  Service: Cardiovascular;  Laterality: N/A;   MOUTH SURGERY     POLYPECTOMY  04/08/2019   Procedure: POLYPECTOMY;  Surgeon: Danie Binder, MD;  [facility];  Service: Endoscopy;;   TONSILLECTOMY      Current Outpatient Medications  Medication Sig Dispense Refill   acetaminophen (TYLENOL) 500 MG tablet Take 1,000 mg by mouth every 6 (six) hours as needed for moderate pain or headache.      ADVAIR DISKUS 250-50 MCG/DOSE AEPB Inhale 1 puff into the lungs 2 (two) times daily.     apixaban (ELIQUIS) 5 MG TABS tablet Take 1 tablet (5 mg total) by mouth 2 (two) times daily. 180 tablet 3   Ascorbic Acid (VITAMIN C) 1000 MG tablet Take 1,000 mg by mouth daily.     calcium-vitamin D (OSCAL WITH D) 500-200 MG-UNIT tablet Take 1 tablet by mouth daily.     cetirizine (ZYRTEC) 10 MG tablet Take 10 mg by mouth 2 (two) times daily.     Cholecalciferol (VITAMIN D) 50 MCG (2000 UT) CAPS Take 2,000 Units by mouth daily.      DULoxetine (CYMBALTA) 30 MG capsule Take 30 mg by mouth daily.     ferrous sulfate 325 (65 FE) MG EC tablet Take 325 mg by mouth every other day.     flecainide (TAMBOCOR) 100 MG tablet TAKE 200 MG ONCE THEN TAKE 100 MG TWICE DAILY 180 tablet 1   meclizine (ANTIVERT) 25 MG tablet Take 25 mg by mouth every 8 (eight) hours as needed.      Melatonin 10 MG TABS Take 25 mg by mouth at bedtime.      metFORMIN (GLUCOPHAGE) 500 MG tablet Take 1,000 mg by mouth daily.      metoprolol succinate (TOPROL-XL) 50 MG 24 hr tablet Take 100 mg in the morning and 50 mg in the evening. Take with or immediately following a meal. 252 tablet 4   Multiple Vitamins-Minerals (ONE DAILY CALCIUM/IRON) TABS Take 1 tablet by mouth daily.     OXYGEN Inhale 2 L into the lungs at bedtime.      pravastatin (PRAVACHOL) 20 MG tablet Take 1 tablet (20 mg total) daily by mouth. 90 tablet 3   PROAIR HFA 108 (90 Base) MCG/ACT inhaler Inhale 2  puffs into the lungs as directed. Every 4-6 hours as needed for wheezing or sob     Probiotic Product (PROBIOTIC DAILY PO) Take 1 capsule by mouth daily.      vitamin B-12 (CYANOCOBALAMIN) 1000 MCG tablet Take 1,000 mcg by mouth daily.     No current facility-administered medications for this encounter.    Allergies  Allergen Reactions   Other Swelling and Other (See Comments)    Local arm swelling- TB skin Test   Trazodone And Nefazodone Hives and Other (See Comments)    "blisters  my skin"    Nickel Other (See Comments)    "oozing"   Tetanus Toxoids Other (See Comments)    Left a knot   Adhesive [Tape] Itching   Bactrim [Sulfamethoxazole-Trimethoprim] Itching and Rash   Penicillins Itching, Rash and Other (See Comments)    Has patient had a PCN reaction causing immediate rash, facial/tongue/throat swelling, SOB or lightheadedness with hypotension: Unknown Has patient had a PCN reaction causing severe rash involving mucus membranes or skin necrosis: Unknown Has patient had a PCN reaction that required hospitalization: No Has patient had a PCN reaction occurring within the last 10 years: No If all of the above answers are "NO", then may proceed with Cephalosporin use   Ppd [Tuberculin Purified Protein Derivative] Swelling and Other (See Comments)    Local arm swelling   Sulfamethoxazole-Trimethoprim Itching and Rash    Social History   Socioeconomic History   Marital status: Widowed    Spouse name: Not on file   Number of children: 1   Years of education: 29   Highest education level: Not on file  Occupational History   Occupation: retired    Comment: catering, Engineer, maintenance  Tobacco Use   Smoking status: Former Smoker    Packs/day: 1.50    Types: Cigarettes    Start date: 09/25/1958    Quit date: 06/06/2001    Years since quitting: 19.3   Smokeless tobacco: Never Used  Vaping Use   Vaping Use: Never used  Substance and Sexual Activity   Alcohol use: No    Drug use: No   Sexual activity: Not Currently  Other Topics Concern   Not on file  Social History Narrative   Lives alone   Son lives in [place]   TV, reads, puzzles   Social Determinants of Health   Financial Resource Strain: Not on file  Food Insecurity: Not on file  Transportation Needs: Not on file  Physical Activity: Not on file  Stress: Not on file  Social Connections: Not on file  Intimate Partner Violence: Not on file     ROS- All systems are reviewed and negative except as per the HPI above.  Physical Exam: Vitals:   09/23/20 1001  BP: 132/78  Pulse: 91  Weight: 100.4 kg  Height: 5\' 3"  (1.6 m)    GEN- The patient is well appearing obese female, alert and oriented x 3 today.   HEENT-head normocephalic, atraumatic, sclera clear, conjunctiva pink, hearing intact, trachea midline. Lungs- mild crackles RLL, normal work of breathing Heart- irregular rate and rhythm, no murmurs, rubs or gallops  GI- soft, NT, ND, + BS Extremities- no clubbing, cyanosis, or edema MS- no significant deformity or atrophy Skin- no rash or lesion Psych- euthymic mood, full affect Neuro- strength and sensation are intact   Wt Readings from Last 3 Encounters:  09/23/20 100.4 kg  09/10/20 99 kg  08/16/20 100.2 kg    EKG today demonstrates  Afib Vent. rate 91 BPM QRS duration 108 ms QT/QTc 388/477 ms  Echo 07/22/18 demonstrated  - Left ventricle: The cavity size was normal. Wall thickness was  increased in a pattern of mild LVH. Systolic function was normal.  The estimated ejection fraction was in the range of 60% to 65%.  Left ventricular diastolic function parameters were normal.  - Aortic valve: Valve area (VTI): 2.98 cm^2. Valve area (Vmax):  2.86 cm^2.  - Atrial septum: No defect or patent foramen ovale was identified.  - Technically difficult study.   Epic  records are reviewed at length today  CHA2DS2-VASc Score = 4  The patient's score is  based upon: CHF History: No HTN History: Yes Diabetes History: Yes Stroke History: No Vascular Disease History: No Age Score: 1 Gender Score: 1      ASSESSMENT AND PLAN: 1. Persistent Atrial Fibrillation/atrial flutter The patient's CHA2DS2-VASc score is 4, indicating a 4.8% annual risk of stroke.   Patient appears to be in persistent afib now. Will plan for DCCV. Check bmet/CBC today. Continue flecainide 100 mg BID Continue Eliquis 5 mg BID. Patient denies any missed doses in the last 3 weeks.  Continue Toprol 100 mg AM and 50 mg PM  2. Secondary Hypercoagulable State (ICD10:  D68.69) The patient is at significant risk for stroke/thromboembolism based upon her CHA2DS2-VASc Score of 4.  Continue Apixaban (Eliquis).   3. Obesity Body mass index is 39.22 kg/m. Lifestyle modification was discussed and encouraged including regular physical activity and weight reduction.  4. Obstructive sleep apnea Patient is not compliant with CPAP therapy. She has an appointment to establish care with Dr Maxwell Caul.   5. HTN Stable, no changes today.   Follow up in the AF clinic one week post DCCV. Dr Rayann Heman as scheduled.    [facility] [address] [place], [place] [address] [phone] 09/23/2020 10:29 AM

## 2020-09-22 NOTE — H&P (View-Only) (Signed)
Primary Care Physician: [name], MD Primary Electrophysiologist: Dr Rayann Heman Referring Physician: Dr Kennieth Francois is a 74 y.o. female with a history of paroxysmal atrial fibrillation, atrial flutter s/p ablation, OSA, HTN, HLD, COPD, DM who presents for follow up in the [facility]. The patient was initially diagnosed with atrial fibrillation on ILR after her atrial flutter ablation 2019. Patient is on Eliquis for a CHADS2VASC score of 4. The device clinic received an alert for increasing afib burden. Her BB was increased on 08/11/20. She was seen by Dr Rayann Heman on 09/10/20 and started on flecainide.   On follow up today, patient has been in persistent afib with her ILR showing an 82% burden despite starting flecainide. She does reports that she was hospitalized for community acquired PNA and has finished her abx. She still does have some SOB. She denies any bleeding issues on anticoagulation.   Today, she denies symptoms of palpitations, chest pain, orthopnea, PND, lower extremity edema, presyncope, syncope, bleeding, or neurologic sequela. The patient is tolerating medications without difficulties and is otherwise without complaint today.    Atrial Fibrillation Risk Factors:  she does have symptoms or diagnosis of sleep apnea. she is not compliant with CPAP therapy. she does not have a history of rheumatic fever. she does not have a history of alcohol use.  she has a BMI of Body mass index is 39.22 kg/m.Marland Kitchen Filed Weights   09/23/20 1001  Weight: 100.4 kg    Family History  Problem Relation Age of Onset  . Emphysema Mother   . Alcohol abuse Mother   . Arthritis Mother   . COPD Mother   . Depression Mother   . Hyperlipidemia Mother   . Hypertension Mother   . Heart attack Father   . Cancer Father        lung  . Heart disease Father   . Diabetes Brother   . COPD Brother   . Other Maternal Grandmother        swine flu 1919  .  Colon cancer Neg Hx   . Colon polyps Neg Hx      Atrial Fibrillation Management history:  Previous antiarrhythmic drugs: flecainide Previous cardioversions: none Previous ablations: none CHADS2VASC score: 4 Anticoagulation history: Eliquis   Past Medical History:  Diagnosis Date  . Allergy   . Anemia 07/25/2017  . Anxiety   . Arthritis   . Atrial fibrillation ([place])   . Cataract   . COPD (chronic obstructive pulmonary disease) ([place])   . Depression   . Diabetes mellitus without complication ([place])   . Emphysema of lung ([place])   . Hyperlipidemia   . Hypertension   . Oxygen deficiency   . Sleep apnea   . Typical atrial flutter [facility])    Past Surgical History:  Procedure Laterality Date  . A-FLUTTER ABLATION N/A 10/04/2017   Procedure: A-FLUTTER ABLATION;  Surgeon: Thompson Grayer, MD;  Location: [facility];  Service: Cardiovascular;  Laterality: N/A;  . ABDOMINAL HYSTERECTOMY     fibroids  . blood clot removal from base of brain  1990  . CARPAL TUNNEL RELEASE    . CATARACT EXTRACTION W/PHACO Left 06/16/2013   Procedure: CATARACT EXTRACTION PHACO AND INTRAOCULAR LENS PLACEMENT (IOC);  Surgeon: Tonny Branch, MD;  Location: [facility];  Service: Ophthalmology;  Laterality: Left;  CDE:  12.18  . CATARACT EXTRACTION W/PHACO Right 06/26/2013   Procedure: CATARACT EXTRACTION PHACO AND INTRAOCULAR LENS PLACEMENT (  [place]);  Surgeon: Tonny Branch, MD;  Location: [facility];  Service: Ophthalmology;  Laterality: Right;  CDE:14.71  . CESAREAN SECTION    . CHOLECYSTECTOMY    . COLONOSCOPY WITH PROPOFOL N/A 04/08/2019   Dr.Fields: diverticulosis, 11 simple adenomas removed, hemorrhoids. next colonoscopy in 3 years.   Marland Kitchen DILATION AND CURETTAGE OF UTERUS    . ESOPHAGOGASTRODUODENOSCOPY (EGD) WITH PROPOFOL N/A 04/08/2019   Dr. Oneida Alar: H.pylori gastritis  . EYE SURGERY  12/2016  . FOOT NEUROMA SURGERY Right   . LOOP RECORDER INSERTION N/A 11/06/2017   Procedure: LOOP RECORDER INSERTION;  Surgeon:  Thompson Grayer, MD;  Location: [facility];  Service: Cardiovascular;  Laterality: N/A;  . MOUTH SURGERY    . POLYPECTOMY  04/08/2019   Procedure: POLYPECTOMY;  Surgeon: Danie Binder, MD;  [facility];  Service: Endoscopy;;  . TONSILLECTOMY      Current Outpatient Medications  Medication Sig Dispense Refill  . acetaminophen (TYLENOL) 500 MG tablet Take 1,000 mg by mouth every 6 (six) hours as needed for moderate pain or headache.     . ADVAIR DISKUS 250-50 MCG/DOSE AEPB Inhale 1 puff into the lungs 2 (two) times daily.    Marland Kitchen apixaban (ELIQUIS) 5 MG TABS tablet Take 1 tablet (5 mg total) by mouth 2 (two) times daily. 180 tablet 3  . Ascorbic Acid (VITAMIN C) 1000 MG tablet Take 1,000 mg by mouth daily.    . calcium-vitamin D (OSCAL WITH D) 500-200 MG-UNIT tablet Take 1 tablet by mouth daily.    . cetirizine (ZYRTEC) 10 MG tablet Take 10 mg by mouth 2 (two) times daily.    . Cholecalciferol (VITAMIN D) 50 MCG (2000 UT) CAPS Take 2,000 Units by mouth daily.     . DULoxetine (CYMBALTA) 30 MG capsule Take 30 mg by mouth daily.    . ferrous sulfate 325 (65 FE) MG EC tablet Take 325 mg by mouth every other day.    . flecainide (TAMBOCOR) 100 MG tablet TAKE 200 MG ONCE THEN TAKE 100 MG TWICE DAILY 180 tablet 1  . meclizine (ANTIVERT) 25 MG tablet Take 25 mg by mouth every 8 (eight) hours as needed.     . Melatonin 10 MG TABS Take 25 mg by mouth at bedtime.     . metFORMIN (GLUCOPHAGE) 500 MG tablet Take 1,000 mg by mouth daily.     . metoprolol succinate (TOPROL-XL) 50 MG 24 hr tablet Take 100 mg in the morning and 50 mg in the evening. Take with or immediately following a meal. 252 tablet 4  . Multiple Vitamins-Minerals (ONE DAILY CALCIUM/IRON) TABS Take 1 tablet by mouth daily.    . OXYGEN Inhale 2 L into the lungs at bedtime.     . pravastatin (PRAVACHOL) 20 MG tablet Take 1 tablet (20 mg total) daily by mouth. 90 tablet 3  . PROAIR HFA 108 (90 Base) MCG/ACT inhaler Inhale 2  puffs into the lungs as directed. Every 4-6 hours as needed for wheezing or sob    . Probiotic Product (PROBIOTIC DAILY PO) Take 1 capsule by mouth daily.     . vitamin B-12 (CYANOCOBALAMIN) 1000 MCG tablet Take 1,000 mcg by mouth daily.     No current facility-administered medications for this encounter.    Allergies  Allergen Reactions  . Other Swelling and Other (See Comments)    Local arm swelling- TB skin Test  . Trazodone And Nefazodone Hives and Other (See Comments)    "blisters  my skin"   . Nickel Other (See Comments)    "oozing"  . Tetanus Toxoids Other (See Comments)    Left a knot  . Adhesive [Tape] Itching  . Bactrim [Sulfamethoxazole-Trimethoprim] Itching and Rash  . Penicillins Itching, Rash and Other (See Comments)    Has patient had a PCN reaction causing immediate rash, facial/tongue/throat swelling, SOB or lightheadedness with hypotension: Unknown Has patient had a PCN reaction causing severe rash involving mucus membranes or skin necrosis: Unknown Has patient had a PCN reaction that required hospitalization: No Has patient had a PCN reaction occurring within the last 10 years: No If all of the above answers are "NO", then may proceed with Cephalosporin use  . Ppd [Tuberculin Purified Protein Derivative] Swelling and Other (See Comments)    Local arm swelling  . Sulfamethoxazole-Trimethoprim Itching and Rash    Social History   Socioeconomic History  . Marital status: Widowed    Spouse name: Not on file  . Number of children: 1  . Years of education: 69  . Highest education level: Not on file  Occupational History  . Occupation: retired    Comment: catering, Engineer, maintenance  Tobacco Use  . Smoking status: Former Smoker    Packs/day: 1.50    Types: Cigarettes    Start date: 09/25/1958    Quit date: 06/06/2001    Years since quitting: 19.3  . Smokeless tobacco: Never Used  Vaping Use  . Vaping Use: Never used  Substance and Sexual Activity  . Alcohol use: No   . Drug use: No  . Sexual activity: Not Currently  Other Topics Concern  . Not on file  Social History Narrative   Lives alone   Son lives in [place]   TV, reads, puzzles   Social Determinants of Health   Financial Resource Strain: Not on file  Food Insecurity: Not on file  Transportation Needs: Not on file  Physical Activity: Not on file  Stress: Not on file  Social Connections: Not on file  Intimate Partner Violence: Not on file     ROS- All systems are reviewed and negative except as per the HPI above.  Physical Exam: Vitals:   09/23/20 1001  BP: 132/78  Pulse: 91  Weight: 100.4 kg  Height: 5\' 3"  (1.6 m)    GEN- The patient is well appearing obese female, alert and oriented x 3 today.   HEENT-head normocephalic, atraumatic, sclera clear, conjunctiva pink, hearing intact, trachea midline. Lungs- mild crackles RLL, normal work of breathing Heart- irregular rate and rhythm, no murmurs, rubs or gallops  GI- soft, NT, ND, + BS Extremities- no clubbing, cyanosis, or edema MS- no significant deformity or atrophy Skin- no rash or lesion Psych- euthymic mood, full affect Neuro- strength and sensation are intact   Wt Readings from Last 3 Encounters:  09/23/20 100.4 kg  09/10/20 99 kg  08/16/20 100.2 kg    EKG today demonstrates  Afib Vent. rate 91 BPM QRS duration 108 ms QT/QTc 388/477 ms  Echo 07/22/18 demonstrated  - Left ventricle: The cavity size was normal. Wall thickness was  increased in a pattern of mild LVH. Systolic function was normal.  The estimated ejection fraction was in the range of 60% to 65%.  Left ventricular diastolic function parameters were normal.  - Aortic valve: Valve area (VTI): 2.98 cm^2. Valve area (Vmax):  2.86 cm^2.  - Atrial septum: No defect or patent foramen ovale was identified.  - Technically difficult study.   Epic  records are reviewed at length today  CHA2DS2-VASc Score = 4  The patient's score is  based upon: CHF History: No HTN History: Yes Diabetes History: Yes Stroke History: No Vascular Disease History: No Age Score: 1 Gender Score: 1      ASSESSMENT AND PLAN: 1. Persistent Atrial Fibrillation/atrial flutter The patient's CHA2DS2-VASc score is 4, indicating a 4.8% annual risk of stroke.   Patient appears to be in persistent afib now. Will plan for DCCV. Check bmet/CBC today. Continue flecainide 100 mg BID Continue Eliquis 5 mg BID. Patient denies any missed doses in the last 3 weeks.  Continue Toprol 100 mg AM and 50 mg PM  2. Secondary Hypercoagulable State (ICD10:  D68.69) The patient is at significant risk for stroke/thromboembolism based upon her CHA2DS2-VASc Score of 4.  Continue Apixaban (Eliquis).   3. Obesity Body mass index is 39.22 kg/m. Lifestyle modification was discussed and encouraged including regular physical activity and weight reduction.  4. Obstructive sleep apnea Patient is not compliant with CPAP therapy. She has an appointment to establish care with Dr Maxwell Caul.   5. HTN Stable, no changes today.   Follow up in the AF clinic one week post DCCV. Dr Rayann Heman as scheduled.    [facility] [address] [place], [place] [address] [phone] 09/23/2020 10:29 AM

## 2020-09-23 ENCOUNTER — Encounter (HOSPITAL_COMMUNITY): Payer: Self-pay | Admitting: Physician Assistant

## 2020-09-23 ENCOUNTER — Other Ambulatory Visit: Payer: Self-pay

## 2020-09-23 ENCOUNTER — Ambulatory Visit (HOSPITAL_COMMUNITY)
Admission: RE | Admit: 2020-09-23 | Discharge: 2020-09-23 | Disposition: A | Discharge status: Home / Self Care | Payer: Medicare Other | Source: Ambulatory Visit | Attending: Physician Assistant | Admitting: Physician Assistant

## 2020-09-23 VITALS — BP 132/78 | HR 91 | Ht 63.0 in | Wt 221.4 lb

## 2020-09-23 DIAGNOSIS — Z87891 Personal history of nicotine dependence: Secondary | ICD-10-CM | POA: Diagnosis not present

## 2020-09-23 DIAGNOSIS — G4733 Obstructive sleep apnea (adult) (pediatric): Secondary | ICD-10-CM | POA: Insufficient documentation

## 2020-09-23 DIAGNOSIS — J449 Chronic obstructive pulmonary disease, unspecified: Secondary | ICD-10-CM | POA: Diagnosis not present

## 2020-09-23 DIAGNOSIS — D6869 Other thrombophilia: Secondary | ICD-10-CM | POA: Diagnosis not present

## 2020-09-23 DIAGNOSIS — E669 Obesity, unspecified: Secondary | ICD-10-CM | POA: Diagnosis not present

## 2020-09-23 DIAGNOSIS — Z7901 Long term (current) use of anticoagulants: Secondary | ICD-10-CM | POA: Diagnosis not present

## 2020-09-23 DIAGNOSIS — E1165 Type 2 diabetes mellitus with hyperglycemia: Secondary | ICD-10-CM | POA: Diagnosis not present

## 2020-09-23 DIAGNOSIS — Z6839 Body mass index (BMI) 39.0-39.9, adult: Secondary | ICD-10-CM | POA: Diagnosis not present

## 2020-09-23 DIAGNOSIS — I10 Essential (primary) hypertension: Secondary | ICD-10-CM | POA: Diagnosis not present

## 2020-09-23 DIAGNOSIS — I4819 Other persistent atrial fibrillation: Secondary | ICD-10-CM | POA: Diagnosis not present

## 2020-09-23 DIAGNOSIS — I4892 Unspecified atrial flutter: Secondary | ICD-10-CM | POA: Diagnosis not present

## 2020-09-23 LAB — CBC
HCT: 42.7 % (ref 36.0–46.0)
Hemoglobin: 13 g/dL (ref 12.0–15.0)
MCH: 32.6 pg (ref 26.0–34.0)
MCHC: 30.4 g/dL (ref 30.0–36.0)
MCV: 107 fL — ABNORMAL HIGH (ref 80.0–100.0)
Platelets: DECREASED 10*3/uL (ref 150–400)
RBC: 3.99 MIL/uL (ref 3.87–5.11)
RDW: 16.9 % — ABNORMAL HIGH (ref 11.5–15.5)
WBC: 6.8 10*3/uL (ref 4.0–10.5)
nRBC: 0 % (ref 0.0–0.2)

## 2020-09-23 LAB — BASIC METABOLIC PANEL
Anion gap: 10 (ref 5–15)
BUN: 13 mg/dL (ref 8–23)
CO2: 29 mmol/L (ref 22–32)
Calcium: 9.4 mg/dL (ref 8.9–10.3)
Chloride: 102 mmol/L (ref 98–111)
Creatinine, Ser: 0.83 mg/dL (ref 0.44–1.00)
GFR, Estimated: >60 mL/min (ref >60)
Glucose, Bld: 129 mg/dL — ABNORMAL HIGH (ref 70–99)
Potassium: 4.5 mmol/L (ref 3.5–5.1)
Sodium: 141 mmol/L (ref 135–145)

## 2020-09-23 NOTE — Patient Instructions (Addendum)
Cardioversion scheduled for Monday. January 10th  - Arrive at the [facility] and go to admitting at 12pm  - Do not eat or drink anything after midnight the night prior to your procedure.  - Take all your morning medication (except diabetic medications) with a sip of water prior to arrival.  - You will not be able to drive home after your procedure.  - Do NOT miss any doses of your blood thinner - if you should miss a dose please notify our office immediately.  - If you feel as if you go back into normal rhythm prior to scheduled cardioversion, please notify our office immediately. If your procedure is canceled in the cardioversion suite you will be charged a cancellation fee.

## 2020-09-26 DIAGNOSIS — I272 Pulmonary hypertension, unspecified: Secondary | ICD-10-CM | POA: Diagnosis not present

## 2020-09-26 DIAGNOSIS — I5032 Chronic diastolic (congestive) heart failure: Secondary | ICD-10-CM | POA: Diagnosis present

## 2020-09-26 DIAGNOSIS — I517 Cardiomegaly: Secondary | ICD-10-CM | POA: Diagnosis not present

## 2020-09-26 DIAGNOSIS — R059 Cough, unspecified: Secondary | ICD-10-CM | POA: Diagnosis not present

## 2020-09-26 DIAGNOSIS — R600 Localized edema: Secondary | ICD-10-CM | POA: Diagnosis not present

## 2020-09-26 DIAGNOSIS — I509 Heart failure, unspecified: Secondary | ICD-10-CM | POA: Diagnosis not present

## 2020-09-26 DIAGNOSIS — Z6841 Body Mass Index (BMI) 40.0 and over, adult: Secondary | ICD-10-CM | POA: Diagnosis not present

## 2020-09-26 DIAGNOSIS — E785 Hyperlipidemia, unspecified: Secondary | ICD-10-CM | POA: Diagnosis present

## 2020-09-26 DIAGNOSIS — Z7901 Long term (current) use of anticoagulants: Secondary | ICD-10-CM | POA: Diagnosis not present

## 2020-09-26 DIAGNOSIS — J189 Pneumonia, unspecified organism: Secondary | ICD-10-CM | POA: Diagnosis not present

## 2020-09-26 DIAGNOSIS — J90 Pleural effusion, not elsewhere classified: Secondary | ICD-10-CM | POA: Diagnosis not present

## 2020-09-26 DIAGNOSIS — Z880 Allergy status to penicillin: Secondary | ICD-10-CM | POA: Diagnosis not present

## 2020-09-26 DIAGNOSIS — J9691 Respiratory failure, unspecified with hypoxia: Secondary | ICD-10-CM | POA: Diagnosis not present

## 2020-09-26 DIAGNOSIS — Z9981 Dependence on supplemental oxygen: Secondary | ICD-10-CM | POA: Diagnosis not present

## 2020-09-26 DIAGNOSIS — E669 Obesity, unspecified: Secondary | ICD-10-CM | POA: Diagnosis present

## 2020-09-26 DIAGNOSIS — I110 Hypertensive heart disease with heart failure: Secondary | ICD-10-CM | POA: Diagnosis present

## 2020-09-26 DIAGNOSIS — Z87891 Personal history of nicotine dependence: Secondary | ICD-10-CM | POA: Diagnosis not present

## 2020-09-26 DIAGNOSIS — J9621 Acute and chronic respiratory failure with hypoxia: Secondary | ICD-10-CM | POA: Diagnosis present

## 2020-09-26 DIAGNOSIS — J441 Chronic obstructive pulmonary disease with (acute) exacerbation: Secondary | ICD-10-CM | POA: Diagnosis not present

## 2020-09-26 DIAGNOSIS — E119 Type 2 diabetes mellitus without complications: Secondary | ICD-10-CM | POA: Diagnosis present

## 2020-09-26 DIAGNOSIS — J9602 Acute respiratory failure with hypercapnia: Secondary | ICD-10-CM | POA: Diagnosis not present

## 2020-09-26 DIAGNOSIS — Z7984 Long term (current) use of oral hypoglycemic drugs: Secondary | ICD-10-CM | POA: Diagnosis not present

## 2020-09-26 DIAGNOSIS — I4891 Unspecified atrial fibrillation: Secondary | ICD-10-CM | POA: Diagnosis present

## 2020-09-26 DIAGNOSIS — Z66 Do not resuscitate: Secondary | ICD-10-CM | POA: Diagnosis present

## 2020-09-26 DIAGNOSIS — Z20822 Contact with and (suspected) exposure to covid-19: Secondary | ICD-10-CM | POA: Diagnosis not present

## 2020-09-26 DIAGNOSIS — I10 Essential (primary) hypertension: Secondary | ICD-10-CM | POA: Diagnosis not present

## 2020-09-28 NOTE — Progress Notes (Signed)
Carelink Summary Report / Loop Recorder 

## 2020-09-29 ENCOUNTER — Telehealth: Payer: Self-pay | Admitting: Internal Medicine

## 2020-09-29 NOTE — Telephone Encounter (Signed)
    Pt would like to speak with Dr. Jackalyn Lombard nurse, she would like to discuss about the referral he sent for her with Dr. Maxwell Caul about cpap machine.

## 2020-09-29 NOTE — Telephone Encounter (Signed)
Advised patient to call Dr. Maxwell Caul office and let them know that her equipment came today and discuss appointment for tomorrow.

## 2020-10-02 ENCOUNTER — Other Ambulatory Visit (HOSPITAL_COMMUNITY)
Admission: RE | Admit: 2020-10-02 | Discharge: 2020-10-02 | Disposition: A | Discharge status: Home / Self Care | Payer: Medicare Other | Source: Ambulatory Visit | Attending: Cardiology | Admitting: Cardiology

## 2020-10-02 DIAGNOSIS — Z01812 Encounter for preprocedural laboratory examination: Secondary | ICD-10-CM | POA: Diagnosis not present

## 2020-10-02 DIAGNOSIS — Z20822 Contact with and (suspected) exposure to covid-19: Secondary | ICD-10-CM | POA: Insufficient documentation

## 2020-10-02 LAB — SARS CORONAVIRUS 2 (TAT 6-24 HRS): SARS Coronavirus 2: NEGATIVE

## 2020-10-04 ENCOUNTER — Ambulatory Visit (HOSPITAL_COMMUNITY)
Admission: RE | Admit: 2020-10-04 | Discharge: 2020-10-04 | Disposition: A | Discharge status: Home / Self Care | Payer: Medicare Other | Attending: Cardiology | Admitting: Cardiology

## 2020-10-04 ENCOUNTER — Other Ambulatory Visit: Payer: Self-pay

## 2020-10-04 ENCOUNTER — Ambulatory Visit (HOSPITAL_COMMUNITY): Payer: Medicare Other | Admitting: Certified Registered"

## 2020-10-04 ENCOUNTER — Encounter (HOSPITAL_COMMUNITY): Payer: Self-pay | Admitting: Cardiology

## 2020-10-04 ENCOUNTER — Encounter (HOSPITAL_COMMUNITY)
Admission: RE | Disposition: A | Discharge status: Home / Self Care | Payer: Self-pay | Source: Home / Self Care | Attending: Cardiology

## 2020-10-04 DIAGNOSIS — G4733 Obstructive sleep apnea (adult) (pediatric): Secondary | ICD-10-CM | POA: Insufficient documentation

## 2020-10-04 DIAGNOSIS — I4892 Unspecified atrial flutter: Secondary | ICD-10-CM | POA: Diagnosis not present

## 2020-10-04 DIAGNOSIS — Z7901 Long term (current) use of anticoagulants: Secondary | ICD-10-CM | POA: Diagnosis not present

## 2020-10-04 DIAGNOSIS — Z79899 Other long term (current) drug therapy: Secondary | ICD-10-CM | POA: Diagnosis not present

## 2020-10-04 DIAGNOSIS — Z7984 Long term (current) use of oral hypoglycemic drugs: Secondary | ICD-10-CM | POA: Insufficient documentation

## 2020-10-04 DIAGNOSIS — I10 Essential (primary) hypertension: Secondary | ICD-10-CM | POA: Diagnosis not present

## 2020-10-04 DIAGNOSIS — Z888 Allergy status to other drugs, medicaments and biological substances status: Secondary | ICD-10-CM | POA: Diagnosis not present

## 2020-10-04 DIAGNOSIS — D6869 Other thrombophilia: Secondary | ICD-10-CM | POA: Insufficient documentation

## 2020-10-04 DIAGNOSIS — Z882 Allergy status to sulfonamides status: Secondary | ICD-10-CM | POA: Diagnosis not present

## 2020-10-04 DIAGNOSIS — E669 Obesity, unspecified: Secondary | ICD-10-CM | POA: Diagnosis not present

## 2020-10-04 DIAGNOSIS — Z87891 Personal history of nicotine dependence: Secondary | ICD-10-CM | POA: Diagnosis not present

## 2020-10-04 DIAGNOSIS — F418 Other specified anxiety disorders: Secondary | ICD-10-CM | POA: Diagnosis not present

## 2020-10-04 DIAGNOSIS — Z6839 Body mass index (BMI) 39.0-39.9, adult: Secondary | ICD-10-CM | POA: Insufficient documentation

## 2020-10-04 DIAGNOSIS — I4891 Unspecified atrial fibrillation: Secondary | ICD-10-CM | POA: Diagnosis not present

## 2020-10-04 DIAGNOSIS — I4819 Other persistent atrial fibrillation: Secondary | ICD-10-CM | POA: Insufficient documentation

## 2020-10-04 DIAGNOSIS — Z880 Allergy status to penicillin: Secondary | ICD-10-CM | POA: Diagnosis not present

## 2020-10-04 DIAGNOSIS — I483 Typical atrial flutter: Secondary | ICD-10-CM | POA: Diagnosis not present

## 2020-10-04 HISTORY — PX: CARDIOVERSION: SHX1299

## 2020-10-04 LAB — GLUCOSE, CAPILLARY: Glucose-Capillary: 106 mg/dL — ABNORMAL HIGH (ref 70–99)

## 2020-10-04 SURGERY — CARDIOVERSION
Anesthesia: General

## 2020-10-04 MED ORDER — SODIUM CHLORIDE 0.9 % IV SOLN: INTRAVENOUS | Status: DC | PRN

## 2020-10-04 MED ORDER — LIDOCAINE HCL (CARDIAC) PF 100 MG/5ML IV SOSY
PREFILLED_SYRINGE | INTRAVENOUS | Status: DC | PRN
  Administered 2020-10-04: 60 mg via INTRAVENOUS

## 2020-10-04 MED ORDER — METOPROLOL SUCCINATE ER 50 MG PO TB24: ORAL_TABLET | ORAL | 4 refills | Status: DC

## 2020-10-04 MED ORDER — PROPOFOL 10 MG/ML IV BOLUS
INTRAVENOUS | Status: DC | PRN
  Administered 2020-10-04: 60 mg via INTRAVENOUS

## 2020-10-04 NOTE — Procedures (Signed)
Procedure: Electrical Cardioversion Indications:  Atrial Fibrillation  Procedure Details:  Consent: Risks of procedure as well as the alternatives and risks of each were explained to the (patient/caregiver).  Consent for procedure obtained.  Time Out: Verified patient identification, verified procedure, site/side was marked, verified correct patient position, special equipment/implants available, medications/allergies/relevent history reviewed, required imaging and test results available. PERFORMED.  Patient placed on cardiac monitor, pulse oximetry, supplemental oxygen as necessary.  Sedation given: Propofol 60mg ; lidocaine 60mg  Pacer pads placed anterior and posterior chest.  Cardioverted 1 time(s).  Cardioversion with synchronized biphasic 150J shock.  Evaluation: Findings: Post procedure EKG shows: NSR Complications: None Patient did tolerate procedure well.  Time Spent Directly with the Patient:  68minutes   Freada Bergeron 10/04/2020, 12:54 PM

## 2020-10-04 NOTE — Anesthesia Preprocedure Evaluation (Signed)
Anesthesia Evaluation  Patient identified by MRN, date of birth, ID band Patient awake    Reviewed: Allergy & Precautions, H&P , NPO status , Patient's Chart, lab work & pertinent test results  Airway Mallampati: II   Neck ROM: full    Dental   Pulmonary sleep apnea , COPD,  oxygen dependent, former smoker,    breath sounds clear to auscultation       Cardiovascular hypertension, + dysrhythmias Atrial Fibrillation  Rhythm:irregular Rate:Normal     Neuro/Psych PSYCHIATRIC DISORDERS Anxiety Depression    GI/Hepatic   Endo/Other  diabetes, Type 2Morbid obesity  Renal/GU      Musculoskeletal  (+) Arthritis ,   Abdominal   Peds  Hematology   Anesthesia Other Findings   Reproductive/Obstetrics                             Anesthesia Physical Anesthesia Plan  ASA: III  Anesthesia Plan: General   Post-op Pain Management:    Induction: Intravenous  PONV Risk Score and Plan: 3 and Propofol infusion and Treatment may vary due to age or medical condition  Airway Management Planned: Mask  Additional Equipment:   Intra-op Plan:   Post-operative Plan:   Informed Consent: I have reviewed the patients History and Physical, chart, labs and discussed the procedure including the risks, benefits and alternatives for the proposed anesthesia with the patient or authorized representative who has indicated his/her understanding and acceptance.       Plan Discussed with: Anesthesiologist and CRNA  Anesthesia Plan Comments:         Anesthesia Quick Evaluation

## 2020-10-04 NOTE — Anesthesia Postprocedure Evaluation (Signed)
Anesthesia Post Note  Patient: Linda [name]  Procedure(s) Performed: CARDIOVERSION (N/A )     Patient location during evaluation: PACU Anesthesia Type: General Level of consciousness: awake and alert Pain management: pain level controlled Vital Signs Assessment: post-procedure vital signs reviewed and stable Respiratory status: spontaneous breathing, nonlabored ventilation, respiratory function stable and patient connected to nasal cannula oxygen Cardiovascular status: blood pressure returned to baseline and stable Postop Assessment: no apparent nausea or vomiting Anesthetic complications: no   No complications documented.  Last Vitals:  Vitals:   10/04/20 1315 10/04/20 1325  BP: (!) 153/71 (!) 161/82  Pulse: (!) 56 (!) 57  Resp: (!) 24 (!) 23  Temp:    SpO2: 100% 100%    Last Pain:  Vitals:   10/04/20 1325  TempSrc:   PainSc: 0-No pain                 Catalina Gravel

## 2020-10-04 NOTE — Transfer of Care (Signed)
Immediate Anesthesia Transfer of Care Note  Patient: Linda [name]  Procedure(s) Performed: CARDIOVERSION (N/A )  Patient Location: PACU  Anesthesia Type:General  Level of Consciousness: awake, alert  and patient cooperative  Airway & Oxygen Therapy: Patient Spontanous Breathing and Patient connected to nasal cannula oxygen  Post-op Assessment: Report given to RN and Post -op Vital signs reviewed and stable  Post vital signs: Reviewed and stable  Last Vitals:  Vitals Value Taken Time  BP    Temp    Pulse    Resp 15 10/04/20 1258  SpO2    Vitals shown include unvalidated device data.  Last Pain:  Vitals:   10/04/20 1226  TempSrc: Oral  PainSc: 0-No pain         Complications: No complications documented.

## 2020-10-04 NOTE — Interval H&P Note (Signed)
History and Physical Interval Note:  10/04/2020 11:55 AM  Linda [name]  has presented today for surgery, with the diagnosis of AFIB.  The various methods of treatment have been discussed with the patient and family. After consideration of risks, benefits and other options for treatment, the patient has consented to  Procedure(s): CARDIOVERSION (N/A) as a surgical intervention.  The patient's history has been reviewed, patient examined, no change in status, stable for surgery.  I have reviewed the patient's chart and labs.  Questions were answered to the patient's satisfaction.     Freada Bergeron

## 2020-10-04 NOTE — Discharge Instructions (Signed)
Electrical Cardioversion Electrical cardioversion is the delivery of a jolt of electricity to restore a normal rhythm to the heart. A rhythm that is too fast or is not regular keeps the heart from pumping well. In this procedure, sticky patches or metal paddles are placed on the chest to deliver electricity to the heart from a device. This procedure may be done in an emergency if:  There is low or no blood pressure as a result of the heart rhythm.  Normal rhythm must be restored as fast as possible to protect the brain and heart from further damage.  It may save a life. This may also be a scheduled procedure for irregular or fast heart rhythms that are not immediately life-threatening. Tell a health care provider about:  Any allergies you have.  All medicines you are taking, including vitamins, herbs, eye drops, creams, and over-the-counter medicines.  Any problems you or family members have had with anesthetic medicines.  Any blood disorders you have.  Any surgeries you have had.  Any medical conditions you have.  Whether you are pregnant or may be pregnant. What are the risks? Generally, this is a safe procedure. However, problems may occur, including:  Allergic reactions to medicines.  A blood clot that breaks free and travels to other parts of your body.  The possible return of an abnormal heart rhythm within hours or days after the procedure.  Your heart stopping (cardiac arrest). This is rare. What happens before the procedure? Medicines  Your health care provider may have you start taking: ? Blood-thinning medicines (anticoagulants) so your blood does not clot as easily. ? Medicines to help stabilize your heart rate and rhythm.  Ask your health care provider about: ? Changing or stopping your regular medicines. This is especially important if you are taking diabetes medicines or blood thinners. ? Taking medicines such as aspirin and ibuprofen. These medicines can  thin your blood. Do not take these medicines unless your health care provider tells you to take them. ? Taking over-the-counter medicines, vitamins, herbs, and supplements. General instructions  Follow instructions from your health care provider about eating or drinking restrictions.  Plan to have someone take you home from the hospital or clinic.  If you will be going home right after the procedure, plan to have someone with you for 24 hours.  Ask your health care provider what steps will be taken to help prevent infection. These may include washing your skin with a germ-killing soap. What happens during the procedure?  An IV will be inserted into one of your veins.  Sticky patches (electrodes) or metal paddles may be placed on your chest.  You will be given a medicine to help you relax (sedative).  An electrical shock will be delivered. The procedure may vary among health care providers and hospitals.   What can I expect after the procedure?  Your blood pressure, heart rate, breathing rate, and blood oxygen level will be monitored until you leave the hospital or clinic.  Your heart rhythm will be watched to make sure it does not change.  You may have some redness on the skin where the shocks were given. Follow these instructions at home:  Do not drive for 24 hours if you were given a sedative during your procedure.  Take over-the-counter and prescription medicines only as told by your health care provider.  Ask your health care provider how to check your pulse. Check it often.  Rest for 48 hours after the procedure   or as told by your health care provider.  Avoid or limit your caffeine use as told by your health care provider.  Keep all follow-up visits as told by your health care provider. This is important. Contact a health care provider if:  You feel like your heart is beating too quickly or your pulse is not regular.  You have a serious muscle cramp that does not go  away. Get help right away if:  You have discomfort in your chest.  You are dizzy or you feel faint.  You have trouble breathing or you are short of breath.  Your speech is slurred.  You have trouble moving an arm or leg on one side of your body.  Your fingers or toes turn cold or blue. Summary  Electrical cardioversion is the delivery of a jolt of electricity to restore a normal rhythm to the heart.  This procedure may be done right away in an emergency or may be a scheduled procedure if the condition is not an emergency.  Generally, this is a safe procedure.  After the procedure, check your pulse often as told by your health care provider. This information is not intended to replace advice given to you by your health care provider. Make sure you discuss any questions you have with your health care provider. Document Revised: 04/14/2019 Document Reviewed: 04/14/2019 Elsevier Patient Education  2021 Elsevier Inc.  

## 2020-10-05 DIAGNOSIS — I4891 Unspecified atrial fibrillation: Secondary | ICD-10-CM | POA: Diagnosis not present

## 2020-10-05 DIAGNOSIS — Z299 Encounter for prophylactic measures, unspecified: Secondary | ICD-10-CM | POA: Diagnosis not present

## 2020-10-05 DIAGNOSIS — I10 Essential (primary) hypertension: Secondary | ICD-10-CM | POA: Diagnosis not present

## 2020-10-05 DIAGNOSIS — J449 Chronic obstructive pulmonary disease, unspecified: Secondary | ICD-10-CM | POA: Diagnosis not present

## 2020-10-05 DIAGNOSIS — Z6841 Body Mass Index (BMI) 40.0 and over, adult: Secondary | ICD-10-CM | POA: Diagnosis not present

## 2020-10-05 DIAGNOSIS — E1165 Type 2 diabetes mellitus with hyperglycemia: Secondary | ICD-10-CM | POA: Diagnosis not present

## 2020-10-08 DIAGNOSIS — E1165 Type 2 diabetes mellitus with hyperglycemia: Secondary | ICD-10-CM | POA: Diagnosis not present

## 2020-10-08 DIAGNOSIS — Z9181 History of falling: Secondary | ICD-10-CM | POA: Diagnosis not present

## 2020-10-08 DIAGNOSIS — F32A Depression, unspecified: Secondary | ICD-10-CM | POA: Diagnosis not present

## 2020-10-08 DIAGNOSIS — Z7984 Long term (current) use of oral hypoglycemic drugs: Secondary | ICD-10-CM | POA: Diagnosis not present

## 2020-10-08 DIAGNOSIS — Z87891 Personal history of nicotine dependence: Secondary | ICD-10-CM | POA: Diagnosis not present

## 2020-10-08 DIAGNOSIS — E1140 Type 2 diabetes mellitus with diabetic neuropathy, unspecified: Secondary | ICD-10-CM | POA: Diagnosis not present

## 2020-10-08 DIAGNOSIS — I4891 Unspecified atrial fibrillation: Secondary | ICD-10-CM | POA: Diagnosis not present

## 2020-10-08 DIAGNOSIS — Z7951 Long term (current) use of inhaled steroids: Secondary | ICD-10-CM | POA: Diagnosis not present

## 2020-10-08 DIAGNOSIS — I10 Essential (primary) hypertension: Secondary | ICD-10-CM | POA: Diagnosis not present

## 2020-10-08 DIAGNOSIS — J449 Chronic obstructive pulmonary disease, unspecified: Secondary | ICD-10-CM | POA: Diagnosis not present

## 2020-10-08 DIAGNOSIS — Z7901 Long term (current) use of anticoagulants: Secondary | ICD-10-CM | POA: Diagnosis not present

## 2020-10-12 NOTE — Progress Notes (Incomplete)
Primary Care Physician: [name], MD Primary Electrophysiologist: Dr Rayann Heman Referring Physician: 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 female with a history of paroxysmal atrial fibrillation, atrial flutter s/p ablation, OSA, HTN, HLD, COPD, DM who presents for follow up in the [facility]. The patient was initially diagnosed with atrial fibrillation on ILR after her atrial flutter ablation 2019. Patient is on Eliquis for a CHADS2VASC score of 4. The device clinic received an alert for increasing afib burden. Her BB was increased on 08/11/20. She was seen by Dr Rayann Heman on 09/10/20 and started on flecainide. Patient was persistent in afib with her ILR showing an 82% burden despite starting flecainide. She does report that she was hospitalized for community acquired PNA.  On follow up today, patient is s/p DCCV on 10/04/20. ***still in afib.  Today, she denies symptoms of ***palpitations, chest pain, orthopnea, PND, lower extremity edema, presyncope, syncope, bleeding, or neurologic sequela. The patient is tolerating medications without difficulties and is otherwise without complaint today.    Atrial Fibrillation Risk Factors:  she does have symptoms or diagnosis of sleep apnea. she is not compliant with CPAP therapy. she does not have a history of rheumatic fever. she does not have a history of alcohol use.  she has a BMI of There is no height or weight on file to calculate BMI.. There were no vitals filed for this visit.  Family History  Problem Relation Age of Onset  . Emphysema Mother   . Alcohol abuse Mother   . Arthritis Mother   . COPD Mother   . Depression Mother   . Hyperlipidemia Mother   . Hypertension Mother   . Heart attack Father   . Cancer Father        lung  . Heart disease Father   . Diabetes Brother   . COPD Brother   . Other Maternal Grandmother        swine flu 1919  . Colon cancer Neg Hx   . Colon polyps Neg Hx       Atrial Fibrillation Management history:  Previous antiarrhythmic drugs: flecainide Previous cardioversions: 10/04/20 Previous ablations: none CHADS2VASC score: 4 Anticoagulation history: Eliquis   Past Medical History:  Diagnosis Date  . Allergy   . Anemia 07/25/2017  . Anxiety   . Arthritis   . Atrial fibrillation ([place])   . Cataract   . COPD (chronic obstructive pulmonary disease) ([place])   . Depression   . Diabetes mellitus without complication ([place])   . Emphysema of lung ([place])   . Hyperlipidemia   . Hypertension   . Oxygen deficiency   . Sleep apnea   . Typical atrial flutter [facility])    Past Surgical History:  Procedure Laterality Date  . A-FLUTTER ABLATION N/A 10/04/2017   Procedure: A-FLUTTER ABLATION;  Surgeon: Thompson Grayer, MD;  Location: [place] [facility];  Service: Cardiovascular;  Laterality: N/A;  . ABDOMINAL HYSTERECTOMY     fibroids  . blood clot removal from base of brain  1990  . CARDIOVERSION N/A 10/04/2020   Procedure: CARDIOVERSION;  Surgeon: Freada Bergeron, MD;  Location: [facility] ENDOSCOPY;  Service: Cardiovascular;  Laterality: N/A;  . CARPAL TUNNEL RELEASE    . CATARACT EXTRACTION W/PHACO Left 06/16/2013   Procedure: CATARACT EXTRACTION PHACO AND INTRAOCULAR LENS PLACEMENT (IOC);  Surgeon: Tonny Branch, MD;  Location: [facility] ORS;  Service: Ophthalmology;  Laterality: Left;  CDE:  12.18  . CATARACT  EXTRACTION W/PHACO Right 06/26/2013   Procedure: CATARACT EXTRACTION PHACO AND INTRAOCULAR LENS PLACEMENT (IOC);  Surgeon: Tonny Branch, MD;  Location: [facility] ORS;  Service: Ophthalmology;  Laterality: Right;  CDE:14.71  . CESAREAN SECTION    . CHOLECYSTECTOMY    . COLONOSCOPY WITH PROPOFOL N/A 04/08/2019   Dr.Fields: diverticulosis, 11 simple adenomas removed, hemorrhoids. next colonoscopy in 3 years.   Marland Kitchen DILATION AND CURETTAGE OF UTERUS    . ESOPHAGOGASTRODUODENOSCOPY (EGD) WITH PROPOFOL N/A 04/08/2019   Dr. Oneida Alar: H.pylori gastritis  . EYE SURGERY  12/2016  .  FOOT NEUROMA SURGERY Right   . LOOP RECORDER INSERTION N/A 11/06/2017   Procedure: LOOP RECORDER INSERTION;  Surgeon: Thompson Grayer, MD;  Location: [place] [facility];  Service: Cardiovascular;  Laterality: N/A;  . MOUTH SURGERY    . POLYPECTOMY  04/08/2019   Procedure: POLYPECTOMY;  Surgeon: Danie Binder, MD;  Location: [facility] SU[facility];  Service: Endoscopy;;  . TONSILLECTOMY      Current Outpatient Medications  Medication Sig Dispense Refill  . acetaminophen (TYLENOL) 500 MG tablet Take 1,000 mg by mouth every 6 (six) hours as needed for moderate pain or headache.     . ADVAIR DISKUS 250-50 MCG/DOSE AEPB Inhale 1 puff into the lungs 2 (two) times daily.    Marland Kitchen apixaban (ELIQUIS) 5 MG TABS tablet Take 1 tablet (5 mg total) by mouth 2 (two) times daily. 180 tablet 3  . Ascorbic Acid (VITAMIN C) 1000 MG tablet Take 1,000 mg by mouth daily.    . calcium-vitamin D (OSCAL WITH D) 500-200 MG-UNIT tablet Take 1 tablet by mouth daily.    . cefUROXime (CEFTIN) 500 MG tablet Take 500 mg by mouth 2 (two) times daily.    . cetirizine (ZYRTEC) 10 MG tablet Take 10 mg by mouth daily.    . Cholecalciferol (VITAMIN D3 SUPER STRENGTH) 50 MCG (2000 UT) TABS Take 2,000 Units by mouth daily at 12 noon.    . DULoxetine (CYMBALTA) 30 MG capsule Take 30 mg by mouth daily.    . ferrous sulfate 325 (65 FE) MG EC tablet Take 325 mg by mouth every other day.    . flecainide (TAMBOCOR) 100 MG tablet TAKE 200 MG ONCE THEN TAKE 100 MG TWICE DAILY (Patient taking differently: Take 100-200 mg by mouth See admin instructions. Take 200 MG in the morning and TAKE 100 MG at bedtime) 180 tablet 1  . Melatonin 10 MG TABS Take 10 mg by mouth at bedtime.    . metFORMIN (GLUCOPHAGE) 500 MG tablet Take 1,000 mg by mouth daily.     . metoprolol succinate (TOPROL-XL) 50 MG 24 hr tablet Take 25 mg in the morning and 25 mg in the evening. Take with or immediately following a meal. Only take the medication if the heart rate is >60bpm. 252  tablet 4  . Multiple Vitamins-Minerals (ONE DAILY CALCIUM/IRON) TABS Take 1 tablet by mouth daily.    . OXYGEN Inhale 2 L into the lungs at bedtime.     . pravastatin (PRAVACHOL) 20 MG tablet Take 1 tablet (20 mg total) daily by mouth. 90 tablet 3  . PROAIR HFA 108 (90 Base) MCG/ACT inhaler Inhale 2 puffs into the lungs See admin instructions. Every 4-6 hours as needed for wheezing or sob    . Probiotic Product (PROBIOTIC DAILY PO) Take 1 capsule by mouth daily.     . vitamin B-12 (CYANOCOBALAMIN) 1000 MCG tablet Take 1,000 mcg by mouth daily.  No current facility-administered medications for this visit.    Allergies  Allergen Reactions  . Other Swelling and Other (See Comments)    Local arm swelling- TB skin Test  . Trazodone And Nefazodone Hives and Other (See Comments)    "blisters my skin"   . Nickel Other (See Comments)    "oozing"  . Tetanus Toxoids Other (See Comments)    Left a knot  . Adhesive [Tape] Itching  . Bactrim [Sulfamethoxazole-Trimethoprim] Itching and Rash  . Penicillins Itching, Rash and Other (See Comments)    Has patient had a PCN reaction causing immediate rash, facial/tongue/throat swelling, SOB or lightheadedness with hypotension: Unknown Has patient had a PCN reaction causing severe rash involving mucus membranes or skin necrosis: Unknown Has patient had a PCN reaction that required hospitalization: No Has patient had a PCN reaction occurring within the last 10 years: No If all of the above answers are "NO", then may proceed with Cephalosporin use  . Ppd [Tuberculin Purified Protein Derivative] Swelling and Other (See Comments)    Local arm swelling  . Sulfamethoxazole-Trimethoprim Itching and Rash    Social History   Socioeconomic History  . Marital status: Widowed    Spouse name: Not on file  . Number of children: 1  . Years of education: 20  . Highest education level: Not on file  Occupational History  . Occupation: retired    Comment:  catering, Engineer, maintenance  Tobacco Use  . Smoking status: Former Smoker    Packs/day: 1.50    Types: Cigarettes    Start date: 09/25/1958    Quit date: 06/06/2001    Years since quitting: 19.3  . Smokeless tobacco: Never Used  Vaping Use  . Vaping Use: Never used  Substance and Sexual Activity  . Alcohol use: No  . Drug use: No  . Sexual activity: Not Currently  Other Topics Concern  . Not on file  Social History Narrative   Lives alone   Son lives in [place]   TV, reads, puzzles   Social Determinants of Health   Financial Resource Strain: Not on file  Food Insecurity: Not on file  Transportation Needs: Not on file  Physical Activity: Not on file  Stress: Not on file  Social Connections: Not on file  Intimate Partner Violence: Not on file     ROS- All systems are reviewed and negative except as per the HPI above.  Physical Exam: There were no vitals filed for this visit.  GEN- The patient is well appearing, alert and oriented x 3 today.   HEENT-head normocephalic, atraumatic, sclera clear, conjunctiva pink, hearing intact, trachea midline. Lungs- Clear to ausculation bilaterally, normal work of breathing Heart- ***Regular rate and rhythm, no murmurs, rubs or gallops  GI- soft, NT, ND, + BS Extremities- no clubbing, cyanosis, or edema MS- no significant deformity or atrophy Skin- no rash or lesion Psych- euthymic mood, full affect Neuro- strength and sensation are intact   Wt Readings from Last 3 Encounters:  10/04/20 98 kg  09/23/20 100.4 kg  09/10/20 99 kg    EKG today demonstrates  ***  Echo 07/22/18 demonstrated  - Left ventricle: The cavity size was normal. Wall thickness was  increased in a pattern of mild LVH. Systolic function was normal.  The estimated ejection fraction was in the range of 60% to 65%.  Left ventricular diastolic function parameters were normal.  - Aortic valve: Valve area (VTI): 2.98 cm^2. Valve area (Vmax):  2.86 cm^2.   -  Atrial septum: No defect or patent foramen ovale was identified.  - Technically difficult study.   Epic records are reviewed at length today  CHA2DS2-VASc Score = 4  The patient's score is based upon: CHF History: No HTN History: Yes Diabetes History: Yes Stroke History: No Vascular Disease History: No Age Score: 1 Gender Score: 1      ASSESSMENT AND PLAN: 1. Persistent Atrial Fibrillation/atrial flutter The patient's CHA2DS2-VASc score is 4, indicating a 4.8% annual risk of stroke.   S/p DCCV on 10/04/20 ***tik Continue flecainide 100 mg BID Continue Eliquis 5 mg BID Continue Toprol 100 mg AM and 50 mg PM  2. Secondary Hypercoagulable State (ICD10:  D68.69) The patient is at significant risk for stroke/thromboembolism based upon her CHA2DS2-VASc Score of 4.  Continue Apixaban (Eliquis).   3. Obesity There is no height or weight on file to calculate BMI. Lifestyle modification was discussed and encouraged including regular physical activity and weight reduction. ***  4. Obstructive sleep apnea Patient is not compliant with CPAP therapy.*** She has an appointment to establish care with Dr Maxwell Caul.   5. HTN Stable, no changes today. ***   Follow up with Dr Rayann Heman as scheduled. ***   Adline Peals PA-C [facility] [address] [place], [place] [address] [phone] 10/12/2020 2:29 PM

## 2020-10-13 ENCOUNTER — Inpatient Hospital Stay (HOSPITAL_COMMUNITY): Admission: RE | Admit: 2020-10-13 | Payer: Medicare Other | Source: Ambulatory Visit | Admitting: Physician Assistant

## 2020-10-14 DIAGNOSIS — I10 Essential (primary) hypertension: Secondary | ICD-10-CM | POA: Diagnosis not present

## 2020-10-14 DIAGNOSIS — I4891 Unspecified atrial fibrillation: Secondary | ICD-10-CM | POA: Diagnosis not present

## 2020-10-14 DIAGNOSIS — E1140 Type 2 diabetes mellitus with diabetic neuropathy, unspecified: Secondary | ICD-10-CM | POA: Diagnosis not present

## 2020-10-14 DIAGNOSIS — E1165 Type 2 diabetes mellitus with hyperglycemia: Secondary | ICD-10-CM | POA: Diagnosis not present

## 2020-10-14 DIAGNOSIS — J449 Chronic obstructive pulmonary disease, unspecified: Secondary | ICD-10-CM | POA: Diagnosis not present

## 2020-10-14 DIAGNOSIS — F32A Depression, unspecified: Secondary | ICD-10-CM | POA: Diagnosis not present

## 2020-10-15 ENCOUNTER — Ambulatory Visit (INDEPENDENT_AMBULATORY_CARE_PROVIDER_SITE_OTHER): Payer: Medicare Other

## 2020-10-15 DIAGNOSIS — E1140 Type 2 diabetes mellitus with diabetic neuropathy, unspecified: Secondary | ICD-10-CM | POA: Diagnosis not present

## 2020-10-15 DIAGNOSIS — I4891 Unspecified atrial fibrillation: Secondary | ICD-10-CM | POA: Diagnosis not present

## 2020-10-15 DIAGNOSIS — F32A Depression, unspecified: Secondary | ICD-10-CM | POA: Diagnosis not present

## 2020-10-15 DIAGNOSIS — E1165 Type 2 diabetes mellitus with hyperglycemia: Secondary | ICD-10-CM | POA: Diagnosis not present

## 2020-10-15 DIAGNOSIS — J449 Chronic obstructive pulmonary disease, unspecified: Secondary | ICD-10-CM | POA: Diagnosis not present

## 2020-10-15 DIAGNOSIS — I10 Essential (primary) hypertension: Secondary | ICD-10-CM | POA: Diagnosis not present

## 2020-10-15 LAB — CUP PACEART REMOTE DEVICE CHECK
Date Time Interrogation Session: 20220121013716
Implantable Pulse Generator Implant Date: 20190212

## 2020-10-18 ENCOUNTER — Ambulatory Visit: Payer: Medicare Other | Admitting: Diagnostic Neuroimaging

## 2020-10-20 ENCOUNTER — Ambulatory Visit (HOSPITAL_COMMUNITY)
Admission: RE | Admit: 2020-10-20 | Discharge: 2020-10-20 | Disposition: A | Discharge status: Home / Self Care | Payer: Medicare Other | Source: Ambulatory Visit | Attending: Physician Assistant | Admitting: Physician Assistant

## 2020-10-20 ENCOUNTER — Encounter (HOSPITAL_COMMUNITY): Payer: Self-pay | Admitting: Physician Assistant

## 2020-10-20 ENCOUNTER — Other Ambulatory Visit: Payer: Self-pay

## 2020-10-20 VITALS — BP 126/70 | HR 100 | Ht 62.5 in | Wt 214.4 lb

## 2020-10-20 DIAGNOSIS — Z79899 Other long term (current) drug therapy: Secondary | ICD-10-CM | POA: Insufficient documentation

## 2020-10-20 DIAGNOSIS — Z7901 Long term (current) use of anticoagulants: Secondary | ICD-10-CM | POA: Insufficient documentation

## 2020-10-20 DIAGNOSIS — E119 Type 2 diabetes mellitus without complications: Secondary | ICD-10-CM | POA: Insufficient documentation

## 2020-10-20 DIAGNOSIS — Z7984 Long term (current) use of oral hypoglycemic drugs: Secondary | ICD-10-CM | POA: Diagnosis not present

## 2020-10-20 DIAGNOSIS — E1165 Type 2 diabetes mellitus with hyperglycemia: Secondary | ICD-10-CM | POA: Diagnosis not present

## 2020-10-20 DIAGNOSIS — E669 Obesity, unspecified: Secondary | ICD-10-CM | POA: Insufficient documentation

## 2020-10-20 DIAGNOSIS — J449 Chronic obstructive pulmonary disease, unspecified: Secondary | ICD-10-CM | POA: Diagnosis not present

## 2020-10-20 DIAGNOSIS — Z8249 Family history of ischemic heart disease and other diseases of the circulatory system: Secondary | ICD-10-CM | POA: Insufficient documentation

## 2020-10-20 DIAGNOSIS — D6869 Other thrombophilia: Secondary | ICD-10-CM | POA: Insufficient documentation

## 2020-10-20 DIAGNOSIS — I4819 Other persistent atrial fibrillation: Secondary | ICD-10-CM

## 2020-10-20 DIAGNOSIS — Z6838 Body mass index (BMI) 38.0-38.9, adult: Secondary | ICD-10-CM | POA: Diagnosis not present

## 2020-10-20 DIAGNOSIS — I4892 Unspecified atrial flutter: Secondary | ICD-10-CM | POA: Diagnosis not present

## 2020-10-20 DIAGNOSIS — F32A Depression, unspecified: Secondary | ICD-10-CM | POA: Diagnosis not present

## 2020-10-20 DIAGNOSIS — G4733 Obstructive sleep apnea (adult) (pediatric): Secondary | ICD-10-CM | POA: Diagnosis not present

## 2020-10-20 DIAGNOSIS — E785 Hyperlipidemia, unspecified: Secondary | ICD-10-CM | POA: Diagnosis not present

## 2020-10-20 DIAGNOSIS — Z87891 Personal history of nicotine dependence: Secondary | ICD-10-CM | POA: Insufficient documentation

## 2020-10-20 DIAGNOSIS — I10 Essential (primary) hypertension: Secondary | ICD-10-CM | POA: Insufficient documentation

## 2020-10-20 NOTE — Progress Notes (Signed)
Primary Care Physician: [name], MD Primary Electrophysiologist: Dr Rayann Heman Referring Physician: 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 female with a history of persistent atrial fibrillation, atrial flutter s/p ablation, OSA, HTN, HLD, COPD, DM who presents for follow up in the [facility]. The patient was initially diagnosed with atrial fibrillation on ILR after her atrial flutter ablation 2019. Patient is on Eliquis for a CHADS2VASC score of 4. The device clinic received an alert for increasing afib burden. Her BB was increased on 08/11/20. She was seen by Dr Rayann Heman on 09/10/20 and started on flecainide. Patient was persistent in afib with her ILR showing an 82% burden despite starting flecainide. She does report that she was hospitalized for community acquired PNA.  On follow up today, patient is s/p DCCV on 10/04/20. Unfortunately, she quickly reverted back to afib. She does feel more SOB when she is in afib and has to use her nasal canula O2 more frequently. She denies any bleeding issues on anticoagulation. She does report having low BP readings and her BB was reduced to 25 mg BID.   Today, she denies symptoms of palpitations, chest pain, orthopnea, PND, lower extremity edema, presyncope, syncope, bleeding, or neurologic sequela. The patient is tolerating medications without difficulties and is otherwise without complaint today.    Atrial Fibrillation Risk Factors:  she does have symptoms or diagnosis of sleep apnea. she is not compliant with CPAP therapy. she does not have a history of rheumatic fever. she does not have a history of alcohol use.  she has a BMI of Body mass index is 38.59 kg/m.Marland Kitchen Filed Weights   10/20/20 1538  Weight: 97.3 kg    Family History  Problem Relation Age of Onset  . Emphysema Mother   . Alcohol abuse Mother   . Arthritis Mother   . COPD Mother   . Depression Mother   . Hyperlipidemia Mother   . Hypertension  Mother   . Heart attack Father   . Cancer Father        lung  . Heart disease Father   . Diabetes Brother   . COPD Brother   . Other Maternal Grandmother        swine flu 1919  . Colon cancer Neg Hx   . Colon polyps Neg Hx      Atrial Fibrillation Management history:  Previous antiarrhythmic drugs: flecainide Previous cardioversions: 10/04/20 Previous ablations: none CHADS2VASC score: 4 Anticoagulation history: Eliquis   Past Medical History:  Diagnosis Date  . Allergy   . Anemia 07/25/2017  . Anxiety   . Arthritis   . Atrial fibrillation ([place])   . Cataract   . COPD (chronic obstructive pulmonary disease) ([place])   . Depression   . Diabetes mellitus without complication ([place])   . Emphysema of lung ([place])   . Hyperlipidemia   . Hypertension   . Oxygen deficiency   . Sleep apnea   . Typical atrial flutter [facility])    Past Surgical History:  Procedure Laterality Date  . A-FLUTTER ABLATION N/A 10/04/2017   Procedure: A-FLUTTER ABLATION;  Surgeon: Thompson Grayer, MD;  Location: [place] [facility];  Service: Cardiovascular;  Laterality: N/A;  . ABDOMINAL HYSTERECTOMY     fibroids  . blood clot removal from base of brain  1990  . CARDIOVERSION N/A 10/04/2020   Procedure: CARDIOVERSION;  Surgeon: Freada Bergeron, MD;  Location: [facility] ENDOSCOPY;  Service: Cardiovascular;  Laterality: N/A;  .  CARPAL TUNNEL RELEASE    . CATARACT EXTRACTION W/PHACO Left 06/16/2013   Procedure: CATARACT EXTRACTION PHACO AND INTRAOCULAR LENS PLACEMENT (IOC);  Surgeon: Tonny Branch, MD;  Location: [facility] ORS;  Service: Ophthalmology;  Laterality: Left;  CDE:  12.18  . CATARACT EXTRACTION W/PHACO Right 06/26/2013   Procedure: CATARACT EXTRACTION PHACO AND INTRAOCULAR LENS PLACEMENT (IOC);  Surgeon: Tonny Branch, MD;  Location: [facility] ORS;  Service: Ophthalmology;  Laterality: Right;  CDE:14.71  . CESAREAN SECTION    . CHOLECYSTECTOMY    . COLONOSCOPY WITH PROPOFOL N/A 04/08/2019   Dr.Fields: diverticulosis, 11  simple adenomas removed, hemorrhoids. next colonoscopy in 3 years.   Marland Kitchen DILATION AND CURETTAGE OF UTERUS    . ESOPHAGOGASTRODUODENOSCOPY (EGD) WITH PROPOFOL N/A 04/08/2019   Dr. Oneida Alar: H.pylori gastritis  . EYE SURGERY  12/2016  . FOOT NEUROMA SURGERY Right   . LOOP RECORDER INSERTION N/A 11/06/2017   Procedure: LOOP RECORDER INSERTION;  Surgeon: Thompson Grayer, MD;  Location: [place] [facility] LAB;  Service: Cardiovascular;  Laterality: N/A;  . MOUTH SURGERY    . POLYPECTOMY  04/08/2019   Procedure: POLYPECTOMY;  Surgeon: Danie Binder, MD;  [facility]: [facility];  Service: Endoscopy;;  . TONSILLECTOMY      Current Outpatient Medications  Medication Sig Dispense Refill  . acetaminophen (TYLENOL) 500 MG tablet Take 1,000 mg by mouth every 6 (six) hours as needed for moderate pain or headache.     . ADVAIR DISKUS 250-50 MCG/DOSE AEPB Inhale 1 puff into the lungs 2 (two) times daily.    Marland Kitchen apixaban (ELIQUIS) 5 MG TABS tablet Take 1 tablet (5 mg total) by mouth 2 (two) times daily. 180 tablet 3  . Ascorbic Acid (VITAMIN C) 1000 MG tablet Take 1,000 mg by mouth daily.    . calcium-vitamin D (OSCAL WITH D) 500-200 MG-UNIT tablet Take 1 tablet by mouth daily.    . cetirizine (ZYRTEC) 10 MG tablet Take 10 mg by mouth daily.    . Cholecalciferol (VITAMIN D3 SUPER STRENGTH) 50 MCG (2000 UT) TABS Take 2,000 Units by mouth daily at 12 noon.    . DULoxetine (CYMBALTA) 30 MG capsule Take 30 mg by mouth daily.    . ferrous sulfate 325 (65 FE) MG EC tablet Take 325 mg by mouth every other day.    . flecainide (TAMBOCOR) 100 MG tablet TAKE 200 MG ONCE THEN TAKE 100 MG TWICE DAILY 180 tablet 1  . Melatonin 10 MG TABS Take 10 mg by mouth at bedtime.    . metFORMIN (GLUCOPHAGE) 500 MG tablet Take 1,000 mg by mouth daily.     . metoprolol succinate (TOPROL-XL) 50 MG 24 hr tablet Take 25 mg in the morning and 25 mg in the evening. Take with or immediately following a meal. Only take the medication if the heart  rate is >60bpm. 252 tablet 4  . Multiple Vitamins-Minerals (ONE DAILY CALCIUM/IRON) TABS Take 1 tablet by mouth daily.    . OXYGEN Inhale 2 L into the lungs at bedtime.     . pravastatin (PRAVACHOL) 20 MG tablet Take 1 tablet (20 mg total) daily by mouth. 90 tablet 3  . PROAIR HFA 108 (90 Base) MCG/ACT inhaler Inhale 2 puffs into the lungs See admin instructions. Every 4-6 hours as needed for wheezing or sob    . Probiotic Product (PROBIOTIC DAILY PO) Take 1 capsule by mouth daily.     . vitamin B-12 (CYANOCOBALAMIN) 1000 MCG tablet Take 1,000 mcg by mouth daily.  No current facility-administered medications for this encounter.    Allergies  Allergen Reactions  . Other Swelling and Other (See Comments)    Local arm swelling- TB skin Test  . Trazodone And Nefazodone Hives and Other (See Comments)    "blisters my skin"   . Nickel Other (See Comments)    "oozing"  . Tetanus Toxoids Other (See Comments)    Left a knot  . Adhesive [Tape] Itching  . Bactrim [Sulfamethoxazole-Trimethoprim] Itching and Rash  . Penicillins Itching, Rash and Other (See Comments)    Has patient had a PCN reaction causing immediate rash, facial/tongue/throat swelling, SOB or lightheadedness with hypotension: Unknown Has patient had a PCN reaction causing severe rash involving mucus membranes or skin necrosis: Unknown Has patient had a PCN reaction that required hospitalization: No Has patient had a PCN reaction occurring within the last 10 years: No If all of the above answers are "NO", then may proceed with Cephalosporin use  . Ppd [Tuberculin Purified Protein Derivative] Swelling and Other (See Comments)    Local arm swelling  . Sulfamethoxazole-Trimethoprim Itching and Rash    Social History   Socioeconomic History  . Marital status: Widowed    Spouse name: Not on file  . Number of children: 1  . Years of education: 24  . Highest education level: Not on file  Occupational History  . Occupation:  retired    Comment: catering, Engineer, maintenance  Tobacco Use  . Smoking status: Former Smoker    Packs/day: 1.50    Types: Cigarettes    Start date: 09/25/1958    Quit date: 06/06/2001    Years since quitting: 19.3  . Smokeless tobacco: Never Used  Vaping Use  . Vaping Use: Never used  Substance and Sexual Activity  . Alcohol use: No  . Drug use: No  . Sexual activity: Not Currently  Other Topics Concern  . Not on file  Social History Narrative   Lives alone   Son lives in [place]   TV, reads, puzzles   Social Determinants of Health   Financial Resource Strain: Not on file  Food Insecurity: Not on file  Transportation Needs: Not on file  Physical Activity: Not on file  Stress: Not on file  Social Connections: Not on file  Intimate Partner Violence: Not on file     ROS- All systems are reviewed and negative except as per the HPI above.  Physical Exam: Vitals:   10/20/20 1538  BP: 126/70  Pulse: 100  Weight: 97.3 kg  Height: 5' 2.5" (1.588 m)    GEN- The patient is well appearing obese female, alert and oriented x 3 today.   HEENT-head normocephalic, atraumatic, sclera clear, conjunctiva pink, hearing intact, trachea midline. Lungs- Clear to ausculation bilaterally, diminished breath sounds, normal work of breathing Heart- irregular rate and rhythm, no murmurs, rubs or gallops  GI- soft, NT, ND, + BS Extremities- no clubbing, cyanosis, or edema MS- no significant deformity or atrophy Skin- no rash or lesion Psych- euthymic mood, full affect Neuro- strength and sensation are intact   Wt Readings from Last 3 Encounters:  10/20/20 97.3 kg  10/04/20 98 kg  09/23/20 100.4 kg    EKG today demonstrates  Afib Vent. rate 100 BPM QRS duration 110 ms QT/QTc 350/451 ms   Echo 07/22/18 demonstrated  - Left ventricle: The cavity size was normal. Wall thickness was  increased in a pattern of mild LVH. Systolic function was normal.  The estimated ejection  fraction  was in the range of 60% to 65%.  Left ventricular diastolic function parameters were normal.  - Aortic valve: Valve area (VTI): 2.98 cm^2. Valve area (Vmax):  2.86 cm^2.  - Atrial septum: No defect or patent foramen ovale was identified.  - Technically difficult study.   Epic records are reviewed at length today  CHA2DS2-VASc Score = 4  The patient's score is based upon: CHF History: No HTN History: Yes Diabetes History: Yes Stroke History: No Vascular Disease History: No Age Score: 1 Gender Score: 1      ASSESSMENT AND PLAN: 1. Persistent Atrial Fibrillation/atrial flutter The patient's CHA2DS2-VASc score is 4, indicating a 4.8% annual risk of stroke.   S/p DCCV on 10/04/20 Unfortunately, she is back in persistent afib. We discussed rate vs rhythm control again today. She does think she feels less SOB in SR.  Will plan for dofetilide admission once COVID hospitalizations decrease. Rate control for now. Check bmet/mag today. Patient to check on price of medication.  Stop flecainide Continue Eliquis 5 mg BID Continue Toprol 25 mg BID  2. Secondary Hypercoagulable State (ICD10:  D68.69) The patient is at significant risk for stroke/thromboembolism based upon her CHA2DS2-VASc Score of 4.  Continue Apixaban (Eliquis).   3. Obesity Body mass index is 38.59 kg/m. Lifestyle modification was discussed and encouraged including regular physical activity and weight reduction.  4. Obstructive sleep apnea Patient has new CPAP machine and is using it regularly.   5. HTN Stable, no changes today.   Follow up with Dr Rayann Heman as scheduled. AF clinic once dofetilide admission available.    [facility] [address] [place], [place] [address] [phone] 10/20/2020 3:45 PM

## 2020-10-20 NOTE — Patient Instructions (Signed)
Stop flecainide 

## 2020-10-22 DIAGNOSIS — I10 Essential (primary) hypertension: Secondary | ICD-10-CM | POA: Diagnosis not present

## 2020-10-22 DIAGNOSIS — Z299 Encounter for prophylactic measures, unspecified: Secondary | ICD-10-CM | POA: Diagnosis not present

## 2020-10-22 DIAGNOSIS — J449 Chronic obstructive pulmonary disease, unspecified: Secondary | ICD-10-CM | POA: Diagnosis not present

## 2020-10-22 DIAGNOSIS — E1165 Type 2 diabetes mellitus with hyperglycemia: Secondary | ICD-10-CM | POA: Diagnosis not present

## 2020-10-22 DIAGNOSIS — I4891 Unspecified atrial fibrillation: Secondary | ICD-10-CM | POA: Diagnosis not present

## 2020-10-25 DIAGNOSIS — E1165 Type 2 diabetes mellitus with hyperglycemia: Secondary | ICD-10-CM | POA: Diagnosis not present

## 2020-10-27 DIAGNOSIS — J449 Chronic obstructive pulmonary disease, unspecified: Secondary | ICD-10-CM | POA: Diagnosis not present

## 2020-10-27 DIAGNOSIS — E1165 Type 2 diabetes mellitus with hyperglycemia: Secondary | ICD-10-CM | POA: Diagnosis not present

## 2020-10-27 DIAGNOSIS — I4891 Unspecified atrial fibrillation: Secondary | ICD-10-CM | POA: Diagnosis not present

## 2020-10-27 DIAGNOSIS — I10 Essential (primary) hypertension: Secondary | ICD-10-CM | POA: Diagnosis not present

## 2020-10-27 DIAGNOSIS — E1140 Type 2 diabetes mellitus with diabetic neuropathy, unspecified: Secondary | ICD-10-CM | POA: Diagnosis not present

## 2020-10-27 DIAGNOSIS — F32A Depression, unspecified: Secondary | ICD-10-CM | POA: Diagnosis not present

## 2020-10-27 NOTE — Progress Notes (Signed)
Carelink Summary Report / Loop Recorder 

## 2020-10-28 ENCOUNTER — Ambulatory Visit (INDEPENDENT_AMBULATORY_CARE_PROVIDER_SITE_OTHER): Payer: Medicare Other | Admitting: Internal Medicine

## 2020-10-28 ENCOUNTER — Encounter: Payer: Self-pay | Admitting: Internal Medicine

## 2020-10-28 ENCOUNTER — Other Ambulatory Visit: Payer: Self-pay

## 2020-10-28 DIAGNOSIS — Z86010 Personal history of colonic polyps: Secondary | ICD-10-CM

## 2020-10-28 DIAGNOSIS — K219 Gastro-esophageal reflux disease without esophagitis: Secondary | ICD-10-CM

## 2020-10-28 HISTORY — DX: Gastro-esophageal reflux disease without esophagitis: K21.9

## 2020-10-28 NOTE — Patient Instructions (Signed)
Continue on iron supplementation for your chronic anemia.  Your blood counts look good.  We will plan on colonoscopy in July 2023.  Follow-up in 1 year or sooner if needed.  At [facility] we value your feedback. You may receive a survey about your visit today. Please share your experience as we strive to create trusting relationships with our patients to provide genuine, compassionate, quality care.  We appreciate your understanding and patience as we review any laboratory studies, imaging, and other diagnostic tests that are ordered as we care for you. Our office policy is 5 business days for review of these results, and any emergent or urgent results are addressed in a timely manner for your best interest. If you do not hear from our office in 1 week, please contact us.   We also encourage the use of MyChart, which contains your medical information for your review as well. If you are not enrolled in this feature, an access code is on this after visit summary for your convenience. Thank you for allowing [place] to be involved in your care.  It was great to see you today!  I hope you have a great rest of your winter!!    Linda [name]. [name], D.O. Gastroenterology and Hepatology [facility]

## 2020-10-28 NOTE — Progress Notes (Signed)
Referring Provider: [name], MD Primary Care Physician:  [name], MD Primary GI:  Dr. [name]  Chief Complaint  Patient presents with  . Anemia    Doing fine    HPI:   Linda [name] is a 75 y.o. female who presents to the clinic today for follow-up visit.  Last colonoscopy July 2020 with 11 polyps removed many of which were tubular adenomas.  Colonoscopy recall 2023.  EGD at that time also showed H. pylori gastritis.  She completed course of antibiotics with negative stool antigen testing afterwards.  Also has a history of iron deficiency anemia and is chronically on iron supplementation.  Most recent hemoglobin 13.  States she is doing well from a GI standpoint.  Notes intermittent reflux which is well controlled on Tums as needed.  No dysphagia odynophagia.  Past Medical History:  Diagnosis Date  . Allergy   . Anemia 07/25/2017  . Anxiety   . Arthritis   . Atrial fibrillation ([place])   . Cataract   . COPD (chronic obstructive pulmonary disease) ([place])   . Depression   . Diabetes mellitus without complication ([place])   . Emphysema of lung ([place])   . GERD (gastroesophageal reflux disease) 10/28/2020  . Hyperlipidemia   . Hypertension   . Oxygen deficiency   . Sleep apnea   . Typical atrial flutter [facility])     Past Surgical History:  Procedure Laterality Date  . A-FLUTTER ABLATION N/A 10/04/2017   Procedure: A-FLUTTER ABLATION;  Surgeon: Thompson Grayer, MD;  Location: [place] [facility];  Service: Cardiovascular;  Laterality: N/A;  . ABDOMINAL HYSTERECTOMY     fibroids  . blood clot removal from base of brain  1990  . CARDIOVERSION N/A 10/04/2020   Procedure: CARDIOVERSION;  Surgeon: Freada Bergeron, MD;  Location[facility] ENDOSCOPY;  Service: Cardiovascular;  Laterality: N/A;  . CARPAL TUNNEL RELEASE    . CATARACT EXTRACTION W/PHACO Left 06/16/2013   Procedure: CATARACT EXTRACTION PHACO AND INTRAOCULAR LENS PLACEMENT (IOC);  Surgeon: Tonny Branch, MD;  Location: [facility]  ORS;  Service: Ophthalmology;  Laterality: Left;  CDE:  12.18  . CATARACT EXTRACTION W/PHACO Right 06/26/2013   Procedure: CATARACT EXTRACTION PHACO AND INTRAOCULAR LENS PLACEMENT (IOC);  Surgeon: Tonny Branch, MD;  Location: [facility] ORS;  Service: Ophthalmology;  Laterality: Right;  CDE:14.71  . CESAREAN SECTION    . CHOLECYSTECTOMY    . COLONOSCOPY WITH PROPOFOL N/A 04/08/2019   Dr.Fields: diverticulosis, 11 simple adenomas removed, hemorrhoids. next colonoscopy in 3 years.   Marland Kitchen DILATION AND CURETTAGE OF UTERUS    . ESOPHAGOGASTRODUODENOSCOPY (EGD) WITH PROPOFOL N/A 04/08/2019   Dr. Oneida Alar: H.pylori gastritis  . EYE SURGERY  12/2016  . FOOT NEUROMA SURGERY Right   . LOOP RECORDER INSERTION N/A 11/06/2017   Procedure: LOOP RECORDER INSERTION;  Surgeon: Thompson Grayer, MD;  Location: [place] [facility];  Service: Cardiovascular;  Laterality: N/A;  . MOUTH SURGERY    . POLYPECTOMY  04/08/2019   Procedure: POLYPECTOMY;  Surgeon: Danie Binder, MD;  [facility];  Service: Endoscopy;;  . TONSILLECTOMY      Current Outpatient Medications  Medication Sig Dispense Refill  . acetaminophen (TYLENOL) 500 MG tablet Take 1,000 mg by mouth every 6 (six) hours as needed for moderate pain or headache.     . ADVAIR DISKUS 250-50 MCG/DOSE AEPB Inhale 1 puff into the lungs 2 (two) times daily.    Marland Kitchen apixaban (ELIQUIS) 5 MG TABS tablet Take  1 tablet (5 mg total) by mouth 2 (two) times daily. 180 tablet 3  . Ascorbic Acid (VITAMIN C) 1000 MG tablet Take 1,000 mg by mouth daily.    . calcium-vitamin D (OSCAL WITH D) 500-200 MG-UNIT tablet Take 1 tablet by mouth daily.    . cetirizine (ZYRTEC) 10 MG tablet Take 10 mg by mouth daily.    . Cholecalciferol (VITAMIN D3 SUPER STRENGTH) 50 MCG (2000 UT) TABS Take 2,000 Units by mouth daily at 12 noon.    . DULoxetine (CYMBALTA) 30 MG capsule Take 30 mg by mouth daily.    . ferrous sulfate 325 (65 FE) MG EC tablet Take 325 mg by mouth every other day.    .  Melatonin 10 MG TABS Take 10 mg by mouth at bedtime.    . metFORMIN (GLUCOPHAGE) 500 MG tablet Take 1,000 mg by mouth daily.     . metoprolol succinate (TOPROL-XL) 50 MG 24 hr tablet Take 25 mg in the morning and 25 mg in the evening. Take with or immediately following a meal. Only take the medication if the heart rate is >60bpm. 252 tablet 4  . Multiple Vitamins-Minerals (ONE DAILY CALCIUM/IRON) TABS Take 1 tablet by mouth daily.    . OXYGEN Inhale 2 L into the lungs at bedtime.     . pravastatin (PRAVACHOL) 20 MG tablet Take 1 tablet (20 mg total) daily by mouth. 90 tablet 3  . PROAIR HFA 108 (90 Base) MCG/ACT inhaler Inhale 2 puffs into the lungs See admin instructions. Every 4-6 hours as needed for wheezing or sob    . Probiotic Product (PROBIOTIC DAILY PO) Take 1 capsule by mouth daily.     . vitamin B-12 (CYANOCOBALAMIN) 1000 MCG tablet Take 1,000 mcg by mouth daily.     No current facility-administered medications for this visit.    Allergies as of 10/28/2020 - Review Complete 10/28/2020  Allergen Reaction Noted  . Other Swelling and Other (See Comments) 06/09/2013  . Trazodone and nefazodone Hives and Other (See Comments) 01/10/2017  . Nickel Other (See Comments) 10/04/2017  . Tetanus toxoids Other (See Comments) 06/28/2018  . Adhesive [tape] Itching 06/09/2013  . Bactrim [sulfamethoxazole-trimethoprim] Itching and Rash 06/09/2013  . Penicillins Itching, Rash, and Other (See Comments) 06/09/2013  . Ppd [tuberculin purified protein derivative] Swelling and Other (See Comments) 06/09/2013  . Sulfamethoxazole-trimethoprim Itching and Rash 06/09/2013    Family History  Problem Relation Age of Onset  . Emphysema Mother   . Alcohol abuse Mother   . Arthritis Mother   . COPD Mother   . Depression Mother   . Hyperlipidemia Mother   . Hypertension Mother   . Heart attack Father   . Cancer Father        lung  . Heart disease Father   . Diabetes Brother   . COPD Brother   .  Other Maternal Grandmother        swine flu 1919  . Colon cancer Neg Hx   . Colon polyps Neg Hx     Social History   Socioeconomic History  . Marital status: Widowed    Spouse name: Not on file  . Number of children: 1  . Years of education: 45  . Highest education level: Not on file  Occupational History  . Occupation: retired    Comment: catering, Engineer, maintenance  Tobacco Use  . Smoking status: Former Smoker    Packs/day: 1.50    Types: Cigarettes    Start date: 09/25/1958  Quit date: 06/06/2001    Years since quitting: 19.4  . Smokeless tobacco: Never Used  Vaping Use  . Vaping Use: Never used  Substance and Sexual Activity  . Alcohol use: No  . Drug use: No  . Sexual activity: Not Currently  Other Topics Concern  . Not on file  Social History Narrative   Lives alone   Son lives in [place]   TV, reads, puzzles   Social Determinants of Health   Financial Resource Strain: Not on file  Food Insecurity: Not on file  Transportation Needs: Not on file  Physical Activity: Not on file  Stress: Not on file  Social Connections: Not on file    Subjective: Review of Systems  Constitutional: Negative for chills and fever.  HENT: Negative for congestion and hearing loss.   Eyes: Negative for blurred vision and double vision.  Respiratory: Negative for cough and shortness of breath.   Cardiovascular: Negative for chest pain and palpitations.  Gastrointestinal: Negative for abdominal pain, blood in stool, constipation, diarrhea, heartburn, melena and vomiting.  Genitourinary: Negative for dysuria and urgency.  Musculoskeletal: Negative for joint pain and myalgias.  Skin: Negative for itching and rash.  Neurological: Negative for dizziness and headaches.  Psychiatric/Behavioral: Negative for depression. The patient is not nervous/anxious.      Objective: BP (!) 153/107   Pulse 99   Temp 97.6 F (36.4 C)   Ht 5\' 3"  (1.6 m)   Wt 212 lb 9.6 oz (96.4 kg)   BMI  37.66 kg/m  Physical Exam Constitutional:      Appearance: Normal appearance.  HENT:     Head: Normocephalic and atraumatic.  Eyes:     Extraocular Movements: Extraocular movements intact.     Conjunctiva/sclera: Conjunctivae normal.  Cardiovascular:     Rate and Rhythm: Normal rate and regular rhythm.  Pulmonary:     Effort: Pulmonary effort is normal.     Breath sounds: Normal breath sounds.  Abdominal:     General: Bowel sounds are normal.     Palpations: Abdomen is soft.  Musculoskeletal:        General: No swelling. Normal range of motion.     Cervical back: Normal range of motion and neck supple.  Skin:    General: Skin is warm and dry.     Coloration: Skin is not jaundiced.  Neurological:     General: No focal deficit present.     Mental Status: She is alert and oriented to person, place, and time.  Psychiatric:        Mood and Affect: Mood normal.        Behavior: Behavior normal.      Assessment: *Iron deficiency anemia-improved, most recent hemoglobin 13 *History of numerous adenomatous polyps *Reflux-intermittent  Plan: Patient's blood counts are vastly improved, most recent hemoglobin 13.  Continue on iron supplementation.  Colonoscopy recall July 2023 due to history of adenomatous colon polyps.  Reflux well controlled on intermittent Tums.  If this worsens patient to call office and we will start her on H2 blocker.  Otherwise she can follow-up in 1 year or sooner if needed.  10/28/2020 10:13 AM   Disclaimer: This note was dictated with voice recognition software. Similar sounding words can inadvertently be transcribed and may not be corrected upon review.

## 2020-11-01 DIAGNOSIS — J449 Chronic obstructive pulmonary disease, unspecified: Secondary | ICD-10-CM | POA: Diagnosis not present

## 2020-11-01 DIAGNOSIS — I4891 Unspecified atrial fibrillation: Secondary | ICD-10-CM | POA: Diagnosis not present

## 2020-11-01 DIAGNOSIS — F32A Depression, unspecified: Secondary | ICD-10-CM | POA: Diagnosis not present

## 2020-11-01 DIAGNOSIS — I10 Essential (primary) hypertension: Secondary | ICD-10-CM | POA: Diagnosis not present

## 2020-11-01 DIAGNOSIS — E1140 Type 2 diabetes mellitus with diabetic neuropathy, unspecified: Secondary | ICD-10-CM | POA: Diagnosis not present

## 2020-11-01 DIAGNOSIS — E1165 Type 2 diabetes mellitus with hyperglycemia: Secondary | ICD-10-CM | POA: Diagnosis not present

## 2020-11-08 DIAGNOSIS — H35372 Puckering of macula, left eye: Secondary | ICD-10-CM | POA: Diagnosis not present

## 2020-11-15 ENCOUNTER — Ambulatory Visit (INDEPENDENT_AMBULATORY_CARE_PROVIDER_SITE_OTHER): Payer: Medicare Other

## 2020-11-15 DIAGNOSIS — I4891 Unspecified atrial fibrillation: Secondary | ICD-10-CM | POA: Diagnosis not present

## 2020-11-15 LAB — CUP PACEART REMOTE DEVICE CHECK
Date Time Interrogation Session: 20220221025646
Implantable Pulse Generator Implant Date: 20190212

## 2020-11-16 ENCOUNTER — Telehealth: Payer: Self-pay | Admitting: Pharmacist

## 2020-11-16 NOTE — Telephone Encounter (Signed)
Medication list reviewed in anticipation of upcoming Tikosyn initiation. Patient uses Advair and Proair inhalers, both of which are QTc prolonging but not contraindicated, will just need to monitor QTc closely.  Patient is anticoagulated on Eliquis 5mg  BID on the appropriate dose. Would ask if pt needs a refill/clarify who has been filling her rx because our med list shows that she received samples in 2019 as the most recent update on our med list. Please ensure that patient has not missed any anticoagulation doses in the 3 weeks prior to Tikosyn initiation.   Patient will need to be counseled to avoid use of Benadryl while on Tikosyn and in the 2-3 days prior to Tikosyn initiation.

## 2020-11-22 DIAGNOSIS — E1165 Type 2 diabetes mellitus with hyperglycemia: Secondary | ICD-10-CM | POA: Diagnosis not present

## 2020-11-22 DIAGNOSIS — I10 Essential (primary) hypertension: Secondary | ICD-10-CM | POA: Diagnosis not present

## 2020-11-22 DIAGNOSIS — E119 Type 2 diabetes mellitus without complications: Secondary | ICD-10-CM | POA: Diagnosis not present

## 2020-11-22 DIAGNOSIS — E7849 Other hyperlipidemia: Secondary | ICD-10-CM | POA: Diagnosis not present

## 2020-11-22 NOTE — Progress Notes (Signed)
Carelink Summary Report / Loop Recorder 

## 2020-11-26 ENCOUNTER — Telehealth: Payer: Self-pay | Admitting: *Deleted

## 2020-11-26 ENCOUNTER — Encounter: Payer: Self-pay | Admitting: Internal Medicine

## 2020-11-26 ENCOUNTER — Ambulatory Visit (INDEPENDENT_AMBULATORY_CARE_PROVIDER_SITE_OTHER): Payer: Medicare Other | Admitting: Internal Medicine

## 2020-11-26 ENCOUNTER — Encounter: Payer: Self-pay | Admitting: *Deleted

## 2020-11-26 VITALS — BP 144/86 | HR 76 | Ht 63.0 in | Wt 212.8 lb

## 2020-11-26 DIAGNOSIS — I4892 Unspecified atrial flutter: Secondary | ICD-10-CM | POA: Diagnosis not present

## 2020-11-26 DIAGNOSIS — I4819 Other persistent atrial fibrillation: Secondary | ICD-10-CM

## 2020-11-26 DIAGNOSIS — I10 Essential (primary) hypertension: Secondary | ICD-10-CM | POA: Diagnosis not present

## 2020-11-26 DIAGNOSIS — I4891 Unspecified atrial fibrillation: Secondary | ICD-10-CM

## 2020-11-26 DIAGNOSIS — G4733 Obstructive sleep apnea (adult) (pediatric): Secondary | ICD-10-CM

## 2020-11-26 DIAGNOSIS — D6869 Other thrombophilia: Secondary | ICD-10-CM

## 2020-11-26 NOTE — Progress Notes (Signed)
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  Since last being seen in our clinic, the patient reports doing very well.  She has started using BiPAP at night and reports feeling remarkably better.  She does continue to have some SOB which is worse with afib.  She was recently cardioverted but quickly return to AF. Today, she denies symptoms of palpitations, chest pain,  lower extremity edema, dizziness, presyncope, or syncope.  The patient is otherwise without complaint today.   Past Medical History:  Diagnosis Date  . Allergy   . Anemia 07/25/2017  . Anxiety   . Arthritis   . Atrial fibrillation ([place])   . Cataract   . COPD (chronic obstructive pulmonary disease) ([place])   . Depression   . Diabetes mellitus without complication ([place])   . Emphysema of lung ([place])   . GERD (gastroesophageal reflux disease) 10/28/2020  . Hyperlipidemia   . Hypertension   . Oxygen deficiency   . Sleep apnea   . Typical atrial flutter [facility])    Past Surgical History:  Procedure Laterality Date  . A-FLUTTER ABLATION N/A 10/04/2017   Procedure: A-FLUTTER ABLATION;  Surgeon: Thompson Grayer, MD;  Location: [place] CV LAB;  Service: Cardiovascular;  Laterality: N/A;  . ABDOMINAL HYSTERECTOMY     fibroids  . blood clot removal from base of brain  1990  . CARDIOVERSION N/A 10/04/2020   Procedure: CARDIOVERSION;  Surgeon: Freada Bergeron, MD;  Location: [facility] ENDOSCOPY;  Service: Cardiovascular;  Laterality: N/A;  . CARPAL TUNNEL RELEASE    . CATARACT EXTRACTION W/PHACO Left 06/16/2013   Procedure: CATARACT EXTRACTION PHACO AND INTRAOCULAR LENS PLACEMENT (IOC);  Surgeon: Tonny Branch, MD;  Location: [facility] ORS;  Service: Ophthalmology;  Laterality: Left;  CDE:  12.18  . CATARACT EXTRACTION W/PHACO Right 06/26/2013   Procedure: CATARACT EXTRACTION PHACO AND INTRAOCULAR LENS PLACEMENT (IOC);  Surgeon: Tonny Branch, MD;   Location: [facility] ORS;  Service: Ophthalmology;  Laterality: Right;  CDE:14.71  . CESAREAN SECTION    . CHOLECYSTECTOMY    . COLONOSCOPY WITH PROPOFOL N/A 04/08/2019   Dr.Fields: diverticulosis, 11 simple adenomas removed, hemorrhoids. next colonoscopy in 3 years.   Marland Kitchen DILATION AND CURETTAGE OF UTERUS    . ESOPHAGOGASTRODUODENOSCOPY (EGD) WITH PROPOFOL N/A 04/08/2019   Dr. Oneida Alar: H.pylori gastritis  . EYE SURGERY  12/2016  . FOOT NEUROMA SURGERY Right   . LOOP RECORDER INSERTION N/A 11/06/2017   Procedure: LOOP RECORDER INSERTION;  Surgeon: Thompson Grayer, MD;  Location: [place] CV LAB;  Service: Cardiovascular;  Laterality: N/A;  . MOUTH SURGERY    . POLYPECTOMY  04/08/2019   Procedure: POLYPECTOMY;  Surgeon: Danie Binder, MD;  [facility] END[facility];  Service: Endoscopy;;  . TONSILLECTOMY      ROS- all systems are reviewed and negatives except as per HPI above  Current Outpatient Medications  Medication Sig Dispense Refill  . acetaminophen (TYLENOL) 500 MG tablet Take 1,000 mg by mouth every 6 (six) hours as needed for moderate pain or headache.     . ADVAIR DISKUS 250-50 MCG/DOSE AEPB Inhale 1 puff into the lungs 2 (two) times daily.    Marland Kitchen apixaban (ELIQUIS) 5 MG TABS tablet Take 1 tablet (5 mg total) by mouth 2 (two) times daily. 180 tablet 3  . Ascorbic Acid (VITAMIN C) 1000 MG tablet Take 1,000 mg by mouth daily.    Marland Kitchen  calcium-vitamin D (OSCAL WITH D) 500-200 MG-UNIT tablet Take 1 tablet by mouth daily.    . cetirizine (ZYRTEC) 10 MG tablet Take 10 mg by mouth daily.    . Cholecalciferol (VITAMIN D3 SUPER STRENGTH) 50 MCG (2000 UT) TABS Take 2,000 Units by mouth daily at 12 noon.    . DULoxetine (CYMBALTA) 30 MG capsule Take 30 mg by mouth daily.    . ferrous sulfate 325 (65 FE) MG EC tablet Take 325 mg by mouth every other day.    . Melatonin 10 MG TABS Take 10 mg by mouth at bedtime.    . metFORMIN (GLUCOPHAGE) 500 MG tablet Take 1,000 mg by mouth daily.     . metoprolol  succinate (TOPROL-XL) 50 MG 24 hr tablet Take 25 mg in the morning and 25 mg in the evening. Take with or immediately following a meal. Only take the medication if the heart rate is >60bpm. 252 tablet 4  . Multiple Vitamins-Minerals (ONE DAILY CALCIUM/IRON) TABS Take 1 tablet by mouth daily.    . OXYGEN Inhale 2 L into the lungs at bedtime. With BPAP    . pravastatin (PRAVACHOL) 20 MG tablet Take 1 tablet (20 mg total) daily by mouth. 90 tablet 3  . PROAIR HFA 108 (90 Base) MCG/ACT inhaler Inhale 2 puffs into the lungs See admin instructions. Every 4-6 hours as needed for wheezing or sob    . Probiotic Product (PROBIOTIC DAILY PO) Take 1 capsule by mouth daily.     . vitamin B-12 (CYANOCOBALAMIN) 1000 MCG tablet Take 1,000 mcg by mouth daily.     No current facility-administered medications for this visit.    Physical Exam: Vitals:   11/26/20 1147  BP: (!) 144/86  Pulse: 76  Weight: 212 lb 12.8 oz (96.5 kg)  Height: 5\' 3"  (1.6 m)    GEN- The patient is well appearing, alert and oriented x 3 today.   Head- normocephalic, atraumatic Eyes-  Sclera clear, conjunctiva pink Ears- hearing intact Oropharynx- clear Lungs-  normal work of breathing Heart- irregular rate and rhythm  GI- soft  Extremities- no clubbing, cyanosis, or edema  Wt Readings from Last 3 Encounters:  11/26/20 212 lb 12.8 oz (96.5 kg)  10/28/20 212 lb 9.6 oz (96.4 kg)  10/20/20 214 lb 6.4 oz (97.3 kg)    Assessment and Plan:  1. Persistent afib afib had become persistent with burden of 84%.  (Previously 39%) She was cardioverted 10/04/20 but quickly returned to afib. The patient has symptomatic, recurrent atrial fibrillation. she has failed medical therapy with flecainide.  She did not maintain sinus with flecianide but also reports having "weak spells" with collapse while taking flecainide.  These have resolved off of the medicine. Chads2vasc score is 4.  she is anticoagulated with eliquis . Therapeutic  strategies for afib including medicine (tikosyn) and ablation were discussed in detail with the patient today. Risk, benefits, and alternatives to EP study and radiofrequency ablation for afib were also discussed in detail today. These risks include but are not limited to stroke, bleeding, vascular damage, tamponade, perforation, damage to the esophagus, lungs, and other structures, pulmonary vein stenosis, worsening renal function, and death. The patient understands these risk and wishes to proceed.  We will therefore proceed with catheter ablation at the next available time.  Carto, ICE, anesthesia are requested for the procedure.  Will also obtain cardiac CT prior to the procedure to exclude LAA thrombus and further evaluate atrial anatomy.  Echo 2019 reviewed We  will repeat echo at this time.  I have ordered this through our [place] office.  2. HTN Stable No change required today  3 OSA She is using BiPAP and feels soooo much better.  She is very happy with this!  Risks, benefits and potential toxicities for medications prescribed and/or refilled reviewed with patient today.   Thompson Grayer MD, [facility] 11/26/2020 12:12 PM

## 2020-11-26 NOTE — H&P (View-Only) (Signed)
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  Since last being seen in our clinic, the patient reports doing very well.  She has started using BiPAP at night and reports feeling remarkably better.  She does continue to have some SOB which is worse with afib.  She was recently cardioverted but quickly return to AF. Today, she denies symptoms of palpitations, chest pain,  lower extremity edema, dizziness, presyncope, or syncope.  The patient is otherwise without complaint today.   Past Medical History:  Diagnosis Date  . Allergy   . Anemia 07/25/2017  . Anxiety   . Arthritis   . Atrial fibrillation ([place])   . Cataract   . COPD (chronic obstructive pulmonary disease) ([place])   . Depression   . Diabetes mellitus without complication ([place])   . Emphysema of lung ([place])   . GERD (gastroesophageal reflux disease) 10/28/2020  . Hyperlipidemia   . Hypertension   . Oxygen deficiency   . Sleep apnea   . Typical atrial flutter [facility])    Past Surgical History:  Procedure Laterality Date  . A-FLUTTER ABLATION N/A 10/04/2017   Procedure: A-FLUTTER ABLATION;  Surgeon: Thompson Grayer, MD;  Location: [place] CV LAB;  Service: Cardiovascular;  Laterality: N/A;  . ABDOMINAL HYSTERECTOMY     fibroids  . blood clot removal from base of brain  1990  . CARDIOVERSION N/A 10/04/2020   Procedure: CARDIOVERSION;  Surgeon: Freada Bergeron, MD;  Location: [facility] ENDOSCOPY;  Service: Cardiovascular;  Laterality: N/A;  . CARPAL TUNNEL RELEASE    . CATARACT EXTRACTION W/PHACO Left 06/16/2013   Procedure: CATARACT EXTRACTION PHACO AND INTRAOCULAR LENS PLACEMENT (IOC);  Surgeon: Tonny Branch, MD;  Location: [facility] ORS;  Service: Ophthalmology;  Laterality: Left;  CDE:  12.18  . CATARACT EXTRACTION W/PHACO Right 06/26/2013   Procedure: CATARACT EXTRACTION PHACO AND INTRAOCULAR LENS PLACEMENT (IOC);  Surgeon: Tonny Branch, MD;   Location: [facility];  Service: Ophthalmology;  Laterality: Right;  CDE:14.71  . CESAREAN SECTION    . CHOLECYSTECTOMY    . COLONOSCOPY WITH PROPOFOL N/A 04/08/2019   Dr.Fields: diverticulosis, 11 simple adenomas removed, hemorrhoids. next colonoscopy in 3 years.   Marland Kitchen DILATION AND CURETTAGE OF UTERUS    . ESOPHAGOGASTRODUODENOSCOPY (EGD) WITH PROPOFOL N/A 04/08/2019   Dr. Oneida Alar: H.pylori gastritis  . EYE SURGERY  12/2016  . FOOT NEUROMA SURGERY Right   . LOOP RECORDER INSERTION N/A 11/06/2017   Procedure: LOOP RECORDER INSERTION;  Surgeon: Thompson Grayer, MD;  Location: [place] CV LAB;  Service: Cardiovascular;  Laterality: N/A;  . MOUTH SURGERY    . POLYPECTOMY  04/08/2019   Procedure: POLYPECTOMY;  Surgeon: Danie Binder, MD;  [facility];  Service: Endoscopy;;  . TONSILLECTOMY      ROS- all systems are reviewed and negatives except as per HPI above  Current Outpatient Medications  Medication Sig Dispense Refill  . acetaminophen (TYLENOL) 500 MG tablet Take 1,000 mg by mouth every 6 (six) hours as needed for moderate pain or headache.     . ADVAIR DISKUS 250-50 MCG/DOSE AEPB Inhale 1 puff into the lungs 2 (two) times daily.    Marland Kitchen apixaban (ELIQUIS) 5 MG TABS tablet Take 1 tablet (5 mg total) by mouth 2 (two) times daily. 180 tablet 3  . Ascorbic Acid (VITAMIN C) 1000 MG tablet Take 1,000 mg by mouth daily.    Marland Kitchen  calcium-vitamin D (OSCAL WITH D) 500-200 MG-UNIT tablet Take 1 tablet by mouth daily.    . cetirizine (ZYRTEC) 10 MG tablet Take 10 mg by mouth daily.    . Cholecalciferol (VITAMIN D3 SUPER STRENGTH) 50 MCG (2000 UT) TABS Take 2,000 Units by mouth daily at 12 noon.    . DULoxetine (CYMBALTA) 30 MG capsule Take 30 mg by mouth daily.    . ferrous sulfate 325 (65 FE) MG EC tablet Take 325 mg by mouth every other day.    . Melatonin 10 MG TABS Take 10 mg by mouth at bedtime.    . metFORMIN (GLUCOPHAGE) 500 MG tablet Take 1,000 mg by mouth daily.     . metoprolol  succinate (TOPROL-XL) 50 MG 24 hr tablet Take 25 mg in the morning and 25 mg in the evening. Take with or immediately following a meal. Only take the medication if the heart rate is >60bpm. 252 tablet 4  . Multiple Vitamins-Minerals (ONE DAILY CALCIUM/IRON) TABS Take 1 tablet by mouth daily.    . OXYGEN Inhale 2 L into the lungs at bedtime. With BPAP    . pravastatin (PRAVACHOL) 20 MG tablet Take 1 tablet (20 mg total) daily by mouth. 90 tablet 3  . PROAIR HFA 108 (90 Base) MCG/ACT inhaler Inhale 2 puffs into the lungs See admin instructions. Every 4-6 hours as needed for wheezing or sob    . Probiotic Product (PROBIOTIC DAILY PO) Take 1 capsule by mouth daily.     . vitamin B-12 (CYANOCOBALAMIN) 1000 MCG tablet Take 1,000 mcg by mouth daily.     No current facility-administered medications for this visit.    Physical Exam: Vitals:   11/26/20 1147  BP: (!) 144/86  Pulse: 76  Weight: 212 lb 12.8 oz (96.5 kg)  Height: 5\' 3"  (1.6 m)    GEN- The patient is well appearing, alert and oriented x 3 today.   Head- normocephalic, atraumatic Eyes-  Sclera clear, conjunctiva pink Ears- hearing intact Oropharynx- clear Lungs-  normal work of breathing Heart- irregular rate and rhythm  GI- soft  Extremities- no clubbing, cyanosis, or edema  Wt Readings from Last 3 Encounters:  11/26/20 212 lb 12.8 oz (96.5 kg)  10/28/20 212 lb 9.6 oz (96.4 kg)  10/20/20 214 lb 6.4 oz (97.3 kg)    Assessment and Plan:  1. Persistent afib afib had become persistent with burden of 84%.  (Previously 39%) She was cardioverted 10/04/20 but quickly returned to afib. The patient has symptomatic, recurrent atrial fibrillation. she has failed medical therapy with flecainide.  She did not maintain sinus with flecianide but also reports having "weak spells" with collapse while taking flecainide.  These have resolved off of the medicine. Chads2vasc score is 4.  she is anticoagulated with eliquis . Therapeutic  strategies for afib including medicine (tikosyn) and ablation were discussed in detail with the patient today. Risk, benefits, and alternatives to EP study and radiofrequency ablation for afib were also discussed in detail today. These risks include but are not limited to stroke, bleeding, vascular damage, tamponade, perforation, damage to the esophagus, lungs, and other structures, pulmonary vein stenosis, worsening renal function, and death. The patient understands these risk and wishes to proceed.  We will therefore proceed with catheter ablation at the next available time.  Carto, ICE, anesthesia are requested for the procedure.  Will also obtain cardiac CT prior to the procedure to exclude LAA thrombus and further evaluate atrial anatomy.  Echo 2019 reviewed We  will repeat echo at this time.  I have ordered this through our [place] office.  2. HTN Stable No change required today  3 OSA She is using BiPAP and feels soooo much better.  She is very happy with this!  Risks, benefits and potential toxicities for medications prescribed and/or refilled reviewed with patient today.   Thompson Grayer MD, [facility] 11/26/2020 12:12 PM

## 2020-11-26 NOTE — Patient Instructions (Addendum)
Medication Instructions:  Continue all current medications.  Labwork: none  Testing/Procedures:  Your physician has requested that you have an echocardiogram. Echocardiography is a painless test that uses sound waves to create images of your heart. It provides your doctor with information about the size and shape of your heart and how well your heart's chambers and valves are working. This procedure takes approximately one hour. There are no restrictions for this procedure.  AFib ablation - Otila Kluver will call patient.   Follow-Up: Pending   Any Other Special Instructions Will Be Listed Below (If Applicable).  If you need a refill on your cardiac medications before your next appointment, please call your pharmacy.

## 2020-11-26 NOTE — Telephone Encounter (Signed)
-----   Message from Thompson Grayer, MD sent at 11/26/2020 12:29 PM EST ----- Please schedule afib ablation C/I/A Cardiac CT  Thanks!

## 2020-11-29 NOTE — Telephone Encounter (Signed)
Outreach made to Pt.  Pt is scheduled for afib ablation on December 09, 2020   Labs scheduled for March 8-will plan to meet with Pt to go over instruction letters  Covid test scheduled.  Message sent to [facility] triage to attempt to reschedule ECHO for prior to ablation.  Message sent to precert/ct scheduler   Work up complete.

## 2020-11-30 ENCOUNTER — Other Ambulatory Visit: Payer: Self-pay

## 2020-11-30 ENCOUNTER — Other Ambulatory Visit: Payer: Medicare Other | Admitting: *Deleted

## 2020-11-30 ENCOUNTER — Telehealth: Payer: Self-pay

## 2020-11-30 ENCOUNTER — Telehealth: Payer: Self-pay | Admitting: Internal Medicine

## 2020-11-30 DIAGNOSIS — I4891 Unspecified atrial fibrillation: Secondary | ICD-10-CM

## 2020-11-30 LAB — BASIC METABOLIC PANEL
BUN/Creatinine Ratio: 20 (ref 12–28)
BUN: 16 mg/dL (ref 8–27)
CO2: 28 mmol/L (ref 20–29)
Calcium: 10.3 mg/dL (ref 8.7–10.3)
Chloride: 102 mmol/L (ref 96–106)
Creatinine, Ser: 0.82 mg/dL (ref 0.57–1.00)
Glucose: 123 mg/dL — ABNORMAL HIGH (ref 65–99)
Potassium: 3.7 mmol/L (ref 3.5–5.2)
Sodium: 143 mmol/L (ref 134–144)
eGFR: 75 mL/min/{1.73_m2} (ref >59)

## 2020-11-30 LAB — CBC WITH DIFFERENTIAL/PLATELET
Basophils Absolute: 0 10*3/uL (ref 0.0–0.2)
Basos: 0 %
EOS (ABSOLUTE): 0.1 10*3/uL (ref 0.0–0.4)
Eos: 2 %
Hematocrit: 41.5 % (ref 34.0–46.6)
Hemoglobin: 13.6 g/dL (ref 11.1–15.9)
Immature Grans (Abs): 0 10*3/uL (ref 0.0–0.1)
Immature Granulocytes: 0 %
Lymphocytes Absolute: 1 10*3/uL (ref 0.7–3.1)
Lymphs: 17 %
MCH: 32.5 pg (ref 26.6–33.0)
MCHC: 32.8 g/dL (ref 31.5–35.7)
MCV: 99 fL — ABNORMAL HIGH (ref 79–97)
Monocytes Absolute: 0.4 10*3/uL (ref 0.1–0.9)
Monocytes: 7 %
Neutrophils Absolute: 4.4 10*3/uL (ref 1.4–7.0)
Neutrophils: 74 %
Platelets: 129 10*3/uL — ABNORMAL LOW (ref 150–450)
RBC: 4.19 x10E6/uL (ref 3.77–5.28)
RDW: 13.3 % (ref 11.7–15.4)
WBC: 6 10*3/uL (ref 3.4–10.8)

## 2020-11-30 NOTE — Telephone Encounter (Signed)
The patient is in today for blood work and asked to speak with Dr. Jackalyn Lombard nurse who is not in today. Will forward to [facility] for follow-up.

## 2020-11-30 NOTE — Telephone Encounter (Signed)
Pre-cert Verification for the following procedure    ECHO   DATE:   12/01/2020  LOCATION: [facility]

## 2020-12-01 ENCOUNTER — Telehealth (HOSPITAL_COMMUNITY): Payer: Self-pay | Admitting: *Deleted

## 2020-12-01 ENCOUNTER — Ambulatory Visit (HOSPITAL_COMMUNITY)
Admission: RE | Admit: 2020-12-01 | Discharge: 2020-12-01 | Disposition: A | Discharge status: Home / Self Care | Payer: Medicare Other | Source: Ambulatory Visit | Attending: Internal Medicine | Admitting: Internal Medicine

## 2020-12-01 DIAGNOSIS — I4892 Unspecified atrial flutter: Secondary | ICD-10-CM | POA: Insufficient documentation

## 2020-12-01 NOTE — Progress Notes (Signed)
*  PRELIMINARY RESULTS* Echocardiogram 2D Echocardiogram has been performed.  Linda [name] 12/01/2020, 12:36 PM

## 2020-12-01 NOTE — Telephone Encounter (Signed)
Reaching out to patient to offer assistance regarding upcoming cardiac imaging study; pt verbalizes understanding of appt date/time, parking situation and where to check in, pre-test NPO status, and verified current allergies; name and call back number provided for further questions should they arise  Merle Prescott RN Navigator Cardiac Imaging [facility] [phone] office [phone] cell  

## 2020-12-02 NOTE — Telephone Encounter (Signed)
Answered all questions about CT tomorrow and Afib ablation coming up.  Patient verbalized understanding.

## 2020-12-03 ENCOUNTER — Ambulatory Visit (HOSPITAL_COMMUNITY)
Admission: RE | Admit: 2020-12-03 | Discharge: 2020-12-03 | Disposition: A | Discharge status: Home / Self Care | Payer: Medicare Other | Source: Ambulatory Visit | Attending: Internal Medicine | Admitting: Internal Medicine

## 2020-12-03 ENCOUNTER — Other Ambulatory Visit: Payer: Self-pay

## 2020-12-03 ENCOUNTER — Encounter (HOSPITAL_COMMUNITY): Payer: Self-pay

## 2020-12-03 DIAGNOSIS — I4891 Unspecified atrial fibrillation: Secondary | ICD-10-CM | POA: Insufficient documentation

## 2020-12-03 MED ORDER — METOPROLOL TARTRATE 5 MG/5ML IV SOLN: 5.0000 mg | INTRAVENOUS | Status: DC | PRN

## 2020-12-03 MED ORDER — IOHEXOL 350 MG/ML SOLN
80.0000 mL | Freq: Once | INTRAVENOUS | Status: AC | PRN
  Administered 2020-12-03: 80 mL via INTRAVENOUS

## 2020-12-03 MED ORDER — METOPROLOL TARTRATE 5 MG/5ML IV SOLN
INTRAVENOUS | Status: AC
  Administered 2020-12-03: 5 mg via INTRAVENOUS
  Filled 2020-12-03: qty 10

## 2020-12-07 ENCOUNTER — Telehealth: Payer: Self-pay | Admitting: Internal Medicine

## 2020-12-07 ENCOUNTER — Other Ambulatory Visit (HOSPITAL_COMMUNITY)
Admission: RE | Admit: 2020-12-07 | Discharge: 2020-12-07 | Disposition: A | Discharge status: Home / Self Care | Payer: Medicare Other | Source: Ambulatory Visit | Attending: Cardiology | Admitting: Cardiology

## 2020-12-07 DIAGNOSIS — Z01812 Encounter for preprocedural laboratory examination: Secondary | ICD-10-CM | POA: Diagnosis not present

## 2020-12-07 DIAGNOSIS — Z20822 Contact with and (suspected) exposure to covid-19: Secondary | ICD-10-CM | POA: Insufficient documentation

## 2020-12-07 LAB — SARS CORONAVIRUS 2 (TAT 6-24 HRS): SARS Coronavirus 2: NEGATIVE

## 2020-12-07 NOTE — Telephone Encounter (Signed)
Patient had further questions about TEE pre Ablation.  Answered all questions.

## 2020-12-07 NOTE — Telephone Encounter (Signed)
New message:     Patient calling to ask to speak with Linda [name] or Linda [name]. Patient is stating that the nurse Otila Kluver called her. Patient would like for some one to call her.

## 2020-12-08 NOTE — Pre-Procedure Instructions (Signed)
Instructed patient on the following items: Arrival time 0700 Nothing to eat or drink after midnight No meds AM of procedure Responsible person to drive you home and stay with you for 24 hrs  Have you missed any doses of anti-coagulant Eliquis- hasn't missed any doses

## 2020-12-09 ENCOUNTER — Other Ambulatory Visit: Payer: Self-pay

## 2020-12-09 ENCOUNTER — Ambulatory Visit (HOSPITAL_COMMUNITY): Admission: RE | Admit: 2020-12-09 | Payer: Medicare Other | Source: Ambulatory Visit | Admitting: Internal Medicine

## 2020-12-09 ENCOUNTER — Ambulatory Visit (HOSPITAL_BASED_OUTPATIENT_CLINIC_OR_DEPARTMENT_OTHER)
Admission: RE | Admit: 2020-12-09 | Discharge: 2020-12-09 | Disposition: A | Discharge status: Home / Self Care | Payer: Medicare Other | Source: Ambulatory Visit | Attending: Cardiology | Admitting: Cardiology

## 2020-12-09 ENCOUNTER — Ambulatory Visit (HOSPITAL_COMMUNITY): Payer: Medicare Other | Admitting: Anesthesiology

## 2020-12-09 ENCOUNTER — Ambulatory Visit (HOSPITAL_COMMUNITY)
Admission: RE | Admit: 2020-12-09 | Discharge: 2020-12-09 | Disposition: A | Discharge status: Home / Self Care | Payer: Medicare Other | Source: Ambulatory Visit | Attending: Cardiology | Admitting: Cardiology

## 2020-12-09 ENCOUNTER — Encounter (HOSPITAL_COMMUNITY)
Admission: RE | Disposition: A | Discharge status: Home / Self Care | Payer: Self-pay | Source: Ambulatory Visit | Attending: Cardiology

## 2020-12-09 ENCOUNTER — Encounter (HOSPITAL_COMMUNITY): Payer: Self-pay | Admitting: Cardiology

## 2020-12-09 DIAGNOSIS — I351 Nonrheumatic aortic (valve) insufficiency: Secondary | ICD-10-CM

## 2020-12-09 DIAGNOSIS — E785 Hyperlipidemia, unspecified: Secondary | ICD-10-CM | POA: Diagnosis not present

## 2020-12-09 DIAGNOSIS — Z79899 Other long term (current) drug therapy: Secondary | ICD-10-CM | POA: Diagnosis not present

## 2020-12-09 DIAGNOSIS — I4819 Other persistent atrial fibrillation: Secondary | ICD-10-CM | POA: Insufficient documentation

## 2020-12-09 DIAGNOSIS — I080 Rheumatic disorders of both mitral and aortic valves: Secondary | ICD-10-CM | POA: Insufficient documentation

## 2020-12-09 DIAGNOSIS — Z7901 Long term (current) use of anticoagulants: Secondary | ICD-10-CM | POA: Insufficient documentation

## 2020-12-09 DIAGNOSIS — I10 Essential (primary) hypertension: Secondary | ICD-10-CM | POA: Insufficient documentation

## 2020-12-09 DIAGNOSIS — D509 Iron deficiency anemia, unspecified: Secondary | ICD-10-CM | POA: Diagnosis not present

## 2020-12-09 DIAGNOSIS — I083 Combined rheumatic disorders of mitral, aortic and tricuspid valves: Secondary | ICD-10-CM | POA: Diagnosis not present

## 2020-12-09 DIAGNOSIS — I700 Atherosclerosis of aorta: Secondary | ICD-10-CM | POA: Diagnosis not present

## 2020-12-09 DIAGNOSIS — K219 Gastro-esophageal reflux disease without esophagitis: Secondary | ICD-10-CM | POA: Diagnosis not present

## 2020-12-09 DIAGNOSIS — I340 Nonrheumatic mitral (valve) insufficiency: Secondary | ICD-10-CM

## 2020-12-09 DIAGNOSIS — I2510 Atherosclerotic heart disease of native coronary artery without angina pectoris: Secondary | ICD-10-CM | POA: Diagnosis not present

## 2020-12-09 DIAGNOSIS — G4733 Obstructive sleep apnea (adult) (pediatric): Secondary | ICD-10-CM | POA: Insufficient documentation

## 2020-12-09 DIAGNOSIS — I4891 Unspecified atrial fibrillation: Secondary | ICD-10-CM | POA: Diagnosis not present

## 2020-12-09 DIAGNOSIS — Z7984 Long term (current) use of oral hypoglycemic drugs: Secondary | ICD-10-CM | POA: Insufficient documentation

## 2020-12-09 DIAGNOSIS — E119 Type 2 diabetes mellitus without complications: Secondary | ICD-10-CM | POA: Insufficient documentation

## 2020-12-09 HISTORY — PX: ATRIAL FIBRILLATION ABLATION: EP1191

## 2020-12-09 HISTORY — PX: TEE WITHOUT CARDIOVERSION: SHX5443

## 2020-12-09 LAB — GLUCOSE, CAPILLARY
Glucose-Capillary: 114 mg/dL — ABNORMAL HIGH (ref 70–99)
Glucose-Capillary: 103 mg/dL — ABNORMAL HIGH (ref 70–99)

## 2020-12-09 LAB — POCT ACTIVATED CLOTTING TIME
Activated Clotting Time: 350 seconds
Activated Clotting Time: 339 seconds

## 2020-12-09 SURGERY — ATRIAL FIBRILLATION ABLATION
Anesthesia: General

## 2020-12-09 SURGERY — ECHOCARDIOGRAM, TRANSESOPHAGEAL
Anesthesia: Monitor Anesthesia Care

## 2020-12-09 MED ORDER — ACETAMINOPHEN 325 MG PO TABS: 650.0000 mg | ORAL_TABLET | ORAL | Status: DC | PRN

## 2020-12-09 MED ORDER — HEPARIN (PORCINE) IN NACL 1000-0.9 UT/500ML-% IV SOLN
INTRAVENOUS | Status: DC | PRN
  Administered 2020-12-09: 500 mL

## 2020-12-09 MED ORDER — PHENYLEPHRINE HCL-NACL 10-0.9 MG/250ML-% IV SOLN
INTRAVENOUS | Status: DC | PRN
  Administered 2020-12-09: 25 ug/min via INTRAVENOUS

## 2020-12-09 MED ORDER — DEXAMETHASONE SODIUM PHOSPHATE 4 MG/ML IJ SOLN
INTRAMUSCULAR | Status: DC | PRN
  Administered 2020-12-09: 4 mg via INTRAVENOUS

## 2020-12-09 MED ORDER — FENTANYL CITRATE (PF) 100 MCG/2ML IJ SOLN
INTRAMUSCULAR | Status: DC | PRN
  Administered 2020-12-09: 50 ug via INTRAVENOUS

## 2020-12-09 MED ORDER — ONDANSETRON HCL 4 MG/2ML IJ SOLN
INTRAMUSCULAR | Status: DC | PRN
  Administered 2020-12-09: 4 mg via INTRAVENOUS

## 2020-12-09 MED ORDER — APIXABAN 5 MG PO TABS
5.0000 mg | ORAL_TABLET | ORAL | Status: AC
  Administered 2020-12-09: 5 mg via ORAL
  Filled 2020-12-09: qty 1

## 2020-12-09 MED ORDER — HYDROCODONE-ACETAMINOPHEN 5-325 MG PO TABS: 1.0000 | ORAL_TABLET | ORAL | Status: DC | PRN

## 2020-12-09 MED ORDER — PROPOFOL 10 MG/ML IV BOLUS
INTRAVENOUS | Status: DC | PRN
  Administered 2020-12-09: 10 mg via INTRAVENOUS
  Administered 2020-12-09: 50 mg via INTRAVENOUS

## 2020-12-09 MED ORDER — SODIUM CHLORIDE 0.9 % IV SOLN: INTRAVENOUS | Status: DC

## 2020-12-09 MED ORDER — PROTAMINE SULFATE 10 MG/ML IV SOLN
INTRAVENOUS | Status: DC | PRN
  Administered 2020-12-09: 40 mg via INTRAVENOUS

## 2020-12-09 MED ORDER — PROPOFOL 500 MG/50ML IV EMUL
INTRAVENOUS | Status: DC | PRN
  Administered 2020-12-09: 100 ug/kg/min via INTRAVENOUS

## 2020-12-09 MED ORDER — SUGAMMADEX SODIUM 200 MG/2ML IV SOLN
INTRAVENOUS | Status: DC | PRN
  Administered 2020-12-09: 200 mg via INTRAVENOUS

## 2020-12-09 MED ORDER — SODIUM CHLORIDE 0.9% FLUSH: 3.0000 mL | INTRAVENOUS | Status: DC | PRN

## 2020-12-09 MED ORDER — PANTOPRAZOLE SODIUM 40 MG PO TBEC: 40.0000 mg | DELAYED_RELEASE_TABLET | Freq: Every day | ORAL | 0 refills | Status: DC

## 2020-12-09 MED ORDER — LIDOCAINE 2% (20 MG/ML) 5 ML SYRINGE
INTRAMUSCULAR | Status: DC | PRN
  Administered 2020-12-09: 80 mg via INTRAVENOUS

## 2020-12-09 MED ORDER — SODIUM CHLORIDE 0.9% FLUSH: 3.0000 mL | Freq: Two times a day (BID) | INTRAVENOUS | Status: DC

## 2020-12-09 MED ORDER — HEPARIN SODIUM (PORCINE) 1000 UNIT/ML IJ SOLN
INTRAMUSCULAR | Status: DC | PRN
  Administered 2020-12-09: 15000 [IU] via INTRAVENOUS
  Administered 2020-12-09: 1000 [IU] via INTRAVENOUS

## 2020-12-09 MED ORDER — LIDOCAINE 2% (20 MG/ML) 5 ML SYRINGE
INTRAMUSCULAR | Status: DC | PRN
  Administered 2020-12-09: 100 mg via INTRAVENOUS

## 2020-12-09 MED ORDER — PROPOFOL 10 MG/ML IV BOLUS
INTRAVENOUS | Status: DC | PRN
  Administered 2020-12-09: 150 mg via INTRAVENOUS

## 2020-12-09 MED ORDER — ROCURONIUM BROMIDE 100 MG/10ML IV SOLN
INTRAVENOUS | Status: DC | PRN
  Administered 2020-12-09: 60 mg via INTRAVENOUS

## 2020-12-09 MED ORDER — HEPARIN SODIUM (PORCINE) 1000 UNIT/ML IJ SOLN
INTRAMUSCULAR | Status: DC | PRN
  Administered 2020-12-09: 1000 [IU] via INTRAVENOUS

## 2020-12-09 MED ORDER — ONDANSETRON HCL 4 MG/2ML IJ SOLN: 4.0000 mg | Freq: Four times a day (QID) | INTRAMUSCULAR | Status: DC | PRN

## 2020-12-09 MED ORDER — SODIUM CHLORIDE 0.9 % IV SOLN: 250.0000 mL | INTRAVENOUS | Status: DC | PRN

## 2020-12-09 SURGICAL SUPPLY — 20 items
BLANKET WARM UNDERBOD FULL ACC (MISCELLANEOUS) ×3 IMPLANT
CATH MAPPNG PENTARAY F 2-6-2MM (CATHETERS) IMPLANT
CATH SMTCH THERMOCOOL SF DF (CATHETERS) ×2 IMPLANT
CATH SOUNDSTAR ECO 8FR (CATHETERS) ×2 IMPLANT
CATH WEBSTER BI DIR CS D-F CRV (CATHETERS) ×2 IMPLANT
CLOSURE PERCLOSE PROSTYLE (VASCULAR PRODUCTS) ×6 IMPLANT
COVER SWIFTLINK CONNECTOR (BAG) ×3 IMPLANT
MAT PREVALON FULL STRYKER (MISCELLANEOUS) ×2 IMPLANT
NDL BAYLIS TRANSSEPTAL 71CM (NEEDLE) IMPLANT
NEEDLE BAYLIS TRANSSEPTAL 71CM (NEEDLE) ×3 IMPLANT
PACK EP LATEX FREE (CUSTOM PROCEDURE TRAY) ×3
PACK EP LF (CUSTOM PROCEDURE TRAY) ×1 IMPLANT
PAD PRO RADIOLUCENT 2001M-C (PAD) ×3 IMPLANT
PATCH CARTO3 (PAD) ×2 IMPLANT
PENTARAY F 2-6-2MM (CATHETERS) ×3
SHEATH PINNACLE 7F 10CM (SHEATH) ×4 IMPLANT
SHEATH PINNACLE 9F 10CM (SHEATH) ×2 IMPLANT
SHEATH PROBE COVER 6X72 (BAG) ×2 IMPLANT
SHEATH SWARTZ TS SL2 63CM 8.5F (SHEATH) ×2 IMPLANT
TUBING SMART ABLATE COOLFLOW (TUBING) ×2 IMPLANT

## 2020-12-09 NOTE — Anesthesia Procedure Notes (Signed)
Procedure Name: Intubation Date/Time: 12/09/2020 11:10 AM Performed by: Lieutenant Diego, CRNA Pre-anesthesia Checklist: Patient identified, Emergency Drugs available, Suction available and Patient being monitored Patient Re-evaluated:Patient Re-evaluated prior to induction Oxygen Delivery Method: Circle system utilized Preoxygenation: Pre-oxygenation with 100% oxygen Induction Type: IV induction Ventilation: Mask ventilation without difficulty Laryngoscope Size: Miller and 2 Grade View: Grade I Tube type: Oral Tube size: 7.0 mm Number of attempts: 1 Airway Equipment and Method: Stylet Placement Confirmation: ETT inserted through vocal cords under direct vision,  positive ETCO2 and breath sounds checked- equal and bilateral Secured at: 21 cm Tube secured with: Tape Dental Injury: Teeth and Oropharynx as per pre-operative assessment

## 2020-12-09 NOTE — Progress Notes (Signed)
  Echocardiogram Echocardiogram Transesophageal has been performed.  Linda [name] 12/09/2020, 8:38 AM

## 2020-12-09 NOTE — Anesthesia Preprocedure Evaluation (Addendum)
Anesthesia Evaluation  Patient identified by MRN, date of birth, ID band Patient awake    Reviewed: Allergy & Precautions, NPO status , Patient's Chart, lab work & pertinent test results  History of Anesthesia Complications Negative for: history of anesthetic complications  Airway Mallampati: II  TM Distance: >3 FB Neck ROM: Full    Dental  (+) Dental Advisory Given, Partial Upper   Pulmonary sleep apnea, Continuous Positive Airway Pressure Ventilation and Oxygen sleep apnea , COPD,  COPD inhaler, former smoker,    Pulmonary exam normal        Cardiovascular hypertension, Pt. on home beta blockers and Pt. on medications + CAD  + dysrhythmias Atrial Fibrillation  Rhythm:Irregular Rate:Normal   '22 TTE - EF 55%, mild LVH, mild MR    Neuro/Psych PSYCHIATRIC DISORDERS Anxiety Depression negative neurological ROS     GI/Hepatic Neg liver ROS, GERD  ,  Endo/Other  diabetes, Type 2, Oral Hypoglycemic Agents Obesity   Renal/GU negative Renal ROS     Musculoskeletal  (+) Arthritis ,   Abdominal   Peds  Hematology  Thrombocytopenia, Plt 129k    Anesthesia Other Findings Covid test negative   Reproductive/Obstetrics                            Anesthesia Physical Anesthesia Plan  ASA: IV  Anesthesia Plan: MAC   Post-op Pain Management:    Induction: Intravenous  PONV Risk Score and Plan: 2 and Propofol infusion and Treatment may vary due to age or medical condition  Airway Management Planned: Nasal Cannula and Natural Airway  Additional Equipment:   Intra-op Plan:   Post-operative Plan:   Informed Consent: I have reviewed the patients History and Physical, chart, labs and discussed the procedure including the risks, benefits and alternatives for the proposed anesthesia with the patient or authorized representative who has indicated his/her understanding and acceptance.        Plan Discussed with: CRNA and Anesthesiologist  Anesthesia Plan Comments:        Anesthesia Quick Evaluation

## 2020-12-09 NOTE — Transfer of Care (Signed)
Immediate Anesthesia Transfer of Care Note  Patient: Linda [name]  Procedure(s) Performed: ATRIAL FIBRILLATION ABLATION (N/A )  Patient Location: Cath Lab  Anesthesia Type:General  Level of Consciousness: awake  Airway & Oxygen Therapy: Patient Spontanous Breathing and Patient connected to nasal cannula oxygen  Post-op Assessment: Report given to RN and Post -op Vital signs reviewed and stable  Post vital signs: Reviewed and stable  Last Vitals:  Vitals Value Taken Time  BP 144/112 12/09/20 1313  Temp    Pulse 72 12/09/20 1314  Resp 14 12/09/20 1314  SpO2 100 % 12/09/20 1314  Vitals shown include unvalidated device data.  Last Pain:  Vitals:   12/09/20 0849  TempSrc:   PainSc: 0-No pain         Complications: No complications documented.

## 2020-12-09 NOTE — Anesthesia Postprocedure Evaluation (Signed)
Anesthesia Post Note  Patient: Linda [name]  Procedure(s) Performed: ATRIAL FIBRILLATION ABLATION (N/A )     Patient location during evaluation: Cath Lab Anesthesia Type: General Level of consciousness: awake and alert Pain management: pain level controlled Vital Signs Assessment: post-procedure vital signs reviewed and stable Respiratory status: spontaneous breathing, nonlabored ventilation, respiratory function stable and patient connected to nasal cannula oxygen Cardiovascular status: blood pressure returned to baseline and stable Postop Assessment: no apparent nausea or vomiting Anesthetic complications: no   No complications documented.  Last Vitals:  Vitals:   12/09/20 1556 12/09/20 1626  BP: (!) 143/80 (!) 160/81  Pulse: 83 80  Resp: (!) 22 19  Temp:    SpO2: 95% 95%    Last Pain:  Vitals:   12/09/20 1416  TempSrc:   PainSc: 0-No pain                 Catalina Gravel

## 2020-12-09 NOTE — Anesthesia Preprocedure Evaluation (Signed)
Anesthesia Evaluation  Patient identified by MRN, date of birth, ID band Patient awake    Reviewed: Allergy & Precautions, NPO status , Patient's Chart, lab work & pertinent test results, reviewed documented beta blocker date and time   Airway Mallampati: II  TM Distance: >3 FB Neck ROM: Full    Dental  (+) Dental Advisory Given, Upper Dentures   Pulmonary sleep apnea and Continuous Positive Airway Pressure Ventilation , COPD, former smoker,    Pulmonary exam normal breath sounds clear to auscultation       Cardiovascular hypertension, Pt. on home beta blockers + CAD  + dysrhythmias Atrial Fibrillation  Rhythm:Irregular Rate:Abnormal     Neuro/Psych PSYCHIATRIC DISORDERS Anxiety Depression negative neurological ROS     GI/Hepatic Neg liver ROS, GERD  ,  Endo/Other  diabetes, Type 2, Oral Hypoglycemic AgentsObesity   Renal/GU negative Renal ROS     Musculoskeletal  (+) Arthritis ,   Abdominal   Peds  Hematology  (+) Blood dyscrasia (Thrombocytopenia; Eliquis), ,   Anesthesia Other Findings Day of surgery medications reviewed with the patient.  Reproductive/Obstetrics                             Anesthesia Physical Anesthesia Plan  ASA: III  Anesthesia Plan: General   Post-op Pain Management:    Induction: Intravenous  PONV Risk Score and Plan: 3 and Ondansetron and Dexamethasone  Airway Management Planned: Oral ETT  Additional Equipment:   Intra-op Plan:   Post-operative Plan: Extubation in OR  Informed Consent: I have reviewed the patients History and Physical, chart, labs and discussed the procedure including the risks, benefits and alternatives for the proposed anesthesia with the patient or authorized representative who has indicated his/her understanding and acceptance.     Dental advisory given  Plan Discussed with: CRNA  Anesthesia Plan Comments:          Anesthesia Quick Evaluation

## 2020-12-09 NOTE — Progress Notes (Signed)
    Transesophageal Echocardiogram Note  Linda [name] [id number] 1945/09/29  Procedure: Transesophageal Echocardiogram Indications: Atrial fibrillation  Procedure Details Consent: Obtained Time Out: Verified patient identification, verified procedure, site/side was marked, verified correct patient position, special equipment/implants available, Radiology Safety Procedures followed,  medications/allergies/relevent history reviewed, required imaging and test results available.  Performed  Medications:  Pt sedated by anesthesia with lidocaine 100 mg and diprovan 145 mg IV total.  Normal LV function; moderate LAE; spontaneous contrast in LAA but no thrombus; mild AI; mild to moderate MR; mild TR.   Complications: No apparent complications Patient did tolerate procedure well.  Kirk Ruths, MD

## 2020-12-09 NOTE — Anesthesia Postprocedure Evaluation (Signed)
Anesthesia Post Note  Patient: Linda [name]  Procedure(s) Performed: TRANSESOPHAGEAL ECHOCARDIOGRAM (TEE) (N/A )     Patient location during evaluation: PACU Anesthesia Type: MAC Level of consciousness: awake and alert Pain management: pain level controlled Vital Signs Assessment: post-procedure vital signs reviewed and stable Respiratory status: spontaneous breathing, nonlabored ventilation and respiratory function stable Cardiovascular status: stable and blood pressure returned to baseline Anesthetic complications: no   No complications documented.  Last Vitals:  Vitals:   12/09/20 0836 12/09/20 0849  BP: (!) 159/95 (!) 168/99  Pulse: 83 95  Resp: 17 16  Temp:    SpO2: 98% 93%    Last Pain:  Vitals:   12/09/20 0849  TempSrc:   PainSc: 0-No pain                 Audry Pili

## 2020-12-09 NOTE — Discharge Instructions (Signed)
Post procedure care instructions No driving for 4 days. No lifting over 5 lbs for 1 week. No vigorous or sexual activity for 1 week. You may return to work/your usual activities on 12/17/20. Keep procedure site clean & dry. If you notice increased pain, swelling, bleeding or pus, call/return!  You may shower after 24 hours, but no soaking in baths/hot tubs/pools for 1 week.    You have an appointment set up with the At[facility] Fibrillation Clinic.  Multiple studies have shown that being followed by a dedicated atrial fibrillation clinic in addition to the standard care you receive from your other physicians improves health. We believe that enrollment in the atrial fibrillation clinic will allow us to better care for you.   The phone number to the Atrial Fibrillation Clinic is [phone]. The clinic is staffed Monday through Friday from 8:30am to 5pm.  Parking Directions: The clinic is located in the Heart and Vascular Building connected to [facility]. 1)From [address] turn on to [address] and go to the 3rd entrance  (Heart and Vascular entrance) on the right. 2)Look to the right for Heart &Vascular Parking Garage. 3)A code for the entrance is required, for April is 2231.   4)Take the elevators to the 1st floor. Registration is in the room with the glass walls at the end of the hallway.  If you have any trouble parking or locating the clinic, please don't hesitate to call [phone].  Cardiac Ablation, Care After  This sheet gives you information about how to care for yourself after your procedure. Your health care provider may also give you more specific instructions. If you have problems or questions, contact your health care provider. What can I expect after the procedure? After the procedure, it is common to have:  Bruising around your puncture site.  Tenderness around your puncture site.  Skipped heartbeats.  Tiredness (fatigue).  Follow these instructions at  home: Puncture site care   Follow instructions from your health care provider about how to take care of your puncture site. Make sure you: ? If present, leave stitches (sutures), skin glue, or adhesive strips in place. These skin closures may need to stay in place for up to 2 weeks. If adhesive strip edges start to loosen and curl up, you may trim the loose edges. Do not remove adhesive strips completely unless your health care provider tells you to do that. ? If a large square bandage is present, this may be removed 24 hours after surgery.   Check your puncture site every day for signs of infection. Check for: ? Redness, swelling, or pain. ? Fluid or blood. If your puncture site starts to bleed, lie down on your back, apply firm pressure to the area, and contact your health care provider. ? Warmth. ? Pus or a bad smell. Driving  Do not drive for at least 4 days after your procedure or however long your health care provider recommends. (Do not resume driving if you have previously been instructed not to drive for other health reasons.)  Do not drive or use heavy machinery while taking prescription pain medicine. Activity  Avoid activities that take a lot of effort for at least 7 days after your procedure.  Do not lift anything that is heavier than 5 lb (4.5 kg) for one week.   No sexual activity for 1 week.   Return to your normal activities as told by your health care provider. Ask your health care provider what activities are   you. General instructions  Take over-the-counter and prescription medicines only as told by your health care provider.  Do not use any products that contain nicotine or tobacco, such as cigarettes and e-cigarettes. If you need help quitting, ask your health care provider.  You may shower after 24 hours, but Do not take baths, swim, or use a hot tub for 1 week.   Do not drink alcohol for 24 hours after your procedure.  Keep all follow-up visits as  told by your health care provider. This is important. Contact a health care provider if:  You have redness, mild swelling, or pain around your puncture site.  You have fluid or blood coming from your puncture site that stops after applying firm pressure to the area.  Your puncture site feels warm to the touch.  You have pus or a bad smell coming from your puncture site.  You have a fever.  You have chest pain or discomfort that spreads to your neck, jaw, or arm.  You are sweating a lot.  You feel nauseous.  You have a fast or irregular heartbeat.  You have shortness of breath.  You are dizzy or light-headed and feel the need to lie down.  You have pain or numbness in the arm or leg closest to your puncture site. Get help right away if:  Your puncture site suddenly swells.  Your puncture site is bleeding and the bleeding does not stop after applying firm pressure to the area. These symptoms may represent a serious problem that is an emergency. Do not wait to see if the symptoms will go away. Get medical help right away. Call your local emergency services (911 in the [place] Do not drive yourself to the hospital. Summary  After the procedure, it is normal to have bruising and tenderness at the puncture site in your groin, neck, or forearm.  Check your puncture site every day for signs of infection.  Get help right away if your puncture site is bleeding and the bleeding does not stop after applying firm pressure to the area. This is a medical emergency. This information is not intended to replace advice given to you by your health care provider. Make sure you discuss any questions you have with your health care provider.

## 2020-12-09 NOTE — Transfer of Care (Signed)
Immediate Anesthesia Transfer of Care Note  Patient: Linda [name]  Procedure(s) Performed: TRANSESOPHAGEAL ECHOCARDIOGRAM (TEE) (N/A )  Patient Location: Endoscopy Unit  Anesthesia Type:MAC  Level of Consciousness: drowsy  Airway & Oxygen Therapy: Patient Spontanous Breathing and Patient connected to nasal cannula oxygen  Post-op Assessment: Report given to RN and Post -op Vital signs reviewed and stable  Post vital signs: Reviewed and stable  Last Vitals:  Vitals Value Taken Time  BP 151/71   Temp    Pulse 100   Resp 16   SpO2 96%     Last Pain:  Vitals:   12/09/20 0725  TempSrc: Oral  PainSc: 0-No pain         Complications: No complications documented.

## 2020-12-09 NOTE — Interval H&P Note (Signed)
History and Physical Interval Note:  12/09/2020 7:30 AM  Linda [name]  has presented today for surgery, with the diagnosis of R/O thrombus.  The various methods of treatment have been discussed with the patient and family. After consideration of risks, benefits and other options for treatment, the patient has consented to  Procedure(s): TRANSESOPHAGEAL ECHOCARDIOGRAM (TEE) (N/A) as a surgical intervention.  The patient's history has been reviewed, patient examined, no change in status, stable for surgery.  I have reviewed the patient's chart and labs.  Questions were answered to the patient's satisfaction.     Kirk Ruths

## 2020-12-09 NOTE — Interval H&P Note (Signed)
History and Physical Interval Note:  12/09/2020 10:42 AM  Linda [name]  has presented today for surgery, with the diagnosis of afib.  The various methods of treatment have been discussed with the patient and family. After consideration of risks, benefits and other options for treatment, the patient has consented to  Procedure(s): ATRIAL FIBRILLATION ABLATION (N/A) as a surgical intervention.  The patient's history has been reviewed, patient examined, no change in status, stable for surgery.  I have reviewed the patient's chart and labs.  Questions were answered to the patient's satisfaction.     Cardiac CT and TEE reviewed at length with her today.  She reports compliance with eliquis without interruption.  She is instructed to follow-up with Dr Woody Seller for pulmonary nodule follow-up study in 6-12 months.  I have also sent a message to Dr Woody Seller requesting his assistance.  Risk, benefits, and alternatives to EP study and radiofrequency ablation for afib were also discussed in detail today. These risks include but are not limited to stroke, bleeding, vascular damage, tamponade, perforation, damage to the esophagus, lungs, and other structures, pulmonary vein stenosis, worsening renal function, and death. The patient understands these risk and wishes to proceed.     Thompson Grayer

## 2020-12-09 NOTE — H&P (Signed)
Office Visit  11/26/2020 [facility] Cain Saupe, MD  Cardiology  Atrial flutter, unspecified type ([place]) +4 more  Dx  Referred by [name], MD  Reason for Visit    Additional Documentation  Vitals:  BP 144/86Important   Pulse 76  Ht 5\' 3"  (1.6 m)  Wt 96.5 kg  BMI 37.70 kg/m  BSA 2.07 m    More Vitals  Flowsheets:  Anthropometrics,  NEWS,  MEWS Score    Encounter Info:  Billing Info,  History,  Allergies,  Detailed Report     All Notes   Progress Notes by Thompson Grayer, MD at 11/26/2020 11:45 AM  Author: Thompson Grayer, MD Author Type: Physician Filed: 11/26/2020 12:33 PM  Note Status: Signed Cosign: Cosign Not Required Encounter Date: 11/26/2020  Editor: Thompson Grayer, MD (Physician)                 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  Since last being seen in our clinic, the patient reports doing very well.  She has started using BiPAP at night and reports feeling remarkably better.  She does continue to have some SOB which is worse with afib.  She was recently cardioverted but quickly return to AF. Today, she denies symptoms of palpitations, chest pain,  lower extremity edema, dizziness, presyncope, or syncope.  The patient is otherwise without complaint today.       Past Medical History:  Diagnosis Date  . Allergy   . Anemia 07/25/2017  . Anxiety   . Arthritis   . Atrial fibrillation ([place])   . Cataract   . COPD (chronic obstructive pulmonary disease) ([place])   . Depression   . Diabetes mellitus without complication ([place])   . Emphysema of lung ([place])   . GERD (gastroesophageal reflux disease) 10/28/2020  . Hyperlipidemia   . Hypertension   . Oxygen deficiency   . Sleep apnea   . Typical atrial flutter [facility])         Past Surgical History:  Procedure Laterality Date  . A-FLUTTER ABLATION  N/A 10/04/2017   Procedure: A-FLUTTER ABLATION;  Surgeon: Thompson Grayer, MD;  Location: [place] CV LAB;  Service: Cardiovascular;  Laterality: N/A;  . ABDOMINAL HYSTERECTOMY     fibroids  . blood clot removal from base of brain  1990  . CARDIOVERSION N/A 10/04/2020   Procedure: CARDIOVERSION;  Surgeon: Freada Bergeron, MD;  Location: [facility] ENDOSCOPY;  Service: Cardiovascular;  Laterality: N/A;  . CARPAL TUNNEL RELEASE    . CATARACT EXTRACTION W/PHACO Left 06/16/2013   Procedure: CATARACT EXTRACTION PHACO AND INTRAOCULAR LENS PLACEMENT (IOC);  Surgeon: Tonny Branch, MD;  Location: [facility] ORS;  Service: Ophthalmology;  Laterality: Left;  CDE:  12.18  . CATARACT EXTRACTION W/PHACO Right 06/26/2013   Procedure: CATARACT EXTRACTION PHACO AND INTRAOCULAR LENS PLACEMENT (IOC);  Surgeon: Tonny Branch, MD;  Location: [facility] ORS;  Service: Ophthalmology;  Laterality: Right;  CDE:14.71  . CESAREAN SECTION    . CHOLECYSTECTOMY    . COLONOSCOPY WITH PROPOFOL N/A 04/08/2019   Dr.Fields: diverticulosis, 11 simple adenomas removed, hemorrhoids. next colonoscopy in 3 years.   Marland Kitchen DILATION AND CURETTAGE OF UTERUS    . ESOPHAGOGASTRODUODENOSCOPY (EGD) WITH PROPOFOL N/A 04/08/2019   Dr. Oneida Alar: H.pylori gastritis  . EYE SURGERY  12/2016  . FOOT NEUROMA SURGERY Right   . LOOP  RECORDER INSERTION N/A 11/06/2017   Procedure: LOOP RECORDER INSERTION;  Surgeon: Thompson Grayer, MD;  Location: [place] CV LAB;  Service: Cardiovascular;  Laterality: N/A;  . MOUTH SURGERY    . POLYPECTOMY  04/08/2019   Procedure: POLYPECTOMY;  Surgeon: Danie Binder, MD;  Location: [facility];  Service: Endoscopy;;  . TONSILLECTOMY      ROS- all systems are reviewed and negatives except as per HPI above        Current Outpatient Medications  Medication Sig Dispense Refill  . acetaminophen (TYLENOL) 500 MG tablet Take 1,000 mg by mouth every 6 (six) hours as needed for moderate pain or headache.     .  ADVAIR DISKUS 250-50 MCG/DOSE AEPB Inhale 1 puff into the lungs 2 (two) times daily.    Marland Kitchen apixaban (ELIQUIS) 5 MG TABS tablet Take 1 tablet (5 mg total) by mouth 2 (two) times daily. 180 tablet 3  . Ascorbic Acid (VITAMIN C) 1000 MG tablet Take 1,000 mg by mouth daily.    . calcium-vitamin D (OSCAL WITH D) 500-200 MG-UNIT tablet Take 1 tablet by mouth daily.    . cetirizine (ZYRTEC) 10 MG tablet Take 10 mg by mouth daily.    . Cholecalciferol (VITAMIN D3 SUPER STRENGTH) 50 MCG (2000 UT) TABS Take 2,000 Units by mouth daily at 12 noon.    . DULoxetine (CYMBALTA) 30 MG capsule Take 30 mg by mouth daily.    . ferrous sulfate 325 (65 FE) MG EC tablet Take 325 mg by mouth every other day.    . Melatonin 10 MG TABS Take 10 mg by mouth at bedtime.    . metFORMIN (GLUCOPHAGE) 500 MG tablet Take 1,000 mg by mouth daily.     . metoprolol succinate (TOPROL-XL) 50 MG 24 hr tablet Take 25 mg in the morning and 25 mg in the evening. Take with or immediately following a meal. Only take the medication if the heart rate is >60bpm. 252 tablet 4  . Multiple Vitamins-Minerals (ONE DAILY CALCIUM/IRON) TABS Take 1 tablet by mouth daily.    . OXYGEN Inhale 2 L into the lungs at bedtime. With BPAP    . pravastatin (PRAVACHOL) 20 MG tablet Take 1 tablet (20 mg total) daily by mouth. 90 tablet 3  . PROAIR HFA 108 (90 Base) MCG/ACT inhaler Inhale 2 puffs into the lungs See admin instructions. Every 4-6 hours as needed for wheezing or sob    . Probiotic Product (PROBIOTIC DAILY PO) Take 1 capsule by mouth daily.     . vitamin B-12 (CYANOCOBALAMIN) 1000 MCG tablet Take 1,000 mcg by mouth daily.     No current facility-administered medications for this visit.    Physical Exam:    Vitals:   11/26/20 1147  BP: (!) 144/86  Pulse: 76  Weight: 212 lb 12.8 oz (96.5 kg)  Height: 5\' 3"  (1.6 m)    GEN- The patient is well appearing, alert and oriented x 3 today.   Head- normocephalic,  atraumatic Eyes-  Sclera clear, conjunctiva pink Ears- hearing intact Oropharynx- clear Lungs-  normal work of breathing Heart- irregular rate and rhythm  GI- soft  Extremities- no clubbing, cyanosis, or edema     Wt Readings from Last 3 Encounters:  11/26/20 212 lb 12.8 oz (96.5 kg)  10/28/20 212 lb 9.6 oz (96.4 kg)  10/20/20 214 lb 6.4 oz (97.3 kg)    Assessment and Plan:  1. Persistent afib afib had become persistent with burden of 84%.  (  Previously 39%) She was cardioverted 10/04/20 but quickly returned to afib. The patient has symptomatic, recurrent atrial fibrillation. she has failed medical therapy with flecainide.  She did not maintain sinus with flecianide but also reports having "weak spells" with collapse while taking flecainide.  These have resolved off of the medicine. Chads2vasc score is 4.  she is anticoagulated with eliquis . Therapeutic strategies for afib including medicine (tikosyn) and ablation were discussed in detail with the patient today. Risk, benefits, and alternatives to EP study and radiofrequency ablation for afib were also discussed in detail today. These risks include but are not limited to stroke, bleeding, vascular damage, tamponade, perforation, damage to the esophagus, lungs, and other structures, pulmonary vein stenosis, worsening renal function, and death. The patient understands these risk and wishes to proceed.  We will therefore proceed with catheter ablation at the next available time.  Carto, ICE, anesthesia are requested for the procedure.  Will also obtain cardiac CT prior to the procedure to exclude LAA thrombus and further evaluate atrial anatomy.  Echo 2019 reviewed We will repeat echo at this time.  I have ordered this through our [place] office.  2. HTN Stable No change required today  3 OSA She is using BiPAP and feels soooo much better.  She is very happy with this!  Risks, benefits and potential toxicities for medications  prescribed and/or refilled reviewed with patient today.   Thompson Grayer MD, [facility] 11/26/2020 12:12 PM       For TEE; no changes Kirk Ruths

## 2020-12-10 ENCOUNTER — Encounter (HOSPITAL_COMMUNITY): Payer: Self-pay | Admitting: Internal Medicine

## 2020-12-16 ENCOUNTER — Ambulatory Visit (INDEPENDENT_AMBULATORY_CARE_PROVIDER_SITE_OTHER): Payer: Medicare Other

## 2020-12-16 DIAGNOSIS — I4891 Unspecified atrial fibrillation: Secondary | ICD-10-CM

## 2020-12-16 LAB — CUP PACEART REMOTE DEVICE CHECK
Date Time Interrogation Session: 20220324035659
Implantable Pulse Generator Implant Date: 20190212

## 2020-12-23 ENCOUNTER — Other Ambulatory Visit: Payer: Medicare Other

## 2020-12-23 DIAGNOSIS — E1165 Type 2 diabetes mellitus with hyperglycemia: Secondary | ICD-10-CM | POA: Diagnosis not present

## 2020-12-28 NOTE — Progress Notes (Signed)
Carelink Summary Report / Loop Recorder 

## 2021-01-06 ENCOUNTER — Other Ambulatory Visit: Payer: Self-pay

## 2021-01-06 ENCOUNTER — Ambulatory Visit (HOSPITAL_COMMUNITY)
Admission: RE | Admit: 2021-01-06 | Discharge: 2021-01-06 | Disposition: A | Discharge status: Home / Self Care | Payer: Medicare Other | Source: Ambulatory Visit | Attending: Physician Assistant | Admitting: Physician Assistant

## 2021-01-06 ENCOUNTER — Encounter (HOSPITAL_COMMUNITY): Payer: Self-pay | Admitting: Physician Assistant

## 2021-01-06 VITALS — BP 132/80 | HR 75 | Ht 63.0 in | Wt 211.0 lb

## 2021-01-06 DIAGNOSIS — Z87891 Personal history of nicotine dependence: Secondary | ICD-10-CM | POA: Diagnosis not present

## 2021-01-06 DIAGNOSIS — E669 Obesity, unspecified: Secondary | ICD-10-CM | POA: Diagnosis not present

## 2021-01-06 DIAGNOSIS — Z880 Allergy status to penicillin: Secondary | ICD-10-CM | POA: Diagnosis not present

## 2021-01-06 DIAGNOSIS — G4733 Obstructive sleep apnea (adult) (pediatric): Secondary | ICD-10-CM | POA: Diagnosis not present

## 2021-01-06 DIAGNOSIS — I4819 Other persistent atrial fibrillation: Secondary | ICD-10-CM | POA: Diagnosis not present

## 2021-01-06 DIAGNOSIS — Z8249 Family history of ischemic heart disease and other diseases of the circulatory system: Secondary | ICD-10-CM | POA: Insufficient documentation

## 2021-01-06 DIAGNOSIS — Z888 Allergy status to other drugs, medicaments and biological substances status: Secondary | ICD-10-CM | POA: Insufficient documentation

## 2021-01-06 DIAGNOSIS — Z7951 Long term (current) use of inhaled steroids: Secondary | ICD-10-CM | POA: Insufficient documentation

## 2021-01-06 DIAGNOSIS — J449 Chronic obstructive pulmonary disease, unspecified: Secondary | ICD-10-CM | POA: Diagnosis not present

## 2021-01-06 DIAGNOSIS — I10 Essential (primary) hypertension: Secondary | ICD-10-CM | POA: Insufficient documentation

## 2021-01-06 DIAGNOSIS — Z79899 Other long term (current) drug therapy: Secondary | ICD-10-CM | POA: Insufficient documentation

## 2021-01-06 DIAGNOSIS — Z9981 Dependence on supplemental oxygen: Secondary | ICD-10-CM | POA: Diagnosis not present

## 2021-01-06 DIAGNOSIS — E119 Type 2 diabetes mellitus without complications: Secondary | ICD-10-CM | POA: Insufficient documentation

## 2021-01-06 DIAGNOSIS — Z7984 Long term (current) use of oral hypoglycemic drugs: Secondary | ICD-10-CM | POA: Insufficient documentation

## 2021-01-06 DIAGNOSIS — D6869 Other thrombophilia: Secondary | ICD-10-CM | POA: Diagnosis not present

## 2021-01-06 DIAGNOSIS — Z9989 Dependence on other enabling machines and devices: Secondary | ICD-10-CM | POA: Diagnosis not present

## 2021-01-06 DIAGNOSIS — Z7901 Long term (current) use of anticoagulants: Secondary | ICD-10-CM | POA: Diagnosis not present

## 2021-01-06 DIAGNOSIS — E785 Hyperlipidemia, unspecified: Secondary | ICD-10-CM | POA: Diagnosis not present

## 2021-01-06 DIAGNOSIS — Z882 Allergy status to sulfonamides status: Secondary | ICD-10-CM | POA: Diagnosis not present

## 2021-01-06 DIAGNOSIS — Z6837 Body mass index (BMI) 37.0-37.9, adult: Secondary | ICD-10-CM | POA: Diagnosis not present

## 2021-01-06 NOTE — Progress Notes (Signed)
Primary Care Physician: [name], MD Primary Electrophysiologist: Dr Rayann Heman Referring Physician: 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 female with a history of persistent atrial fibrillation, atrial flutter s/p ablation, OSA, HTN, HLD, COPD, DM who presents for follow up in the [facility]. The patient was initially diagnosed with atrial fibrillation on ILR after her atrial flutter ablation 2019. Patient is on Eliquis for a CHADS2VASC score of 4. The device clinic received an alert for increasing afib burden. Her BB was increased on 08/11/20. She was seen by Dr Rayann Heman on 09/10/20 and started on flecainide. Patient was persistent in afib with her ILR showing an 82% burden despite starting flecainide. She does report that she was hospitalized for community acquired PNA. Patient is s/p DCCV on 10/04/20. Unfortunately, she quickly reverted back to afib.  On follow up today, patient is s/p afib ablation with Dr Rayann Heman on 12/09/20. Patient reports she has done well since her procedure. She did have one episode of afib which lasted about 1 hour at most. She denies CP, swallowing pain, or groin issues. She has had more frequent headaches.  Today, she denies symptoms of palpitations, chest pain, orthopnea, PND, lower extremity edema, presyncope, syncope, bleeding, or neurologic sequela. The patient is tolerating medications without difficulties and is otherwise without complaint today.    Atrial Fibrillation Risk Factors:  she does have symptoms or diagnosis of sleep apnea. she is not compliant with CPAP therapy. she does not have a history of rheumatic fever. she does not have a history of alcohol use.  she has a BMI of Body mass index is 37.38 kg/m.Marland Kitchen Filed Weights   01/06/21 1320  Weight: 95.7 kg    Family History  Problem Relation Age of Onset  . Emphysema Mother   . Alcohol abuse Mother   . Arthritis Mother   . COPD Mother   . Depression Mother   .  Hyperlipidemia Mother   . Hypertension Mother   . Heart attack Father   . Cancer Father        lung  . Heart disease Father   . Diabetes Brother   . COPD Brother   . Other Maternal Grandmother        swine flu 1919  . Colon cancer Neg Hx   . Colon polyps Neg Hx      Atrial Fibrillation Management history:  Previous antiarrhythmic drugs: flecainide Previous cardioversions: 10/04/20 Previous ablations: 12/09/20 CHADS2VASC score: 4 Anticoagulation history: Eliquis   Past Medical History:  Diagnosis Date  . Allergy   . Anemia 07/25/2017  . Anxiety   . Arthritis   . Atrial fibrillation ([place])   . Cataract   . COPD (chronic obstructive pulmonary disease) ([place])   . Depression   . Diabetes mellitus without complication ([place])   . Emphysema of lung ([place])   . GERD (gastroesophageal reflux disease) 10/28/2020  . Hyperlipidemia   . Hypertension   . Oxygen deficiency   . Sleep apnea   . Typical atrial flutter [facility])    Past Surgical History:  Procedure Laterality Date  . A-FLUTTER ABLATION N/A 10/04/2017   Procedure: A-FLUTTER ABLATION;  Surgeon: Thompson Grayer, MD;  Location: [place] [facility];  Service: Cardiovascular;  Laterality: N/A;  . ABDOMINAL HYSTERECTOMY     fibroids  . ATRIAL FIBRILLATION ABLATION N/A 12/09/2020   Procedure: ATRIAL FIBRILLATION ABLATION;  Surgeon: Thompson Grayer, MD;  Location: [place] [facility];  Service:  Cardiovascular;  Laterality: N/A;  . blood clot removal from base of brain  1990  . CARDIOVERSION N/A 10/04/2020   Procedure: CARDIOVERSION;  Surgeon: Freada Bergeron, MD;  Location: [facility] ENDOSCOPY;  Service: Cardiovascular;  Laterality: N/A;  . CARPAL TUNNEL RELEASE    . CATARACT EXTRACTION W/PHACO Left 06/16/2013   Procedure: CATARACT EXTRACTION PHACO AND INTRAOCULAR LENS PLACEMENT (IOC);  Surgeon: Tonny Branch, MD;  Location: [facility] ORS;  Service: Ophthalmology;  Laterality: Left;  CDE:  12.18  . CATARACT EXTRACTION W/PHACO Right 06/26/2013   Procedure:  CATARACT EXTRACTION PHACO AND INTRAOCULAR LENS PLACEMENT (IOC);  Surgeon: Tonny Branch, MD;  Location: [facility] ORS;  Service: Ophthalmology;  Laterality: Right;  CDE:14.71  . CESAREAN SECTION    . CHOLECYSTECTOMY    . COLONOSCOPY WITH PROPOFOL N/A 04/08/2019   Dr.Fields: diverticulosis, 11 simple adenomas removed, hemorrhoids. next colonoscopy in 3 years.   Marland Kitchen DILATION AND CURETTAGE OF UTERUS    . ESOPHAGOGASTRODUODENOSCOPY (EGD) WITH PROPOFOL N/A 04/08/2019   Dr. Oneida Alar: H.pylori gastritis  . EYE SURGERY  12/2016  . FOOT NEUROMA SURGERY Right   . LOOP RECORDER INSERTION N/A 11/06/2017   Procedure: LOOP RECORDER INSERTION;  Surgeon: Thompson Grayer, MD;  Location: [place] [facility];  Service: Cardiovascular;  Laterality: N/A;  . MOUTH SURGERY    . POLYPECTOMY  04/08/2019   Procedure: POLYPECTOMY;  Surgeon: Danie Binder, MD;  Location: [facility];  Service: Endoscopy;;  . TEE WITHOUT CARDIOVERSION N/A 12/09/2020   Procedure: TRANSESOPHAGEAL ECHOCARDIOGRAM (TEE);  Surgeon: Lelon Perla, MD;  Location: [facility] ENDOSCOPY;  Service: Cardiovascular;  Laterality: N/A;  . TONSILLECTOMY      Current Outpatient Medications  Medication Sig Dispense Refill  . acetaminophen (TYLENOL) 500 MG tablet Take 1,000 mg by mouth every 6 (six) hours as needed for moderate pain or headache.     . ADVAIR DISKUS 250-50 MCG/DOSE AEPB Inhale 1 puff into the lungs 2 (two) times daily.    Marland Kitchen apixaban (ELIQUIS) 5 MG TABS tablet Take 1 tablet (5 mg total) by mouth 2 (two) times daily. 180 tablet 3  . Ascorbic Acid (VITAMIN C) 1000 MG tablet Take 1,000 mg by mouth daily.    . calcium carbonate (TUMS - DOSED IN MG ELEMENTAL CALCIUM) 500 MG chewable tablet Chew 1 tablet by mouth daily.    . calcium-vitamin D (OSCAL WITH D) 500-200 MG-UNIT tablet Take 1 tablet by mouth daily.    . cetirizine (ZYRTEC) 10 MG tablet Take 10 mg by mouth daily.    . Cholecalciferol (VITAMIN D3 SUPER STRENGTH) 50 MCG (2000 UT) TABS Take 2,000 Units by  mouth daily at 12 noon.    . DULoxetine (CYMBALTA) 30 MG capsule Take 30 mg by mouth daily.    . ferrous sulfate 325 (65 FE) MG EC tablet Take 325 mg by mouth every other day.    . Melatonin 10 MG TABS Take 10 mg by mouth at bedtime.    . metFORMIN (GLUCOPHAGE) 500 MG tablet Take 500 mg by mouth daily with breakfast.    . metoprolol succinate (TOPROL-XL) 50 MG 24 hr tablet Take 25 mg in the morning and 25 mg in the evening. Take with or immediately following a meal. Only take the medication if the heart rate is >60bpm. 252 tablet 4  . Multiple Vitamins-Minerals (ONE DAILY CALCIUM/IRON) TABS Take 1 tablet by mouth daily.    . OXYGEN Inhale 2 L into the lungs at bedtime. With BPAP    .  pantoprazole (PROTONIX) 40 MG tablet Take 1 tablet (40 mg total) by mouth daily. 45 tablet 0  . pravastatin (PRAVACHOL) 20 MG tablet Take 1 tablet (20 mg total) daily by mouth. 90 tablet 3  . PROAIR HFA 108 (90 Base) MCG/ACT inhaler Inhale 2 puffs into the lungs every 4 (four) hours as needed for wheezing or shortness of breath. Every 4-6 hours as needed for wheezing or sob    . Probiotic Product (PROBIOTIC DAILY PO) Take 1 capsule by mouth daily.     . vitamin B-12 (CYANOCOBALAMIN) 1000 MCG tablet Take 1,000 mcg by mouth daily.     No current facility-administered medications for this encounter.    Allergies  Allergen Reactions  . Trazodone And Nefazodone Hives and Other (See Comments)    "blisters my skin"   . Benicar [Olmesartan]     Pt does not remember   . Nickel Other (See Comments)    "oozing"  . Tetanus Toxoids Other (See Comments)    Left a knot  . Adhesive [Tape] Itching  . Bactrim [Sulfamethoxazole-Trimethoprim] Itching and Rash  . Penicillins Itching, Rash and Other (See Comments)    Has patient had a PCN reaction causing immediate rash, facial/tongue/throat swelling, SOB or lightheadedness with hypotension: Unknown Has patient had a PCN reaction causing severe rash involving mucus membranes or  skin necrosis: Unknown Has patient had a PCN reaction that required hospitalization: No Has patient had a PCN reaction occurring within the last 10 years: No If all of the above answers are "NO", then may proceed with Cephalosporin use  . Ppd [Tuberculin Purified Protein Derivative] Swelling and Other (See Comments)    Local arm swelling  . Sulfamethoxazole-Trimethoprim Itching and Rash    Social History   Socioeconomic History  . Marital status: Widowed    Spouse name: Not on file  . Number of children: 1  . Years of education: 19  . Highest education level: Not on file  Occupational History  . Occupation: retired    Comment: catering, Engineer, maintenance  Tobacco Use  . Smoking status: Former Smoker    Packs/day: 1.50    Types: Cigarettes    Start date: 09/25/1958    Quit date: 06/06/2001    Years since quitting: 19.6  . Smokeless tobacco: Never Used  Vaping Use  . Vaping Use: Never used  Substance and Sexual Activity  . Alcohol use: No  . Drug use: No  . Sexual activity: Not Currently  Other Topics Concern  . Not on file  Social History Narrative   Lives alone   Son lives in [place]   TV, reads, puzzles   Social Determinants of Health   Financial Resource Strain: Not on file  Food Insecurity: Not on file  Transportation Needs: Not on file  Physical Activity: Not on file  Stress: Not on file  Social Connections: Not on file  Intimate Partner Violence: Not on file     ROS- All systems are reviewed and negative except as per the HPI above.  Physical Exam: Vitals:   01/06/21 1320  BP: 132/80  Pulse: 75  Weight: 95.7 kg  Height: 5\' 3"  (1.6 m)    GEN- The patient is a well appearing obese female, alert and oriented x 3 today.   HEENT-head normocephalic, atraumatic, sclera clear, conjunctiva pink, hearing intact, trachea midline. Lungs- Clear to ausculation bilaterally, normal work of breathing Heart- Regular rate and rhythm, no murmurs, rubs or gallops  GI-  soft, NT, ND, +  BS Extremities- no clubbing, cyanosis, or edema MS- no significant deformity or atrophy Skin- no rash or lesion Psych- euthymic mood, full affect Neuro- strength and sensation are intact   Wt Readings from Last 3 Encounters:  01/06/21 95.7 kg  12/09/20 95.3 kg  11/26/20 96.5 kg    EKG today demonstrates  SR Vent. rate 75 BPM PR interval 138 ms QRS duration 80 ms QT/QTcB 392/437 ms   TEE 12/09/20 demonstrated  1. Left ventricular ejection fraction, by estimation, is 55 to 60%. The  left ventricle has normal function.  2. Right ventricular systolic function is normal. The right ventricular  size is normal.  3. Left atrial size was moderately dilated. No left atrial/left atrial  appendage thrombus was detected.  4. The mitral valve is normal in structure. Mild to moderate mitral valve  regurgitation.  5. The aortic valve is tricuspid. Aortic valve regurgitation is mild.  Mild aortic valve sclerosis is present, with no evidence of aortic valve  stenosis.  6. There is mild (Grade II) plaque involving the descending aorta.  Epic records are reviewed at length today  CHA2DS2-VASc Score = 4  The patient's score is based upon: CHF History: No HTN History: Yes Diabetes History: Yes Stroke History: No Vascular Disease History: No Age Score: 1 Gender Score: 1      ASSESSMENT AND PLAN: 1. Persistent Atrial Fibrillation/atrial flutter The patient's CHA2DS2-VASc score is 4, indicating a 4.8% annual risk of stroke.   S/p afib ablation with Dr Rayann Heman on 12/09/20. Patient appears to be maintaining SR. Continue Eliquis 5 mg BID with no missed doses for at least 3 months post ablation.  Continue Toprol 25 mg BID Will stop pantoprazole today to see if headache improves.   2. Secondary Hypercoagulable State (ICD10:  D68.69) The patient is at significant risk for stroke/thromboembolism based upon her CHA2DS2-VASc Score of 4.  Continue Apixaban (Eliquis).   3.  Obesity Body mass index is 37.38 kg/m. Lifestyle modification was discussed and encouraged including regular physical activity and weight reduction.  4. Obstructive sleep apnea Patient reports compliance with CPAP therapy.  5. HTN Stable, no changes today.   Follow up with Dr Rayann Heman as scheduled.    [facility] [address] [place], [place] [address] [phone] 01/06/2021 1:29 PM

## 2021-01-06 NOTE — Patient Instructions (Signed)
Stop protonix ?

## 2021-01-13 DIAGNOSIS — Z23 Encounter for immunization: Secondary | ICD-10-CM | POA: Diagnosis not present

## 2021-01-17 ENCOUNTER — Ambulatory Visit (INDEPENDENT_AMBULATORY_CARE_PROVIDER_SITE_OTHER): Payer: Medicare Other

## 2021-01-17 DIAGNOSIS — I4891 Unspecified atrial fibrillation: Secondary | ICD-10-CM

## 2021-01-17 LAB — CUP PACEART REMOTE DEVICE CHECK
Date Time Interrogation Session: 20220424041100
Implantable Pulse Generator Implant Date: 20190212

## 2021-01-18 ENCOUNTER — Other Ambulatory Visit: Payer: Self-pay

## 2021-01-18 ENCOUNTER — Inpatient Hospital Stay (HOSPITAL_COMMUNITY): Payer: Medicare Other | Attending: Hematology

## 2021-01-18 DIAGNOSIS — Z7901 Long term (current) use of anticoagulants: Secondary | ICD-10-CM | POA: Diagnosis not present

## 2021-01-18 DIAGNOSIS — D509 Iron deficiency anemia, unspecified: Secondary | ICD-10-CM | POA: Insufficient documentation

## 2021-01-18 DIAGNOSIS — I4891 Unspecified atrial fibrillation: Secondary | ICD-10-CM | POA: Insufficient documentation

## 2021-01-18 DIAGNOSIS — D696 Thrombocytopenia, unspecified: Secondary | ICD-10-CM | POA: Diagnosis not present

## 2021-01-18 DIAGNOSIS — R911 Solitary pulmonary nodule: Secondary | ICD-10-CM | POA: Insufficient documentation

## 2021-01-18 DIAGNOSIS — D500 Iron deficiency anemia secondary to blood loss (chronic): Secondary | ICD-10-CM

## 2021-01-18 DIAGNOSIS — Z87891 Personal history of nicotine dependence: Secondary | ICD-10-CM | POA: Insufficient documentation

## 2021-01-18 LAB — COMPREHENSIVE METABOLIC PANEL
ALT: 25 U/L (ref 0–44)
AST: 35 U/L (ref 15–41)
Albumin: 3.3 g/dL — ABNORMAL LOW (ref 3.5–5.0)
Alkaline Phosphatase: 109 U/L (ref 38–126)
Anion gap: 8 (ref 5–15)
BUN: 19 mg/dL (ref 8–23)
CO2: 30 mmol/L (ref 22–32)
Calcium: 9.3 mg/dL (ref 8.9–10.3)
Chloride: 100 mmol/L (ref 98–111)
Creatinine, Ser: 0.79 mg/dL (ref 0.44–1.00)
GFR, Estimated: >60 mL/min (ref >60)
Glucose, Bld: 184 mg/dL — ABNORMAL HIGH (ref 70–99)
Potassium: 3.7 mmol/L (ref 3.5–5.1)
Sodium: 138 mmol/L (ref 135–145)
Total Bilirubin: 1.7 mg/dL — ABNORMAL HIGH (ref 0.3–1.2)
Total Protein: 6.5 g/dL (ref 6.5–8.1)

## 2021-01-18 LAB — CBC WITH DIFFERENTIAL/PLATELET
Abs Immature Granulocytes: 0.02 10*3/uL (ref 0.00–0.07)
Basophils Absolute: 0 10*3/uL (ref 0.0–0.1)
Basophils Relative: 0 %
Eosinophils Absolute: 0.2 10*3/uL (ref 0.0–0.5)
Eosinophils Relative: 4 %
HCT: 38.5 % (ref 36.0–46.0)
Hemoglobin: 12.5 g/dL (ref 12.0–15.0)
Immature Granulocytes: 0 %
Lymphocytes Relative: 19 %
Lymphs Abs: 1 10*3/uL (ref 0.7–4.0)
MCH: 33.5 pg (ref 26.0–34.0)
MCHC: 32.5 g/dL (ref 30.0–36.0)
MCV: 103.2 fL — ABNORMAL HIGH (ref 80.0–100.0)
Monocytes Absolute: 0.3 10*3/uL (ref 0.1–1.0)
Monocytes Relative: 6 %
Neutro Abs: 3.7 10*3/uL (ref 1.7–7.7)
Neutrophils Relative %: 71 %
Platelets: 112 10*3/uL — ABNORMAL LOW (ref 150–400)
RBC: 3.73 MIL/uL — ABNORMAL LOW (ref 3.87–5.11)
RDW: 13.3 % (ref 11.5–15.5)
WBC: 5.2 10*3/uL (ref 4.0–10.5)
nRBC: 0 % (ref 0.0–0.2)

## 2021-01-18 LAB — IRON AND TIBC
Iron: 81 ug/dL (ref 28–170)
Saturation Ratios: 21 % (ref 10.4–31.8)
TIBC: 386 ug/dL (ref 250–450)
UIBC: 305 ug/dL

## 2021-01-18 LAB — VITAMIN B12: Vitamin B-12: 1242 pg/mL — ABNORMAL HIGH (ref 180–914)

## 2021-01-18 LAB — FERRITIN: Ferritin: 33 ng/mL (ref 11–307)

## 2021-01-18 LAB — FOLATE: Folate: 28.3 ng/mL (ref >5.9)

## 2021-01-19 ENCOUNTER — Inpatient Hospital Stay (HOSPITAL_BASED_OUTPATIENT_CLINIC_OR_DEPARTMENT_OTHER): Payer: Medicare Other | Admitting: Physician Assistant

## 2021-01-19 VITALS — BP 130/58 | HR 72 | Temp 97.0°F | Resp 20 | Wt 211.2 lb

## 2021-01-19 DIAGNOSIS — I4891 Unspecified atrial fibrillation: Secondary | ICD-10-CM | POA: Diagnosis not present

## 2021-01-19 DIAGNOSIS — D500 Iron deficiency anemia secondary to blood loss (chronic): Secondary | ICD-10-CM

## 2021-01-19 DIAGNOSIS — R911 Solitary pulmonary nodule: Secondary | ICD-10-CM | POA: Diagnosis not present

## 2021-01-19 DIAGNOSIS — E538 Deficiency of other specified B group vitamins: Secondary | ICD-10-CM

## 2021-01-19 DIAGNOSIS — Z87891 Personal history of nicotine dependence: Secondary | ICD-10-CM | POA: Diagnosis not present

## 2021-01-19 DIAGNOSIS — D696 Thrombocytopenia, unspecified: Secondary | ICD-10-CM | POA: Diagnosis not present

## 2021-01-19 DIAGNOSIS — D509 Iron deficiency anemia, unspecified: Secondary | ICD-10-CM | POA: Diagnosis not present

## 2021-01-19 DIAGNOSIS — Z7901 Long term (current) use of anticoagulants: Secondary | ICD-10-CM | POA: Diagnosis not present

## 2021-01-19 NOTE — Patient Instructions (Signed)
[place] at [facility] Discharge Instructions  You were seen today by Tarri Abernethy PA-C for your iron deficiency anemia.  Your blood levels today are good, but you are low on iron.  We will give your IV iron next week, and then will recheck your labs and see you for follow-up in 6 months.    LABS: Return in 6 months   OTHER TESTS: None  MEDICATIONS: Change your B12 to every other day  FOLLOW-UP APPOINTMENT: 6 months   Thank you for choosing [place] at [facility] to provide your oncology and hematology care.  To afford each patient quality time with our provider, please arrive at least 15 minutes before your scheduled appointment time.   If you have a lab appointment with the [place] please come in thru the Main Entrance and check in at the main information desk.  You need to re-schedule your appointment should you arrive 10 or more minutes late.  We strive to give you quality time with our providers, and arriving late affects you and other patients whose appointments are after yours.  Also, if you no show three or more times for appointments you may be dismissed from the clinic at the providers discretion.     Again, thank you for choosing [facility].  Our hope is that these requests will decrease the amount of time that you wait before being seen by our physicians.       _____________________________________________________________  Should you have questions after your visit to [facility], please contact our office at [phone] and follow the prompts.  Our office hours are 8:00 a.m. and 4:30 p.m. Monday - Friday.  Please note that voicemails left after 4:00 p.m. may not be returned until the following business day.  We are closed weekends and major holidays.  You do have access to a nurse 24-7, just call the main number to the clinic [phone] and do not press any options, hold on the line and  a nurse will answer the phone.    For prescription refill requests, have your pharmacy contact our office and allow 72 hours.    Due to Covid, you will need to wear a mask upon entering the hospital. If you do not have a mask, a mask will be given to you at the Main Entrance upon arrival. For doctor visits, patients may have 1 support person age 12 or older with them. For treatment visits, patients can not have anyone with them due to social distancing guidelines and our immunocompromised population.

## 2021-01-19 NOTE — Progress Notes (Signed)
Linda [name], [place] [address]   CLINIC:  Medical Oncology/Hematology  PCP:  Linda Chroman, MD Linda [name] [phone]   REASON FOR VISIT:  Follow-up for iron-deficiency anemia  CURRENT THERAPY: Intermittent IV iron (last given 08/06/2020)  INTERVAL HISTORY:  Linda [name] 75 y.o. female returns for routine follow-up of her iron deficiency anemia. She was last seen by Dr. Delton Coombes on 07/21/2020.  Her last IV iron was 08/06/2020.  Since her last visit, she did undergo radiofrequency ablation procedure for atrial fibrillation on 12/09/2020.  She was also admitted at [facility] in [place] in both December 2021 and January 2022 due to respiratory failure in the setting of CHF and pneumonia.  Otherwise, she reports that she has been doing relatively well since her last visit, apart from ongoing fatigue.  At her last visit, she received IV iron infusion, but did not feel any better.  She has been feeling fatigued with energy at 50% for the past 2 months.  Appetite has been about 75%, but she has had no unintentional weight loss.  She denies any bleeding within the at 6 months, but has previously had bloody discharge from her navel.  No bright red blood per rectum or melena.  She denies B symptoms.  She has not noticed any recent bleeding such as epistaxis, hematuria, hematemesis, melena, or hematochezia.  Denies recent chest pain on exertion, shortness of breath on minimal exertion, pre-syncopal episodes, or palpitations.   She continues to take ferrous sulfate every other day, but is unable to tolerate daily dosing due to constipation.  She continues to crave ice multiple times each day.  She remains on Eliquis for her Afib.   REVIEW OF SYSTEMS:  Review of Systems  Constitutional: Positive for fatigue. Negative for appetite change, chills, diaphoresis, fever and unexpected weight change.  HENT:   Negative for lump/mass and  nosebleeds.   Eyes: Negative for eye problems.  Respiratory: Negative for cough, hemoptysis and shortness of breath.   Cardiovascular: Positive for leg swelling (after sitting for prolonged periods). Negative for chest pain and palpitations.  Gastrointestinal: Negative for abdominal pain, blood in stool, constipation, diarrhea, nausea and vomiting.  Genitourinary: Negative for hematuria.   Skin: Negative.   Neurological: Negative for dizziness, headaches and light-headedness.  Hematological: Does not bruise/bleed easily.    PAST MEDICAL/SURGICAL HISTORY:  Past Medical History:  Diagnosis Date  . Allergy   . Anemia 07/25/2017  . Anxiety   . Arthritis   . Atrial fibrillation ([place])   . Cataract   . COPD (chronic obstructive pulmonary disease) ([place])   . Depression   . Diabetes mellitus without complication ([place])   . Emphysema of lung ([place])   . GERD (gastroesophageal reflux disease) 10/28/2020  . Hyperlipidemia   . Hypertension   . Oxygen deficiency   . Sleep apnea   . Typical atrial flutter [facility])    Past Surgical History:  Procedure Laterality Date  . A-FLUTTER ABLATION N/A 10/04/2017   Procedure: A-FLUTTER ABLATION;  Surgeon: Thompson Grayer, MD;  Location: [place] CV LAB;  Service: Cardiovascular;  Laterality: N/A;  . ABDOMINAL HYSTERECTOMY     fibroids  . ATRIAL FIBRILLATION ABLATION N/A 12/09/2020   Procedure: ATRIAL FIBRILLATION ABLATION;  Surgeon: Thompson Grayer, MD;  Location: [place] CV LAB;  Service: Cardiovascular;  Laterality: N/A;  . blood clot removal from base of brain  1990  . CARDIOVERSION N/A 10/04/2020  Procedure: CARDIOVERSION;  Surgeon: Freada Bergeron, MD;  Location: [facility] ENDOSCOPY;  Service: Cardiovascular;  Laterality: N/A;  . CARPAL TUNNEL RELEASE    . CATARACT EXTRACTION W/PHACO Left 06/16/2013   Procedure: CATARACT EXTRACTION PHACO AND INTRAOCULAR LENS PLACEMENT (IOC);  Surgeon: Tonny Branch, MD;  Location: [facility] ORS;  Service: Ophthalmology;   Laterality: Left;  CDE:  12.18  . CATARACT EXTRACTION W/PHACO Right 06/26/2013   Procedure: CATARACT EXTRACTION PHACO AND INTRAOCULAR LENS PLACEMENT (IOC);  Surgeon: Tonny Branch, MD;  Location: [facility] ORS;  Service: Ophthalmology;  Laterality: Right;  CDE:14.71  . CESAREAN SECTION    . CHOLECYSTECTOMY    . COLONOSCOPY WITH PROPOFOL N/A 04/08/2019   Dr.Fields: diverticulosis, 11 simple adenomas removed, hemorrhoids. next colonoscopy in 3 years.   Marland Kitchen DILATION AND CURETTAGE OF UTERUS    . ESOPHAGOGASTRODUODENOSCOPY (EGD) WITH PROPOFOL N/A 04/08/2019   Dr. Oneida Alar: H.pylori gastritis  . EYE SURGERY  12/2016  . FOOT NEUROMA SURGERY Right   . LOOP RECORDER INSERTION N/A 11/06/2017   Procedure: LOOP RECORDER INSERTION;  Surgeon: Thompson Grayer, MD;  Location: [place] CV LAB;  Service: Cardiovascular;  Laterality: N/A;  . MOUTH SURGERY    . POLYPECTOMY  04/08/2019   Procedure: POLYPECTOMY;  Surgeon: Danie Binder, MD;  Location: [facility] ENDO SUITE;  Service: Endoscopy;;  . TEE WITHOUT CARDIOVERSION N/A 12/09/2020   Procedure: TRANSESOPHAGEAL ECHOCARDIOGRAM (TEE);  Surgeon: Lelon Perla, MD;  Location: [facility] ENDOSCOPY;  Service: Cardiovascular;  Laterality: N/A;  . TONSILLECTOMY       SOCIAL HISTORY:  Social History   Socioeconomic History  . Marital status: Widowed    Spouse name: Not on file  . Number of children: 1  . Years of education: 31  . Highest education level: Not on file  Occupational History  . Occupation: retired    Comment: catering, Engineer, maintenance  Tobacco Use  . Smoking status: Former Smoker    Packs/day: 1.50    Types: Cigarettes    Start date: 09/25/1958    Quit date: 06/06/2001    Years since quitting: 19.6  . Smokeless tobacco: Never Used  Vaping Use  . Vaping Use: Never used  Substance and Sexual Activity  . Alcohol use: No  . Drug use: No  . Sexual activity: Not Currently  Other Topics Concern  . Not on file  Social History Narrative   Lives alone   Son lives in  [place]   TV, reads, puzzles   Social Determinants of Health   Financial Resource Strain: Not on file  Food Insecurity: Not on file  Transportation Needs: Not on file  Physical Activity: Not on file  Stress: Not on file  Social Connections: Not on file  Intimate Partner Violence: Not on file    FAMILY HISTORY:  Family History  Problem Relation Age of Onset  . Emphysema Mother   . Alcohol abuse Mother   . Arthritis Mother   . COPD Mother   . Depression Mother   . Hyperlipidemia Mother   . Hypertension Mother   . Heart attack Father   . Cancer Father        lung  . Heart disease Father   . Diabetes Brother   . COPD Brother   . Other Maternal Grandmother        swine flu 1919  . Colon cancer Neg Hx   . Colon polyps Neg Hx     CURRENT MEDICATIONS:  Outpatient Encounter Medications as of 01/19/2021  Medication  Sig  . acetaminophen (TYLENOL) 500 MG tablet Take 1,000 mg by mouth every 6 (six) hours as needed for moderate pain or headache.   . ADVAIR DISKUS 250-50 MCG/DOSE AEPB Inhale 1 puff into the lungs 2 (two) times daily.  Marland Kitchen apixaban (ELIQUIS) 5 MG TABS tablet Take 1 tablet (5 mg total) by mouth 2 (two) times daily.  . Ascorbic Acid (VITAMIN C) 1000 MG tablet Take 1,000 mg by mouth daily.  . calcium carbonate (TUMS - DOSED IN MG ELEMENTAL CALCIUM) 500 MG chewable tablet Chew 1 tablet by mouth daily.  . calcium-vitamin D (OSCAL WITH D) 500-200 MG-UNIT tablet Take 1 tablet by mouth daily.  . cetirizine (ZYRTEC) 10 MG tablet Take 10 mg by mouth daily.  . Cholecalciferol (VITAMIN D3 SUPER STRENGTH) 50 MCG (2000 UT) TABS Take 2,000 Units by mouth daily at 12 noon.  . DULoxetine (CYMBALTA) 30 MG capsule Take 30 mg by mouth daily.  . ferrous sulfate 325 (65 FE) MG EC tablet Take 325 mg by mouth every other day.  . Melatonin 10 MG TABS Take 10 mg by mouth at bedtime.  . metFORMIN (GLUCOPHAGE) 500 MG tablet Take 500 mg by mouth daily with breakfast.  . metoprolol  succinate (TOPROL-XL) 50 MG 24 hr tablet Take 25 mg in the morning and 25 mg in the evening. Take with or immediately following a meal. Only take the medication if the heart rate is >60bpm.  . Multiple Vitamins-Minerals (ONE DAILY CALCIUM/IRON) TABS Take 1 tablet by mouth daily.  . OXYGEN Inhale 2 L into the lungs at bedtime. With BPAP  . pravastatin (PRAVACHOL) 20 MG tablet Take 1 tablet (20 mg total) daily by mouth.  Marland Kitchen PROAIR HFA 108 (90 Base) MCG/ACT inhaler Inhale 2 puffs into the lungs every 4 (four) hours as needed for wheezing or shortness of breath. Every 4-6 hours as needed for wheezing or sob  . Probiotic Product (PROBIOTIC DAILY PO) Take 1 capsule by mouth daily.   . vitamin B-12 (CYANOCOBALAMIN) 1000 MCG tablet Take 1,000 mcg by mouth daily.   No facility-administered encounter medications on file as of 01/19/2021.    ALLERGIES:  Allergies  Allergen Reactions  . Trazodone And Nefazodone Hives and Other (See Comments)    "blisters my skin"   . Benicar [Olmesartan]     Pt does not remember   . Nickel Other (See Comments)    "oozing"  . Tetanus Toxoids Other (See Comments)    Left a knot  . Adhesive [Tape] Itching  . Bactrim [Sulfamethoxazole-Trimethoprim] Itching and Rash  . Penicillins Itching, Rash and Other (See Comments)    Has patient had a PCN reaction causing immediate rash, facial/tongue/throat swelling, SOB or lightheadedness with hypotension: Unknown Has patient had a PCN reaction causing severe rash involving mucus membranes or skin necrosis: Unknown Has patient had a PCN reaction that required hospitalization: No Has patient had a PCN reaction occurring within the last 10 years: No If all of the above answers are "NO", then may proceed with Cephalosporin use  . Ppd [Tuberculin Purified Protein Derivative] Swelling and Other (See Comments)    Local arm swelling  . Sulfamethoxazole-Trimethoprim Itching and Rash     PHYSICAL EXAM:  ECOG PERFORMANCE STATUS: 1 -  Symptomatic but completely ambulatory  Vitals:   01/19/21 1342  BP: (!) 130/58  Pulse: 72  Resp: 20  Temp: (!) 97 F (36.1 C)  SpO2: 98%   Filed Weights   01/19/21 1342  Weight: 211 lb  3.2 oz (95.8 kg)   Physical Exam Constitutional:      Appearance: Normal appearance. She is obese.  HENT:     Head: Normocephalic and atraumatic.     Mouth/Throat:     Mouth: Mucous membranes are moist.  Eyes:     Extraocular Movements: Extraocular movements intact.     Pupils: Pupils are equal, round, and reactive to light.  Cardiovascular:     Rate and Rhythm: Normal rate and regular rhythm.     Pulses: Normal pulses.     Heart sounds: Normal heart sounds.  Pulmonary:     Effort: Pulmonary effort is normal.     Breath sounds: Normal breath sounds.  Abdominal:     General: Bowel sounds are normal.     Palpations: Abdomen is soft.     Tenderness: There is no abdominal tenderness.  Musculoskeletal:        General: No swelling.     Right lower leg: Edema (trace ankle edema) present.     Left lower leg: Edema (trace ankle edema) present.  Lymphadenopathy:     Cervical: No cervical adenopathy.  Skin:    General: Skin is warm and dry.  Neurological:     General: No focal deficit present.     Mental Status: She is alert and oriented to person, place, and time.  Psychiatric:        Mood and Affect: Mood normal.        Behavior: Behavior normal.     LABORATORY DATA:  I have reviewed the labs as listed.  CBC    Component Value Date/Time   WBC 5.2 01/18/2021 1033   RBC 3.73 (L) 01/18/2021 1033   HGB 12.5 01/18/2021 1033   HGB 13.6 11/30/2020 0930   HCT 38.5 01/18/2021 1033   HCT 41.5 11/30/2020 0930   PLT 112 (L) 01/18/2021 1033   PLT 129 (L) 11/30/2020 0930   MCV 103.2 (H) 01/18/2021 1033   MCV 99 (H) 11/30/2020 0930   MCH 33.5 01/18/2021 1033   MCHC 32.5 01/18/2021 1033   RDW 13.3 01/18/2021 1033   RDW 13.3 11/30/2020 0930   LYMPHSABS 1.0 01/18/2021 1033   LYMPHSABS 1.0  11/30/2020 0930   MONOABS 0.3 01/18/2021 1033   EOSABS 0.2 01/18/2021 1033   EOSABS 0.1 11/30/2020 0930   BASOSABS 0.0 01/18/2021 1033   BASOSABS 0.0 11/30/2020 0930   CMP Latest Ref Rng & Units 01/18/2021 11/30/2020 09/23/2020  Glucose 70 - 99 mg/dL 184(H) 123(H) 129(H)  BUN 8 - 23 mg/dL 19 16 13   Creatinine 0.44 - 1.00 mg/dL 0.79 0.82 0.83  Sodium 135 - 145 mmol/L 138 143 141  Potassium 3.5 - 5.1 mmol/L 3.7 3.7 4.5  Chloride 98 - 111 mmol/L 100 102 102  CO2 22 - 32 mmol/L 30 28 29   Calcium 8.9 - 10.3 mg/dL 9.3 10.3 9.4  Total Protein 6.5 - 8.1 g/dL 6.5 - -  Total Bilirubin 0.3 - 1.2 mg/dL 1.7(H) - -  Alkaline Phos 38 - 126 U/L 109 - -  AST 15 - 41 U/L 35 - -  ALT 0 - 44 U/L 25 - -    DIAGNOSTIC IMAGING:  I have independently reviewed the relevant imaging and discussed with the patient.  ASSESSMENT: 1. Iron deficiency anemia: -Colonoscopy on 04/08/2019 with normal ileum, moderate diverticulosis in the rectosigmoid colon, external and internal hemorrhoids. Pathology consistent with tubular adenomas. 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 Thrombocytopenia: -CT scan of the chest on 01/05/2017 at [facility] showed borderline splenomegaly. No comment on the liver. - Most recent labs (12/29/2020) show platelets relatively stable at 112   PLAN:  1. Iron deficiency anemia: - Reviewed labs from 01/18/2021, hemoglobin stable but ferritin stores depleted - Recommend weekly Feraheme x2 - Continue taking iron tablet every other day - Decrease B12 to every other day (B12 elevated at 1242)  -Macrocytosis noted with MCV 103, unable to exclude early MDS or occult liver disease at this time  2.  Thrombocytopenia: -Platelet count today is 112.  Differential diagnosis includes early myelodysplasia versus borderline splenomegaly. - Continue to monitor with repeat labs and visit in 4 months  3. Atrial fibrillation: -Continue Eliquis twice daily.   4.  Pulmonary nodule -Patient is following with PCP (Dr. Woody Seller)   PLAN SUMMARY & DISPOSITION: - IV Feraheme x2 - Labs and RTC in 6 months  All questions were answered. The patient knows to call the clinic with any problems, questions or concerns.  Medical decision making: Low  Time spent on visit: I spent 20 minutes counseling the patient face to face. The total time spent in the appointment was 30 minutes and more than 50% was on counseling.   Harriett Rush, PA-C  01/19/21 2:17 PM

## 2021-01-21 ENCOUNTER — Encounter (HOSPITAL_COMMUNITY): Payer: Self-pay

## 2021-01-21 ENCOUNTER — Inpatient Hospital Stay (HOSPITAL_COMMUNITY): Payer: Medicare Other

## 2021-01-21 ENCOUNTER — Other Ambulatory Visit: Payer: Self-pay

## 2021-01-21 VITALS — BP 131/67 | HR 66 | Temp 97.2°F | Resp 18

## 2021-01-21 DIAGNOSIS — Z87891 Personal history of nicotine dependence: Secondary | ICD-10-CM | POA: Diagnosis not present

## 2021-01-21 DIAGNOSIS — D500 Iron deficiency anemia secondary to blood loss (chronic): Secondary | ICD-10-CM

## 2021-01-21 DIAGNOSIS — Z7901 Long term (current) use of anticoagulants: Secondary | ICD-10-CM | POA: Diagnosis not present

## 2021-01-21 DIAGNOSIS — D509 Iron deficiency anemia, unspecified: Secondary | ICD-10-CM | POA: Diagnosis not present

## 2021-01-21 DIAGNOSIS — I4891 Unspecified atrial fibrillation: Secondary | ICD-10-CM | POA: Diagnosis not present

## 2021-01-21 DIAGNOSIS — D696 Thrombocytopenia, unspecified: Secondary | ICD-10-CM | POA: Diagnosis not present

## 2021-01-21 DIAGNOSIS — R911 Solitary pulmonary nodule: Secondary | ICD-10-CM | POA: Diagnosis not present

## 2021-01-21 DIAGNOSIS — E1165 Type 2 diabetes mellitus with hyperglycemia: Secondary | ICD-10-CM | POA: Diagnosis not present

## 2021-01-21 MED ORDER — SODIUM CHLORIDE 0.9 % IV SOLN
510.0000 mg | Freq: Once | INTRAVENOUS | Status: AC
  Administered 2021-01-21: 510 mg via INTRAVENOUS
  Filled 2021-01-21: qty 510

## 2021-01-21 MED ORDER — SODIUM CHLORIDE 0.9 % IV SOLN: Freq: Once | INTRAVENOUS | Status: AC

## 2021-01-21 MED ORDER — ACETAMINOPHEN 325 MG PO TABS
ORAL_TABLET | ORAL | Status: AC
  Filled 2021-01-21: qty 2

## 2021-01-21 MED ORDER — LORATADINE 10 MG PO TABS
ORAL_TABLET | ORAL | Status: AC
  Filled 2021-01-21: qty 1

## 2021-01-21 MED ORDER — ACETAMINOPHEN 325 MG PO TABS
650.0000 mg | ORAL_TABLET | Freq: Once | ORAL | Status: AC
  Administered 2021-01-21: 650 mg via ORAL

## 2021-01-21 MED ORDER — LORATADINE 10 MG PO TABS
10.0000 mg | ORAL_TABLET | Freq: Once | ORAL | Status: AC
Start: 2021-01-21 — End: 2021-01-21
  Administered 2021-01-21: 10 mg via ORAL

## 2021-01-21 NOTE — Patient Instructions (Signed)
Linda [name]     Discharge Instructions: Thank you for choosing [facility] to provide your oncology and hematology care.  If you have a lab appointment with the [facility], please come in thru the Main Entrance and check in at the main information desk.   We strive to give you quality time with your provider. You may need to reschedule your appointment if you arrive late (15 or more minutes).  Arriving late affects you and other patients whose appointments are after yours.  Also, if you miss three or more appointments without notifying the office, you may be dismissed from the clinic at the provider's discretion.      For prescription refill requests, have your pharmacy contact our office and allow 72 hours for refills to be completed.    Today you received Feraheme iron infusion. Return as scheduled for infusion. Return as scheduled for lab work and office visit.       BELOW ARE SYMPTOMS THAT SHOULD BE REPORTED IMMEDIATELY: . *FEVER GREATER THAN 100.4 F (38 C) OR HIGHER . *CHILLS OR SWEATING . *NAUSEA AND VOMITING THAT IS NOT CONTROLLED WITH YOUR NAUSEA MEDICATION . *UNUSUAL SHORTNESS OF BREATH . *UNUSUAL BRUISING OR BLEEDING . *URINARY PROBLEMS (pain or burning when urinating, or frequent urination) . *BOWEL PROBLEMS (unusual diarrhea, constipation, pain near the anus) . TENDERNESS IN MOUTH AND THROAT WITH OR WITHOUT PRESENCE OF ULCERS (sore throat, sores in mouth, or a toothache) . UNUSUAL RASH, SWELLING OR PAIN  . UNUSUAL VAGINAL DISCHARGE OR ITCHING   Items with * indicate a potential emergency and should be followed up as soon as possible or go to the Emergency Department if any problems should occur.  Should you have questions after your visit or need to cancel or reschedule your appointment, please contact [facility] [phone]  and follow the prompts.  Office hours are 8:00 a.m. to 4:30 p.m. Monday - Friday. Please note that  voicemails left after 4:00 p.m. may not be returned until the following business day.  We are closed weekends and major holidays. You have access to a nurse at all times for urgent questions. Please call the main number to the clinic [phone] and follow the prompts.  For any non-urgent questions, you may also contact your provider using MyChart. We now offer e-Visits for anyone 53 and older to request care online for non-urgent symptoms. For details visit [email or web address].   Also download the MyChart app! Go to the app store, search "MyChart", open the app, select [facility], and log in with your MyChart username and password.  Due to Covid, a mask is required upon entering the hospital/clinic. If you do not have a mask, one will be given to you upon arrival. For doctor visits, patients may have 1 support person aged 46 or older with them. For treatment visits, patients cannot have anyone with them due to current Covid guidelines and our immunocompromised population.

## 2021-01-21 NOTE — Progress Notes (Signed)
Tolerated infusion w/o adverse reaction.  Alert, in no distress.  VSS.  Discharged ambulatory in stable condition.  

## 2021-01-24 DIAGNOSIS — I4891 Unspecified atrial fibrillation: Secondary | ICD-10-CM | POA: Diagnosis not present

## 2021-01-24 DIAGNOSIS — J449 Chronic obstructive pulmonary disease, unspecified: Secondary | ICD-10-CM | POA: Diagnosis not present

## 2021-01-24 DIAGNOSIS — Z299 Encounter for prophylactic measures, unspecified: Secondary | ICD-10-CM | POA: Diagnosis not present

## 2021-01-24 DIAGNOSIS — E1165 Type 2 diabetes mellitus with hyperglycemia: Secondary | ICD-10-CM | POA: Diagnosis not present

## 2021-01-24 DIAGNOSIS — I10 Essential (primary) hypertension: Secondary | ICD-10-CM | POA: Diagnosis not present

## 2021-01-25 ENCOUNTER — Ambulatory Visit (HOSPITAL_COMMUNITY): Payer: Medicare Other | Admitting: Physician Assistant

## 2021-01-28 ENCOUNTER — Other Ambulatory Visit: Payer: Self-pay

## 2021-01-28 ENCOUNTER — Inpatient Hospital Stay (HOSPITAL_COMMUNITY): Payer: Medicare Other | Attending: Hematology

## 2021-01-28 VITALS — BP 126/58 | HR 65 | Temp 97.1°F | Resp 18

## 2021-01-28 DIAGNOSIS — D696 Thrombocytopenia, unspecified: Secondary | ICD-10-CM | POA: Diagnosis not present

## 2021-01-28 DIAGNOSIS — D509 Iron deficiency anemia, unspecified: Secondary | ICD-10-CM | POA: Diagnosis not present

## 2021-01-28 DIAGNOSIS — D500 Iron deficiency anemia secondary to blood loss (chronic): Secondary | ICD-10-CM

## 2021-01-28 DIAGNOSIS — Z87891 Personal history of nicotine dependence: Secondary | ICD-10-CM | POA: Diagnosis not present

## 2021-01-28 DIAGNOSIS — Z7901 Long term (current) use of anticoagulants: Secondary | ICD-10-CM | POA: Insufficient documentation

## 2021-01-28 DIAGNOSIS — R911 Solitary pulmonary nodule: Secondary | ICD-10-CM | POA: Insufficient documentation

## 2021-01-28 DIAGNOSIS — I4891 Unspecified atrial fibrillation: Secondary | ICD-10-CM | POA: Diagnosis not present

## 2021-01-28 MED ORDER — ACETAMINOPHEN 325 MG PO TABS
ORAL_TABLET | ORAL | Status: AC
  Filled 2021-01-28: qty 2

## 2021-01-28 MED ORDER — LORATADINE 10 MG PO TABS
10.0000 mg | ORAL_TABLET | Freq: Once | ORAL | Status: AC
  Administered 2021-01-28: 10 mg via ORAL

## 2021-01-28 MED ORDER — ACETAMINOPHEN 325 MG PO TABS
650.0000 mg | ORAL_TABLET | Freq: Once | ORAL | Status: AC
  Administered 2021-01-28: 650 mg via ORAL

## 2021-01-28 MED ORDER — SODIUM CHLORIDE 0.9 % IV SOLN
510.0000 mg | Freq: Once | INTRAVENOUS | Status: AC
  Administered 2021-01-28: 510 mg via INTRAVENOUS
  Filled 2021-01-28: qty 510

## 2021-01-28 MED ORDER — LORATADINE 10 MG PO TABS
ORAL_TABLET | ORAL | Status: AC
  Filled 2021-01-28: qty 1

## 2021-01-28 MED ORDER — SODIUM CHLORIDE 0.9 % IV SOLN: Freq: Once | INTRAVENOUS | Status: AC

## 2021-01-28 NOTE — Patient Instructions (Signed)
[facility]  Discharge Instructions: Thank you for choosing [facility] to provide your oncology and hematology care.  If you have a lab appointment with the Cancer Center, please come in thru the Main Entrance and check in at the main information desk.  Wear comfortable clothing and clothing appropriate for easy access to any Portacath or PICC line.   We strive to give you quality time with your provider. You may need to reschedule your appointment if you arrive late (15 or more minutes).  Arriving late affects you and other patients whose appointments are after yours.  Also, if you miss three or more appointments without notifying the office, you may be dismissed from the clinic at the provider's discretion.      For prescription refill requests, have your pharmacy contact our office and allow 72 hours for refills to be completed.         Items with * indicate a potential emergency and should be followed up as soon as possible or go to the Emergency Department if any problems should occur.    Should you have questions after your visit or need to cancel or reschedule your appointment, please contact [facility] [phone]  and follow the prompts.  Office hours are 8:00 a.m. to 4:30 p.m. Monday - Friday. Please note that voicemails left after 4:00 p.m. may not be returned until the following business day.  We are closed weekends and major holidays. You have access to a nurse at all times for urgent questions. Please call the main number to the clinic [phone] and follow the prompts.  For any non-urgent questions, you may also contact your provider using MyChart. We now offer e-Visits for anyone 18 and older to request care online for non-urgent symptoms. For details visit [email or web address].   Also download the MyChart app! Go to the app store, search "MyChart", open the app, select [facility], and log in with your MyChart username and  password.  Due to Covid, a mask is required upon entering the hospital/clinic. If you do not have a mask, one will be given to you upon arrival. For doctor visits, patients may have 1 support person aged 18 or older with them. For treatment visits, patients cannot have anyone with them due to current Covid guidelines and our immunocompromised population.  

## 2021-01-28 NOTE — Progress Notes (Signed)
Iron infusion given per orders. Patient tolerated it well without problems. Vitals stable and discharged home from clinic ambulatory. Follow up as scheduled.  

## 2021-02-03 NOTE — Progress Notes (Signed)
Carelink Summary Report / Loop Recorder 

## 2021-02-16 LAB — CUP PACEART REMOTE DEVICE CHECK
Date Time Interrogation Session: 20220524095913
Implantable Pulse Generator Implant Date: 20190212

## 2021-02-17 ENCOUNTER — Ambulatory Visit (INDEPENDENT_AMBULATORY_CARE_PROVIDER_SITE_OTHER): Payer: Medicare Other

## 2021-02-17 DIAGNOSIS — I4891 Unspecified atrial fibrillation: Secondary | ICD-10-CM | POA: Diagnosis not present

## 2021-02-21 DIAGNOSIS — I10 Essential (primary) hypertension: Secondary | ICD-10-CM | POA: Diagnosis not present

## 2021-02-21 DIAGNOSIS — E119 Type 2 diabetes mellitus without complications: Secondary | ICD-10-CM | POA: Diagnosis not present

## 2021-02-21 DIAGNOSIS — E7849 Other hyperlipidemia: Secondary | ICD-10-CM | POA: Diagnosis not present

## 2021-03-09 ENCOUNTER — Other Ambulatory Visit (HOSPITAL_COMMUNITY): Payer: Self-pay | Admitting: Internal Medicine

## 2021-03-09 DIAGNOSIS — Z1231 Encounter for screening mammogram for malignant neoplasm of breast: Secondary | ICD-10-CM

## 2021-03-14 ENCOUNTER — Encounter (HOSPITAL_COMMUNITY): Payer: Self-pay | Admitting: Hematology

## 2021-03-14 NOTE — Progress Notes (Signed)
Carelink Summary Report / Loop Recorder 

## 2021-03-19 LAB — CUP PACEART REMOTE DEVICE CHECK
Date Time Interrogation Session: 20220625041545
Implantable Pulse Generator Implant Date: 20190212

## 2021-03-21 ENCOUNTER — Ambulatory Visit (INDEPENDENT_AMBULATORY_CARE_PROVIDER_SITE_OTHER): Payer: Medicare Other

## 2021-03-21 DIAGNOSIS — I4891 Unspecified atrial fibrillation: Secondary | ICD-10-CM

## 2021-03-25 ENCOUNTER — Encounter: Payer: Self-pay | Admitting: Internal Medicine

## 2021-03-25 ENCOUNTER — Ambulatory Visit (INDEPENDENT_AMBULATORY_CARE_PROVIDER_SITE_OTHER): Payer: Medicare Other | Admitting: Internal Medicine

## 2021-03-25 VITALS — BP 124/68 | HR 66 | Ht 62.0 in | Wt 217.0 lb

## 2021-03-25 DIAGNOSIS — I4819 Other persistent atrial fibrillation: Secondary | ICD-10-CM

## 2021-03-25 DIAGNOSIS — G4733 Obstructive sleep apnea (adult) (pediatric): Secondary | ICD-10-CM

## 2021-03-25 DIAGNOSIS — D6869 Other thrombophilia: Secondary | ICD-10-CM | POA: Diagnosis not present

## 2021-03-25 MED ORDER — METOPROLOL SUCCINATE ER 25 MG PO TB24: 25.0000 mg | ORAL_TABLET | Freq: Every day | ORAL | 3 refills | Status: DC

## 2021-03-25 NOTE — Patient Instructions (Addendum)
Medication Instructions:  Decrease Toprol XL to 25mg  daily.  Continue all other medications.    Labwork: none  Testing/Procedures: none  Follow-Up: 6 months   Any Other Special Instructions Will Be Listed Below (If Applicable).  If you need a refill on your cardiac medications before your next appointment, please call your pharmacy.

## 2021-03-25 NOTE — Progress Notes (Signed)
PCP: [name], MD    Linda [name] is a 76 y.o. female who presents today for routine electrophysiology followup.  Since his recent afib ablation, the patient reports doing very well.  she denies procedure related complications and is pleased with the results of the procedure.  Today, she denies symptoms of palpitations, chest pain, shortness of breath,  lower extremity edema, dizziness, presyncope, or syncope.  The patient is otherwise without complaint today.   Past Medical History:  Diagnosis Date   Allergy    Anemia 07/25/2017   Anxiety    Arthritis    Atrial fibrillation (HCC)    Cataract    COPD (chronic obstructive pulmonary disease) (HCC)    Depression    Diabetes mellitus without complication (HCC)    Emphysema of lung (HCC)    GERD (gastroesophageal reflux disease) 10/28/2020   Hyperlipidemia    Hypertension    Oxygen deficiency    Sleep apnea    Typical atrial flutter ([place])    Past Surgical History:  Procedure Laterality Date   A-FLUTTER ABLATION N/A 10/04/2017   Procedure: A-FLUTTER ABLATION;  Surgeon: Thompson Grayer, MD;  Location: [facility] [facility];  Service: Cardiovascular;  Laterality: N/A;   ABDOMINAL HYSTERECTOMY     fibroids   ATRIAL FIBRILLATION ABLATION N/A 12/09/2020   Procedure: ATRIAL FIBRILLATION ABLATION;  Surgeon: Thompson Grayer, MD;  Location: [place] [facility];  Service: Cardiovascular;  Laterality: N/A;   blood clot removal from base of brain  1990   CARDIOVERSION N/A 10/04/2020   Procedure: CARDIOVERSION;  Surgeon: Freada Bergeron, MD;  Location: [place];  Service: Cardiovascular;  Laterality: N/A;   CARPAL TUNNEL RELEASE     CATARACT EXTRACTION W/PHACO Left 06/16/2013   Procedure: CATARACT EXTRACTION PHACO AND INTRAOCULAR LENS PLACEMENT ([place]);  Surgeon: Tonny Branch, MD;  Location: [facility];  Service: Ophthalmology;  Laterality: Left;  CDE:  12.18   CATARACT EXTRACTION W/PHACO Right 06/26/2013   Procedure: CATARACT EXTRACTION PHACO AND  INTRAOCULAR LENS PLACEMENT ([facility]);  Surgeon: Tonny Branch, MD;  Location: [facility];  Service: Ophthalmology;  Laterality: Right;  CDE:14.71   CESAREAN SECTION     CHOLECYSTECTOMY     COLONOSCOPY WITH PROPOFOL N/A 04/08/2019   Dr.Fields: diverticulosis, 11 simple adenomas removed, hemorrhoids. next colonoscopy in 3 years.    DILATION AND CURETTAGE OF UTERUS     ESOPHAGOGASTRODUODENOSCOPY (EGD) WITH PROPOFOL N/A 04/08/2019   Dr. Oneida Alar: H.pylori gastritis   EYE SURGERY  12/2016   FOOT NEUROMA SURGERY Right    LOOP RECORDER INSERTION N/A 11/06/2017   Procedure: LOOP RECORDER INSERTION;  Surgeon: Thompson Grayer, MD;  Location: [place] [facility];  Service: Cardiovascular;  Laterality: N/A;   MOUTH SURGERY     POLYPECTOMY  04/08/2019   Procedure: POLYPECTOMY;  Surgeon: Danie Binder, MD;  [facility];  Service: Endoscopy;;   TEE WITHOUT CARDIOVERSION N/A 12/09/2020   Procedure: TRANSESOPHAGEAL ECHOCARDIOGRAM (TEE);  Surgeon: Lelon Perla, MD;  Location: [facility];  Service: Cardiovascular;  Laterality: N/A;   TONSILLECTOMY      ROS- all systems are personally reviewed and negatives except as per HPI above  Current Outpatient Medications  Medication Sig Dispense Refill   acetaminophen (TYLENOL) 500 MG tablet Take 1,000 mg by mouth every 6 (six) hours as needed for moderate pain or headache.      ADVAIR DISKUS 250-50 MCG/DOSE AEPB Inhale 1 puff into the lungs 2 (two) times daily.     apixaban (  ELIQUIS) 5 MG TABS tablet Take 1 tablet (5 mg total) by mouth 2 (two) times daily. 180 tablet 3   Ascorbic Acid (VITAMIN C) 1000 MG tablet Take 1,000 mg by mouth daily.     calcium carbonate (TUMS - DOSED IN MG ELEMENTAL CALCIUM) 500 MG chewable tablet Chew 1 tablet by mouth daily as needed.     calcium-vitamin D (OSCAL WITH D) 500-200 MG-UNIT tablet Take 1 tablet by mouth daily.     cetirizine (ZYRTEC) 10 MG tablet Take 10 mg by mouth daily.     Cholecalciferol (VITAMIN D3 SUPER STRENGTH) 50  MCG (2000 UT) TABS Take 2,000 Units by mouth daily at 12 noon.     DULoxetine (CYMBALTA) 30 MG capsule Take 30 mg by mouth daily.     ferrous sulfate 325 (65 FE) MG EC tablet Take 325 mg by mouth every other day.     Melatonin 10 MG TABS Take 10 mg by mouth at bedtime.     metFORMIN (GLUCOPHAGE) 500 MG tablet Take 500 mg by mouth daily with breakfast.     metoprolol succinate (TOPROL-XL) 50 MG 24 hr tablet Take 25 mg in the morning and 25 mg in the evening. Take with or immediately following a meal. Only take the medication if the heart rate is >60bpm. 252 tablet 4   Multiple Vitamins-Minerals (ONE DAILY CALCIUM/IRON) TABS Take 1 tablet by mouth daily.     OXYGEN Inhale 2 L into the lungs at bedtime. With BPAP     pravastatin (PRAVACHOL) 20 MG tablet Take 1 tablet (20 mg total) daily by mouth. 90 tablet 3   PROAIR HFA 108 (90 Base) MCG/ACT inhaler Inhale 2 puffs into the lungs every 4 (four) hours as needed for wheezing or shortness of breath. Every 4-6 hours as needed for wheezing or sob     Probiotic Product (PROBIOTIC DAILY PO) Take 1 capsule by mouth daily.      vitamin B-12 (CYANOCOBALAMIN) 1000 MCG tablet Take 1,000 mcg by mouth daily.     No current facility-administered medications for this visit.    Physical Exam: Vitals:   03/25/21 1055  BP: 124/68  Pulse: 66  SpO2: 95%  Weight: 217 lb (98.4 kg)  Height: 5\' 2"  (1.575 m)    GEN- The patient is well appearing, alert and oriented x 3 today.   Head- normocephalic, atraumatic Eyes-  Sclera clear, conjunctiva pink Ears- hearing intact Oropharynx- clear Lungs- Clear to ausculation bilaterally, normal work of breathing Heart- Regular rate and rhythm, no murmurs, rubs or gallops, PMI not laterally displaced GI- soft, NT, ND, + BS Extremities- no clubbing, cyanosis, or edema    Assessment and Plan:  1. Persistent atrial fibrillation Doing well s/p ablation AF burden by ILR 0% (previously 69% pre ablation) chads2vasc score is  4.  She is on eliquis Reduce toprol to 25mg  daily today  2. Obesity Body mass index is 39.69 kg/m. We discussed lifestyle modification at length today  3. OSA Using BiPAP  4. HL Continue pravastatin 20mg  daily  Return to see me in 6 months  Thompson Grayer MD, [facility] 03/25/2021 11:36 AM

## 2021-04-07 NOTE — Progress Notes (Signed)
Carelink Summary Report / Loop Recorder 

## 2021-04-18 ENCOUNTER — Ambulatory Visit (HOSPITAL_COMMUNITY)
Admission: RE | Admit: 2021-04-18 | Discharge: 2021-04-18 | Disposition: A | Discharge status: Home / Self Care | Payer: Medicare Other | Source: Ambulatory Visit | Attending: Internal Medicine | Admitting: Internal Medicine

## 2021-04-18 ENCOUNTER — Other Ambulatory Visit: Payer: Self-pay

## 2021-04-18 DIAGNOSIS — Z1231 Encounter for screening mammogram for malignant neoplasm of breast: Secondary | ICD-10-CM | POA: Diagnosis not present

## 2021-04-21 ENCOUNTER — Ambulatory Visit (INDEPENDENT_AMBULATORY_CARE_PROVIDER_SITE_OTHER): Payer: Medicare Other

## 2021-04-21 DIAGNOSIS — I4892 Unspecified atrial flutter: Secondary | ICD-10-CM

## 2021-04-22 LAB — CUP PACEART REMOTE DEVICE CHECK
Date Time Interrogation Session: 20220726042515
Implantable Pulse Generator Implant Date: 20190212

## 2021-04-24 DIAGNOSIS — E7849 Other hyperlipidemia: Secondary | ICD-10-CM | POA: Diagnosis not present

## 2021-04-24 DIAGNOSIS — E119 Type 2 diabetes mellitus without complications: Secondary | ICD-10-CM | POA: Diagnosis not present

## 2021-04-24 DIAGNOSIS — I10 Essential (primary) hypertension: Secondary | ICD-10-CM | POA: Diagnosis not present

## 2021-05-02 DIAGNOSIS — J449 Chronic obstructive pulmonary disease, unspecified: Secondary | ICD-10-CM | POA: Diagnosis not present

## 2021-05-02 DIAGNOSIS — I4891 Unspecified atrial fibrillation: Secondary | ICD-10-CM | POA: Diagnosis not present

## 2021-05-02 DIAGNOSIS — Z299 Encounter for prophylactic measures, unspecified: Secondary | ICD-10-CM | POA: Diagnosis not present

## 2021-05-02 DIAGNOSIS — E1165 Type 2 diabetes mellitus with hyperglycemia: Secondary | ICD-10-CM | POA: Diagnosis not present

## 2021-05-02 DIAGNOSIS — I10 Essential (primary) hypertension: Secondary | ICD-10-CM | POA: Diagnosis not present

## 2021-05-03 DIAGNOSIS — L821 Other seborrheic keratosis: Secondary | ICD-10-CM | POA: Diagnosis not present

## 2021-05-03 DIAGNOSIS — D1801 Hemangioma of skin and subcutaneous tissue: Secondary | ICD-10-CM | POA: Diagnosis not present

## 2021-05-03 DIAGNOSIS — D692 Other nonthrombocytopenic purpura: Secondary | ICD-10-CM | POA: Diagnosis not present

## 2021-05-18 NOTE — Progress Notes (Signed)
Carelink Summary Report / Loop Recorder 

## 2021-05-23 ENCOUNTER — Ambulatory Visit (INDEPENDENT_AMBULATORY_CARE_PROVIDER_SITE_OTHER): Payer: Medicare Other

## 2021-05-23 DIAGNOSIS — I4819 Other persistent atrial fibrillation: Secondary | ICD-10-CM

## 2021-05-23 LAB — CUP PACEART REMOTE DEVICE CHECK
Date Time Interrogation Session: 20220826043512
Implantable Pulse Generator Implant Date: 20190212

## 2021-06-03 NOTE — Progress Notes (Signed)
Carelink Summary Report / Loop Recorder 

## 2021-06-22 DIAGNOSIS — L720 Epidermal cyst: Secondary | ICD-10-CM | POA: Diagnosis not present

## 2021-06-22 DIAGNOSIS — L02219 Cutaneous abscess of trunk, unspecified: Secondary | ICD-10-CM | POA: Diagnosis not present

## 2021-06-22 DIAGNOSIS — L304 Erythema intertrigo: Secondary | ICD-10-CM | POA: Diagnosis not present

## 2021-06-23 ENCOUNTER — Ambulatory Visit (INDEPENDENT_AMBULATORY_CARE_PROVIDER_SITE_OTHER): Payer: Medicare Other

## 2021-06-23 DIAGNOSIS — I4819 Other persistent atrial fibrillation: Secondary | ICD-10-CM

## 2021-06-23 LAB — CUP PACEART REMOTE DEVICE CHECK
Date Time Interrogation Session: 20220926043637
Implantable Pulse Generator Implant Date: 20190212

## 2021-06-24 DIAGNOSIS — I10 Essential (primary) hypertension: Secondary | ICD-10-CM | POA: Diagnosis not present

## 2021-06-24 DIAGNOSIS — E119 Type 2 diabetes mellitus without complications: Secondary | ICD-10-CM | POA: Diagnosis not present

## 2021-06-30 NOTE — Progress Notes (Signed)
Carelink Summary Report / Loop Recorder 

## 2021-07-01 ENCOUNTER — Encounter: Payer: Medicare Other | Admitting: Internal Medicine

## 2021-07-15 ENCOUNTER — Other Ambulatory Visit: Payer: Self-pay

## 2021-07-15 ENCOUNTER — Inpatient Hospital Stay (HOSPITAL_COMMUNITY): Payer: Medicare Other | Attending: Hematology

## 2021-07-15 DIAGNOSIS — R911 Solitary pulmonary nodule: Secondary | ICD-10-CM | POA: Diagnosis not present

## 2021-07-15 DIAGNOSIS — Z7984 Long term (current) use of oral hypoglycemic drugs: Secondary | ICD-10-CM | POA: Insufficient documentation

## 2021-07-15 DIAGNOSIS — Z7901 Long term (current) use of anticoagulants: Secondary | ICD-10-CM | POA: Diagnosis not present

## 2021-07-15 DIAGNOSIS — Z79899 Other long term (current) drug therapy: Secondary | ICD-10-CM | POA: Diagnosis not present

## 2021-07-15 DIAGNOSIS — D696 Thrombocytopenia, unspecified: Secondary | ICD-10-CM | POA: Diagnosis not present

## 2021-07-15 DIAGNOSIS — I4891 Unspecified atrial fibrillation: Secondary | ICD-10-CM | POA: Insufficient documentation

## 2021-07-15 DIAGNOSIS — Z7951 Long term (current) use of inhaled steroids: Secondary | ICD-10-CM | POA: Diagnosis not present

## 2021-07-15 DIAGNOSIS — D509 Iron deficiency anemia, unspecified: Secondary | ICD-10-CM | POA: Insufficient documentation

## 2021-07-15 DIAGNOSIS — E538 Deficiency of other specified B group vitamins: Secondary | ICD-10-CM

## 2021-07-15 DIAGNOSIS — D500 Iron deficiency anemia secondary to blood loss (chronic): Secondary | ICD-10-CM

## 2021-07-15 DIAGNOSIS — F32A Depression, unspecified: Secondary | ICD-10-CM | POA: Diagnosis not present

## 2021-07-15 LAB — IRON AND TIBC
Iron: 155 ug/dL (ref 28–170)
Saturation Ratios: 37 % — ABNORMAL HIGH (ref 10.4–31.8)
TIBC: 415 ug/dL (ref 250–450)
UIBC: 260 ug/dL

## 2021-07-15 LAB — CBC WITH DIFFERENTIAL/PLATELET
Abs Immature Granulocytes: 0.01 10*3/uL (ref 0.00–0.07)
Basophils Absolute: 0 10*3/uL (ref 0.0–0.1)
Basophils Relative: 0 %
Eosinophils Absolute: 0.1 10*3/uL (ref 0.0–0.5)
Eosinophils Relative: 3 %
HCT: 39.1 % (ref 36.0–46.0)
Hemoglobin: 12.4 g/dL (ref 12.0–15.0)
Immature Granulocytes: 0 %
Lymphocytes Relative: 18 %
Lymphs Abs: 0.9 10*3/uL (ref 0.7–4.0)
MCH: 33.2 pg (ref 26.0–34.0)
MCHC: 31.7 g/dL (ref 30.0–36.0)
MCV: 104.5 fL — ABNORMAL HIGH (ref 80.0–100.0)
Monocytes Absolute: 0.4 10*3/uL (ref 0.1–1.0)
Monocytes Relative: 8 %
Neutro Abs: 3.4 10*3/uL (ref 1.7–7.7)
Neutrophils Relative %: 71 %
Platelets: 117 10*3/uL — ABNORMAL LOW (ref 150–400)
RBC: 3.74 MIL/uL — ABNORMAL LOW (ref 3.87–5.11)
RDW: 14.1 % (ref 11.5–15.5)
WBC: 4.8 10*3/uL (ref 4.0–10.5)
nRBC: 0 % (ref 0.0–0.2)

## 2021-07-15 LAB — COMPREHENSIVE METABOLIC PANEL
ALT: 27 U/L (ref 0–44)
AST: 34 U/L (ref 15–41)
Albumin: 3.7 g/dL (ref 3.5–5.0)
Alkaline Phosphatase: 103 U/L (ref 38–126)
Anion gap: 8 (ref 5–15)
BUN: 15 mg/dL (ref 8–23)
CO2: 30 mmol/L (ref 22–32)
Calcium: 9.7 mg/dL (ref 8.9–10.3)
Chloride: 102 mmol/L (ref 98–111)
Creatinine, Ser: 0.84 mg/dL (ref 0.44–1.00)
GFR, Estimated: >60 mL/min (ref >60)
Glucose, Bld: 180 mg/dL — ABNORMAL HIGH (ref 70–99)
Potassium: 4.2 mmol/L (ref 3.5–5.1)
Sodium: 140 mmol/L (ref 135–145)
Total Bilirubin: 1.7 mg/dL — ABNORMAL HIGH (ref 0.3–1.2)
Total Protein: 6.9 g/dL (ref 6.5–8.1)

## 2021-07-15 LAB — VITAMIN B12: Vitamin B-12: 1604 pg/mL — ABNORMAL HIGH (ref 180–914)

## 2021-07-15 LAB — LACTATE DEHYDROGENASE: LDH: 152 U/L (ref 98–192)

## 2021-07-15 LAB — FERRITIN: Ferritin: 28 ng/mL (ref 11–307)

## 2021-07-21 LAB — CUP PACEART REMOTE DEVICE CHECK
Date Time Interrogation Session: 20221027044020
Implantable Pulse Generator Implant Date: 20190212

## 2021-07-22 ENCOUNTER — Inpatient Hospital Stay (HOSPITAL_BASED_OUTPATIENT_CLINIC_OR_DEPARTMENT_OTHER): Payer: Medicare Other | Admitting: Physician Assistant

## 2021-07-22 ENCOUNTER — Other Ambulatory Visit: Payer: Self-pay

## 2021-07-22 VITALS — BP 144/71 | HR 84 | Temp 98.5°F | Resp 18 | Wt 220.6 lb

## 2021-07-22 DIAGNOSIS — D696 Thrombocytopenia, unspecified: Secondary | ICD-10-CM | POA: Diagnosis not present

## 2021-07-22 DIAGNOSIS — D500 Iron deficiency anemia secondary to blood loss (chronic): Secondary | ICD-10-CM | POA: Diagnosis not present

## 2021-07-22 DIAGNOSIS — F32A Depression, unspecified: Secondary | ICD-10-CM | POA: Diagnosis not present

## 2021-07-22 DIAGNOSIS — D509 Iron deficiency anemia, unspecified: Secondary | ICD-10-CM | POA: Diagnosis not present

## 2021-07-22 DIAGNOSIS — Z7901 Long term (current) use of anticoagulants: Secondary | ICD-10-CM | POA: Diagnosis not present

## 2021-07-22 DIAGNOSIS — I4891 Unspecified atrial fibrillation: Secondary | ICD-10-CM | POA: Diagnosis not present

## 2021-07-22 DIAGNOSIS — R911 Solitary pulmonary nodule: Secondary | ICD-10-CM | POA: Diagnosis not present

## 2021-07-22 NOTE — Progress Notes (Signed)
Linda [name], [place] [address]   CLINIC:  Medical Oncology/Hematology  PCP:  [name], MD [address] [place] [address] [phone]   REASON FOR VISIT:  Follow-up for iron-deficiency anemia   CURRENT THERAPY: Intermittent IV iron (last given 01/28/2021)   INTERVAL HISTORY:  Linda [name] 75 y.o. female returns for routine follow-up of her iron deficiency anemia. She was last seen by Tarri Abernethy PA-C on 01/19/2021.  Her last IV iron was IV Feraheme on 01/21/2021 and 01/28/2021.  At today's visit, she reports feeling somewhat poorly.  No recent hospitalizations, surgeries, or changes in baseline health status.  Her chief complaint today is ongoing depression.  She reports that she is feeling very distressed about her relationship with her son who has not spoken to her in 5 years.  She states that she "just needed to talk it out of it," but denies any suicidal ideation or intent.  She does not have any thoughts of self-harm and states that she will discuss this with her primary care provider at their upcoming visit next month.    She denies any abnormal bleeding events such as epistaxis, hematemesis, hematochezia, or melena.  She remains on Eliquis for atrial fibrillation and does report easy bruising but without petechiae.  She reports intermittent fatigue.  She has been having pica cravings for ice chips as well as dyspnea on exertion and lightheadedness.  She denies any restless legs, chest pain, or syncopal episodes.  No B symptoms such as fever, chills, night sweats, unintentional weight loss.  She continues to take iron tablet and B12 every other day.  She has 50% energy and 100% appetite. She endorses that she is maintaining a stable weight.   REVIEW OF SYSTEMS:  Review of Systems  Constitutional:  Positive for fatigue. Negative for appetite change, chills, diaphoresis, fever and unexpected weight change.  HENT:   Negative for lump/mass  and nosebleeds.   Eyes:  Negative for eye problems.  Respiratory:  Positive for shortness of breath (With exertion). Negative for cough and hemoptysis.   Cardiovascular:  Negative for chest pain, leg swelling and palpitations.  Gastrointestinal:  Negative for abdominal pain, blood in stool, constipation, diarrhea, nausea and vomiting.  Genitourinary:  Negative for hematuria.   Skin: Negative.   Neurological:  Positive for dizziness (Positional vertigo) and numbness (Bilateral hands and feet). Negative for headaches and light-headedness.  Hematological:  Does not bruise/bleed easily.  Psychiatric/Behavioral:  Positive for depression and sleep disturbance. The patient is nervous/anxious.      PAST MEDICAL/SURGICAL HISTORY:  Past Medical History:  Diagnosis Date   Allergy    Anemia 07/25/2017   Anxiety    Arthritis    Atrial fibrillation (HCC)    Cataract    COPD (chronic obstructive pulmonary disease) ([place])    Depression    Diabetes mellitus without complication (HCC)    Emphysema of lung (HCC)    GERD (gastroesophageal reflux disease) 10/28/2020   Hyperlipidemia    Hypertension    Oxygen deficiency    Sleep apnea    Typical atrial flutter ([place])    Past Surgical History:  Procedure Laterality Date   A-FLUTTER ABLATION N/A 10/04/2017   Procedure: A-FLUTTER ABLATION;  Surgeon: Thompson Grayer, MD;  Location: [place] [facility];  Service: Cardiovascular;  Laterality: N/A;   ABDOMINAL HYSTERECTOMY     fibroids   ATRIAL FIBRILLATION ABLATION N/A 12/09/2020   Procedure: ATRIAL FIBRILLATION ABLATION;  Surgeon: Rayann Heman,  Jeneen Rinks, MD;  Location: [place] [facility];  Service: Cardiovascular;  Laterality: N/A;   blood clot removal from base of brain  1990   CARDIOVERSION N/A 10/04/2020   Procedure: CARDIOVERSION;  Surgeon: Freada Bergeron, MD;  Location: [place];  Service: Cardiovascular;  Laterality: N/A;   CARPAL TUNNEL RELEASE     CATARACT EXTRACTION W/PHACO Left 06/16/2013    Procedure: CATARACT EXTRACTION PHACO AND INTRAOCULAR LENS PLACEMENT ([place]);  Surgeon: Tonny Branch, MD;  Location: [facility];  Service: Ophthalmology;  Laterality: Left;  CDE:  12.18   CATARACT EXTRACTION W/PHACO Right 06/26/2013   Procedure: CATARACT EXTRACTION PHACO AND INTRAOCULAR LENS PLACEMENT ([facility]);  Surgeon: Tonny Branch, MD;  Location: [facility];  Service: Ophthalmology;  Laterality: Right;  CDE:14.71   CESAREAN SECTION     CHOLECYSTECTOMY     COLONOSCOPY WITH PROPOFOL N/A 04/08/2019   Dr.Fields: diverticulosis, 11 simple adenomas removed, hemorrhoids. next colonoscopy in 3 years.    DILATION AND CURETTAGE OF UTERUS     ESOPHAGOGASTRODUODENOSCOPY (EGD) WITH PROPOFOL N/A 04/08/2019   Dr. Oneida Alar: H.pylori gastritis   EYE SURGERY  12/2016   FOOT NEUROMA SURGERY Right    LOOP RECORDER INSERTION N/A 11/06/2017   Procedure: LOOP RECORDER INSERTION;  Surgeon: Thompson Grayer, MD;  Location: [place] [facility];  Service: Cardiovascular;  Laterality: N/A;   MOUTH SURGERY     POLYPECTOMY  04/08/2019   Procedure: POLYPECTOMY;  Surgeon: Danie Binder, MD;  [facility];  Service: Endoscopy;;   TEE WITHOUT CARDIOVERSION N/A 12/09/2020   Procedure: TRANSESOPHAGEAL ECHOCARDIOGRAM (TEE);  Surgeon: Lelon Perla, MD;  Location: [facility];  Service: Cardiovascular;  Laterality: N/A;   TONSILLECTOMY       SOCIAL HISTORY:  Social History   Socioeconomic History   Marital status: Widowed    Spouse name: Not on file   Number of children: 1   Years of education: 28   Highest education level: Not on file  Occupational History   Occupation: retired    Comment: catering, Engineer, maintenance  Tobacco Use   Smoking status: Former    Packs/day: 1.50    Types: Cigarettes    Start date: 09/25/1958    Quit date: 06/06/2001    Years since quitting: 20.1   Smokeless tobacco: Never  Vaping Use   Vaping Use: Never used  Substance and Sexual Activity   Alcohol use: No   Drug use: No   Sexual activity: Not  Currently  Other Topics Concern   Not on file  Social History Narrative   Lives alone   Son lives in [place]   TV, reads, puzzles   Social Determinants of Health   Financial Resource Strain: Not on file  Food Insecurity: Not on file  Transportation Needs: Not on file  Physical Activity: Not on file  Stress: Not on file  Social Connections: Not on file  Intimate Partner Violence: Not on file    FAMILY HISTORY:  Family History  Problem Relation Age of Onset   Emphysema Mother    Alcohol abuse Mother    Arthritis Mother    COPD Mother    Depression Mother    Hyperlipidemia Mother    Hypertension Mother    Heart attack Father    Cancer Father        lung   Heart disease Father    Diabetes Brother    COPD Brother    Other Maternal Grandmother        swine  flu 1919   Colon cancer Neg Hx    Colon polyps Neg Hx     CURRENT MEDICATIONS:  Outpatient Encounter Medications as of 07/22/2021  Medication Sig   acetaminophen (TYLENOL) 500 MG tablet Take 1,000 mg by mouth every 6 (six) hours as needed for moderate pain or headache.    ADVAIR DISKUS 250-50 MCG/DOSE AEPB Inhale 1 puff into the lungs 2 (two) times daily.   apixaban (ELIQUIS) 5 MG TABS tablet Take 1 tablet (5 mg total) by mouth 2 (two) times daily.   Ascorbic Acid (VITAMIN C) 1000 MG tablet Take 1,000 mg by mouth daily.   calcium carbonate (TUMS - DOSED IN MG ELEMENTAL CALCIUM) 500 MG chewable tablet Chew 1 tablet by mouth daily as needed.   calcium-vitamin D (OSCAL WITH D) 500-200 MG-UNIT tablet Take 1 tablet by mouth daily.   cetirizine (ZYRTEC) 10 MG tablet Take 10 mg by mouth daily.   Cholecalciferol (VITAMIN D3 SUPER STRENGTH) 50 MCG (2000 UT) TABS Take 2,000 Units by mouth daily at 12 noon.   DULoxetine (CYMBALTA) 30 MG capsule Take 30 mg by mouth daily.   ferrous sulfate 325 (65 FE) MG EC tablet Take 325 mg by mouth every other day.   Melatonin 10 MG TABS Take 10 mg by mouth at bedtime.   metFORMIN  (GLUCOPHAGE) 500 MG tablet Take 500 mg by mouth daily with breakfast.   metoprolol succinate (TOPROL-XL) 25 MG 24 hr tablet Take 1 tablet (25 mg total) by mouth daily. Dose decreased 03/25/2021 - PLACE ON FILE - PATIENT WILL CALL WHEN SHE NEEDS   Multiple Vitamins-Minerals (ONE DAILY CALCIUM/IRON) TABS Take 1 tablet by mouth daily.   OXYGEN Inhale 2 L into the lungs at bedtime. With BPAP   pravastatin (PRAVACHOL) 20 MG tablet Take 1 tablet (20 mg total) daily by mouth.   PROAIR HFA 108 (90 Base) MCG/ACT inhaler Inhale 2 puffs into the lungs every 4 (four) hours as needed for wheezing or shortness of breath. Every 4-6 hours as needed for wheezing or sob   Probiotic Product (PROBIOTIC DAILY PO) Take 1 capsule by mouth daily.    vitamin B-12 (CYANOCOBALAMIN) 1000 MCG tablet Take 1,000 mcg by mouth daily.   No facility-administered encounter medications on file as of 07/22/2021.    ALLERGIES:  Allergies  Allergen Reactions   Trazodone And Nefazodone Hives and Other (See Comments)    "blisters my skin"    Benicar [Olmesartan]     Pt does not remember    Nickel Other (See Comments)    "oozing"   Tetanus Toxoids Other (See Comments)    Left a knot   Adhesive [Tape] Itching   Bactrim [Sulfamethoxazole-Trimethoprim] Itching and Rash   Penicillins Itching, Rash and Other (See Comments)    Has patient had a PCN reaction causing immediate rash, facial/tongue/throat swelling, SOB or lightheadedness with hypotension: Unknown Has patient had a PCN reaction causing severe rash involving mucus membranes or skin necrosis: Unknown Has patient had a PCN reaction that required hospitalization: No Has patient had a PCN reaction occurring within the last 10 years: No If all of the above answers are "NO", then may proceed with Cephalosporin use   Ppd [Tuberculin Purified Protein Derivative] Swelling and Other (See Comments)    Local arm swelling   Sulfamethoxazole-Trimethoprim Itching and Rash      PHYSICAL EXAM:  ECOG PERFORMANCE STATUS: 1 - Symptomatic but completely ambulatory  There were no vitals filed for this visit. There were no  vitals filed for this visit. Physical Exam Constitutional:      Appearance: Normal appearance. She is obese.  HENT:     Head: Normocephalic and atraumatic.     Mouth/Throat:     Mouth: Mucous membranes are moist.  Eyes:     Extraocular Movements: Extraocular movements intact.     Pupils: Pupils are equal, round, and reactive to light.  Cardiovascular:     Rate and Rhythm: Normal rate and regular rhythm.     Pulses: Normal pulses.     Heart sounds: Normal heart sounds.  Pulmonary:     Effort: Pulmonary effort is normal.     Breath sounds: Normal breath sounds.  Abdominal:     General: Abdomen is protuberant. Bowel sounds are normal.     Palpations: Abdomen is soft. There is no hepatomegaly or splenomegaly.     Tenderness: There is no abdominal tenderness.  Musculoskeletal:        General: No swelling.     Right lower leg: No edema.     Left lower leg: No edema.  Lymphadenopathy:     Cervical: No cervical adenopathy.  Skin:    General: Skin is warm and dry.  Neurological:     General: No focal deficit present.     Mental Status: She is alert and oriented to person, place, and time.  Psychiatric:        Mood and Affect: Mood normal.        Behavior: Behavior normal.     LABORATORY DATA:  I have reviewed the labs as listed.  CBC    Component Value Date/Time   WBC 4.8 07/15/2021 1032   RBC 3.74 (L) 07/15/2021 1032   HGB 12.4 07/15/2021 1032   HGB 13.6 11/30/2020 0930   HCT 39.1 07/15/2021 1032   HCT 41.5 11/30/2020 0930   PLT 117 (L) 07/15/2021 1032   PLT 129 (L) 11/30/2020 0930   MCV 104.5 (H) 07/15/2021 1032   MCV 99 (H) 11/30/2020 0930   MCH 33.2 07/15/2021 1032   MCHC 31.7 07/15/2021 1032   RDW 14.1 07/15/2021 1032   RDW 13.3 11/30/2020 0930   LYMPHSABS 0.9 07/15/2021 1032   LYMPHSABS 1.0 11/30/2020 0930    MONOABS 0.4 07/15/2021 1032   EOSABS 0.1 07/15/2021 1032   EOSABS 0.1 11/30/2020 0930   BASOSABS 0.0 07/15/2021 1032   BASOSABS 0.0 11/30/2020 0930   CMP Latest Ref Rng & Units 07/15/2021 01/18/2021 11/30/2020  Glucose 70 - 99 mg/dL 180(H) 184(H) 123(H)  BUN 8 - 23 mg/dL '15 19 16  ' Creatinine 0.44 - 1.00 mg/dL 0.84 0.79 0.82  Sodium 135 - 145 mmol/L 140 138 143  Potassium 3.5 - 5.1 mmol/L 4.2 3.7 3.7  Chloride 98 - 111 mmol/L 102 100 102  CO2 22 - 32 mmol/L '30 30 28  ' Calcium 8.9 - 10.3 mg/dL 9.7 9.3 10.3  Total Protein 6.5 - 8.1 g/dL 6.9 6.5 -  Total Bilirubin 0.3 - 1.2 mg/dL 1.7(H) 1.7(H) -  Alkaline Phos 38 - 126 U/L 103 109 -  AST 15 - 41 U/L 34 35 -  ALT 0 - 44 U/L 27 25 -    DIAGNOSTIC IMAGING:  I have independently reviewed the relevant imaging and discussed with the patient.  ASSESSMENT & PLAN: 1.  Iron deficiency anemia: - Colonoscopy on 04/08/2019 with normal ileum, moderate diverticulosis in the rectosigmoid colon, external and internal hemorrhoids.  Pathology consistent with tubular adenomas. 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  Thrombocytopenia & macrocytosis: - Mild thrombocytopenia since 2018 - CT scan of the chest on 01/05/2017 [facility] showed borderline splenomegaly.  No comment on the liver. - CBC on 09/23/2020 did note some platelet clumping could be skewing the counts - Most recent labs (07/15/2021) show platelets relatively stable at 117; MCV 104.5.  Vitamin B12 elevated at 1604, normal LDH.  Folate normal when checked in April  2022. - She admits to easy bruising but denies any petechial rash or abnormal bleeding events. - Differential diagnosis favors borderline splenomegaly with occult liver disease versus early myelodysplasia - PLAN: No indication for treatment at this time. - We will check abdominal ultrasound to rule out progressive splenomegaly or occult liver disease - we will call patient with results via phone visit - Continue to monitor with repeat CBC, B12, folate at follow-up visit in 6 months. - If any significant deviation from baseline, would consider bone marrow biopsy. - Patient can continue taking vitamin B12 every other day, elevated B12 may be false elevation as acute phase reactant.  3.  Atrial fibrillation: -Continue Eliquis twice daily.    4.  Pulmonary nodule -Patient is following with PCP (Dr. Woody Seller)   PLAN SUMMARY & DISPOSITION: -Abdominal ultrasound - Phone visit after ultrasound to discuss results - IV Feraheme x2 - Repeat labs (CBC, iron/TIBC, ferritin, B12, methylmalonic acid, folate) and RTC in 6 months  All questions were answered. The patient knows to call the clinic with any problems, questions or concerns.  Medical decision making: Moderate  Time spent on visit: I spent 20 minutes counseling the patient face to face. The total time spent in the appointment was 30 minutes and more than 50% was on counseling.   Harriett Rush, PA-C  07/22/2021 6:47 PM

## 2021-07-22 NOTE — Patient Instructions (Signed)
[place] at [facility] Discharge Instructions  You were seen today by Tarri Abernethy PA-C for your iron deficiency and your low platelets.  IRON DEFICIENCY: Even though your iron levels are a bit low, your hemoglobin/red blood cells are normal.  Nonetheless, your low iron levels could be causing some of your fatigue and can also place you at risk for developing anemia in the future.  Therefore, we will order IV iron x2 treatments.  LOW PLATELETS: The cause of your low platelets is unclear at this time, but it may be that you have some underlying abnormalities in your liver and spleen that is causing this.  We will check an ultrasound of your abdomen and we willl call you to discuss these results.  If any abnormalities of your liver or spleen are found (such as fatty liver infiltration or enlarged spleen), this would explain why your platelets are low.  If any abnormalities are found, we will refer you to gastroenterologist (stomach and liver doctor) for further testing and treatment.  LABS: Return in 6 months for repeat labs  OTHER TESTS: Abdominal ultrasound  MEDICATIONS: Continue every other day iron and B12  FOLLOW-UP APPOINTMENT: Office visit in 6 months, after labs   Thank you for choosing [place] at [facility] to provide your oncology and hematology care.  To afford each patient quality time with our provider, please arrive at least 15 minutes before your scheduled appointment time.   If you have a lab appointment with the [facility] please come in thru the Main Entrance and check in at the main information desk.  You need to re-schedule your appointment should you arrive 10 or more minutes late.  We strive to give you quality time with our providers, and arriving late affects you and other patients whose appointments are after yours.  Also, if you no show three or more times for appointments you may be dismissed from the clinic  at the providers discretion.     Again, thank you for choosing [facility].  Our hope is that these requests will decrease the amount of time that you wait before being seen by our physicians.       _____________________________________________________________  Should you have questions after your visit to [facility], please contact our office at [phone] and follow the prompts.  Our office hours are 8:00 a.m. and 4:30 p.m. Monday - Friday.  Please note that voicemails left after 4:00 p.m. may not be returned until the following business day.  We are closed weekends and major holidays.  You do have access to a nurse 24-7, just call the main number to the clinic [phone] and do not press any options, hold on the line and a nurse will answer the phone.    For prescription refill requests, have your pharmacy contact our office and allow 72 hours.    Due to Covid, you will need to wear a mask upon entering the hospital. If you do not have a mask, a mask will be given to you at the Main Entrance upon arrival. For doctor visits, patients may have 1 support person age 7 or older with them. For treatment visits, patients can not have anyone with them due to social distancing guidelines and our immunocompromised population.

## 2021-07-25 ENCOUNTER — Ambulatory Visit (INDEPENDENT_AMBULATORY_CARE_PROVIDER_SITE_OTHER): Payer: Medicare Other

## 2021-07-25 DIAGNOSIS — E119 Type 2 diabetes mellitus without complications: Secondary | ICD-10-CM | POA: Diagnosis not present

## 2021-07-25 DIAGNOSIS — I10 Essential (primary) hypertension: Secondary | ICD-10-CM | POA: Diagnosis not present

## 2021-07-25 DIAGNOSIS — I4819 Other persistent atrial fibrillation: Secondary | ICD-10-CM

## 2021-07-25 DIAGNOSIS — E7849 Other hyperlipidemia: Secondary | ICD-10-CM | POA: Diagnosis not present

## 2021-07-27 ENCOUNTER — Inpatient Hospital Stay (HOSPITAL_COMMUNITY): Payer: Medicare Other | Attending: Hematology

## 2021-07-27 ENCOUNTER — Other Ambulatory Visit: Payer: Self-pay

## 2021-07-27 VITALS — BP 132/82 | HR 61 | Temp 97.1°F | Resp 20

## 2021-07-27 DIAGNOSIS — D500 Iron deficiency anemia secondary to blood loss (chronic): Secondary | ICD-10-CM

## 2021-07-27 DIAGNOSIS — D509 Iron deficiency anemia, unspecified: Secondary | ICD-10-CM | POA: Diagnosis not present

## 2021-07-27 DIAGNOSIS — D696 Thrombocytopenia, unspecified: Secondary | ICD-10-CM | POA: Insufficient documentation

## 2021-07-27 MED ORDER — SODIUM CHLORIDE 0.9 % IV SOLN: Freq: Once | INTRAVENOUS | Status: AC

## 2021-07-27 MED ORDER — ACETAMINOPHEN 325 MG PO TABS
650.0000 mg | ORAL_TABLET | Freq: Once | ORAL | Status: AC
  Administered 2021-07-27: 650 mg via ORAL
  Filled 2021-07-27: qty 2

## 2021-07-27 MED ORDER — LORATADINE 10 MG PO TABS
10.0000 mg | ORAL_TABLET | Freq: Once | ORAL | Status: AC
  Administered 2021-07-27: 10 mg via ORAL
  Filled 2021-07-27: qty 1

## 2021-07-27 MED ORDER — SODIUM CHLORIDE 0.9 % IV SOLN
510.0000 mg | Freq: Once | INTRAVENOUS | Status: AC
  Administered 2021-07-27: 510 mg via INTRAVENOUS
  Filled 2021-07-27: qty 510

## 2021-07-27 NOTE — Patient Instructions (Signed)
[place]  Discharge Instructions: Thank you for choosing [facility] to provide your oncology and hematology care.  If you have a lab appointment with the [facility], please come in thru the Main Entrance and check in at the main information desk.  Wear comfortable clothing and clothing appropriate for easy access to any Portacath or PICC line.   We strive to give you quality time with your provider. You may need to reschedule your appointment if you arrive late (15 or more minutes).  Arriving late affects you and other patients whose appointments are after yours.  Also, if you miss three or more appointments without notifying the office, you may be dismissed from the clinic at the provider's discretion.      For prescription refill requests, have your pharmacy contact our office and allow 72 hours for refills to be completed.    Today you received Feraheme IV per provider's order.    BELOW ARE SYMPTOMS THAT SHOULD BE REPORTED IMMEDIATELY: *FEVER GREATER THAN 100.4 F (38 C) OR HIGHER *CHILLS OR SWEATING *NAUSEA AND VOMITING THAT IS NOT CONTROLLED WITH YOUR NAUSEA MEDICATION *UNUSUAL SHORTNESS OF BREATH *UNUSUAL BRUISING OR BLEEDING *URINARY PROBLEMS (pain or burning when urinating, or frequent urination) *BOWEL PROBLEMS (unusual diarrhea, constipation, pain near the anus) TENDERNESS IN MOUTH AND THROAT WITH OR WITHOUT PRESENCE OF ULCERS (sore throat, sores in mouth, or a toothache) UNUSUAL RASH, SWELLING OR PAIN  UNUSUAL VAGINAL DISCHARGE OR ITCHING   Items with * indicate a potential emergency and should be followed up as soon as possible or go to the Emergency Department if any problems should occur.  Please show the CHEMOTHERAPY ALERT CARD or IMMUNOTHERAPY ALERT CARD at check-in to the Emergency Department and triage nurse.  Should you have questions after your visit or need to cancel or reschedule your appointment, please contact [facility] [phone]  and follow the prompts.  Office hours are 8:00 a.m. to 4:30 p.m. Monday - Friday. Please note that voicemails left after 4:00 p.m. may not be returned until the following business day.  We are closed weekends and major holidays. You have access to a nurse at all times for urgent questions. Please call the main number to the clinic [phone] and follow the prompts.  For any non-urgent questions, you may also contact your provider using MyChart. We now offer e-Visits for anyone 50 and older to request care online for non-urgent symptoms. For details visit [email or web address].   Also download the MyChart app! Go to the app store, search "MyChart", open the app, select [facility], and log in with your MyChart username and password.  Due to Covid, a mask is required upon entering the hospital/clinic. If you do not have a mask, one will be given to you upon arrival. For doctor visits, patients may have 1 support person aged 36 or older with them. For treatment visits, patients cannot have anyone with them due to current Covid guidelines and our immunocompromised population.

## 2021-07-27 NOTE — Progress Notes (Signed)
Pt presents today for Feraheme IV iron infusion. Vital signs stable and pt voiced no new complaints at this time.   Peripheral IV started with good blood return pre and post infusion.  Feraheme IV iron infusion given today per MD orders. Tolerated infusion without adverse affects. Vital signs stable. No complaints at this time. Discharged from clinic ambulatory in stable condition. Alert and oriented x 3. F/U with [facility] as scheduled.

## 2021-08-02 NOTE — Progress Notes (Signed)
Carelink Summary Report / Loop Recorder 

## 2021-08-03 ENCOUNTER — Ambulatory Visit (HOSPITAL_COMMUNITY): Payer: Medicare Other

## 2021-08-03 ENCOUNTER — Encounter (HOSPITAL_COMMUNITY): Payer: Self-pay | Admitting: Hematology

## 2021-08-04 ENCOUNTER — Ambulatory Visit (HOSPITAL_COMMUNITY)
Admission: RE | Admit: 2021-08-04 | Discharge: 2021-08-04 | Disposition: A | Discharge status: Home / Self Care | Payer: Medicare Other | Source: Ambulatory Visit | Attending: Physician Assistant | Admitting: Physician Assistant

## 2021-08-04 ENCOUNTER — Other Ambulatory Visit: Payer: Self-pay

## 2021-08-04 DIAGNOSIS — R161 Splenomegaly, not elsewhere classified: Secondary | ICD-10-CM | POA: Diagnosis not present

## 2021-08-04 DIAGNOSIS — D696 Thrombocytopenia, unspecified: Secondary | ICD-10-CM | POA: Insufficient documentation

## 2021-08-04 DIAGNOSIS — Z9049 Acquired absence of other specified parts of digestive tract: Secondary | ICD-10-CM | POA: Diagnosis not present

## 2021-08-05 ENCOUNTER — Inpatient Hospital Stay (HOSPITAL_COMMUNITY): Payer: Medicare Other

## 2021-08-05 VITALS — BP 141/68 | HR 67 | Temp 96.6°F | Resp 20

## 2021-08-05 DIAGNOSIS — D500 Iron deficiency anemia secondary to blood loss (chronic): Secondary | ICD-10-CM

## 2021-08-05 DIAGNOSIS — D696 Thrombocytopenia, unspecified: Secondary | ICD-10-CM | POA: Diagnosis not present

## 2021-08-05 DIAGNOSIS — D509 Iron deficiency anemia, unspecified: Secondary | ICD-10-CM | POA: Diagnosis not present

## 2021-08-05 MED ORDER — ACETAMINOPHEN 325 MG PO TABS
650.0000 mg | ORAL_TABLET | Freq: Once | ORAL | Status: AC
  Administered 2021-08-05: 650 mg via ORAL
  Filled 2021-08-05: qty 2

## 2021-08-05 MED ORDER — SODIUM CHLORIDE 0.9 % IV SOLN
510.0000 mg | Freq: Once | INTRAVENOUS | Status: AC
  Administered 2021-08-05: 510 mg via INTRAVENOUS
  Filled 2021-08-05: qty 510

## 2021-08-05 MED ORDER — SODIUM CHLORIDE 0.9 % IV SOLN: Freq: Once | INTRAVENOUS | Status: AC

## 2021-08-05 MED ORDER — LORATADINE 10 MG PO TABS
10.0000 mg | ORAL_TABLET | Freq: Once | ORAL | Status: AC
  Administered 2021-08-05: 10 mg via ORAL
  Filled 2021-08-05: qty 1

## 2021-08-05 NOTE — Progress Notes (Signed)
Patient presents today for iron infusion.  Patient is in satisfactory condition with no complaints voiced.  Vital signs are stable.  We will proceed with infusion per MD orders.   Patient tolerated treatment well with no complaints voiced.  Patient left ambulatory in stable condition.  Vital signs stable at discharge.  Follow up as scheduled.    

## 2021-08-05 NOTE — Patient Instructions (Signed)
[facility]  Discharge Instructions: Thank you for choosing [facility] to provide your oncology and hematology care.  If you have a lab appointment with the Cancer Center, please come in thru the Main Entrance and check in at the main information desk.  Wear comfortable clothing and clothing appropriate for easy access to any Portacath or PICC line.   We strive to give you quality time with your provider. You may need to reschedule your appointment if you arrive late (15 or more minutes).  Arriving late affects you and other patients whose appointments are after yours.  Also, if you miss three or more appointments without notifying the office, you may be dismissed from the clinic at the provider's discretion.      For prescription refill requests, have your pharmacy contact our office and allow 72 hours for refills to be completed.        To help prevent nausea and vomiting after your treatment, we encourage you to take your nausea medication as directed.  BELOW ARE SYMPTOMS THAT SHOULD BE REPORTED IMMEDIATELY: *FEVER GREATER THAN 100.4 F (38 C) OR HIGHER *CHILLS OR SWEATING *NAUSEA AND VOMITING THAT IS NOT CONTROLLED WITH YOUR NAUSEA MEDICATION *UNUSUAL SHORTNESS OF BREATH *UNUSUAL BRUISING OR BLEEDING *URINARY PROBLEMS (pain or burning when urinating, or frequent urination) *BOWEL PROBLEMS (unusual diarrhea, constipation, pain near the anus) TENDERNESS IN MOUTH AND THROAT WITH OR WITHOUT PRESENCE OF ULCERS (sore throat, sores in mouth, or a toothache) UNUSUAL RASH, SWELLING OR PAIN  UNUSUAL VAGINAL DISCHARGE OR ITCHING   Items with * indicate a potential emergency and should be followed up as soon as possible or go to the Emergency Department if any problems should occur.  Please show the CHEMOTHERAPY ALERT CARD or IMMUNOTHERAPY ALERT CARD at check-in to the Emergency Department and triage nurse.  Should you have questions after your visit or need to cancel  or reschedule your appointment, please contact [facility] [phone]  and follow the prompts.  Office hours are 8:00 a.m. to 4:30 p.m. Monday - Friday. Please note that voicemails left after 4:00 p.m. may not be returned until the following business day.  We are closed weekends and major holidays. You have access to a nurse at all times for urgent questions. Please call the main number to the clinic [phone] and follow the prompts.  For any non-urgent questions, you may also contact your provider using MyChart. We now offer e-Visits for anyone 18 and older to request care online for non-urgent symptoms. For details visit [email or web address].   Also download the MyChart app! Go to the app store, search "MyChart", open the app, select , and log in with your MyChart username and password.  Due to Covid, a mask is required upon entering the hospital/clinic. If you do not have a mask, one will be given to you upon arrival. For doctor visits, patients may have 1 support person aged 18 or older with them. For treatment visits, patients cannot have anyone with them due to current Covid guidelines and our immunocompromised population.  

## 2021-08-07 NOTE — Progress Notes (Signed)
Virtual Visit via Telephone Note [facility]  I connected with Linda [name]  on 08/09/21 at  1:59 PM  by telephone and verified that I am speaking with the correct person using two identifiers.  Location: Patient: Home Provider: [facility]   I discussed the limitations, risks, security and privacy concerns of performing an evaluation and management service by telephone and the availability of in person appointments. I also discussed with the patient that there may be a patient responsible charge related to this service. The patient expressed understanding and agreed to proceed.   HISTORY OF PRESENT ILLNESS: Linda [name] follows at our clinic for iron deficiency anemia and thrombocytopenia.  She is contacted today to discuss results of abdominal ultrasound.  She was last seen in clinic by Tarri Abernethy PA-C on 07/22/2021.  Since her last visit, she received IV iron with Feraheme x2 on 07/27/2021 and 08/05/2021.  She reports that she feels slightly better after IV iron.  She reports improved energy, decreased dyspnea on exertion, and resolution of her pica cravings for ice .  She remains on Eliquis for atrial fibrillation but continues to deny any abnormal bleeding events such as epistaxis, hematemesis, hematochezia, or melena.    Regarding her thrombocytopenia, she denies any bleeding, bruising, or petechial rash.   No B symptoms such as fever, chills, night sweats, unintentional weight loss.  She reports 50% energy and 75% appetite.  She endorses that she is maintaining a stable weight at this time.    OBSERVATIONS/OBJECTIVE: Review of Systems  Constitutional:  Positive for malaise/fatigue (Improved but persistent fatigue). Negative for chills, diaphoresis, fever and weight loss.  HENT:         Difficulty chewing due to dental issues  Respiratory:  Positive for cough and shortness of breath (Improved but persistent dyspnea on exertion).    Cardiovascular:  Negative for chest pain and palpitations.  Gastrointestinal:  Negative for abdominal pain, blood in stool, melena, nausea and vomiting.  Neurological:  Positive for dizziness, tingling (Neuropathy of bilateral feet) and headaches.   PHYSICAL EXAM (per limitations of virtual telephone visit): The patient is alert and oriented x 3, exhibiting adequate mentation, good mood, and ability to speak in full sentences and execute sound judgement.   ASSESSMENT & PLAN: 1.  Iron deficiency anemia: - Colonoscopy on 04/08/2019 with normal ileum, moderate diverticulosis in the rectosigmoid colon, external and internal hemorrhoids.  Pathology consistent with tubular adenomas. - Likely secondary to malabsorption, patient is on oral iron without improvement - Patient denies bright red blood per rectum or melena  - Symptomatic with fatigue, lightheadedness, and dyspnea on exertion - Most recent labs (07/15/2021): Normal Hgb 12.4 with MCV 104.5, ferritin 28, TIBC 415 with iron saturation 37% - She received IV Feraheme on 07/27/2021 and 08/05/2021, and reports feeling somewhat improved - PLAN: Continue iron tablet every other day. - CBC and iron panel with RTC in 6 months.  2.  Thrombocytopenia & macrocytosis - Mild thrombocytopenia since 2018 - CBC on 09/23/2020 did note some platelet clumping could be skewing the counts - CT scan of the chest on 01/05/2017 at [facility] showed borderline splenomegaly.  No comment on the liver. - Abdominal ultrasound (08/04/2021): Heterogeneous liver with slightly nodular contour suspicious for either diffuse hepatocellular disease or cirrhosis; mild splenomegaly 575 cc. - Most recent labs (07/15/2021) show platelets relatively stable at 117; MCV 104.5.  Vitamin B12 elevated at 1604, normal LDH.  Folate normal when  checked in April 2022. - She admits to easy bruising but denies any petechial rash or abnormal bleeding events. - Differential diagnosis  favors borderline splenomegaly due to liver disease versus early myelodysplasia - PLAN: Referral to gastroenterology due to findings suspicious for possible cirrhosis (previously seen by Dr. [name]) - Continue to monitor with repeat CBC, B12, folate at follow-up visit in 6 months. - If any significant deviation from baseline, would consider bone marrow biopsy. - Patient can continue taking vitamin B12 every other day, elevated B12 may be false elevation as acute phase reactant.  3.  Atrial fibrillation: -Continue Eliquis twice daily.    4.  Pulmonary nodule -Patient is following with PCP (Dr. Woody Seller)   FOLLOW UP INSTRUCTIONS: - Repeat labs (CBC, iron/TIBC, ferritin, B12, methylmalonic acid, folate) and RTC in 6 months -Referral to GI due to fatty liver disease with possible early cirrhosis    I discussed the assessment and treatment plan with the patient. The patient was provided an opportunity to ask questions and all were answered. The patient agreed with the plan and demonstrated an understanding of the instructions.   The patient was advised to call back or seek an in-person evaluation if the symptoms worsen or if the condition fails to improve as anticipated.  I provided 13 minutes of non-face-to-face time during this encounter.   Harriett Rush, PA-C 08/09/2021 2:11 PM

## 2021-08-09 ENCOUNTER — Other Ambulatory Visit: Payer: Self-pay

## 2021-08-09 ENCOUNTER — Inpatient Hospital Stay (HOSPITAL_BASED_OUTPATIENT_CLINIC_OR_DEPARTMENT_OTHER): Payer: Medicare Other | Admitting: Physician Assistant

## 2021-08-09 DIAGNOSIS — D500 Iron deficiency anemia secondary to blood loss (chronic): Secondary | ICD-10-CM

## 2021-08-09 DIAGNOSIS — R911 Solitary pulmonary nodule: Secondary | ICD-10-CM

## 2021-08-09 DIAGNOSIS — K760 Fatty (change of) liver, not elsewhere classified: Secondary | ICD-10-CM

## 2021-08-09 DIAGNOSIS — I4891 Unspecified atrial fibrillation: Secondary | ICD-10-CM

## 2021-08-09 DIAGNOSIS — Z7901 Long term (current) use of anticoagulants: Secondary | ICD-10-CM

## 2021-08-09 DIAGNOSIS — D696 Thrombocytopenia, unspecified: Secondary | ICD-10-CM | POA: Diagnosis not present

## 2021-08-22 LAB — CUP PACEART REMOTE DEVICE CHECK
Date Time Interrogation Session: 20221127034524
Implantable Pulse Generator Implant Date: 20190212

## 2021-08-23 ENCOUNTER — Telehealth: Payer: Self-pay

## 2021-08-23 NOTE — Telephone Encounter (Signed)
LINQ alert received. Device has reached RRT 11/28 Route to triage LR  Unsuccessful telephone encounter to patient to discuss RRT status of linq. Hipaa compliant VM message left requesting call back to [phone]. Future remote monitoring appointments cancelled. Patient marked inactive in paceart. Discontinued in carelink. Will confirm address upon call back and request return kit.

## 2021-08-24 DIAGNOSIS — E1165 Type 2 diabetes mellitus with hyperglycemia: Secondary | ICD-10-CM | POA: Diagnosis not present

## 2021-08-24 DIAGNOSIS — I10 Essential (primary) hypertension: Secondary | ICD-10-CM | POA: Diagnosis not present

## 2021-08-24 DIAGNOSIS — Z299 Encounter for prophylactic measures, unspecified: Secondary | ICD-10-CM | POA: Diagnosis not present

## 2021-08-24 DIAGNOSIS — Z7189 Other specified counseling: Secondary | ICD-10-CM | POA: Diagnosis not present

## 2021-08-24 DIAGNOSIS — R5383 Other fatigue: Secondary | ICD-10-CM | POA: Diagnosis not present

## 2021-08-24 DIAGNOSIS — Z6841 Body Mass Index (BMI) 40.0 and over, adult: Secondary | ICD-10-CM | POA: Diagnosis not present

## 2021-08-24 DIAGNOSIS — E7800 Pure hypercholesterolemia, unspecified: Secondary | ICD-10-CM | POA: Diagnosis not present

## 2021-08-24 DIAGNOSIS — Z1331 Encounter for screening for depression: Secondary | ICD-10-CM | POA: Diagnosis not present

## 2021-08-24 DIAGNOSIS — D6869 Other thrombophilia: Secondary | ICD-10-CM | POA: Diagnosis not present

## 2021-08-24 DIAGNOSIS — Z0000 Encounter for general adult medical examination without abnormal findings: Secondary | ICD-10-CM | POA: Diagnosis not present

## 2021-08-24 DIAGNOSIS — E1140 Type 2 diabetes mellitus with diabetic neuropathy, unspecified: Secondary | ICD-10-CM | POA: Diagnosis not present

## 2021-08-24 DIAGNOSIS — Z87891 Personal history of nicotine dependence: Secondary | ICD-10-CM | POA: Diagnosis not present

## 2021-08-24 DIAGNOSIS — Z79899 Other long term (current) drug therapy: Secondary | ICD-10-CM | POA: Diagnosis not present

## 2021-08-24 DIAGNOSIS — Z1339 Encounter for screening examination for other mental health and behavioral disorders: Secondary | ICD-10-CM | POA: Diagnosis not present

## 2021-08-25 NOTE — Telephone Encounter (Signed)
Patient called advised ILR @ RRT. Patient is unsure about explant or leave in. Has an apt in [place] with Dr. Rayann Heman 09/30/20. Return kit sent address.

## 2021-09-05 ENCOUNTER — Encounter: Payer: Self-pay | Admitting: Internal Medicine

## 2021-09-23 DIAGNOSIS — I10 Essential (primary) hypertension: Secondary | ICD-10-CM | POA: Diagnosis not present

## 2021-09-23 DIAGNOSIS — F32A Depression, unspecified: Secondary | ICD-10-CM | POA: Diagnosis not present

## 2021-09-28 ENCOUNTER — Ambulatory Visit: Payer: Medicare Other | Admitting: Internal Medicine

## 2021-09-30 ENCOUNTER — Other Ambulatory Visit: Payer: Self-pay

## 2021-09-30 ENCOUNTER — Ambulatory Visit (INDEPENDENT_AMBULATORY_CARE_PROVIDER_SITE_OTHER): Payer: Medicare Other | Admitting: Internal Medicine

## 2021-09-30 ENCOUNTER — Encounter (HOSPITAL_COMMUNITY): Payer: Self-pay | Admitting: Hematology

## 2021-09-30 ENCOUNTER — Encounter: Payer: Self-pay | Admitting: Internal Medicine

## 2021-09-30 VITALS — BP 163/80 | HR 80 | Ht 62.5 in | Wt 210.0 lb

## 2021-09-30 DIAGNOSIS — I4819 Other persistent atrial fibrillation: Secondary | ICD-10-CM | POA: Diagnosis not present

## 2021-09-30 DIAGNOSIS — D6869 Other thrombophilia: Secondary | ICD-10-CM

## 2021-09-30 DIAGNOSIS — G4733 Obstructive sleep apnea (adult) (pediatric): Secondary | ICD-10-CM | POA: Diagnosis not present

## 2021-09-30 NOTE — Progress Notes (Signed)
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 female who presents today for routine electrophysiology followup.  Since last being seen in our clinic, the patient reports doing very well.  Today, she denies symptoms of palpitations, chest pain, shortness of breath,  lower extremity edema, dizziness, presyncope, or syncope.  The patient is otherwise without complaint today.   Past Medical History:  Diagnosis Date   Allergy    Anemia 07/25/2017   Anxiety    Arthritis    Atrial fibrillation (HCC)    Cataract    COPD (chronic obstructive pulmonary disease) (HCC)    Depression    Diabetes mellitus without complication (HCC)    Emphysema of lung (HCC)    GERD (gastroesophageal reflux disease) 10/28/2020   Hyperlipidemia    Hypertension    Oxygen deficiency    Sleep apnea    Typical atrial flutter ([place])    Past Surgical History:  Procedure Laterality Date   A-FLUTTER ABLATION N/A 10/04/2017   Procedure: A-FLUTTER ABLATION;  Surgeon: Thompson Grayer, MD;  Location: [facility];  Service: Cardiovascular;  Laterality: N/A;   ABDOMINAL HYSTERECTOMY     fibroids   ATRIAL FIBRILLATION ABLATION N/A 12/09/2020   Procedure: ATRIAL FIBRILLATION ABLATION;  Surgeon: Thompson Grayer, MD;  Location: [place] [facility];  Service: Cardiovascular;  Laterality: N/A;   blood clot removal from base of brain  1990   CARDIOVERSION N/A 10/04/2020   Procedure: CARDIOVERSION;  Surgeon: Freada Bergeron, MD;  Location: [facility];  Service: Cardiovascular;  Laterality: N/A;   CARPAL TUNNEL RELEASE     CATARACT EXTRACTION W/PHACO Left 06/16/2013   Procedure: CATARACT EXTRACTION PHACO AND INTRAOCULAR LENS PLACEMENT ([place]);  Surgeon: Tonny Branch, MD;  Location: [facility];  Service: Ophthalmology;  Laterality: Left;  CDE:  12.18   CATARACT EXTRACTION W/PHACO Right 06/26/2013   Procedure: CATARACT EXTRACTION PHACO AND INTRAOCULAR LENS PLACEMENT ([facility]);  Surgeon: Tonny Branch, MD;   Location: [facility];  Service: Ophthalmology;  Laterality: Right;  CDE:14.71   CESAREAN SECTION     CHOLECYSTECTOMY     COLONOSCOPY WITH PROPOFOL N/A 04/08/2019   Dr.Fields: diverticulosis, 11 simple adenomas removed, hemorrhoids. next colonoscopy in 3 years.    DILATION AND CURETTAGE OF UTERUS     ESOPHAGOGASTRODUODENOSCOPY (EGD) WITH PROPOFOL N/A 04/08/2019   Dr. Oneida Alar: H.pylori gastritis   EYE SURGERY  12/2016   FOOT NEUROMA SURGERY Right    LOOP RECORDER INSERTION N/A 11/06/2017   Procedure: LOOP RECORDER INSERTION;  Surgeon: Thompson Grayer, MD;  Location: [place] [facility];  Service: Cardiovascular;  Laterality: N/A;   MOUTH SURGERY     POLYPECTOMY  04/08/2019   Procedure: POLYPECTOMY;  Surgeon: Danie Binder, MD;  Location: [facility];  Service: Endoscopy;;   TEE WITHOUT CARDIOVERSION N/A 12/09/2020   Procedure: TRANSESOPHAGEAL ECHOCARDIOGRAM (TEE);  Surgeon: Lelon Perla, MD;  Location[facility];  Service: Cardiovascular;  Laterality: N/A;   TONSILLECTOMY      ROS- all systems are reviewed and negatives except as per HPI above  Current Outpatient Medications  Medication Sig Dispense Refill   acetaminophen (TYLENOL) 500 MG tablet Take 1,000 mg by mouth every 6 (six) hours as needed for moderate pain or headache.     ADVAIR DISKUS 250-50 MCG/DOSE AEPB Inhale 1 puff into the lungs 2 (two) times daily.     apixaban (ELIQUIS) 5 MG TABS tablet Take 1 tablet (5 mg total)  by mouth 2 (two) times daily. 180 tablet 3   Ascorbic Acid (VITAMIN C) 1000 MG tablet Take 1,000 mg by mouth daily.     calcium carbonate (TUMS - DOSED IN MG ELEMENTAL CALCIUM) 500 MG chewable tablet Chew 1 tablet by mouth daily as needed.     calcium-vitamin D (OSCAL WITH D) 500-200 MG-UNIT tablet Take 1 tablet by mouth daily.     cetirizine (ZYRTEC) 10 MG tablet Take 10 mg by mouth daily.     Cholecalciferol (VITAMIN D3 SUPER STRENGTH) 50 MCG (2000 UT) TABS Take 2,000 Units by mouth daily at 12 noon.      DULoxetine (CYMBALTA) 30 MG capsule Take 30 mg by mouth daily.     ferrous sulfate 325 (65 FE) MG EC tablet Take 325 mg by mouth every other day.     Melatonin 10 MG TABS Take 10 mg by mouth at bedtime.     metFORMIN (GLUCOPHAGE) 500 MG tablet Take 500 mg by mouth daily with breakfast.     metoprolol succinate (TOPROL-XL) 25 MG 24 hr tablet Take 1 tablet (25 mg total) by mouth daily. Dose decreased 03/25/2021 - PLACE ON FILE - PATIENT WILL CALL WHEN SHE NEEDS 90 tablet 3   Multiple Vitamins-Minerals (ONE DAILY CALCIUM/IRON) TABS Take 1 tablet by mouth daily.     OXYGEN Inhale 2 L into the lungs at bedtime. With BPAP     pravastatin (PRAVACHOL) 20 MG tablet Take 1 tablet (20 mg total) daily by mouth. 90 tablet 3   PROAIR HFA 108 (90 Base) MCG/ACT inhaler Inhale 2 puffs into the lungs every 4 (four) hours as needed for wheezing or shortness of breath. Every 4-6 hours as needed for wheezing or sob     Probiotic Product (PROBIOTIC DAILY PO) Take 1 capsule by mouth daily.      vitamin B-12 (CYANOCOBALAMIN) 1000 MCG tablet Take 1,000 mcg by mouth daily.     No current facility-administered medications for this visit.    Physical Exam: Vitals:   09/30/21 1022  BP: (!) 163/80  Pulse: 80  Weight: 210 lb (95.3 kg)  Height: 5' 2.5" (1.588 m)    GEN- The patient is well appearing, alert and oriented x 3 today.   Head- normocephalic, atraumatic Eyes-  Sclera clear, conjunctiva pink Ears- hearing intact Oropharynx- clear Lungs- Clear to ausculation bilaterally, normal work of breathing Heart- Regular rate and rhythm, no murmurs, rubs or gallops, PMI not laterally displaced GI- soft, NT, ND, + BS Extremities- no clubbing, cyanosis, or edema  Wt Readings from Last 3 Encounters:  09/30/21 210 lb (95.3 kg)  07/22/21 220 lb 9.6 oz (100.1 kg)  03/25/21 217 lb (98.4 kg)     Assessment and Plan:  Persistent afib Well controlled post ablation  AF burden 0% by ILR Continue eliquis ILR is at RRT    We discussed options of removal at length today.  She does not wish to have her ILR removed at this time.  She is aware to contact my office to arrange removal of her ILR with me in [facility] street office should she change her mind.  2. OSA Uses BiPAP  3. Obesity Body mass index is 37.8 kg/m. Lifestyle modification advised  4. HL Stable No change required today   Risks, benefits and potential toxicities for medications prescribed and/or refilled reviewed with patient today.   Return in a year  Thompson Grayer MD, [facility] 09/30/2021 11:17 AM

## 2021-09-30 NOTE — Patient Instructions (Signed)
Medication Instructions:  Continue all current medications.  Labwork: none  Testing/Procedures: none  Follow-Up: 1 year - Dr.  Allred   Any Other Special Instructions Will Be Listed Below (If Applicable).   If you need a refill on your cardiac medications before your next appointment, please call your pharmacy.  

## 2021-10-03 DIAGNOSIS — J449 Chronic obstructive pulmonary disease, unspecified: Secondary | ICD-10-CM | POA: Diagnosis not present

## 2021-10-03 DIAGNOSIS — Z299 Encounter for prophylactic measures, unspecified: Secondary | ICD-10-CM | POA: Diagnosis not present

## 2021-10-03 DIAGNOSIS — Z87891 Personal history of nicotine dependence: Secondary | ICD-10-CM | POA: Diagnosis not present

## 2021-10-03 DIAGNOSIS — I10 Essential (primary) hypertension: Secondary | ICD-10-CM | POA: Diagnosis not present

## 2021-10-03 DIAGNOSIS — R748 Abnormal levels of other serum enzymes: Secondary | ICD-10-CM | POA: Diagnosis not present

## 2021-10-03 DIAGNOSIS — Z6838 Body mass index (BMI) 38.0-38.9, adult: Secondary | ICD-10-CM | POA: Diagnosis not present

## 2021-10-03 DIAGNOSIS — E1165 Type 2 diabetes mellitus with hyperglycemia: Secondary | ICD-10-CM | POA: Diagnosis not present

## 2021-10-11 ENCOUNTER — Encounter: Payer: Self-pay | Admitting: Gastroenterology

## 2021-10-11 NOTE — Progress Notes (Signed)
Referring Provider: Harriett Rush, * Primary Care Physician:  [name], MD Primary GI Physician: Dr. [name]  Chief Complaint  Patient presents with   fatty liver    F/u   Anemia    F/u.    HPI:   Linda [name] is a 76 y.o. female presenting today at the request of Pennington, Rebekah M, PA-C for fatty liver with possible early cirrhosis on [place].   She also has history of multiple tubular adenomas, H. pylori gastritis s/p treatment with Pylera and confirmed eradication via stool antigen test, IDA with EGD and colonoscopy on file from July 2020, due for surveillance colonoscopy in July 2023, and maintaining on oral iron with most recent hemoglobin 12.4 in October 2022 without IDA.  She follows with hematology receiving IV iron periodically, last in November 2022.  Reviewed abdominal ultrasound dated 08/04/2021 revealing surgically absent gallbladder, CBD 9 mm, heterogenous liver echotexture with slightly nodular liver contour, spleen mildly enlarged.  CT angio chest with contrast at [facility] in December 2021 also noting nodular contour of the liver consistent with cirrhosis.  I do also note chronic thrombocytopenia.  Slight elevation of AST periodically, but most recent LFTs in October 2022 within normal limits.  Also with chronic mild elevation of total bilirubin, most recently 1.7 in October 2022.   Today:  No significant alcohol use previously. None currently. No history of illicit drug use. Used to smoke tobacco, but quit >20 years ago. No known exposures to hepatitis.  No blood transfusions previously. No Fhx of liver disease, or autoimmune conditions.   Had blood work last week with Dr. Woody Seller and would like for [place] to review this prior to ordering additional blood work.  Admits to occasional, mild swelling in her lower extremities that resolves overnight.  Notices this mostly if she does a lot of sitting.  No abdominal distention, yellowing of the eyes or skin,  changes in mental status/confusion.  She is forgetful at times, but this is chronic.  Admits to bruising easily, but denies bright red blood per rectum or black stools.  She is chronically on Eliquis.  Bowels are moving well. Taking a probiotic daily. No N/V, dysphagia. Admits to occasional heartburn- about twice a week. Always at night. Occurs if she takes her night time medications without a snack. Usually eats a cup of applesauce or Jell-O and this prevents her heartburn. Good response to tums PRN.  Denies abdominal pain.   Uses 2 L at night with BPAP. No CP or palpitations. Doesn't want to have loop recorder removed.  States that his battery life has ran out, but she did not see the need to have it removed.  Its not bothering her.  No NSAIDs. Rare Tylenol.   Past Medical History:  Diagnosis Date   Allergy    Anemia 07/25/2017   Anxiety    Arthritis    Atrial fibrillation [facility])    s/p ablation in March 2022, ILP remains in place   Cataract    COPD (chronic obstructive pulmonary disease) ([place])    Depression    Diabetes mellitus without complication ([place])    Emphysema of lung ([place])    GERD (gastroesophageal reflux disease) 10/28/2020   Hyperlipidemia    Hypertension    Oxygen deficiency    Sleep apnea    Typical atrial flutter [facility])     Past Surgical History:  Procedure Laterality Date   A-FLUTTER ABLATION N/A 10/04/2017   Procedure: A-FLUTTER ABLATION;  Surgeon: Thompson Grayer, MD;  Location: [place] CV LAB;  Service: Cardiovascular;  Laterality: N/A;   ABDOMINAL HYSTERECTOMY     fibroids   ATRIAL FIBRILLATION ABLATION N/A 12/09/2020   Procedure: ATRIAL FIBRILLATION ABLATION;  Surgeon: Thompson Grayer, MD;  Location: [place] CV LAB;  Service: Cardiovascular;  Laterality: N/A;   blood clot removal from base of brain  1990   CARDIOVERSION N/A 10/04/2020   Procedure: CARDIOVERSION;  Surgeon: Freada Bergeron, MD;  Location: [place];  Service: Cardiovascular;   Laterality: N/A;   CARPAL TUNNEL RELEASE     CATARACT EXTRACTION W/PHACO Left 06/16/2013   Procedure: CATARACT EXTRACTION PHACO AND INTRAOCULAR LENS PLACEMENT ([place]);  Surgeon: Tonny Branch, MD;  Location: [facility];  Service: Ophthalmology;  Laterality: Left;  CDE:  12.18   CATARACT EXTRACTION W/PHACO Right 06/26/2013   Procedure: CATARACT EXTRACTION PHACO AND INTRAOCULAR LENS PLACEMENT ([facility]);  Surgeon: Tonny Branch, MD;  Location: [facility];  Service: Ophthalmology;  Laterality: Right;  CDE:14.71   CESAREAN SECTION     CHOLECYSTECTOMY     COLONOSCOPY WITH PROPOFOL N/A 04/08/2019   Dr.Fields: diverticulosis, 11 simple adenomas removed, hemorrhoids. next colonoscopy in 3 years.    DILATION AND CURETTAGE OF UTERUS     ESOPHAGOGASTRODUODENOSCOPY (EGD) WITH PROPOFOL N/A 04/08/2019   Dr. Oneida Alar: H.pylori gastritis   EYE SURGERY  12/2016   FOOT NEUROMA SURGERY Right    LOOP RECORDER INSERTION N/A 11/06/2017   Procedure: LOOP RECORDER INSERTION;  Surgeon: Thompson Grayer, MD;  Location: [place] CV LAB;  Service: Cardiovascular;  Laterality: N/A;   MOUTH SURGERY     POLYPECTOMY  04/08/2019   Procedure: POLYPECTOMY;  Surgeon: Danie Binder, MD;  Location: [facility];  Service: Endoscopy;;   TEE WITHOUT CARDIOVERSION N/A 12/09/2020   Procedure: TRANSESOPHAGEAL ECHOCARDIOGRAM (TEE);  Surgeon: Lelon Perla, MD;  Location: [facility] ENDOSCOPY;  Service: Cardiovascular;  Laterality: N/A;   TONSILLECTOMY      Current Outpatient Medications  Medication Sig Dispense Refill   acetaminophen (TYLENOL) 500 MG tablet Take 1,000 mg by mouth every 6 (six) hours as needed for moderate pain or headache.     ADVAIR DISKUS 250-50 MCG/DOSE AEPB Inhale 1 puff into the lungs 2 (two) times daily.     apixaban (ELIQUIS) 5 MG TABS tablet Take 1 tablet (5 mg total) by mouth 2 (two) times daily. 180 tablet 3   Ascorbic Acid (VITAMIN C) 1000 MG tablet Take 1,000 mg by mouth daily.     calcium carbonate (TUMS - DOSED IN MG ELEMENTAL  CALCIUM) 500 MG chewable tablet Chew 1 tablet by mouth daily as needed.     calcium-vitamin D (OSCAL WITH D) 500-200 MG-UNIT tablet Take 1 tablet by mouth daily.     cetirizine (ZYRTEC) 10 MG tablet Take 10 mg by mouth daily.     Cholecalciferol (VITAMIN D3 SUPER STRENGTH) 50 MCG (2000 UT) TABS Take 2,000 Units by mouth daily at 12 noon.     Cinnamon Oil OIL by Does not apply route. 2 caps daily     DULoxetine (CYMBALTA) 30 MG capsule Take 30 mg by mouth daily.     ferrous sulfate 325 (65 FE) MG EC tablet Take 325 mg by mouth every other day.     Melatonin 10 MG TABS Take 10 mg by mouth at bedtime.     metFORMIN (GLUCOPHAGE) 500 MG tablet Take 500 mg by mouth daily with breakfast. 2 in the morning, 1 at night.  metoprolol succinate (TOPROL-XL) 25 MG 24 hr tablet Take 1 tablet (25 mg total) by mouth daily. Dose decreased 03/25/2021 - PLACE ON FILE - PATIENT WILL CALL WHEN SHE NEEDS 90 tablet 3   Multiple Vitamins-Minerals (ONE DAILY CALCIUM/IRON) TABS Take 1 tablet by mouth daily.     OXYGEN Inhale 2 L into the lungs at bedtime. With BPAP     pravastatin (PRAVACHOL) 20 MG tablet Take 1 tablet (20 mg total) daily by mouth. 90 tablet 3   PROAIR HFA 108 (90 Base) MCG/ACT inhaler Inhale 2 puffs into the lungs every 4 (four) hours as needed for wheezing or shortness of breath. Every 4-6 hours as needed for wheezing or sob     Probiotic Product (PROBIOTIC DAILY PO) Take 1 capsule by mouth daily.      Red Yeast Rice Extract (RED YEAST RICE PO) Take by mouth. 1 at night     vitamin B-12 (CYANOCOBALAMIN) 1000 MCG tablet Take 1,000 mcg by mouth daily.     No current facility-administered medications for this visit.    Allergies as of 10/12/2021 - Review Complete 10/12/2021  Allergen Reaction Noted   Trazodone and nefazodone Hives and Other (See Comments) 01/10/2017   Benicar [olmesartan]  11/30/2020   Nickel Other (See Comments) 10/04/2017   Tetanus toxoids Other (See Comments) 06/28/2018   Adhesive  [tape] Itching 06/09/2013   Bactrim [sulfamethoxazole-trimethoprim] Itching and Rash 06/09/2013   Penicillins Itching, Rash, and Other (See Comments) 06/09/2013   Ppd [tuberculin purified protein derivative] Swelling and Other (See Comments) 06/09/2013   Sulfamethoxazole-trimethoprim Itching and Rash 06/09/2013    Family History  Problem Relation Age of Onset   Emphysema Mother    Alcohol abuse Mother    Arthritis Mother    COPD Mother    Depression Mother    Hyperlipidemia Mother    Hypertension Mother    Heart attack Father    Cancer Father        lung   Heart disease Father    Diabetes Brother    COPD Brother    Other Maternal Grandmother        swine flu 1919   Colon cancer Neg Hx    Colon polyps Neg Hx    Liver disease Neg Hx     Social History   Socioeconomic History   Marital status: Widowed    Spouse name: Not on file   Number of children: 1   Years of education: 32   Highest education level: Not on file  Occupational History   Occupation: retired    Comment: catering, Engineer, maintenance  Tobacco Use   Smoking status: Former    Packs/day: 1.50    Types: Cigarettes    Start date: 09/25/1958    Quit date: 06/06/2001    Years since quitting: 20.3   Smokeless tobacco: Never  Vaping Use   Vaping Use: Never used  Substance and Sexual Activity   Alcohol use: Not Currently    Comment: used to drink an occasional alcoholic beverage. No hyistory of heavy alcohol use.   Drug use: Never   Sexual activity: Not Currently  Other Topics Concern   Not on file  Social History Narrative   Lives alone   Son lives in [place]   TV, reads, puzzles   Social Determinants of Health   Financial Resource Strain: Not on file  Food Insecurity: Not on file  Transportation Needs: Not on file  Physical Activity: Not on file  Stress: Not on  file  Social Connections: Not on file    Review of Systems: Gen: Denies fever, chills, cold or flulike symptoms, presyncope, syncope.   CV: Denies chest pain, palpitations. Resp: Denies dyspnea or cough. GI: See HPI Derm: Denies rash Psych: Denies depression, anxiety. Heme: See HPI  Physical Exam: BP (!) 154/79    Pulse 83    Temp (!) 96.6 F (35.9 C)    Ht 5\' 2"  (1.575 m)    Wt 207 lb 6.4 oz (94.1 kg)    BMI 37.93 kg/m  General:   Alert and oriented. No distress noted. Pleasant and cooperative.  Head:  Normocephalic and atraumatic. Eyes:  Conjuctiva clear without scleral icterus. Heart:  S1, S2 present without murmurs appreciated. Lungs:  Clear to auscultation bilaterally. No wheezes, rales, or rhonchi. No distress.  Abdomen:  +BS, soft, non-tender and non-distended. No rebound or guarding. No HSM or masses noted.  Msk:  Symmetrical without gross deformities. Normal posture. Extremities:  With trace LE edema. Neurologic:  Alert and  oriented x4. No asterixis.  Psych:  Normal mood and affect.    Assessment:  76 year old female with history of multiple tubular adenomas, H. pylori gastritis s/p treatment with Pylera and confirmed eradication via stool antigen test, IDA with EGD and colonoscopy on file from July 2020, due for surveillance colonoscopy in July 2023, maintaining on oral iron and as needed iron infusions, presenting today at the request of Casey Burkitt, PA-C for further evaluation of nodular liver on recent US.   Cirrhosis:  New diagnosis for patient, likely secondary to fatty liver disease.  She has no signs or symptoms of decompensated liver disease. No significant history of alcohol use.  No major risk factors for viral hepatitis.  No personal or family history of autoimmune conditions.  Reviewed labs and imaging on file.  Ultrasound in November 2022 with slightly nodular liver contour and spleen mildly enlarged.  CT angio chest with contrast at [facility] in December 2021 with nodular liver consistent with cirrhosis.  She also has chronic thrombocytopenia and has had slight elevation of AST  periodically, but most recent LFTs in October 2022 within normal limits.  Also with chronic mild elevation of total bilirubin, most recently 1.7 in October 2022.  Iron panel not consistent with hemochromatosis. No INR on file to calculate MELD.  Reports completing blood work with Dr.  Woody Seller on 1/13 and request we review these results prior to ordering additional blood work.    Ultimately, we will need to rule out viral hepatitis. For completeness, we will also plan to rule out autoimmune conditions and alpha-1 antitrypsin deficiency in the setting of COPD.  She will be due for RUQ ultrasound in May 2023 and needs EGD for variceal screening now.  Last EGD in 2020 (prior to cirrhosis diagnosis) without varices.  IDA:  Chronic.  No overt GI bleeding.  Denies NSAIDs.  Chronically on Eliquis.  Following with hematology and maintained on oral iron every other day along with as needed iron infusions, last infusion in November 2022.  Most recent hemoglobin 12.4 in October 2022 without iron deficiency at that time.  Previously underwent EGD and colonoscopy in July 2020 revealing H. pylori gastritis (s/p treatment and cure) and multiple tubular adenomas, due for surveillance in July 2023.  Due to new diagnosis of cirrhosis as per above, she is due for EGD which we will arrange at this time.  We will plan to see her back in May to arrange for colonoscopy.  As she has continued to need periodic iron infusions, we may need to consider capsule endoscopy in the future to complete GI evaluation pending repeat EGD and colonoscopy this year. I will go ahead and plan to screen for celiac disease.    Plan:  Request recent blood work from Dr. Woody Seller for review.  Will order additional blood work thereafter for routine cirrhosis monitoring, viral hepatitis, autoimmune hepatitis, alpha-1 antitrypsin deficiency, celiac disease. Proceed with EGD with propofol with Dr. [name] in the near future. The risks, benefits, and alternatives  have been discussed with the patient in detail. The patient states understanding and desires to proceed. Hold Eliquis x48 hours prior to procedure. Hold iron x7 days prior to procedure. No diabetes medications morning of procedure. Please note, patient has ILR in place. Nutrition recommendations: High-protein diet from a primarily plant-based diet. Avoid red meat.  No raw or undercooked meat, seafood, or shellfish. Low-fat/cholesterol/carbohydrate diet. Limit sodium to no more than 2000 mg/day including everything that you eat and drink. Recommend at least 30 minutes of aerobic and resistance exercise 3 days/week.  Also recommended propping her legs up when sitting down and using compression stockings to help with minimal peripheral edema and requested she let me know if she has any worsening symptoms or swelling/distention of her abdomen. Advised to monitor for hematochezia, melena, jaundice, mental status changes and let us know. Follow-up in May 2023.  We will discuss arranging colonoscopy at that time.   Aliene Altes, PA-C [facility] Gastroenterology 10/12/2021

## 2021-10-12 ENCOUNTER — Other Ambulatory Visit: Payer: Self-pay

## 2021-10-12 ENCOUNTER — Ambulatory Visit (INDEPENDENT_AMBULATORY_CARE_PROVIDER_SITE_OTHER): Payer: Medicare Other | Admitting: Gastroenterology

## 2021-10-12 ENCOUNTER — Encounter: Payer: Self-pay | Admitting: Gastroenterology

## 2021-10-12 ENCOUNTER — Ambulatory Visit: Payer: Medicare Other | Admitting: Internal Medicine

## 2021-10-12 VITALS — BP 154/79 | HR 83 | Temp 96.6°F | Ht 62.0 in | Wt 207.4 lb

## 2021-10-12 DIAGNOSIS — K7460 Unspecified cirrhosis of liver: Secondary | ICD-10-CM

## 2021-10-12 DIAGNOSIS — Z86010 Personal history of colonic polyps: Secondary | ICD-10-CM

## 2021-10-12 DIAGNOSIS — D509 Iron deficiency anemia, unspecified: Secondary | ICD-10-CM

## 2021-10-12 NOTE — Patient Instructions (Addendum)
We are requesting your recent blood work from Dr. Woody Seller.  I will review this and let you know what additional blood work we need to complete.  We will arrange for you to have an upper endoscopy in the near future with Dr. [name].  As we discussed, I suspect your cirrhosis is likely secondary to history of fatty liver.  Nutrition recommendations:  High-protein diet from a primarily plant-based diet. Avoid red meat.  No raw or undercooked meat, seafood, or shellfish. Low-fat/cholesterol/carbohydrate diet. Limit sodium to no more than 2000 mg/day including everything that you eat and drink. Recommend at least 30 minutes of aerobic and resistance exercise 3 days/week.   To manage her occasional, minimal lower extremity swelling, prop her legs up when you are sitting down, use compression stockings, and follow the strict low-sodium diet as per above.  If he has any worsening of the swelling in your lower legs or notice swelling in your abdomen, please let me know.  Please also let us know if you have any bright red blood per rectum, black stools, yellowing of the eyes or skin, or changes in mental status.  We will plan to see you back in May for follow-up and to discuss scheduling your surveillance colonoscopy.  Please call sooner if you have any questions or concerns.  It was a pleasure meeting you today!  Aliene Altes, PA-C [facility] Gastroenterology

## 2021-10-13 ENCOUNTER — Other Ambulatory Visit: Payer: Self-pay | Admitting: *Deleted

## 2021-10-13 ENCOUNTER — Telehealth: Payer: Self-pay | Admitting: Gastroenterology

## 2021-10-13 DIAGNOSIS — D509 Iron deficiency anemia, unspecified: Secondary | ICD-10-CM

## 2021-10-13 DIAGNOSIS — K7460 Unspecified cirrhosis of liver: Secondary | ICD-10-CM

## 2021-10-13 DIAGNOSIS — R768 Other specified abnormal immunological findings in serum: Secondary | ICD-10-CM

## 2021-10-13 NOTE — Telephone Encounter (Signed)
Message sent to endo to add note regarding CBG

## 2021-10-13 NOTE — Telephone Encounter (Signed)
Noted  

## 2021-10-13 NOTE — Telephone Encounter (Signed)
Received and reviewed recent labs completed 10/03/2021 from PCP.   CMP: Glucose 491 (H), BUN 13, creatinine 0.94, sodium 135, potassium 4.1, chloride 95 (L), calcium 9.8, total protein 6.4, albumin 3.9, total bilirubin 1.7 (H), alk phos 110, AST 35, ALT 44 (H).  Comment made by DNP advised patient to watch her diet and discuss any med changes at A1c visit in a few weeks.  Assumed patient ate before having blood work completed.  Courtney: Please let patient know I reviewed labs completed at PCPs office on 1/9.  Overall, liver enzymes fairly stable with slight elevation of total bilirubin and mildly elevated ALT.  We need to order several additional labs as we discussed at her office visit to evaluate other causes of cirrhosis.  I will also check her for celiac disease due to iron deficiency anemia.  Please arrange CBC, INR, AFP, AMA, ASMA, ANA, immunoglobulins (IgG, IgM, IgA), alpha-1 antitrypsin phenotype, tissue tissue transglutaminase IgA, hepatitis C antibody, hepatitis B surface antigen, hepatitis B core antibody, hepatitis B surface antibody, hepatitis A antibody total.   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  If she presents to endoscopy to have her procedure and her blood glucose is greater than 300, her procedure will likely be canceled.  She needs to let us know if she has any medication changes prior to her endoscopy.   [facility] clinical pool: We need to make sure patient has CBG on day of endoscopy.

## 2021-10-13 NOTE — Telephone Encounter (Signed)
Spoke to pt, informed her of results and recommendations. Informed her that she needed more labs done. Also, advised her about keeping her blood sugars under control. She stated that they have been high for a month now and she doesn't know why. I asked had she informed her PCP and she stated yes. She wanted to cancel procedure, due to high blood sugars.  I asked her to call the office a week before procedure to let me know what her blood sugars are running. Pt voiced understanding. Informed her to call office if she had any problems or concerns. Labs entered into Epic.

## 2021-10-14 ENCOUNTER — Encounter (HOSPITAL_COMMUNITY): Payer: Self-pay | Admitting: Hematology

## 2021-10-19 DIAGNOSIS — N186 End stage renal disease: Secondary | ICD-10-CM | POA: Diagnosis not present

## 2021-10-19 DIAGNOSIS — K7460 Unspecified cirrhosis of liver: Secondary | ICD-10-CM | POA: Diagnosis not present

## 2021-10-23 NOTE — Telephone Encounter (Signed)
Courtney, please call patient to see if she has had her blood work completed.

## 2021-10-24 NOTE — Telephone Encounter (Signed)
Spoke to pt. She informed me that she had labs done on 10/20/21 at [facility]

## 2021-10-25 NOTE — Telephone Encounter (Addendum)
Reviewed labs available in care everywhere.   Hemoglobin within normal limits at 14.5, MCV elevated at 100.5, platelets low at 108. INR within normal limits.  Immunoglobulins within normal limits, ANA and AMA negative.  ASMA was not drawn.  Alpha-1 antitrypsin within normal limits.  Celiac screen negative.  AFP within normal limits.  Hepatitis B core antibody total nonreactive.  Hepatitis C antibody, hepatitis B surface antigen, hepatitis B surface antibody, hepatitis A antibody were not drawn.   Courtney:  Please let patient know I reviewed her labs. Her celiac disease screen is negative. So far, all additional labs drawn to evaluate for other causes of liver disease have been negative. However, there were a few labs left off when she had her labs drawn that we still need to check- ASMA, hepatitis B surface antigen, hepatitis B surface antibody, hepatitis A antibody total. Please re-arrange for these to be completed.

## 2021-10-25 NOTE — Patient Instructions (Signed)
Linda [name]  10/25/2021     @PREFPERIOPPHARMACY @   Your procedure is scheduled on 11/03/2021.   Report to [facility] at  [place].[facility].   Call this number if you have problems the morning of surgery:  [phone]   Remember:  Follow the diet instructions given to you by the office.   Your last does of iron should have been 10/26/2021.    Your last dose of eliquis should be 10/31/2021.      DO NOT take any medications for diabetes the morning of your procedure.      Take these medicines the morning of surgery with A SIP OF WATER                               zyrtec, cymbalta, metoprolol.     Do not wear jewelry, make-up or nail polish.  Do not wear lotions, powders, or perfumes, or deodorant.  Do not shave 48 hours prior to surgery.  Men may shave face and neck.  Do not bring valuables to the hospital.  [facility] is not responsible for any belongings or valuables.  Contacts, dentures or bridgework may not be worn into surgery.  Leave your suitcase in the car.  After surgery it may be brought to your room.  For patients admitted to the hospital, discharge time will be determined by your treatment team.  Patients discharged the day of surgery will not be allowed to drive home and must have someone with them for 24 hours.    Special instructions:   DO NOT smoke tobacco or vape for 24 hours before your procedure.  Please read over the following fact sheets that you were given. Anesthesia Post-op Instructions and Care and Recovery After Surgery      Upper Endoscopy, Adult, Care After This sheet gives you information about how to care for yourself after your procedure. Your health care provider may also give you more specific instructions. If you have problems or questions, contact your health care provider. What can I expect after the procedure? After the procedure, it is common to have: A sore throat. Mild stomach pain or  discomfort. Bloating. Nausea. Follow these instructions at home:  Follow instructions from your health care provider about what to eat or drink after your procedure. Return to your normal activities as told by your health care provider. Ask your health care provider what activities are safe for you. Take over-the-counter and prescription medicines only as told by your health care provider. If you were given a sedative during the procedure, it can affect you for several hours. Do not drive or operate machinery until your health care provider says that it is safe. Keep all follow-up visits as told by your health care provider. This is important. Contact a health care provider if you have: A sore throat that lasts longer than one day. Trouble swallowing. Get help right away if: You vomit blood or your vomit looks like coffee grounds. You have: A fever. Bloody, black, or tarry stools. A severe sore throat or you cannot swallow. Difficulty breathing. Severe pain in your chest or abdomen. Summary After the procedure, it is common to have a sore throat, mild stomach discomfort, bloating, and nausea. If you were given a sedative during the procedure, it can affect you for several hours. Do not drive or operate machinery until your health care provider says that  it is safe. Follow instructions from your health care provider about what to eat or drink after your procedure. Return to your normal activities as told by your health care provider. This information is not intended to replace advice given to you by your health care provider. Make sure you discuss any questions you have with your health care provider. Document Revised: 07/18/2019 Document Reviewed: 02/11/2018 Elsevier Patient Education  2022 [place] After This sheet gives you information about how to care for yourself after your procedure. Your health care provider may also give you more specific  instructions. If you have problems or questions, contact your health care provider. What can I expect after the procedure? After the procedure, it is common to have: Tiredness. Forgetfulness about what happened after the procedure. Impaired judgment for important decisions. Nausea or vomiting. Some difficulty with balance. Follow these instructions at home: For the time period you were told by your health care provider:   Rest as needed. Do not participate in activities where you could fall or become injured. Do not drive or use machinery. Do not drink alcohol. Do not take sleeping pills or medicines that cause drowsiness. Do not make important decisions or sign legal documents. Do not take care of children on your own. Eating and drinking Follow the diet that is recommended by your health care provider. Drink enough fluid to keep your urine pale yellow. If you vomit: Drink water, juice, or soup when you can drink without vomiting. Make sure you have little or no nausea before eating solid foods. General instructions Have a responsible adult stay with you for the time you are told. It is important to have someone help care for you until you are awake and alert. Take over-the-counter and prescription medicines only as told by your health care provider. If you have sleep apnea, surgery and certain medicines can increase your risk for breathing problems. Follow instructions from your health care provider about wearing your sleep device: Anytime you are sleeping, including during daytime naps. While taking prescription pain medicines, sleeping medicines, or medicines that make you drowsy. Avoid smoking. Keep all follow-up visits as told by your health care provider. This is important. Contact a health care provider if: You keep feeling nauseous or you keep vomiting. You feel light-headed. You are still sleepy or having trouble with balance after 24 hours. You develop a rash. You have  a fever. You have redness or swelling around the IV site. Get help right away if: You have trouble breathing. You have new-onset confusion at home. Summary For several hours after your procedure, you may feel tired. You may also be forgetful and have poor judgment. Have a responsible adult stay with you for the time you are told. It is important to have someone help care for you until you are awake and alert. Rest as told. Do not drive or operate machinery. Do not drink alcohol or take sleeping pills. Get help right away if you have trouble breathing, or if you suddenly become confused. This information is not intended to replace advice given to you by your health care provider. Make sure you discuss any questions you have with your health care provider. Document Revised: 05/27/2020 Document Reviewed: 08/14/2019 Elsevier Patient Education  2022 [employer].

## 2021-10-26 ENCOUNTER — Other Ambulatory Visit: Payer: Self-pay | Admitting: *Deleted

## 2021-10-26 DIAGNOSIS — K7460 Unspecified cirrhosis of liver: Secondary | ICD-10-CM

## 2021-10-26 DIAGNOSIS — J449 Chronic obstructive pulmonary disease, unspecified: Secondary | ICD-10-CM | POA: Diagnosis not present

## 2021-10-26 DIAGNOSIS — J9611 Chronic respiratory failure with hypoxia: Secondary | ICD-10-CM | POA: Diagnosis not present

## 2021-10-26 DIAGNOSIS — Z299 Encounter for prophylactic measures, unspecified: Secondary | ICD-10-CM | POA: Diagnosis not present

## 2021-10-26 DIAGNOSIS — I4891 Unspecified atrial fibrillation: Secondary | ICD-10-CM | POA: Diagnosis not present

## 2021-10-26 DIAGNOSIS — I10 Essential (primary) hypertension: Secondary | ICD-10-CM | POA: Diagnosis not present

## 2021-10-26 DIAGNOSIS — E1165 Type 2 diabetes mellitus with hyperglycemia: Secondary | ICD-10-CM | POA: Diagnosis not present

## 2021-10-26 NOTE — Addendum Note (Signed)
Addended by: Inda Castle on: 10/26/2021 04:47 PM   Modules accepted: Orders

## 2021-10-26 NOTE — Telephone Encounter (Addendum)
Spoke to pt, informed her of results. Pt voiced understanding. Pt stated it was some kind of mix up with the lab work and she would go to [facility] and have the rest of them drawn. Pt stated that she has an appointment with PCP today 10/26/21 to evaluate blood sugar. Blood sugar was 363 today. She knows if her blood sugar is to high that the procedure will be cancelled. Labs entered into Epic.

## 2021-10-27 ENCOUNTER — Other Ambulatory Visit: Payer: Self-pay | Admitting: *Deleted

## 2021-10-27 ENCOUNTER — Other Ambulatory Visit (HOSPITAL_COMMUNITY): Payer: Medicare Other

## 2021-10-27 DIAGNOSIS — K7460 Unspecified cirrhosis of liver: Secondary | ICD-10-CM

## 2021-10-28 ENCOUNTER — Telehealth: Payer: Self-pay | Admitting: *Deleted

## 2021-10-28 NOTE — Telephone Encounter (Signed)
Noted  

## 2021-10-28 NOTE — Telephone Encounter (Signed)
Spoke to pt, she informed me that her blood sugar was 573 this morning. I informed the provider. She wants to cancel the procedure.

## 2021-10-28 NOTE — Telephone Encounter (Signed)
Noted. See separate documentation.

## 2021-10-28 NOTE — Telephone Encounter (Signed)
Patient called back today.  Her blood sugar is greater than 500.  She saw primary care on 2/1 and it started her on glipizide.  Reports she has not been eating correctly the last couple of days as her brother just passed away.  She was advised to call her primary care doctor as soon as she got off the phone with me to discuss her elevated blood sugar.  We will have to cancel her upper endoscopy for now due to uncontrolled diabetes and regroup in the near future.  Recommend office visit with an app for Dr. [name] in 2-3 months.  Stacey: Please arrange office visit with an available app or Dr. [name] in 2-3 months.   [facility] clinical pool:  Please cancel patient's procedure scheduled for 2/9.  Madelyn Flavors

## 2021-10-31 ENCOUNTER — Encounter (HOSPITAL_COMMUNITY)
Admission: RE | Admit: 2021-10-31 | Discharge: 2021-10-31 | Disposition: A | Discharge status: Home / Self Care | Payer: Medicare Other | Source: Ambulatory Visit | Attending: Internal Medicine | Admitting: Internal Medicine

## 2021-10-31 ENCOUNTER — Encounter (HOSPITAL_COMMUNITY): Payer: Self-pay

## 2021-10-31 ENCOUNTER — Other Ambulatory Visit (HOSPITAL_COMMUNITY)
Admission: RE | Admit: 2021-10-31 | Discharge: 2021-10-31 | Disposition: A | Discharge status: Home / Self Care | Payer: Medicare Other | Source: Ambulatory Visit | Attending: Internal Medicine | Admitting: Internal Medicine

## 2021-10-31 DIAGNOSIS — E1169 Type 2 diabetes mellitus with other specified complication: Secondary | ICD-10-CM | POA: Diagnosis not present

## 2021-10-31 LAB — BASIC METABOLIC PANEL
Anion gap: 8 (ref 5–15)
BUN: 20 mg/dL (ref 8–23)
CO2: 27 mmol/L (ref 22–32)
Calcium: 10 mg/dL (ref 8.9–10.3)
Chloride: 102 mmol/L (ref 98–111)
Creatinine, Ser: 0.83 mg/dL (ref 0.44–1.00)
GFR, Estimated: >60 mL/min (ref >60)
Glucose, Bld: 225 mg/dL — ABNORMAL HIGH (ref 70–99)
Potassium: 4 mmol/L (ref 3.5–5.1)
Sodium: 137 mmol/L (ref 135–145)

## 2021-11-03 ENCOUNTER — Ambulatory Visit (HOSPITAL_COMMUNITY): Admit: 2021-11-03 | Payer: TRICARE For Life (TFL)

## 2021-11-03 ENCOUNTER — Encounter (HOSPITAL_COMMUNITY): Payer: Self-pay

## 2021-11-03 SURGERY — ESOPHAGOGASTRODUODENOSCOPY (EGD) WITH PROPOFOL
Anesthesia: Monitor Anesthesia Care

## 2021-11-08 DIAGNOSIS — I10 Essential (primary) hypertension: Secondary | ICD-10-CM | POA: Diagnosis not present

## 2021-11-08 DIAGNOSIS — J449 Chronic obstructive pulmonary disease, unspecified: Secondary | ICD-10-CM | POA: Diagnosis not present

## 2021-11-08 DIAGNOSIS — E1165 Type 2 diabetes mellitus with hyperglycemia: Secondary | ICD-10-CM | POA: Diagnosis not present

## 2021-11-08 DIAGNOSIS — E1140 Type 2 diabetes mellitus with diabetic neuropathy, unspecified: Secondary | ICD-10-CM | POA: Diagnosis not present

## 2021-11-08 DIAGNOSIS — Z299 Encounter for prophylactic measures, unspecified: Secondary | ICD-10-CM | POA: Diagnosis not present

## 2021-11-22 DIAGNOSIS — E1165 Type 2 diabetes mellitus with hyperglycemia: Secondary | ICD-10-CM | POA: Diagnosis not present

## 2021-12-14 DIAGNOSIS — I10 Essential (primary) hypertension: Secondary | ICD-10-CM | POA: Diagnosis not present

## 2021-12-14 DIAGNOSIS — Z299 Encounter for prophylactic measures, unspecified: Secondary | ICD-10-CM | POA: Diagnosis not present

## 2021-12-14 DIAGNOSIS — I700 Atherosclerosis of aorta: Secondary | ICD-10-CM | POA: Diagnosis not present

## 2021-12-14 DIAGNOSIS — D6869 Other thrombophilia: Secondary | ICD-10-CM | POA: Diagnosis not present

## 2021-12-14 DIAGNOSIS — E1165 Type 2 diabetes mellitus with hyperglycemia: Secondary | ICD-10-CM | POA: Diagnosis not present

## 2021-12-22 DIAGNOSIS — E1165 Type 2 diabetes mellitus with hyperglycemia: Secondary | ICD-10-CM | POA: Diagnosis not present

## 2021-12-27 DIAGNOSIS — L299 Pruritus, unspecified: Secondary | ICD-10-CM | POA: Diagnosis not present

## 2021-12-27 DIAGNOSIS — L905 Scar conditions and fibrosis of skin: Secondary | ICD-10-CM | POA: Diagnosis not present

## 2021-12-27 DIAGNOSIS — D1801 Hemangioma of skin and subcutaneous tissue: Secondary | ICD-10-CM | POA: Diagnosis not present

## 2022-01-11 DIAGNOSIS — Z9842 Cataract extraction status, left eye: Secondary | ICD-10-CM | POA: Diagnosis not present

## 2022-01-11 DIAGNOSIS — Z961 Presence of intraocular lens: Secondary | ICD-10-CM | POA: Diagnosis not present

## 2022-01-11 DIAGNOSIS — E119 Type 2 diabetes mellitus without complications: Secondary | ICD-10-CM | POA: Diagnosis not present

## 2022-01-11 DIAGNOSIS — H35373 Puckering of macula, bilateral: Secondary | ICD-10-CM | POA: Diagnosis not present

## 2022-01-11 DIAGNOSIS — H179 Unspecified corneal scar and opacity: Secondary | ICD-10-CM | POA: Diagnosis not present

## 2022-01-22 DIAGNOSIS — E1165 Type 2 diabetes mellitus with hyperglycemia: Secondary | ICD-10-CM | POA: Diagnosis not present

## 2022-01-26 DIAGNOSIS — Z299 Encounter for prophylactic measures, unspecified: Secondary | ICD-10-CM | POA: Diagnosis not present

## 2022-01-26 DIAGNOSIS — E1165 Type 2 diabetes mellitus with hyperglycemia: Secondary | ICD-10-CM | POA: Diagnosis not present

## 2022-01-26 DIAGNOSIS — I10 Essential (primary) hypertension: Secondary | ICD-10-CM | POA: Diagnosis not present

## 2022-01-26 DIAGNOSIS — Z789 Other specified health status: Secondary | ICD-10-CM | POA: Diagnosis not present

## 2022-01-27 ENCOUNTER — Inpatient Hospital Stay (HOSPITAL_COMMUNITY): Payer: Medicare Other

## 2022-01-30 ENCOUNTER — Inpatient Hospital Stay (HOSPITAL_COMMUNITY): Payer: Medicare Other | Attending: Hematology

## 2022-01-30 DIAGNOSIS — Z7901 Long term (current) use of anticoagulants: Secondary | ICD-10-CM | POA: Diagnosis not present

## 2022-01-30 DIAGNOSIS — Z7951 Long term (current) use of inhaled steroids: Secondary | ICD-10-CM | POA: Insufficient documentation

## 2022-01-30 DIAGNOSIS — F32A Depression, unspecified: Secondary | ICD-10-CM | POA: Insufficient documentation

## 2022-01-30 DIAGNOSIS — R5383 Other fatigue: Secondary | ICD-10-CM | POA: Insufficient documentation

## 2022-01-30 DIAGNOSIS — D509 Iron deficiency anemia, unspecified: Secondary | ICD-10-CM | POA: Diagnosis not present

## 2022-01-30 DIAGNOSIS — I4891 Unspecified atrial fibrillation: Secondary | ICD-10-CM | POA: Insufficient documentation

## 2022-01-30 DIAGNOSIS — D696 Thrombocytopenia, unspecified: Secondary | ICD-10-CM | POA: Diagnosis not present

## 2022-01-30 DIAGNOSIS — R911 Solitary pulmonary nodule: Secondary | ICD-10-CM | POA: Diagnosis not present

## 2022-01-30 DIAGNOSIS — Z7984 Long term (current) use of oral hypoglycemic drugs: Secondary | ICD-10-CM | POA: Insufficient documentation

## 2022-01-30 DIAGNOSIS — Z79899 Other long term (current) drug therapy: Secondary | ICD-10-CM | POA: Diagnosis not present

## 2022-01-30 DIAGNOSIS — D500 Iron deficiency anemia secondary to blood loss (chronic): Secondary | ICD-10-CM

## 2022-01-30 LAB — CBC WITH DIFFERENTIAL/PLATELET
Abs Immature Granulocytes: 0.01 10*3/uL (ref 0.00–0.07)
Basophils Absolute: 0 10*3/uL (ref 0.0–0.1)
Basophils Relative: 0 %
Eosinophils Absolute: 0.1 10*3/uL (ref 0.0–0.5)
Eosinophils Relative: 3 %
HCT: 41.4 % (ref 36.0–46.0)
Hemoglobin: 13.3 g/dL (ref 12.0–15.0)
Immature Granulocytes: 0 %
Lymphocytes Relative: 16 %
Lymphs Abs: 0.8 10*3/uL (ref 0.7–4.0)
MCH: 33.5 pg (ref 26.0–34.0)
MCHC: 32.1 g/dL (ref 30.0–36.0)
MCV: 104.3 fL — ABNORMAL HIGH (ref 80.0–100.0)
Monocytes Absolute: 0.3 10*3/uL (ref 0.1–1.0)
Monocytes Relative: 6 %
Neutro Abs: 3.5 10*3/uL (ref 1.7–7.7)
Neutrophils Relative %: 75 %
Platelets: 92 10*3/uL — ABNORMAL LOW (ref 150–400)
RBC: 3.97 MIL/uL (ref 3.87–5.11)
RDW: 14.3 % (ref 11.5–15.5)
WBC: 4.7 10*3/uL (ref 4.0–10.5)
nRBC: 0 % (ref 0.0–0.2)

## 2022-01-30 LAB — VITAMIN B12: Vitamin B-12: 2254 pg/mL — ABNORMAL HIGH (ref 180–914)

## 2022-01-30 LAB — IRON AND TIBC
Iron: 215 ug/dL — ABNORMAL HIGH (ref 28–170)
Saturation Ratios: 53 % — ABNORMAL HIGH (ref 10.4–31.8)
TIBC: 403 ug/dL (ref 250–450)
UIBC: 188 ug/dL

## 2022-01-30 LAB — FERRITIN: Ferritin: 22 ng/mL (ref 11–307)

## 2022-01-30 LAB — FOLATE: Folate: 34.5 ng/mL (ref >5.9)

## 2022-02-01 LAB — METHYLMALONIC ACID, SERUM: Methylmalonic Acid, Quantitative: 208 nmol/L (ref 0–378)

## 2022-02-02 NOTE — Progress Notes (Signed)
? ?[facility] ?[address] ?[place], [place] [address] ? ? ?CLINIC:  ?Medical Oncology/Hematology ? ?PCP:  ?[name], MD ?[address] ?[place] [address] ?[phone] ? ? ?REASON FOR VISIT:  ?Follow-up for iron deficiency anemia and thrombocytopenia ? ?CURRENT THERAPY: Intermittent IV iron ? ?INTERVAL HISTORY:  ?Ms. Raybuck 76 y.o. female returns for routine follow-up of her iron deficiency anemia and thrombocytopenia.  She was last evaluated via telemedicine visit by Tarri Abernethy PA-C on 08/09/2021. ? ?She reports that after her IV iron in November she was feeling great, but over the past month she has had some worsening fatigue as well as the onset of pica cravings for ice.  She denies any bright red blood per rectum, melena, or epistaxis.  She continues to bruise easily but denies any petechial rash.  She is on Eliquis for atrial fibrillation.  She has occasional headaches.  She denies any restless legs, chest pain, dyspnea on exertion, lightheadedness, or syncope.  No B symptoms such as fever, chills, night sweats, or unintentional weight loss.  She remains on her iron tablet every other day. ? ?She reports little to no energy and 100% appetite.  She endorses that she is maintaining a stable weight at this time. ? ? ? ?REVIEW OF SYSTEMS:  ?Review of Systems  ?Constitutional:  Positive for fatigue. Negative for appetite change, chills, diaphoresis, fever and unexpected weight change.  ?HENT:   Negative for lump/mass and nosebleeds.   ?Eyes:  Negative for eye problems.  ?Respiratory:  Negative for cough, hemoptysis and shortness of breath.   ?Cardiovascular:  Negative for chest pain, leg swelling and palpitations.  ?Gastrointestinal:  Negative for abdominal pain, blood in stool, constipation, diarrhea, nausea and vomiting.  ?Genitourinary:  Negative for hematuria.   ?Skin: Negative.   ?Neurological:  Positive for numbness. Negative for dizziness, headaches and light-headedness.   ?Hematological:  Does not bruise/bleed easily.   ? ? ?PAST MEDICAL/SURGICAL HISTORY:  ?Past Medical History:  ?Diagnosis Date  ? Allergy   ? Anemia 07/25/2017  ? Anxiety   ? Arthritis   ? Atrial fibrillation [facility])   ? s/p ablation in March 2022, ILP remains in place  ? Cataract   ? COPD (chronic obstructive pulmonary disease) ([place])   ? Depression   ? Diabetes mellitus without complication ([place])   ? Emphysema of lung ([place])   ? GERD (gastroesophageal reflux disease) 10/28/2020  ? Hyperlipidemia   ? Hypertension   ? Oxygen deficiency   ? Sleep apnea   ? Typical atrial flutter ([place])   ? ?Past Surgical History:  ?Procedure Laterality Date  ? A-FLUTTER ABLATION N/A 10/04/2017  ? Procedure: A-FLUTTER ABLATION;  Surgeon: Thompson Grayer, MD;  Location: [place] CV LAB;  Service: Cardiovascular;  Laterality: N/A;  ? ABDOMINAL HYSTERECTOMY    ? fibroids  ? ATRIAL FIBRILLATION ABLATION N/A 12/09/2020  ? Procedure: ATRIAL FIBRILLATION ABLATION;  Surgeon: Thompson Grayer, MD;  Location: [place] CV LAB;  Service: Cardiovascular;  Laterality: N/A;  ? blood clot removal from base of brain  1990  ? CARDIOVERSION N/A 10/04/2020  ? Procedure: CARDIOVERSION;  Surgeon: Freada Bergeron, MD;  Location: [facility] ENDOSCOPY;  Service: Cardiovascular;  Laterality: N/A;  ? CARPAL TUNNEL RELEASE    ? CATARACT EXTRACTION W/PHACO Left 06/16/2013  ? Procedure: CATARACT EXTRACTION PHACO AND INTRAOCULAR LENS PLACEMENT (IOC);  Surgeon: Tonny Branch, MD;  Location: [facility] ORS;  Service: Ophthalmology;  Laterality: Left;  CDE:  12.18  ?  CATARACT EXTRACTION W/PHACO Right 06/26/2013  ? Procedure: CATARACT EXTRACTION PHACO AND INTRAOCULAR LENS PLACEMENT (IOC);  Surgeon: Tonny Branch, MD;  Location: [facility] ORS;  Service: Ophthalmology;  Laterality: Right;  CDE:14.71  ? CESAREAN SECTION    ? CHOLECYSTECTOMY    ? COLONOSCOPY WITH PROPOFOL N/A 04/08/2019  ? Dr.Fields: diverticulosis, 11 simple adenomas removed, hemorrhoids. next colonoscopy in 3 years.   ? DILATION AND  CURETTAGE OF UTERUS    ? ESOPHAGOGASTRODUODENOSCOPY (EGD) WITH PROPOFOL N/A 04/08/2019  ? Dr. Oneida Alar: H.pylori gastritis  ? EYE SURGERY  12/2016  ? FOOT NEUROMA SURGERY Right   ? LOOP RECORDER INSERTION N/A 11/06/2017  ? Procedure: LOOP RECORDER INSERTION;  Surgeon: Thompson Grayer, MD;  Location: [place] CV LAB;  Service: Cardiovascular;  Laterality: N/A;  ? MOUTH SURGERY    ? POLYPECTOMY  04/08/2019  ? Procedure: POLYPECTOMY;  Surgeon: Danie Binder, MD;  Location: [facility] ENDO SUITE;  Service: Endoscopy;;  ? TEE WITHOUT CARDIOVERSION N/A 12/09/2020  ? Procedure: TRANSESOPHAGEAL ECHOCARDIOGRAM (TEE);  Surgeon: Lelon Perla, MD;  Location: [facility] ENDOSCOPY;  Service: Cardiovascular;  Laterality: N/A;  ? TONSILLECTOMY    ? ? ? ?SOCIAL HISTORY:  ?Social History  ? ?Socioeconomic History  ? Marital status: Widowed  ?  Spouse name: Not on file  ? Number of children: 1  ? Years of education: 80  ? Highest education level: Not on file  ?Occupational History  ? Occupation: retired  ?  Comment: catering, [employer]  ?Tobacco Use  ? Smoking status: Former  ?  Packs/day: 1.50  ?  Types: Cigarettes  ?  Start date: 09/25/1958  ?  Quit date: 06/06/2001  ?  Years since quitting: 20.6  ? Smokeless tobacco: Never  ?Vaping Use  ? Vaping Use: Never used  ?Substance and Sexual Activity  ? Alcohol use: Not Currently  ?  Comment: used to drink an occasional alcoholic beverage. No hyistory of heavy alcohol use.  ? Drug use: Never  ? Sexual activity: Not Currently  ?Other Topics Concern  ? Not on file  ?Social History Narrative  ? Lives alone  ? Son lives in [place]  ? TV, reads, puzzles  ? ?Social Determinants of Health  ? ?Financial Resource Strain: Not on file  ?Food Insecurity: Not on file  ?Transportation Needs: Not on file  ?Physical Activity: Not on file  ?Stress: Not on file  ?Social Connections: Not on file  ?Intimate Partner Violence: Not on file  ? ? ?FAMILY HISTORY:  ?Family History  ?Problem Relation Age of Onset  ? Emphysema  Mother   ? Alcohol abuse Mother   ? Arthritis Mother   ? COPD Mother   ? Depression Mother   ? Hyperlipidemia Mother   ? Hypertension Mother   ? Heart attack Father   ? Cancer Father   ?     lung  ? Heart disease Father   ? Diabetes Brother   ? COPD Brother   ? Other Maternal Grandmother   ?     swine flu 1919  ? Colon cancer Neg Hx   ? Colon polyps Neg Hx   ? Liver disease Neg Hx   ? ? ?CURRENT MEDICATIONS:  ?Outpatient Encounter Medications as of 02/03/2022  ?Medication Sig  ? acetaminophen (TYLENOL) 500 MG tablet Take 500 mg by mouth every 6 (six) hours as needed for moderate pain or headache.  ? ADVAIR DISKUS 250-50 MCG/DOSE AEPB Inhale 1 puff into the lungs 2 (two) times daily.  ?  apixaban (ELIQUIS) 5 MG TABS tablet Take 1 tablet (5 mg total) by mouth 2 (two) times daily.  ? Ascorbic Acid (VITAMIN C) 1000 MG tablet Take 1,000 mg by mouth daily.  ? calcium carbonate (TUMS - DOSED IN MG ELEMENTAL CALCIUM) 500 MG chewable tablet Chew 1 tablet by mouth at bedtime as needed for indigestion or heartburn.  ? calcium-vitamin D (OSCAL WITH D) 500-200 MG-UNIT tablet Take 1 tablet by mouth daily.  ? cetirizine (ZYRTEC) 10 MG tablet Take 10 mg by mouth daily.  ? Cholecalciferol (VITAMIN D3 SUPER STRENGTH) 50 MCG (2000 UT) TABS Take 2,000 Units by mouth every other day.  ? CINNAMON PO Take 2 capsules by mouth at bedtime.  ? DULoxetine (CYMBALTA) 30 MG capsule Take 30 mg by mouth daily.  ? ferrous sulfate 325 (65 FE) MG EC tablet Take 325 mg by mouth every other day.  ? Melatonin 10 MG TABS Take 10 mg by mouth at bedtime.  ? metFORMIN (GLUCOPHAGE) 500 MG tablet Take 500-1,000 mg by mouth See admin instructions. Take 1000 mg by mouth in the morning and 500 mg at night  ? metoprolol succinate (TOPROL-XL) 25 MG 24 hr tablet Take 1 tablet (25 mg total) by mouth daily. Dose decreased 03/25/2021 - PLACE ON FILE - PATIENT WILL CALL WHEN SHE NEEDS  ? Multiple Vitamins-Minerals (ONE DAILY CALCIUM/IRON) TABS Take 1 tablet by mouth daily.   ? OXYGEN Inhale 2 L into the lungs at bedtime. With BPAP  ? pravastatin (PRAVACHOL) 20 MG tablet Take 1 tablet (20 mg total) daily by mouth.  ? PROAIR HFA 108 (90 Base) MCG/ACT inhaler Inhale 2 puffs into the lungs

## 2022-02-03 ENCOUNTER — Inpatient Hospital Stay (HOSPITAL_BASED_OUTPATIENT_CLINIC_OR_DEPARTMENT_OTHER): Payer: Medicare Other | Admitting: Physician Assistant

## 2022-02-03 VITALS — Ht 62.0 in | Wt 211.9 lb

## 2022-02-03 DIAGNOSIS — D500 Iron deficiency anemia secondary to blood loss (chronic): Secondary | ICD-10-CM

## 2022-02-03 DIAGNOSIS — D696 Thrombocytopenia, unspecified: Secondary | ICD-10-CM

## 2022-02-03 DIAGNOSIS — E538 Deficiency of other specified B group vitamins: Secondary | ICD-10-CM | POA: Diagnosis not present

## 2022-02-03 DIAGNOSIS — D509 Iron deficiency anemia, unspecified: Secondary | ICD-10-CM | POA: Diagnosis not present

## 2022-02-03 DIAGNOSIS — R5383 Other fatigue: Secondary | ICD-10-CM | POA: Diagnosis not present

## 2022-02-03 DIAGNOSIS — I4891 Unspecified atrial fibrillation: Secondary | ICD-10-CM | POA: Diagnosis not present

## 2022-02-03 DIAGNOSIS — R911 Solitary pulmonary nodule: Secondary | ICD-10-CM | POA: Diagnosis not present

## 2022-02-03 DIAGNOSIS — F32A Depression, unspecified: Secondary | ICD-10-CM | POA: Diagnosis not present

## 2022-02-03 NOTE — Patient Instructions (Signed)
[place] at [facility] ?Discharge Instructions ? ?You were seen today by Tarri Abernethy PA-C for your iron deficiency anemia and low platelets. ? ?Your blood levels look great (no anemia), but your iron levels are low.  This is likely why you are feeling so tired.  We will schedule you for IV iron x2 doses. ? ?Your platelet levels remain somewhat low, which I believe is related to your liver disease.  You do not need treatment at this time, but we will continue to monitor these levels going forward. ? ?You can STOP your vitamin B12 for the time being.  We will recheck these levels at your next visit. ? ?FOLLOW-UP APPOINTMENT: Labs and office visit in 6 months ? ? ?Thank you for choosing [place] at [facility] to provide your oncology and hematology care.  To afford each patient quality time with our provider, please arrive at least 15 minutes before your scheduled appointment time.  ? ?If you have a lab appointment with the [facility] please come in thru the Main Entrance and check in at the main information desk. ? ?You need to re-schedule your appointment should you arrive 10 or more minutes late.  We strive to give you quality time with our providers, and arriving late affects you and other patients whose appointments are after yours.  Also, if you no show three or more times for appointments you may be dismissed from the clinic at the providers discretion.     ?Again, thank you for choosing [facility].  Our hope is that these requests will decrease the amount of time that you wait before being seen by our physicians.       ?_____________________________________________________________ ? ?Should you have questions after your visit to [facility], please contact our office at [phone] and follow the prompts.  Our office hours are 8:00 a.m. and 4:30 p.m. Monday - Friday.  Please note that voicemails left after 4:00 p.m. may  not be returned until the following business day.  We are closed weekends and major holidays.  You do have access to a nurse 24-7, just call the main number to the clinic [phone] and do not press any options, hold on the line and a nurse will answer the phone.   ? ?For prescription refill requests, have your pharmacy contact our office and allow 72 hours.   ? ?Due to Covid, you will need to wear a mask upon entering the hospital. If you do not have a mask, a mask will be given to you at the Main Entrance upon arrival. For doctor visits, patients may have 1 support person age 68 or older with them. For treatment visits, patients can not have anyone with them due to social distancing guidelines and our immunocompromised population.  ? ? ? ?

## 2022-02-07 ENCOUNTER — Encounter (HOSPITAL_COMMUNITY): Payer: Self-pay

## 2022-02-07 ENCOUNTER — Inpatient Hospital Stay (HOSPITAL_COMMUNITY): Payer: Medicare Other

## 2022-02-07 VITALS — BP 108/64 | HR 71 | Temp 96.3°F | Resp 20

## 2022-02-07 DIAGNOSIS — R911 Solitary pulmonary nodule: Secondary | ICD-10-CM | POA: Diagnosis not present

## 2022-02-07 DIAGNOSIS — D509 Iron deficiency anemia, unspecified: Secondary | ICD-10-CM

## 2022-02-07 DIAGNOSIS — D696 Thrombocytopenia, unspecified: Secondary | ICD-10-CM | POA: Diagnosis not present

## 2022-02-07 DIAGNOSIS — I4891 Unspecified atrial fibrillation: Secondary | ICD-10-CM | POA: Diagnosis not present

## 2022-02-07 DIAGNOSIS — R5383 Other fatigue: Secondary | ICD-10-CM | POA: Diagnosis not present

## 2022-02-07 DIAGNOSIS — F32A Depression, unspecified: Secondary | ICD-10-CM | POA: Diagnosis not present

## 2022-02-07 MED ORDER — ACETAMINOPHEN 325 MG PO TABS
650.0000 mg | ORAL_TABLET | Freq: Once | ORAL | Status: AC
  Administered 2022-02-07: 650 mg via ORAL
  Filled 2022-02-07: qty 2

## 2022-02-07 MED ORDER — SODIUM CHLORIDE 0.9 % IV SOLN: Freq: Once | INTRAVENOUS | Status: AC

## 2022-02-07 MED ORDER — SODIUM CHLORIDE 0.9 % IV SOLN
510.0000 mg | Freq: Once | INTRAVENOUS | Status: AC
  Administered 2022-02-07: 510 mg via INTRAVENOUS
  Filled 2022-02-07: qty 510

## 2022-02-07 NOTE — Progress Notes (Signed)
Referring Provider: [name], MD Primary Care Physician:  [name], MD Primary GI Physician: Dr. [name]  Chief Complaint  Patient presents with   Follow-up    No current issues to discuss. States that her sugar levels are better now.     HPI:   Linda [name] is a 76 y.o. female with history of presenting today for follow-up of cirrhosis, IDA, and to discuss rescheduling EGD and to schedule surveillance colonoscopy.   She has history of multiple tubular adenomas, H. pylori gastritis s/p treatment with Pylera and confirmed eradication via stool antigen test, IDA with EGD and colonoscopy on file from July 2020, due for surveillance colonoscopy in July 2023. She follows with hematology, is maintained on oral iron daily and received IV iron periodically, most recently on 02/07/2022.  Most recent hemoglobin on 5/8 was 13.3 with elevated MCV.  Iron saturation elevated, but ferritin 22.  Last seen in our office 10/12/2021 for evaluation of cirrhosis.  Per chart review, nodular liver was first noted on outside CT in December 2021.  Recent ultrasound with slightly nodular liver and splenomegaly.  Noted chronic thrombocytopenia.  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  Needed to update MELD labs, but patient requested that I review labs completed with PCP first.  Also recommended EGD for variceal screening.  Regarding her history of IDA, she had no overt GI bleeding.  Plan to screen for celiac disease.  She continued to receive intermittent iron infusions, could consider capsule study in the future pending repeat EGD and colonoscopy this year.   Reviewed labs completed with PCP on 10/03/2021.  Glucose was elevated at 491, bilirubin mildly elevated at 1.7, ALT slightly elevated at 44. Additional blood work ordered.  INR within normal limits.  Immunoglobulins within normal limits, ANA and AMA negative.   ASMA was not drawn.  Alpha-1 antitrypsin within normal limits.  Celiac screen negative.  AFP within normal limits.  Hepatitis B core antibody total nonreactive.  Hepatitis C antibody, hepatitis B surface antigen, hepatitis B surface antibody, hepatitis A antibody were not drawn. Labs reordered, but not yet completed.   Ultimately, due to uncontrolled diabetes with blood sugar greater than 500, EGD was canceled.  Recommended calling PCP ASAP to arrange follow-up and office visit with [place] in 2 to 3 months to regroup.  Today:  Diabetes under better control. Glucose was 90 this morning. Taking 3 medications, metformin, glipizide, and was started on a new medication by Dr. Woody Seller, but she is unable to remember the name of the medication.  She is receiving samples. Orland Jarred, CMA spoke with Dr. Ennis Forts office and confirmed patient was taking Farxiga 10 mg, 1/2 tablet daily.  Gets heartburn/burning around the sternal notch after taking medication just before bedtime unless she eats something with it.  Also with occasional pill dysphagia a few times a month. Started a few months ago.  Does not drink much water with her medications at bedtime.  No dysphagia to foods or liquids.  Denies abdominal distention, yellowing of the eyes.  She has chronic intermittent mild lower extremity edema when she is up on her feet or sitting for long periods of time.  Resolves overnight.  Denies mental status changes/confusion.  MELD Na 8 based on labs in January.   Denies bowel habit changes, constipation, diarrhea, BRBPR, melena, hematuria, vaginal bleeding, epistaxis.  Most recent hemoglobin was 13.3 on 5/8.  Iron  panel at that time with iron 215, saturation 53, ferritin 22.  Her last iron infusion was on 5/16.  Prior to this, her last infusion was in November 2022.  She takes oral iron every other day.   Labs: Routine labs due in July [place]: Due now Hep A/B vaccination:  EGD: Due now  Past Medical History:  Diagnosis Date    Allergy    Anemia 07/25/2017   Anxiety    Arthritis    Atrial fibrillation ([place])    s/p ablation in March 2022, ILP remains in place   Cataract    COPD (chronic obstructive pulmonary disease) ([place])    Depression    Diabetes mellitus without complication ([place])    Emphysema of lung ([place])    GERD (gastroesophageal reflux disease) 10/28/2020   Hyperlipidemia    Hypertension    Oxygen deficiency    Sleep apnea    Typical atrial flutter ([place])     Past Surgical History:  Procedure Laterality Date   A-FLUTTER ABLATION N/A 10/04/2017   Procedure: A-FLUTTER ABLATION;  Surgeon: Thompson Grayer, MD;  Location: [facility];  Service: Cardiovascular;  Laterality: N/A;   ABDOMINAL HYSTERECTOMY     fibroids   ATRIAL FIBRILLATION ABLATION N/A 12/09/2020   Procedure: ATRIAL FIBRILLATION ABLATION;  Surgeon: Thompson Grayer, MD;  Location: [facility];  Service: Cardiovascular;  Laterality: N/A;   blood clot removal from base of brain  1990   CARDIOVERSION N/A 10/04/2020   Procedure: CARDIOVERSION;  Surgeon: Freada Bergeron, MD;  Location: [facility];  Service: Cardiovascular;  Laterality: N/A;   CARPAL TUNNEL RELEASE     CATARACT EXTRACTION W/PHACO Left 06/16/2013   Procedure: CATARACT EXTRACTION PHACO AND INTRAOCULAR LENS PLACEMENT ([place]);  Surgeon: Tonny Branch, MD;  Location: [facility];  Service: Ophthalmology;  Laterality: Left;  CDE:  12.18   CATARACT EXTRACTION W/PHACO Right 06/26/2013   Procedure: CATARACT EXTRACTION PHACO AND INTRAOCULAR LENS PLACEMENT ([facility]);  Surgeon: Tonny Branch, MD;  Location: [facility];  Service: Ophthalmology;  Laterality: Right;  CDE:14.71   CESAREAN SECTION     CHOLECYSTECTOMY     COLONOSCOPY WITH PROPOFOL N/A 04/08/2019   Dr.Fields: diverticulosis, 11 simple adenomas removed, hemorrhoids. next colonoscopy in 3 years.    DILATION AND CURETTAGE OF UTERUS     ESOPHAGOGASTRODUODENOSCOPY (EGD) WITH PROPOFOL N/A 04/08/2019   Dr. Oneida Alar: H.pylori gastritis   EYE  SURGERY  12/2016   FOOT NEUROMA SURGERY Right    LOOP RECORDER INSERTION N/A 11/06/2017   Procedure: LOOP RECORDER INSERTION;  Surgeon: Thompson Grayer, MD;  Location: [facility];  Service: Cardiovascular;  Laterality: N/A;   MOUTH SURGERY     POLYPECTOMY  04/08/2019   Procedure: POLYPECTOMY;  Surgeon: Danie Binder, MD;  [facility];  Service: Endoscopy;;   TEE WITHOUT CARDIOVERSION N/A 12/09/2020   Procedure: TRANSESOPHAGEAL ECHOCARDIOGRAM (TEE);  Surgeon: Lelon Perla, MD;  Location: [facility];  Service: Cardiovascular;  Laterality: N/A;   TONSILLECTOMY      Current Outpatient Medications  Medication Sig Dispense Refill   acetaminophen (TYLENOL) 500 MG tablet Take 500 mg by mouth every 6 (six) hours as needed for moderate pain or headache.     ADVAIR DISKUS 250-50 MCG/DOSE AEPB Inhale 1 puff into the lungs 2 (two) times daily.     apixaban (ELIQUIS) 5 MG TABS tablet Take 1 tablet (5 mg total) by mouth 2 (two) times daily. 180 tablet 3   Ascorbic Acid (VITAMIN C)  1000 MG tablet Take 1,000 mg by mouth daily.     calcium carbonate (TUMS - DOSED IN MG ELEMENTAL CALCIUM) 500 MG chewable tablet Chew 1 tablet by mouth at bedtime as needed for indigestion or heartburn.     calcium-vitamin D (OSCAL WITH D) 500-200 MG-UNIT tablet Take 1 tablet by mouth daily.     cetirizine (ZYRTEC) 10 MG tablet Take 10 mg by mouth daily.     Cholecalciferol (VITAMIN D3 SUPER STRENGTH) 50 MCG (2000 UT) TABS Take 2,000 Units by mouth every other day.     CINNAMON PO Take 2 capsules by mouth at bedtime.     dapagliflozin propanediol (FARXIGA) 10 MG TABS tablet Take 5 mg by mouth daily.     DULoxetine (CYMBALTA) 30 MG capsule Take 30 mg by mouth daily.     ferrous sulfate 325 (65 FE) MG EC tablet Take 325 mg by mouth every other day.     FREESTYLE LITE test strip      glipiZIDE (GLUCOTROL) 5 MG tablet Take 5 mg by mouth 2 (two) times daily.     Melatonin 10 MG TABS Take 10 mg by mouth at  bedtime.     metFORMIN (GLUCOPHAGE) 500 MG tablet Take 500-1,000 mg by mouth See admin instructions. Take 1000 mg by mouth in the morning and 500 mg at night     metoprolol succinate (TOPROL-XL) 25 MG 24 hr tablet Take 1 tablet (25 mg total) by mouth daily. Dose decreased 03/25/2021 - PLACE ON FILE - PATIENT WILL CALL WHEN SHE NEEDS 90 tablet 3   Multiple Vitamins-Minerals (ONE DAILY CALCIUM/IRON) TABS Take 1 tablet by mouth daily.     OXYGEN Inhale 2 L into the lungs at bedtime. With BPAP     pravastatin (PRAVACHOL) 20 MG tablet Take 1 tablet (20 mg total) daily by mouth. 90 tablet 3   PROAIR HFA 108 (90 Base) MCG/ACT inhaler Inhale 2 puffs into the lungs every 4 (four) hours as needed for wheezing or shortness of breath.     Probiotic Product (PROBIOTIC DAILY PO) Take 1 capsule by mouth daily.      Red Yeast Rice Extract (RED YEAST RICE PO) Take 1 capsule by mouth daily.     polyethylene glycol-electrolytes (TRILYTE) 420 g solution Take 4,000 mLs by mouth as directed. 4000 mL 0   vitamin B-12 (CYANOCOBALAMIN) 1000 MCG tablet Take 1,000 mcg by mouth every other day. (Patient not taking: Reported on 02/09/2022)     No current facility-administered medications for this visit.    Allergies as of 02/09/2022 - Review Complete 02/09/2022  Allergen Reaction Noted   Trazodone and nefazodone Hives and Other (See Comments) 01/10/2017   Benicar [olmesartan]  11/30/2020   Nickel Other (See Comments) 10/04/2017   Tetanus toxoids Other (See Comments) 06/28/2018   Adhesive [tape] Itching 06/09/2013   Bactrim [sulfamethoxazole-trimethoprim] Itching and Rash 06/09/2013   Penicillins Itching, Rash, and Other (See Comments) 06/09/2013   Ppd [tuberculin purified protein derivative] Swelling and Other (See Comments) 06/09/2013   Sulfamethoxazole-trimethoprim Itching and Rash 06/09/2013    Family History  Problem Relation Age of Onset   Emphysema Mother    Alcohol abuse Mother    Arthritis Mother    COPD  Mother    Depression Mother    Hyperlipidemia Mother    Hypertension Mother    Heart attack Father    Cancer Father        lung   Heart disease Father    Diabetes  Brother    COPD Brother    Other Maternal Grandmother        swine flu 1919   Colon cancer Neg Hx    Colon polyps Neg Hx    Liver disease Neg Hx     Social History   Socioeconomic History   Marital status: Widowed    Spouse name: Not on file   Number of children: 1   Years of education: 45   Highest education level: Not on file  Occupational History   Occupation: retired    Comment: catering, Engineer, maintenance  Tobacco Use   Smoking status: Former    Packs/day: 1.50    Types: Cigarettes    Start date: 09/25/1958    Quit date: 06/06/2001    Years since quitting: 20.6   Smokeless tobacco: Never  Vaping Use   Vaping Use: Never used  Substance and Sexual Activity   Alcohol use: Not Currently    Comment: used to drink an occasional alcoholic beverage. No hyistory of heavy alcohol use.   Drug use: Never   Sexual activity: Not Currently  Other Topics Concern   Not on file  Social History Narrative   Lives alone   Son lives in [place]   TV, reads, puzzles   Social Determinants of Health   Financial Resource Strain: Not on file  Food Insecurity: Not on file  Transportation Needs: Not on file  Physical Activity: Not on file  Stress: Not on file  Social Connections: Not on file    Review of Systems: Gen: Denies fever, chills, cold or flu like symptoms, pre-syncope, or syncope.  CV: Denies chest pain, palpitations.  Resp: Denies dyspnea or cough.  GI: See HPI Heme: See HPI  Physical Exam: BP 110/62 (BP Location: Right Arm, Patient Position: Sitting, Cuff Size: Normal)   Pulse 76   Temp (!) 97.3 F (36.3 C) (Temporal)   Ht '5\' 2"'$  (1.575 m)   Wt 214 lb 12.8 oz (97.4 kg)   SpO2 95%   BMI 39.29 kg/m  General:   Alert and oriented. No distress noted. Pleasant and cooperative.  Head:  Normocephalic and  atraumatic. Eyes:  Conjuctiva clear without scleral icterus. Heart:  S1, S2 present without murmurs appreciated. Lungs:  Clear to auscultation bilaterally. No wheezes, rales, or rhonchi. No distress.  Abdomen:  +BS, soft, non-tender and non-distended. No rebound or guarding. No HSM or masses noted. Msk:  Symmetrical without gross deformities. Normal posture. Extremities:  Trace LE edema. Neurologic:  Alert and  oriented x4 Psych:  Normal mood and affect.    Assessment:  76 year old female with history of atrial fibrillation on Eliquis, COPD, type 2 diabetes, HTN, HLD, sleep apnea cirrhosis, colon polyps, presenting today for follow-up to discuss scheduling EGD and surveillance colonoscopy.  We had previously attempted to arrange EGD in January 2023, but diabetes was uncontrolled.  Diabetes is now much improved, now on metformin, glipizide, and [place].  Cirrhosis: Nodular liver first noted on CT in December 2021.  Last ultrasound in November 2022 with nodular liver and splenomegaly, also with chronic thrombocytopenia and mildly elevated liver enzymes.  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  Laboratory evaluation thus far with immunoglobulins within normal limits, ANA and AMA negative, alpha-1 antitrypsin within normal limits, celiac screen negative, AFP within normal limits.  Hepatitis serologies previously ordered were not drawn and ASMA also left off; we will re-order these.  She remains fairly well compensated, only with intermittent slight peripheral edema that resolves overnight.  MELD Na  8 in January 2023.  Needs first-ever EGD for variceal screening.  She is also due for routine ultrasound.  Dysphagia: Intermittent pill dysphagia and also noting intermittent burning at the sternal notch when taking medications at bedtime, no other heartburn symptoms.  Query pill induced esophagitis, esophageal web, ring, or stricture.  Recommended drinking 8 ounces of water when taking evening medications and try adding  Pepcid 20 mg with evening medications.  She will also be undergoing EGD for further evaluation.  History of adenomatous colon polyps: Due for surveillance colonoscopy.  Last colonoscopy July 2020 with 11 tubular adenomas removed.  Recommended 3-year surveillance.  Currently without any significant lower GI symptoms or alarm symptoms.  No family history of colon cancer.  IDA: Chronic.  No overt GI bleeding.  Denies NSAIDs.  Chronically on Eliquis.  Following with hematology and maintained on oral iron every other day along with as needed iron infusions, last infusion on 02/07/2022, prior infusion was in November 2022.  Most recent hemoglobin 13.3 on 5/8, iron panel at that time with iron 215, saturation 53, ferritin 22. Previously underwent EGD and colonoscopy in July 2020 revealing H. pylori gastritis (s/p treatment and cure) and multiple tubular adenomas, due for surveillance colonoscopy at this time.  Celiac screen negative this year.  We are planning to proceed with surveillance colonoscopy as well as EGD for dysphagia and variceal screening due to history of cirrhosis  As she has continued to need periodic iron infusions, may need to consider capsule endoscopy to complete GI evaluation pending EGD and colonoscopy.    Plan:  Proceed with upper endoscopy with possible dilation and colonoscopy with propofol by Dr. [name] in near future. The risks, benefits, and alternatives have been discussed with the patient in detail. The patient states understanding and desires to proceed. ASA 3 Hold Eliquis x48 hours prior to procedure. Hold iron x10 days prior to procedure. Separate instructions provided for diabetes medication adjustments. RUQ ultrasound. Hepatitis B surface antibody, hepatitis B surface antigen, hepatitis C antibody, hepatitis A antibody total, ASMA. Drink 8 ounces of water when taking evening medications. Take Pepcid 20 mg in the evening with medications. Nutrition  recommendations: High-protein diet from a primarily plant-based diet. Avoid red meat.  No raw or undercooked meat, seafood, or shellfish. Low-fat/cholesterol/carbohydrate diet. Limit sodium to no more than 2000 mg/day including everything that you eat and drink. At least 30 minutes of aerobic and resistance exercise 3 days/week. Follow-up after procedures.   Aliene Altes, PA-C [facility] 02/09/2022

## 2022-02-07 NOTE — Progress Notes (Signed)
Patient presents today for Feraheme infusion. Vital signs stable. No complaints at this time.  ? ?Treatment given today per MD orders. Tolerated infusion without adverse affects. Vital signs stable. No complaints at this time. Discharged from clinic ambulatory in stable condition. Alert and oriented x 3. F/U with [facility] as scheduled.   ? ? ?

## 2022-02-07 NOTE — Patient Instructions (Signed)
[facility]  Discharge Instructions: Thank you for choosing [facility] to provide your oncology and hematology care.  If you have a lab appointment with the Cancer Center, please come in thru the Main Entrance and check in at the main information desk.  Wear comfortable clothing and clothing appropriate for easy access to any Portacath or PICC line.   We strive to give you quality time with your provider. You may need to reschedule your appointment if you arrive late (15 or more minutes).  Arriving late affects you and other patients whose appointments are after yours.  Also, if you miss three or more appointments without notifying the office, you may be dismissed from the clinic at the provider's discretion.      For prescription refill requests, have your pharmacy contact our office and allow 72 hours for refills to be completed.    Today you received the following chemotherapy and/or immunotherapy agents Feraheme infusion      To help prevent nausea and vomiting after your treatment, we encourage you to take your nausea medication as directed.  BELOW ARE SYMPTOMS THAT SHOULD BE REPORTED IMMEDIATELY: *FEVER GREATER THAN 100.4 F (38 C) OR HIGHER *CHILLS OR SWEATING *NAUSEA AND VOMITING THAT IS NOT CONTROLLED WITH YOUR NAUSEA MEDICATION *UNUSUAL SHORTNESS OF BREATH *UNUSUAL BRUISING OR BLEEDING *URINARY PROBLEMS (pain or burning when urinating, or frequent urination) *BOWEL PROBLEMS (unusual diarrhea, constipation, pain near the anus) TENDERNESS IN MOUTH AND THROAT WITH OR WITHOUT PRESENCE OF ULCERS (sore throat, sores in mouth, or a toothache) UNUSUAL RASH, SWELLING OR PAIN  UNUSUAL VAGINAL DISCHARGE OR ITCHING   Items with * indicate a potential emergency and should be followed up as soon as possible or go to the Emergency Department if any problems should occur.  Please show the CHEMOTHERAPY ALERT CARD or IMMUNOTHERAPY ALERT CARD at check-in to the  Emergency Department and triage nurse.  Should you have questions after your visit or need to cancel or reschedule your appointment, please contact [facility] [phone]  and follow the prompts.  Office hours are 8:00 a.m. to 4:30 p.m. Monday - Friday. Please note that voicemails left after 4:00 p.m. may not be returned until the following business day.  We are closed weekends and major holidays. You have access to a nurse at all times for urgent questions. Please call the main number to the clinic [phone] and follow the prompts.  For any non-urgent questions, you may also contact your provider using MyChart. We now offer e-Visits for anyone 18 and older to request care online for non-urgent symptoms. For details visit [email or web address].   Also download the MyChart app! Go to the app store, search "MyChart", open the app, select [facility], and log in with your MyChart username and password.  Due to Covid, a mask is required upon entering the hospital/clinic. If you do not have a mask, one will be given to you upon arrival. For doctor visits, patients may have 1 support person aged 18 or older with them. For treatment visits, patients cannot have anyone with them due to current Covid guidelines and our immunocompromised population.  

## 2022-02-09 ENCOUNTER — Ambulatory Visit (INDEPENDENT_AMBULATORY_CARE_PROVIDER_SITE_OTHER): Payer: Medicare Other | Admitting: Gastroenterology

## 2022-02-09 ENCOUNTER — Encounter: Payer: Self-pay | Admitting: Gastroenterology

## 2022-02-09 ENCOUNTER — Other Ambulatory Visit: Payer: Self-pay

## 2022-02-09 VITALS — BP 110/62 | HR 76 | Temp 97.3°F | Ht 62.0 in | Wt 214.8 lb

## 2022-02-09 DIAGNOSIS — R1310 Dysphagia, unspecified: Secondary | ICD-10-CM | POA: Diagnosis not present

## 2022-02-09 DIAGNOSIS — K7460 Unspecified cirrhosis of liver: Secondary | ICD-10-CM | POA: Diagnosis not present

## 2022-02-09 DIAGNOSIS — D509 Iron deficiency anemia, unspecified: Secondary | ICD-10-CM

## 2022-02-09 DIAGNOSIS — Z86010 Personal history of colonic polyps: Secondary | ICD-10-CM

## 2022-02-09 MED ORDER — PEG 3350-KCL-NA BICARB-NACL 420 G PO SOLR: 4000.0000 mL | ORAL | 0 refills | Status: DC

## 2022-02-09 NOTE — Patient Instructions (Addendum)
We will arrange for you to have an ultrasound of your liver at [facility].  Please have blood work completed at [facility] when you have your ultrasound completed.  We will arrange for you to have a colonoscopy and upper endoscopy with possible stretching of your esophagus in the near future with Dr. [name]. Hold Eliquis for 48 hours prior to your procedures. Hold iron for 10 days prior to your procedures. 1 day prior to procedure: Take your diabetes medications as prescribed. Day of procedure: Do not take any morning diabetes medications.  While you are on a clear liquid diet, keep a close check on your blood sugars and correct any low blood sugars with approved sugary clear liquids.  When taking your medications in the evening, drink a full 8 ounces of water.  You may also try taking Pepcid 20 mg in the evening with your medications to prevent the burning in your esophagus.  Nutrition:  High-protein diet from a primarily plant-based diet. Avoid red meat.  No raw or undercooked meat, seafood, or shellfish. Low-fat/cholesterol/carbohydrate diet. Limit sodium to no more than 2000 mg/day including everything that you eat and drink. Recommend at least 30 minutes of aerobic and resistance exercise 3 days/week.  We will plan to see you back in the office after your procedures.  Do not hesitate to call if you have any questions or concerns prior to your next visit.  It was good to see you again today!   Aliene Altes, PA-C [facility]

## 2022-02-13 DIAGNOSIS — H35372 Puckering of macula, left eye: Secondary | ICD-10-CM | POA: Diagnosis not present

## 2022-02-13 DIAGNOSIS — H3581 Retinal edema: Secondary | ICD-10-CM | POA: Diagnosis not present

## 2022-02-14 ENCOUNTER — Inpatient Hospital Stay (HOSPITAL_COMMUNITY): Payer: Medicare Other

## 2022-02-14 VITALS — BP 119/70 | HR 66 | Temp 97.8°F | Resp 18

## 2022-02-14 DIAGNOSIS — D509 Iron deficiency anemia, unspecified: Secondary | ICD-10-CM

## 2022-02-14 DIAGNOSIS — F32A Depression, unspecified: Secondary | ICD-10-CM | POA: Diagnosis not present

## 2022-02-14 DIAGNOSIS — I4891 Unspecified atrial fibrillation: Secondary | ICD-10-CM | POA: Diagnosis not present

## 2022-02-14 DIAGNOSIS — D696 Thrombocytopenia, unspecified: Secondary | ICD-10-CM | POA: Diagnosis not present

## 2022-02-14 DIAGNOSIS — R911 Solitary pulmonary nodule: Secondary | ICD-10-CM | POA: Diagnosis not present

## 2022-02-14 DIAGNOSIS — R5383 Other fatigue: Secondary | ICD-10-CM | POA: Diagnosis not present

## 2022-02-14 MED ORDER — LORATADINE 10 MG PO TABS: 10.0000 mg | ORAL_TABLET | Freq: Once | ORAL | Status: DC

## 2022-02-14 MED ORDER — ACETAMINOPHEN 325 MG PO TABS: 650.0000 mg | ORAL_TABLET | Freq: Once | ORAL | Status: DC

## 2022-02-14 MED ORDER — SODIUM CHLORIDE 0.9 % IV SOLN: Freq: Once | INTRAVENOUS | Status: AC

## 2022-02-14 MED ORDER — SODIUM CHLORIDE 0.9 % IV SOLN
510.0000 mg | Freq: Once | INTRAVENOUS | Status: AC
  Administered 2022-02-14: 510 mg via INTRAVENOUS
  Filled 2022-02-14: qty 17

## 2022-02-14 NOTE — Progress Notes (Signed)
Patient presents today for Feraheme infusion per providers order.  Vital signs WNL.  Patient took premedications prior to appointment.  Peripheral IV started and blood return noted pre and post infusion.  Feraheme given today per MD orders.  Stable during infusion without adverse affects.  Vital signs stable.  No complaints at this time.  Discharge from clinic ambulatory in stable condition.  Alert and oriented X 3.  Follow up with [facility] as scheduled.

## 2022-02-14 NOTE — Patient Instructions (Signed)
[facility]  Discharge Instructions: °Thank you for choosing [facility] to provide your oncology and hematology care.  °If you have a lab appointment with the Cancer Center, please come in thru the Main Entrance and check in at the main information desk. ° °Wear comfortable clothing and clothing appropriate for easy access to any Portacath or PICC line.  ° °We strive to give you quality time with your provider. You may need to reschedule your appointment if you arrive late (15 or more minutes).  Arriving late affects you and other patients whose appointments are after yours.  Also, if you miss three or more appointments without notifying the office, you may be dismissed from the clinic at the provider’s discretion.    °  °For prescription refill requests, have your pharmacy contact our office and allow 72 hours for refills to be completed.   ° °Today you received the following chemotherapy and/or immunotherapy agents Feraheme    °  °To help prevent nausea and vomiting after your treatment, we encourage you to take your nausea medication as directed. ° °BELOW ARE SYMPTOMS THAT SHOULD BE REPORTED IMMEDIATELY: °*FEVER GREATER THAN 100.4 F (38 °C) OR HIGHER °*CHILLS OR SWEATING °*NAUSEA AND VOMITING THAT IS NOT CONTROLLED WITH YOUR NAUSEA MEDICATION °*UNUSUAL SHORTNESS OF BREATH °*UNUSUAL BRUISING OR BLEEDING °*URINARY PROBLEMS (pain or burning when urinating, or frequent urination) °*BOWEL PROBLEMS (unusual diarrhea, constipation, pain near the anus) °TENDERNESS IN MOUTH AND THROAT WITH OR WITHOUT PRESENCE OF ULCERS (sore throat, sores in mouth, or a toothache) °UNUSUAL RASH, SWELLING OR PAIN  °UNUSUAL VAGINAL DISCHARGE OR ITCHING  ° °Items with * indicate a potential emergency and should be followed up as soon as possible or go to the Emergency Department if any problems should occur. ° °Please show the CHEMOTHERAPY ALERT CARD or IMMUNOTHERAPY ALERT CARD at check-in to the Emergency  Department and triage nurse. ° °Should you have questions after your visit or need to cancel or reschedule your appointment, please contact [facility] [phone]  and follow the prompts.  Office hours are 8:00 a.m. to 4:30 p.m. Monday - Friday. Please note that voicemails left after 4:00 p.m. may not be returned until the following business day.  We are closed weekends and major holidays. You have access to a nurse at all times for urgent questions. Please call the main number to the clinic [phone] and follow the prompts. ° °For any non-urgent questions, you may also contact your provider using MyChart. We now offer e-Visits for anyone 18 and older to request care online for non-urgent symptoms. For details visit [email or web address]. °  °Also download the MyChart app! Go to the app store, search "MyChart", open the app, select , and log in with your MyChart username and password. ° °Due to Covid, a mask is required upon entering the hospital/clinic. If you do not have a mask, one will be given to you upon arrival. For doctor visits, patients may have 1 support person aged 18 or older with them. For treatment visits, patients cannot have anyone with them due to current Covid guidelines and our immunocompromised population.  °

## 2022-02-21 DIAGNOSIS — E1165 Type 2 diabetes mellitus with hyperglycemia: Secondary | ICD-10-CM | POA: Diagnosis not present

## 2022-02-23 ENCOUNTER — Ambulatory Visit (HOSPITAL_COMMUNITY)
Admission: RE | Admit: 2022-02-23 | Discharge: 2022-02-23 | Disposition: A | Discharge status: Home / Self Care | Payer: Medicare Other | Source: Ambulatory Visit | Attending: Gastroenterology | Admitting: Gastroenterology

## 2022-02-23 DIAGNOSIS — K7460 Unspecified cirrhosis of liver: Secondary | ICD-10-CM | POA: Diagnosis not present

## 2022-02-23 DIAGNOSIS — Z9049 Acquired absence of other specified parts of digestive tract: Secondary | ICD-10-CM | POA: Diagnosis not present

## 2022-02-28 ENCOUNTER — Other Ambulatory Visit (HOSPITAL_COMMUNITY)
Admission: RE | Admit: 2022-02-28 | Discharge: 2022-02-28 | Disposition: A | Discharge status: Home / Self Care | Payer: Medicare Other | Source: Ambulatory Visit | Attending: Gastroenterology | Admitting: Gastroenterology

## 2022-02-28 DIAGNOSIS — K7460 Unspecified cirrhosis of liver: Secondary | ICD-10-CM | POA: Diagnosis not present

## 2022-02-28 LAB — HEPATITIS C ANTIBODY: HCV Ab: NONREACTIVE

## 2022-02-28 LAB — HEPATITIS B SURFACE ANTIGEN: Hepatitis B Surface Ag: NONREACTIVE

## 2022-02-28 LAB — HEPATITIS B SURFACE ANTIBODY,QUALITATIVE: Hep B S Ab: NONREACTIVE

## 2022-02-28 LAB — HEPATITIS A ANTIBODY, TOTAL: hep A Total Ab: NONREACTIVE

## 2022-03-03 LAB — ANTI-SMOOTH MUSCLE ANTIBODY, IGG: F-Actin IgG: 11 Units (ref 0–19)

## 2022-03-14 NOTE — Patient Instructions (Signed)
Linda [name]  03/14/2022     '@[email or web address]'$ @   Your procedure is scheduled on  03/20/2022.   Report to [facility] at  [place].M.   Call this number if you have problems the morning of surgery:  [phone]   Remember:  Follow the diet and prep instructions given to you by the office.     Your last dose of iron should have been 6/15.    Your last dose of eliquis should be on 6/23.    DO NOT take any medications for diabetes the morning of your procedure.    Use your inhalers before you come and bring your rescue inhaler with you.     Take these medicines the morning of surgery with A SIP OF WATER                     zyrtec, cymbalta, metoprolol.     Do not wear jewelry, make-up or nail polish.  Do not wear lotions, powders, or perfumes, or deodorant.  Do not shave 48 hours prior to surgery.  Men may shave face and neck.  Do not bring valuables to the hospital.  [facility] is not responsible for any belongings or valuables.  Contacts, dentures or bridgework may not be worn into surgery.  Leave your suitcase in the car.  After surgery it may be brought to your room.  For patients admitted to the hospital, discharge time will be determined by your treatment team.  Patients discharged the day of surgery will not be allowed to drive home and must have someone with them for 24 hours.    Special instructions:   DO NOT smoke tobacco or vape for 24 hours before your procedure.  Please read over the following fact sheets that you were given. Anesthesia Post-op Instructions and Care and Recovery After Surgery      Upper Endoscopy, Adult, Care After This sheet gives you information about how to care for yourself after your procedure. Your health care provider may also give you more specific instructions. If you have problems or questions, contact your health care provider. What can I expect after the procedure? After the procedure, it is common  to have: A sore throat. Mild stomach pain or discomfort. Bloating. Nausea. Follow these instructions at home:  Follow instructions from your health care provider about what to eat or drink after your procedure. Return to your normal activities as told by your health care provider. Ask your health care provider what activities are safe for you. Take over-the-counter and prescription medicines only as told by your health care provider. If you were given a sedative during the procedure, it can affect you for several hours. Do not drive or operate machinery until your health care provider says that it is safe. Keep all follow-up visits as told by your health care provider. This is important. Contact a health care provider if you have: A sore throat that lasts longer than one day. Trouble swallowing. Get help right away if: You vomit blood or your vomit looks like coffee grounds. You have: A fever. Bloody, black, or tarry stools. A severe sore throat or you cannot swallow. Difficulty breathing. Severe pain in your chest or abdomen. Summary After the procedure, it is common to have a sore throat, mild stomach discomfort, bloating, and nausea. If you were given a sedative during the procedure, it can affect you for several  hours. Do not drive or operate machinery until your health care provider says that it is safe. Follow instructions from your health care provider about what to eat or drink after your procedure. Return to your normal activities as told by your health care provider. This information is not intended to replace advice given to you by your health care provider. Make sure you discuss any questions you have with your health care provider. Document Revised: 07/18/2019 Document Reviewed: 02/11/2018 Elsevier Patient Education  [name]. Esophageal Dilatation Esophageal dilatation, also called esophageal dilation, is a procedure to widen or open a blocked or narrowed part  of the esophagus. The esophagus is the part of the body that moves food and liquid from the mouth to the stomach. You may need this procedure if: You have a buildup of scar tissue in your esophagus that makes it difficult, painful, or impossible to swallow. This can be caused by gastroesophageal reflux disease (GERD). You have cancer of the esophagus. There is a problem with how food moves through your esophagus. In some cases, you may need this procedure repeated at a later time to dilate the esophagus gradually. Tell a health care provider about: Any allergies you have. All medicines you are taking, including vitamins, herbs, eye drops, creams, and over-the-counter medicines. Any problems you or family members have had with anesthetic medicines. Any blood disorders you have. Any surgeries you have had. Any medical conditions you have. Any antibiotic medicines you are required to take before dental procedures. Whether you are pregnant or may be pregnant. What are the risks? Generally, this is a safe procedure. However, problems may occur, including: Bleeding due to a tear in the lining of the esophagus. A hole, or perforation, in the esophagus. What happens before the procedure? Ask your health care provider about: Changing or stopping your regular medicines. This is especially important if you are taking diabetes medicines or blood thinners. Taking medicines such as aspirin and ibuprofen. These medicines can thin your blood. Do not take these medicines unless your health care provider tells you to take them. Taking over-the-counter medicines, vitamins, herbs, and supplements. Follow instructions from your health care provider about eating or drinking restrictions. Plan to have a responsible adult take you home from the hospital or clinic. Plan to have a responsible adult care for you for the time you are told after you leave the hospital or clinic. This is important. What happens during  the procedure? You may be given a medicine to help you relax (sedative). A numbing medicine may be sprayed into the back of your throat, or you may gargle the medicine. Your health care provider may perform the dilatation using various surgical instruments, such as: Simple dilators. This instrument is carefully placed in the esophagus to stretch it. Guided wire bougies. This involves using an endoscope to insert a wire into the esophagus. A dilator is passed over this wire to enlarge the esophagus. Then the wire is removed. Balloon dilators. An endoscope with a small balloon is inserted into the esophagus. The balloon is inflated to stretch the esophagus and open it up. The procedure may vary among health care providers and hospitals. What can I expect after the procedure? Your blood pressure, heart rate, breathing rate, and blood oxygen level will be monitored until you leave the hospital or clinic. Your throat may feel slightly sore and numb. This will get better over time. You will not be allowed to eat or drink until your throat is  no longer numb. When you are able to drink, urinate, and sit on the edge of the bed without nausea or dizziness, you may be able to return home. Follow these instructions at home: Take over-the-counter and prescription medicines only as told by your health care provider. If you were given a sedative during the procedure, it can affect you for several hours. Do not drive or operate machinery until your health care provider says that it is safe. Plan to have a responsible adult care for you for the time you are told. This is important. Follow instructions from your health care provider about any eating or drinking restrictions. Do not use any products that contain nicotine or tobacco, such as cigarettes, e-cigarettes, and chewing tobacco. If you need help quitting, ask your health care provider. Keep all follow-up visits. This is important. Contact a health care  provider if: You have a fever. You have pain that is not relieved by medicine. Get help right away if: You have chest pain. You have trouble breathing. You have trouble swallowing. You vomit blood. You have black, tarry, or bloody stools. These symptoms may represent a serious problem that is an emergency. Do not wait to see if the symptoms will go away. Get medical help right away. Call your local emergency services (911 in the [place] Do not drive yourself to the hospital. Summary Esophageal dilatation, also called esophageal dilation, is a procedure to widen or open a blocked or narrowed part of the esophagus. Plan to have a responsible adult take you home from the hospital or clinic. For this procedure, a numbing medicine may be sprayed into the back of your throat, or you may gargle the medicine. Do not drive or operate machinery until your health care provider says that it is safe. This information is not intended to replace advice given to you by your health care provider. Make sure you discuss any questions you have with your health care provider. Document Revised: 01/28/2020 Document Reviewed: 01/28/2020 Elsevier Patient Education  [place]. Colonoscopy, Adult, Care After The following information offers guidance on how to care for yourself after your procedure. Your health care provider may also give you more specific instructions. If you have problems or questions, contact your health care provider. What can I expect after the procedure? After the procedure, it is common to have: A small amount of blood in your stool for 24 hours after the procedure. Some gas. Mild cramping or bloating of your abdomen. Follow these instructions at home: Eating and drinking  Drink enough fluid to keep your urine pale yellow. Follow instructions from your health care provider about eating or drinking restrictions. Resume your normal diet as told by your health care provider. Avoid  heavy or fried foods that are hard to digest. Activity Rest as told by your health care provider. Avoid sitting for a long time without moving. Get up to take short walks every 1-2 hours. This is important to improve blood flow and breathing. Ask for help if you feel weak or unsteady. Return to your normal activities as told by your health care provider. Ask your health care provider what activities are safe for you. Managing cramping and bloating  Try walking around when you have cramps or feel bloated. If directed, apply heat to your abdomen as told by your health care provider. Use the heat source that your health care provider recommends, such as a moist heat pack or a heating pad. Place a towel between your  skin and the heat source. Leave the heat on for 20-30 minutes. Remove the heat if your skin turns bright red. This is especially important if you are unable to feel pain, heat, or cold. You have a greater risk of getting burned. General instructions If you were given a sedative during the procedure, it can affect you for several hours. Do not drive or operate machinery until your health care provider says that it is safe. For the first 24 hours after the procedure: Do not sign important documents. Do not drink alcohol. Do your regular daily activities at a slower pace than normal. Eat soft foods that are easy to digest. Take over-the-counter and prescription medicines only as told by your health care provider. Keep all follow-up visits. This is important. Contact a health care provider if: You have blood in your stool 2-3 days after the procedure. Get help right away if: You have more than a small spotting of blood in your stool. You have large blood clots in your stool. You have swelling of your abdomen. You have nausea or vomiting. You have a fever. You have increasing pain in your abdomen that is not relieved with medicine. These symptoms may be an emergency. Get help right  away. Call 911. Do not wait to see if the symptoms will go away. Do not drive yourself to the hospital. Summary After the procedure, it is common to have a small amount of blood in your stool. You may also have mild cramping and bloating of your abdomen. If you were given a sedative during the procedure, it can affect you for several hours. Do not drive or operate machinery until your health care provider says that it is safe. Get help right away if you have a lot of blood in your stool, nausea or vomiting, a fever, or increased pain in your abdomen. This information is not intended to replace advice given to you by your health care provider. Make sure you discuss any questions you have with your health care provider. Document Revised: 05/04/2021 Document Reviewed: 05/04/2021 Elsevier Patient Education  [place] After This sheet gives you information about how to care for yourself after your procedure. Your health care provider may also give you more specific instructions. If you have problems or questions, contact your health care provider. What can I expect after the procedure? After the procedure, it is common to have: Tiredness. Forgetfulness about what happened after the procedure. Impaired judgment for important decisions. Nausea or vomiting. Some difficulty with balance. Follow these instructions at home: For the time period you were told by your health care provider:     Rest as needed. Do not participate in activities where you could fall or become injured. Do not drive or use machinery. Do not drink alcohol. Do not take sleeping pills or medicines that cause drowsiness. Do not make important decisions or sign legal documents. Do not take care of children on your own. Eating and drinking Follow the diet that is recommended by your health care provider. Drink enough fluid to keep your urine pale yellow. If you vomit: Drink water,  juice, or soup when you can drink without vomiting. Make sure you have little or no nausea before eating solid foods. General instructions Have a responsible adult stay with you for the time you are told. It is important to have someone help care for you until you are awake and alert. Take over-the-counter and prescription medicines only as told  by your health care provider. If you have sleep apnea, surgery and certain medicines can increase your risk for breathing problems. Follow instructions from your health care provider about wearing your sleep device: Anytime you are sleeping, including during daytime naps. While taking prescription pain medicines, sleeping medicines, or medicines that make you drowsy. Avoid smoking. Keep all follow-up visits as told by your health care provider. This is important. Contact a health care provider if: You keep feeling nauseous or you keep vomiting. You feel light-headed. You are still sleepy or having trouble with balance after 24 hours. You develop a rash. You have a fever. You have redness or swelling around the IV site. Get help right away if: You have trouble breathing. You have new-onset confusion at home. Summary For several hours after your procedure, you may feel tired. You may also be forgetful and have poor judgment. Have a responsible adult stay with you for the time you are told. It is important to have someone help care for you until you are awake and alert. Rest as told. Do not drive or operate machinery. Do not drink alcohol or take sleeping pills. Get help right away if you have trouble breathing, or if you suddenly become confused. This information is not intended to replace advice given to you by your health care provider. Make sure you discuss any questions you have with your health care provider. Document Revised: 08/16/2021 Document Reviewed: 08/14/2019 Elsevier Patient Education  [place].

## 2022-03-16 ENCOUNTER — Encounter (HOSPITAL_COMMUNITY)
Admission: RE | Admit: 2022-03-16 | Discharge: 2022-03-16 | Disposition: A | Discharge status: Home / Self Care | Payer: Medicare Other | Source: Ambulatory Visit | Attending: Internal Medicine | Admitting: Internal Medicine

## 2022-03-16 ENCOUNTER — Encounter (HOSPITAL_COMMUNITY): Payer: Self-pay

## 2022-03-16 DIAGNOSIS — K7460 Unspecified cirrhosis of liver: Secondary | ICD-10-CM | POA: Diagnosis not present

## 2022-03-16 DIAGNOSIS — Z01818 Encounter for other preprocedural examination: Secondary | ICD-10-CM | POA: Insufficient documentation

## 2022-03-16 DIAGNOSIS — I10 Essential (primary) hypertension: Secondary | ICD-10-CM | POA: Diagnosis not present

## 2022-03-16 LAB — COMPREHENSIVE METABOLIC PANEL
ALT: 32 U/L (ref 0–44)
AST: 31 U/L (ref 15–41)
Albumin: 3.4 g/dL — ABNORMAL LOW (ref 3.5–5.0)
Alkaline Phosphatase: 90 U/L (ref 38–126)
Anion gap: 4 — ABNORMAL LOW (ref 5–15)
BUN: 20 mg/dL (ref 8–23)
CO2: 33 mmol/L — ABNORMAL HIGH (ref 22–32)
Calcium: 9.6 mg/dL (ref 8.9–10.3)
Chloride: 104 mmol/L (ref 98–111)
Creatinine, Ser: 0.83 mg/dL (ref 0.44–1.00)
GFR, Estimated: >60 mL/min (ref >60)
Glucose, Bld: 141 mg/dL — ABNORMAL HIGH (ref 70–99)
Potassium: 3.8 mmol/L (ref 3.5–5.1)
Sodium: 141 mmol/L (ref 135–145)
Total Bilirubin: 1.5 mg/dL — ABNORMAL HIGH (ref 0.3–1.2)
Total Protein: 6.4 g/dL — ABNORMAL LOW (ref 6.5–8.1)

## 2022-03-16 LAB — PROTIME-INR
INR: 1.3 — ABNORMAL HIGH (ref 0.8–1.2)
Prothrombin Time: 15.8 seconds — ABNORMAL HIGH (ref 11.4–15.2)

## 2022-03-20 ENCOUNTER — Encounter (HOSPITAL_COMMUNITY)
Admission: RE | Disposition: A | Discharge status: Home / Self Care | Payer: Self-pay | Source: Home / Self Care | Attending: Internal Medicine

## 2022-03-20 ENCOUNTER — Other Ambulatory Visit: Payer: Self-pay | Admitting: Internal Medicine

## 2022-03-20 ENCOUNTER — Ambulatory Visit (HOSPITAL_COMMUNITY)
Admission: RE | Admit: 2022-03-20 | Discharge: 2022-03-20 | Disposition: A | Discharge status: Home / Self Care | Payer: Medicare Other | Attending: Internal Medicine | Admitting: Internal Medicine

## 2022-03-20 ENCOUNTER — Ambulatory Visit (HOSPITAL_BASED_OUTPATIENT_CLINIC_OR_DEPARTMENT_OTHER): Payer: Medicare Other | Admitting: Anesthesiology

## 2022-03-20 ENCOUNTER — Ambulatory Visit (HOSPITAL_COMMUNITY): Payer: Medicare Other | Admitting: Anesthesiology

## 2022-03-20 ENCOUNTER — Encounter (HOSPITAL_COMMUNITY): Payer: Self-pay

## 2022-03-20 DIAGNOSIS — Z86010 Personal history of colonic polyps: Secondary | ICD-10-CM | POA: Diagnosis not present

## 2022-03-20 DIAGNOSIS — I2510 Atherosclerotic heart disease of native coronary artery without angina pectoris: Secondary | ICD-10-CM | POA: Diagnosis not present

## 2022-03-20 DIAGNOSIS — K2970 Gastritis, unspecified, without bleeding: Secondary | ICD-10-CM | POA: Diagnosis not present

## 2022-03-20 DIAGNOSIS — E119 Type 2 diabetes mellitus without complications: Secondary | ICD-10-CM | POA: Insufficient documentation

## 2022-03-20 DIAGNOSIS — D509 Iron deficiency anemia, unspecified: Secondary | ICD-10-CM | POA: Diagnosis not present

## 2022-03-20 DIAGNOSIS — D127 Benign neoplasm of rectosigmoid junction: Secondary | ICD-10-CM | POA: Diagnosis not present

## 2022-03-20 DIAGNOSIS — K635 Polyp of colon: Secondary | ICD-10-CM | POA: Diagnosis not present

## 2022-03-20 DIAGNOSIS — E1169 Type 2 diabetes mellitus with other specified complication: Secondary | ICD-10-CM

## 2022-03-20 DIAGNOSIS — J449 Chronic obstructive pulmonary disease, unspecified: Secondary | ICD-10-CM | POA: Insufficient documentation

## 2022-03-20 DIAGNOSIS — K3189 Other diseases of stomach and duodenum: Secondary | ICD-10-CM | POA: Diagnosis not present

## 2022-03-20 DIAGNOSIS — R1310 Dysphagia, unspecified: Secondary | ICD-10-CM | POA: Insufficient documentation

## 2022-03-20 DIAGNOSIS — K7460 Unspecified cirrhosis of liver: Secondary | ICD-10-CM

## 2022-03-20 DIAGNOSIS — K5730 Diverticulosis of large intestine without perforation or abscess without bleeding: Secondary | ICD-10-CM | POA: Insufficient documentation

## 2022-03-20 DIAGNOSIS — K648 Other hemorrhoids: Secondary | ICD-10-CM | POA: Insufficient documentation

## 2022-03-20 DIAGNOSIS — Z87891 Personal history of nicotine dependence: Secondary | ICD-10-CM | POA: Insufficient documentation

## 2022-03-20 DIAGNOSIS — K219 Gastro-esophageal reflux disease without esophagitis: Secondary | ICD-10-CM | POA: Insufficient documentation

## 2022-03-20 DIAGNOSIS — D123 Benign neoplasm of transverse colon: Secondary | ICD-10-CM | POA: Insufficient documentation

## 2022-03-20 DIAGNOSIS — I10 Essential (primary) hypertension: Secondary | ICD-10-CM

## 2022-03-20 HISTORY — PX: COLONOSCOPY WITH PROPOFOL: SHX5780

## 2022-03-20 HISTORY — PX: ESOPHAGOGASTRODUODENOSCOPY (EGD) WITH PROPOFOL: SHX5813

## 2022-03-20 HISTORY — PX: BIOPSY: SHX5522

## 2022-03-20 HISTORY — PX: POLYPECTOMY: SHX5525

## 2022-03-20 LAB — GLUCOSE, CAPILLARY: Glucose-Capillary: 106 mg/dL — ABNORMAL HIGH (ref 70–99)

## 2022-03-20 SURGERY — COLONOSCOPY WITH PROPOFOL
Anesthesia: General

## 2022-03-20 MED ORDER — LIDOCAINE 2% (20 MG/ML) 5 ML SYRINGE
INTRAMUSCULAR | Status: DC | PRN
  Administered 2022-03-20: 50 mg via INTRAVENOUS

## 2022-03-20 MED ORDER — LACTATED RINGERS IV SOLN: INTRAVENOUS | Status: DC

## 2022-03-20 MED ORDER — CALCIUM CARBONATE ANTACID 750 MG PO CHEW: 1.0000 | CHEWABLE_TABLET | Freq: Every evening | ORAL | 11 refills | Status: AC | PRN

## 2022-03-20 MED ORDER — PROPOFOL 500 MG/50ML IV EMUL
INTRAVENOUS | Status: DC | PRN
  Administered 2022-03-20: 200 ug/kg/min via INTRAVENOUS

## 2022-03-20 MED ORDER — PROPOFOL 10 MG/ML IV BOLUS
INTRAVENOUS | Status: DC | PRN
  Administered 2022-03-20: 90 mg via INTRAVENOUS

## 2022-03-20 NOTE — Anesthesia Postprocedure Evaluation (Signed)
Anesthesia Post Note  Patient: ANYCE WINSTON  Procedure(s) Performed: COLONOSCOPY WITH PROPOFOL ESOPHAGOGASTRODUODENOSCOPY (EGD) WITH PROPOFOL BIOPSY POLYPECTOMY  Patient location during evaluation: Phase II Anesthesia Type: General Level of consciousness: awake Pain management: pain level controlled Vital Signs Assessment: post-procedure vital signs reviewed and stable Respiratory status: spontaneous breathing and respiratory function stable Cardiovascular status: blood pressure returned to baseline and stable Postop Assessment: no headache and no apparent nausea or vomiting Anesthetic complications: no Comments: Late entry   No notable events documented.   Last Vitals:  Vitals:   03/20/22 0759 03/20/22 0940  BP: 134/68 (!) 112/55  Pulse: 73 79  Resp: 12 15  Temp: 36.8 C 36.4 C  SpO2: 94% 96%    Last Pain:  Vitals:   03/20/22 0940  TempSrc: Oral  PainSc: 0-No pain                 Windell [place]

## 2022-03-21 ENCOUNTER — Other Ambulatory Visit (HOSPITAL_COMMUNITY): Payer: Self-pay | Admitting: Internal Medicine

## 2022-03-21 DIAGNOSIS — Z1231 Encounter for screening mammogram for malignant neoplasm of breast: Secondary | ICD-10-CM

## 2022-03-21 LAB — SURGICAL PATHOLOGY

## 2022-03-23 DIAGNOSIS — E1165 Type 2 diabetes mellitus with hyperglycemia: Secondary | ICD-10-CM | POA: Diagnosis not present

## 2022-03-24 ENCOUNTER — Encounter (HOSPITAL_COMMUNITY): Payer: Self-pay | Admitting: Internal Medicine

## 2022-04-04 DIAGNOSIS — E1165 Type 2 diabetes mellitus with hyperglycemia: Secondary | ICD-10-CM | POA: Diagnosis not present

## 2022-04-04 DIAGNOSIS — Z6838 Body mass index (BMI) 38.0-38.9, adult: Secondary | ICD-10-CM | POA: Diagnosis not present

## 2022-04-04 DIAGNOSIS — I10 Essential (primary) hypertension: Secondary | ICD-10-CM | POA: Diagnosis not present

## 2022-04-04 DIAGNOSIS — Z713 Dietary counseling and surveillance: Secondary | ICD-10-CM | POA: Diagnosis not present

## 2022-04-04 DIAGNOSIS — Z299 Encounter for prophylactic measures, unspecified: Secondary | ICD-10-CM | POA: Diagnosis not present

## 2022-04-21 ENCOUNTER — Ambulatory Visit (HOSPITAL_COMMUNITY)
Admission: RE | Admit: 2022-04-21 | Discharge: 2022-04-21 | Disposition: A | Discharge status: Home / Self Care | Payer: Medicare Other | Source: Ambulatory Visit | Attending: Internal Medicine | Admitting: Internal Medicine

## 2022-04-21 DIAGNOSIS — Z1231 Encounter for screening mammogram for malignant neoplasm of breast: Secondary | ICD-10-CM | POA: Insufficient documentation

## 2022-04-24 DIAGNOSIS — E1165 Type 2 diabetes mellitus with hyperglycemia: Secondary | ICD-10-CM | POA: Diagnosis not present

## 2022-05-24 DIAGNOSIS — E1165 Type 2 diabetes mellitus with hyperglycemia: Secondary | ICD-10-CM | POA: Diagnosis not present

## 2022-06-29 ENCOUNTER — Ambulatory Visit: Payer: Medicare Other | Admitting: Gastroenterology

## 2022-07-03 NOTE — Progress Notes (Unsigned)
GI Office Note    Referring Provider: [name], MD Primary Care Physician:  [name], MD Primary Gastroenterologist: Elon Alas. [name], DO   Date:  07/03/2022  ID:  Linda [name], DOB 05/26/1946, MRN [id number]   Chief Complaint   No chief complaint on file.    History of Present Illness  Linda [name] is a 76 y.o. female with a history of tubular adenomas, IDA following with hematology maintained on oral iron and periodic IV iron, cirrhosis, anxiety, depression, diabetes, COPD, GERD, HTN, HLD, sleep apnea, Atrial flutter/a fib s/p ablation in 2022, H. Pylori treated with Pylera and documented eradication via stool antigen testing*** presenting today for post procedure follow up.   PCP labs in January 2023. Elevated glucose. Elevated T bli at 1.7, slightly elevated ALT. INR wnl. Immunoglobulins wnl. ANA and AMA negative. Alpha-1 antitrypsin within normal limits.  Celiac screen negative.  AFP within normal limits.  Hepatitis B core antibody total nonreactive.  Last office visit 02/09/22. Blood glucose under better control. Heartburn and burning about sternal notch at bedtime. Noted occasional pill dysphagia a few times per month. Denied abdominal distention, jaundice, mental statu changes/confusion. Notes chronic intermittent BLE edema. MELD Na 8 in January. Hep B serologies and Hep A and ASMA ordered. Scheduled for EGD/ED and TCS. Advised pepcid 20 mg in the evening. RUQ ultrasound ordered.   RUQ [place] 02/23/22: CBD 42m, wnl with cholecystectomy. Cirrhosis. No hepatoma.   Labs 02/28/22: Hep B surface antigen negative, Hep B surface antibody negative, ASMA wnl. Hep C antibody negative. T bili 1.5, AST/ALT wnl. INR 1.3  Colonoscopy and EGD 03/20/22: - Colonoscopy with internal hemorrhoids, sigmoid diverticulosis, two 4-549mtransverse polyps, two 4-6 mm recto sigmoid polyps. Biopsy with tubular adenomas. Advised repeat in 5 years.  - EGD with gastritis s/p biopsy and normal  duodenum. No evidence of varices. Biopsy with hyperemia, no H. Pylori. Repeat EGD in 3 years.    Today:    Labs: MELD labs due in December 2023 [place] in December 2023 Hepatitis A/B vaccination: needed?? EGD: Due for surveillance in 2026    Current Outpatient Medications  Medication Sig Dispense Refill   acetaminophen (TYLENOL) 500 MG tablet Take 1,000 mg by mouth every 6 (six) hours as needed for moderate pain or headache.     ADVAIR DISKUS 250-50 MCG/DOSE AEPB Inhale 1 puff into the lungs 2 (two) times daily.     ALPHA LIPOIC ACID PO Take 1 capsule by mouth daily.     apixaban (ELIQUIS) 5 MG TABS tablet Take 1 tablet (5 mg total) by mouth 2 (two) times daily. 180 tablet 3   Ascorbic Acid (VITAMIN C) 1000 MG tablet Take 1,000 mg by mouth daily.     calcium carbonate (TUMS EX) 750 MG chewable tablet Chew 1-2 tablets (750-1,500 mg total) by mouth at bedtime as needed for heartburn. 30 tablet 11   Calcium Carbonate-Vit D-Min (CALCIUM 600+D3 PLUS MINERALS) 600-800 MG-UNIT TABS Take 1 tablet by mouth daily.     cetirizine (ZYRTEC) 10 MG tablet Take 10 mg by mouth daily.     cholecalciferol (VITAMIN D3) 25 MCG (1000 UNIT) tablet Take 1,000 Units by mouth daily.     CINNAMON PO Take 2,400 mg by mouth at bedtime.     DULoxetine (CYMBALTA) 30 MG capsule Take 30 mg by mouth daily.     ELDERBERRY PO Take 1 tablet by mouth daily.     ferrous sulfate 325 (65 FE) MG  EC tablet Take 325 mg by mouth every other day.     FREESTYLE LITE test strip      glipiZIDE (GLUCOTROL) 5 MG tablet Take 5 mg by mouth daily.     Melatonin 10 MG TABS Take 10 mg by mouth at bedtime.     metFORMIN (GLUCOPHAGE) 500 MG tablet Take 500 mg by mouth 2 (two) times daily.     metoprolol succinate (TOPROL-XL) 25 MG 24 hr tablet TAKE 1 TABLET DAILY (DOSE DECREASED 03/25/21) 90 tablet 3   Multiple Vitamin (MULTIVITAMIN WITH MINERALS) TABS tablet Take 1 tablet by mouth daily.     Omega-3 Fatty Acids (FISH OIL) 1000 MG CAPS Take  1,000 mg by mouth daily.     OXYGEN Inhale 2 L into the lungs at bedtime. With BPAP     polyethylene glycol-electrolytes (TRILYTE) 420 g solution Take 4,000 mLs by mouth as directed. 4000 mL 0   pravastatin (PRAVACHOL) 20 MG tablet Take 1 tablet (20 mg total) daily by mouth. 90 tablet 3   PROAIR HFA 108 (90 Base) MCG/ACT inhaler Inhale 2 puffs into the lungs every 4 (four) hours as needed for wheezing or shortness of breath.     Probiotic Product (PROBIOTIC DAILY PO) Take 1 capsule by mouth daily.      Red Yeast Rice 600 MG TABS Take 1,200 mg by mouth daily.     triamcinolone cream (KENALOG) 0.1 % Apply 1 Application topically 2 (two) times daily as needed (itching).     Zinc 30 MG CAPS Take 30 mg by mouth daily.     No current facility-administered medications for this visit.    Past Medical History:  Diagnosis Date   Allergy    Anemia 07/25/2017   Anxiety    Arthritis    Atrial fibrillation [facility])    s/p ablation in March 2022, ILP remains in place   Cataract    COPD (chronic obstructive pulmonary disease) ([place])    Depression    Diabetes mellitus without complication ([place])    Emphysema of lung ([place])    GERD (gastroesophageal reflux disease) 10/28/2020   Hyperlipidemia    Hypertension    Oxygen deficiency    Sleep apnea    Typical atrial flutter ([place])     Past Surgical History:  Procedure Laterality Date   A-FLUTTER ABLATION N/A 10/04/2017   Procedure: A-FLUTTER ABLATION;  Surgeon: Thompson Grayer, MD;  Location: [facility];  Service: Cardiovascular;  Laterality: N/A;   ABDOMINAL HYSTERECTOMY     fibroids   ATRIAL FIBRILLATION ABLATION N/A 12/09/2020   Procedure: ATRIAL FIBRILLATION ABLATION;  Surgeon: Thompson Grayer, MD;  Location: [place];  Service: Cardiovascular;  Laterality: N/A;   BIOPSY  03/20/2022   Procedure: BIOPSY;  Surgeon: Eloise Harman, DO;  [facility];  Service: Endoscopy;;   blood clot removal from base of brain  1990    CARDIOVERSION N/A 10/04/2020   Procedure: CARDIOVERSION;  Surgeon: Freada Bergeron, MD;  Location: [facility];  Service: Cardiovascular;  Laterality: N/A;   CARPAL TUNNEL RELEASE     CATARACT EXTRACTION W/PHACO Left 06/16/2013   Procedure: CATARACT EXTRACTION PHACO AND INTRAOCULAR LENS PLACEMENT ([place]);  Surgeon: Tonny Branch, MD;  Location: [facility];  Service: Ophthalmology;  Laterality: Left;  CDE:  12.18   CATARACT EXTRACTION W/PHACO Right 06/26/2013   Procedure: CATARACT EXTRACTION PHACO AND INTRAOCULAR LENS PLACEMENT ([facility]);  Surgeon: Tonny Branch, MD;  Location: [facility];  Service: Ophthalmology;  Laterality:  Right;  CDE:14.71   CESAREAN SECTION     CHOLECYSTECTOMY     COLONOSCOPY WITH PROPOFOL N/A 04/08/2019   Dr.Fields: diverticulosis, 11 simple adenomas removed, hemorrhoids. next colonoscopy in 3 years.    COLONOSCOPY WITH PROPOFOL N/A 03/20/2022   Procedure: COLONOSCOPY WITH PROPOFOL;  Surgeon: Eloise Harman, DO;  [facility];  Service: Endoscopy;  Laterality: N/A;  9:45am   DILATION AND CURETTAGE OF UTERUS     ESOPHAGOGASTRODUODENOSCOPY (EGD) WITH PROPOFOL N/A 04/08/2019   Dr. Oneida Alar: H.pylori gastritis   ESOPHAGOGASTRODUODENOSCOPY (EGD) WITH PROPOFOL N/A 03/20/2022   Procedure: ESOPHAGOGASTRODUODENOSCOPY (EGD) WITH PROPOFOL;  Surgeon: Eloise Harman, DO;  [facility];  Service: Endoscopy;  Laterality: N/A;   EYE SURGERY  12/2016   FOOT NEUROMA SURGERY Right    LOOP RECORDER INSERTION N/A 11/06/2017   Procedure: LOOP RECORDER INSERTION;  Surgeon: Thompson Grayer, MD;  Location: [place] [facility];  Service: Cardiovascular;  Laterality: N/A;   MOUTH SURGERY     POLYPECTOMY  04/08/2019   Procedure: POLYPECTOMY;  Surgeon: Danie Binder, MD;  [facility];  Service: Endoscopy;;   POLYPECTOMY  03/20/2022   Procedure: POLYPECTOMY;  Surgeon: Eloise Harman, DO;  [facility];  Service: Endoscopy;;   TEE WITHOUT CARDIOVERSION N/A 12/09/2020    Procedure: TRANSESOPHAGEAL ECHOCARDIOGRAM (TEE);  Surgeon: Lelon Perla, MD;  Location: [facility];  Service: Cardiovascular;  Laterality: N/A;   TONSILLECTOMY      Family History  Problem Relation Age of Onset   Emphysema Mother    Alcohol abuse Mother    Arthritis Mother    COPD Mother    Depression Mother    Hyperlipidemia Mother    Hypertension Mother    Heart attack Father    Cancer Father        lung   Heart disease Father    Diabetes Brother    COPD Brother    Other Maternal Grandmother        swine flu 1919   Colon cancer Neg Hx    Colon polyps Neg Hx    Liver disease Neg Hx     Allergies as of 07/04/2022 - Review Complete 03/20/2022  Allergen Reaction Noted   Trazodone and nefazodone Hives and Other (See Comments) 01/10/2017   Benicar [olmesartan]  11/30/2020   Nickel Other (See Comments) 10/04/2017   Tetanus toxoids Other (See Comments) 06/28/2018   Adhesive [tape] Itching 06/09/2013   Bactrim [sulfamethoxazole-trimethoprim] Itching and Rash 06/09/2013   Penicillins Itching, Rash, and Other (See Comments) 06/09/2013   Ppd [tuberculin purified protein derivative] Swelling and Other (See Comments) 06/09/2013    Social History   Socioeconomic History   Marital status: Widowed    Spouse name: Not on file   Number of children: 1   Years of education: 40   Highest education level: Not on file  Occupational History   Occupation: retired    Comment: catering, Engineer, maintenance  Tobacco Use   Smoking status: Former    Packs/day: 1.50    Types: Cigarettes    Start date: 09/25/1958    Quit date: 06/06/2001    Years since quitting: 21.0   Smokeless tobacco: Never  Vaping Use   Vaping Use: Never used  Substance and Sexual Activity   Alcohol use: Not Currently    Comment: used to drink an occasional alcoholic beverage. No hyistory of heavy alcohol use.   Drug use: Never   Sexual activity: Not Currently  Other Topics Concern   Not on file  Social History  Narrative   Lives alone   Son lives in [place]   TV, reads, puzzles   Social Determinants of Health   Financial Resource Strain: Not on file  Food Insecurity: Not on file  Transportation Needs: Not on file  Physical Activity: Not on file  Stress: Not on file  Social Connections: Not on file     Review of Systems   Gen: Denies fever, chills, anorexia. Denies fatigue, weakness, weight loss.  CV: Denies chest pain, palpitations, syncope, peripheral edema, and claudication. Resp: Denies dyspnea at rest, cough, wheezing, coughing up blood, and pleurisy. GI: See HPI Derm: Denies rash, itching, dry skin Psych: Denies depression, anxiety, memory loss, confusion. No homicidal or suicidal ideation.  Heme: Denies bruising, bleeding, and enlarged lymph nodes.   Physical Exam   There were no vitals taken for this visit.  General:   Alert and oriented. No distress noted. Pleasant and cooperative.  Head:  Normocephalic and atraumatic. Eyes:  Conjuctiva clear without scleral icterus. Mouth:  Oral mucosa pink and moist. Good dentition. No lesions. Lungs:  Clear to auscultation bilaterally. No wheezes, rales, or rhonchi. No distress.  Heart:  S1, S2 present without murmurs appreciated.  Abdomen:  +BS, soft, non-tender and non-distended. No rebound or guarding. No HSM or masses noted. Rectal: *** Msk:  Symmetrical without gross deformities. Normal posture. Extremities:  Without edema. Neurologic:  Alert and  oriented x4 Psych:  Alert and cooperative. Normal mood and affect.   Assessment  Linda [name] is a 76 y.o. female with a history of tubular adenomas, IDA, cirrhosis, anxiety, depression, diabetes, COPD, GERD, HTN, HLD, sleep apnea, Atrial flutter/a fib s/p ablation in 2022, H. Pylori*** presenting today for post procedure follow up.   Cirrhosis: First noted on CT in 2021. Recent RUQ [place] in June 2023 with nodular liver. Chronically mildly elevated LFTs and thrombocytopenia  with most recent labs in June with normal LFTs. Immunoglobulins, alpha 1 antitrypsin,  AFP wnl. Negative AMA, ANA, ASMA. Hep B, C, and A negative. Not immune to Hepatitis A or B and is in need of vaccination***. Recent EGD without evidence of varices.   IDA: Chronic. On eliquis for atrial fibrillation. Follows with hematology and maintained on oral iron every other day and iron infusions as needed. Last Hgb 13.3 on 01/30/22 with elevated iron and normal ferritin. Recent EGD and colonoscopy with tubular adenomas and gastritis , negative for H. Pylori. Continue to monitor and follow with hematology.   History of adenomatous colon polyps: Recent colonoscopy 03/20/22 with four tubular adenomas. 5 year surveillance recommended.   PLAN   ***     Venetia Night, MSN, FNP-BC, AGACNP-BC [facility]

## 2022-07-04 ENCOUNTER — Ambulatory Visit (INDEPENDENT_AMBULATORY_CARE_PROVIDER_SITE_OTHER): Payer: Medicare Other | Admitting: Gastroenterology

## 2022-07-04 ENCOUNTER — Encounter: Payer: Self-pay | Admitting: Gastroenterology

## 2022-07-04 VITALS — BP 160/74 | HR 80 | Temp 97.9°F | Ht 63.0 in | Wt 224.2 lb

## 2022-07-04 DIAGNOSIS — K7460 Unspecified cirrhosis of liver: Secondary | ICD-10-CM | POA: Diagnosis not present

## 2022-07-04 DIAGNOSIS — D509 Iron deficiency anemia, unspecified: Secondary | ICD-10-CM

## 2022-07-04 DIAGNOSIS — Z86010 Personal history of colonic polyps: Secondary | ICD-10-CM | POA: Diagnosis not present

## 2022-07-04 DIAGNOSIS — K219 Gastro-esophageal reflux disease without esophagitis: Secondary | ICD-10-CM | POA: Diagnosis not present

## 2022-07-04 NOTE — Patient Instructions (Addendum)
You are on recall list for repeat ultrasound in December.  Also need to complete your full lab work at that time as well.  I want you to have a basic metabolic panel today to assess your potassium before starting you on any diuretics.  Cirrhosis Nutrition:  High-protein diet from a primarily plant-based diet. Avoid red meat.  No raw or undercooked meat, seafood, or shellfish. Low-fat/cholesterol/carbohydrate diet. Limit sodium to no more than 2000 mg/day including everything that you eat and drink. Recommend at least 30 minutes of aerobic and resistance exercise 3 days/week.  Please call the pharmacy regarding your hepatitis a and B vaccinations.  Per guidelines you should have a second hepatitis A vaccination in 6-12 months and should have your next hepatitis B vaccination within the next 1 to 2 months and again in 4-6 months. If you received the combo shot TwinRix then you should have had a second dose in 1 week, a third dose in 21-30 days and a fourth dose in 12 mos. Please verify with pharmacy.   It was a pleasure to see you today. I want to create trusting relationships with patients. If you receive a survey regarding your visit,  I greatly appreciate you taking time to fill this out on paper or through your MyChart. I value your feedback.  Venetia Night, MSN, FNP-BC, AGACNP-BC [facility]

## 2022-07-20 DIAGNOSIS — E1165 Type 2 diabetes mellitus with hyperglycemia: Secondary | ICD-10-CM | POA: Diagnosis not present

## 2022-07-20 DIAGNOSIS — F419 Anxiety disorder, unspecified: Secondary | ICD-10-CM | POA: Diagnosis not present

## 2022-07-20 DIAGNOSIS — Z299 Encounter for prophylactic measures, unspecified: Secondary | ICD-10-CM | POA: Diagnosis not present

## 2022-07-20 DIAGNOSIS — I10 Essential (primary) hypertension: Secondary | ICD-10-CM | POA: Diagnosis not present

## 2022-07-24 DIAGNOSIS — Z23 Encounter for immunization: Secondary | ICD-10-CM | POA: Diagnosis not present

## 2022-07-25 ENCOUNTER — Telehealth: Payer: Self-pay | Admitting: *Deleted

## 2022-07-25 DIAGNOSIS — K7460 Unspecified cirrhosis of liver: Secondary | ICD-10-CM

## 2022-07-25 NOTE — Telephone Encounter (Signed)
Recall sent 

## 2022-07-25 NOTE — Telephone Encounter (Signed)
Patient on recall for 6 mth US 

## 2022-07-31 DIAGNOSIS — H3581 Retinal edema: Secondary | ICD-10-CM | POA: Diagnosis not present

## 2022-07-31 DIAGNOSIS — H35372 Puckering of macula, left eye: Secondary | ICD-10-CM | POA: Diagnosis not present

## 2022-08-03 ENCOUNTER — Inpatient Hospital Stay: Payer: Medicare Other | Attending: Physician Assistant

## 2022-08-03 ENCOUNTER — Other Ambulatory Visit: Payer: Self-pay | Admitting: *Deleted

## 2022-08-03 ENCOUNTER — Encounter: Payer: Self-pay | Admitting: Gastroenterology

## 2022-08-03 ENCOUNTER — Telehealth: Payer: Self-pay | Admitting: Gastroenterology

## 2022-08-03 DIAGNOSIS — Z87891 Personal history of nicotine dependence: Secondary | ICD-10-CM | POA: Diagnosis not present

## 2022-08-03 DIAGNOSIS — D509 Iron deficiency anemia, unspecified: Secondary | ICD-10-CM | POA: Insufficient documentation

## 2022-08-03 DIAGNOSIS — Z801 Family history of malignant neoplasm of trachea, bronchus and lung: Secondary | ICD-10-CM | POA: Insufficient documentation

## 2022-08-03 DIAGNOSIS — R911 Solitary pulmonary nodule: Secondary | ICD-10-CM | POA: Diagnosis not present

## 2022-08-03 DIAGNOSIS — I4891 Unspecified atrial fibrillation: Secondary | ICD-10-CM | POA: Diagnosis not present

## 2022-08-03 DIAGNOSIS — Z7951 Long term (current) use of inhaled steroids: Secondary | ICD-10-CM | POA: Diagnosis not present

## 2022-08-03 DIAGNOSIS — D500 Iron deficiency anemia secondary to blood loss (chronic): Secondary | ICD-10-CM

## 2022-08-03 DIAGNOSIS — Z7901 Long term (current) use of anticoagulants: Secondary | ICD-10-CM | POA: Diagnosis not present

## 2022-08-03 DIAGNOSIS — E538 Deficiency of other specified B group vitamins: Secondary | ICD-10-CM

## 2022-08-03 DIAGNOSIS — D696 Thrombocytopenia, unspecified: Secondary | ICD-10-CM | POA: Insufficient documentation

## 2022-08-03 DIAGNOSIS — Z79899 Other long term (current) drug therapy: Secondary | ICD-10-CM | POA: Insufficient documentation

## 2022-08-03 DIAGNOSIS — R519 Headache, unspecified: Secondary | ICD-10-CM | POA: Diagnosis not present

## 2022-08-03 DIAGNOSIS — K7460 Unspecified cirrhosis of liver: Secondary | ICD-10-CM

## 2022-08-03 DIAGNOSIS — F32A Depression, unspecified: Secondary | ICD-10-CM | POA: Insufficient documentation

## 2022-08-03 DIAGNOSIS — Z7984 Long term (current) use of oral hypoglycemic drugs: Secondary | ICD-10-CM | POA: Diagnosis not present

## 2022-08-03 LAB — IRON AND TIBC
Iron: 86 ug/dL (ref 28–170)
Saturation Ratios: 28 % (ref 10.4–31.8)
TIBC: 312 ug/dL (ref 250–450)
UIBC: 226 ug/dL

## 2022-08-03 LAB — CBC WITH DIFFERENTIAL/PLATELET
Abs Immature Granulocytes: 0.01 10*3/uL (ref 0.00–0.07)
Basophils Absolute: 0 10*3/uL (ref 0.0–0.1)
Basophils Relative: 0 %
Eosinophils Absolute: 0.2 10*3/uL (ref 0.0–0.5)
Eosinophils Relative: 6 %
HCT: 38.2 % (ref 36.0–46.0)
Hemoglobin: 12.5 g/dL (ref 12.0–15.0)
Immature Granulocytes: 0 %
Lymphocytes Relative: 22 %
Lymphs Abs: 0.9 10*3/uL (ref 0.7–4.0)
MCH: 33.8 pg (ref 26.0–34.0)
MCHC: 32.7 g/dL (ref 30.0–36.0)
MCV: 103.2 fL — ABNORMAL HIGH (ref 80.0–100.0)
Monocytes Absolute: 0.3 10*3/uL (ref 0.1–1.0)
Monocytes Relative: 7 %
Neutro Abs: 2.7 10*3/uL (ref 1.7–7.7)
Neutrophils Relative %: 65 %
Platelets: 107 10*3/uL — ABNORMAL LOW (ref 150–400)
RBC: 3.7 MIL/uL — ABNORMAL LOW (ref 3.87–5.11)
RDW: 13.5 % (ref 11.5–15.5)
WBC: 4.1 10*3/uL (ref 4.0–10.5)
nRBC: 0 % (ref 0.0–0.2)

## 2022-08-03 LAB — FERRITIN: Ferritin: 97 ng/mL (ref 11–307)

## 2022-08-03 LAB — VITAMIN B12: Vitamin B-12: 1327 pg/mL — ABNORMAL HIGH (ref 180–914)

## 2022-08-03 NOTE — Telephone Encounter (Signed)
Recall -- for CBC, CMP, PT/INR in December

## 2022-08-03 NOTE — Telephone Encounter (Signed)
Labs entered into Epic  °

## 2022-08-07 LAB — METHYLMALONIC ACID, SERUM: Methylmalonic Acid, Quantitative: 176 nmol/L (ref 0–378)

## 2022-08-08 DIAGNOSIS — I10 Essential (primary) hypertension: Secondary | ICD-10-CM | POA: Diagnosis not present

## 2022-08-08 DIAGNOSIS — Z299 Encounter for prophylactic measures, unspecified: Secondary | ICD-10-CM | POA: Diagnosis not present

## 2022-08-08 DIAGNOSIS — Z6841 Body Mass Index (BMI) 40.0 and over, adult: Secondary | ICD-10-CM | POA: Diagnosis not present

## 2022-08-08 DIAGNOSIS — Z7189 Other specified counseling: Secondary | ICD-10-CM | POA: Diagnosis not present

## 2022-08-08 DIAGNOSIS — E7800 Pure hypercholesterolemia, unspecified: Secondary | ICD-10-CM | POA: Diagnosis not present

## 2022-08-08 DIAGNOSIS — Z79899 Other long term (current) drug therapy: Secondary | ICD-10-CM | POA: Diagnosis not present

## 2022-08-08 DIAGNOSIS — Z1331 Encounter for screening for depression: Secondary | ICD-10-CM | POA: Diagnosis not present

## 2022-08-08 DIAGNOSIS — Z0000 Encounter for general adult medical examination without abnormal findings: Secondary | ICD-10-CM | POA: Diagnosis not present

## 2022-08-08 DIAGNOSIS — R5383 Other fatigue: Secondary | ICD-10-CM | POA: Diagnosis not present

## 2022-08-08 DIAGNOSIS — Z1339 Encounter for screening examination for other mental health and behavioral disorders: Secondary | ICD-10-CM | POA: Diagnosis not present

## 2022-08-08 DIAGNOSIS — Z789 Other specified health status: Secondary | ICD-10-CM | POA: Diagnosis not present

## 2022-08-09 NOTE — Progress Notes (Unsigned)
[place], [place] [address]   CLINIC:  Medical Oncology/Hematology  PCP:  [name], MD [address] [place] [address] [phone]   REASON FOR VISIT:  Follow-up for iron deficiency anemia and thrombocytopenia  CURRENT THERAPY: Intermittent IV iron  INTERVAL HISTORY:  Linda [name] 76 y.o. female returns for routine follow-up of her iron deficiency anemia and thrombocytopenia.  She was last seen by Tarri Abernethy PA-C on 02/03/2022.  She reports that after her IV iron (Feraheme x2) in May 2023 she was feeling great; she continues to feel fairly well and denies any severe fatigue.  No ice pica.  She denies any bright red blood per rectum, melena, or epistaxis.  She continues to bruise easily but denies any petechial rash.   She is on Eliquis for atrial fibrillation.  She has occasional headaches.  She denies any restless legs, chest pain, lightheadedness, or syncope. She has chronic shortness of breath with exertion, which is at baseline.  No B symptoms such as fever, chills, night sweats, or unintentional weight loss. She remains on her iron tablet every other day.  She reports 100% energy and 100% appetite.  She endorses that she is maintaining a stable weight at this time.   REVIEW OF SYSTEMS:  Review of Systems  Constitutional:  Negative for appetite change, chills, diaphoresis, fatigue, fever and unexpected weight change.  HENT:   Negative for lump/mass and nosebleeds.   Eyes:  Negative for eye problems.  Respiratory:  Positive for shortness of breath (with exertion). Negative for cough and hemoptysis.   Cardiovascular:  Negative for chest pain, leg swelling and palpitations.  Gastrointestinal:  Negative for abdominal pain, blood in stool, constipation, diarrhea, nausea and vomiting.  Genitourinary:  Negative for hematuria.   Skin: Negative.   Neurological:  Positive for headaches and numbness (neuropathy in feet). Negative for  dizziness and light-headedness.  Hematological:  Does not bruise/bleed easily.      PAST MEDICAL/SURGICAL HISTORY:  Past Medical History:  Diagnosis Date   Allergy    Anemia 07/25/2017   Anxiety    Arthritis    Atrial fibrillation [facility])    s/p ablation in March 2022, ILP remains in place   Cataract    COPD (chronic obstructive pulmonary disease) ([place])    Depression    Diabetes mellitus without complication ([place])    Emphysema of lung ([place])    GERD (gastroesophageal reflux disease) 10/28/2020   Hyperlipidemia    Hypertension    Oxygen deficiency    Sleep apnea    Typical atrial flutter ([place])    Past Surgical History:  Procedure Laterality Date   A-FLUTTER ABLATION N/A 10/04/2017   Procedure: A-FLUTTER ABLATION;  Surgeon: Thompson Grayer, MD;  Location: [place] [facility];  Service: Cardiovascular;  Laterality: N/A;   ABDOMINAL HYSTERECTOMY     fibroids   ATRIAL FIBRILLATION ABLATION N/A 12/09/2020   Procedure: ATRIAL FIBRILLATION ABLATION;  Surgeon: Thompson Grayer, MD;  Location: [place] [facility];  Service: Cardiovascular;  Laterality: N/A;   BIOPSY  03/20/2022   Procedure: BIOPSY;  Surgeon: Eloise Harman, DO;  [facility];  Service: Endoscopy;;   blood clot removal from base of brain  1990   CARDIOVERSION N/A 10/04/2020   Procedure: CARDIOVERSION;  Surgeon: Freada Bergeron, MD;  Location: [place];  Service: Cardiovascular;  Laterality: N/A;   CARPAL TUNNEL RELEASE     CATARACT EXTRACTION W/PHACO Left 06/16/2013  Procedure: CATARACT EXTRACTION PHACO AND INTRAOCULAR LENS PLACEMENT (IOC);  Surgeon: Tonny Branch, MD;  Location: [facility];  Service: Ophthalmology;  Laterality: Left;  CDE:  12.18   CATARACT EXTRACTION W/PHACO Right 06/26/2013   Procedure: CATARACT EXTRACTION PHACO AND INTRAOCULAR LENS PLACEMENT (IOC);  Surgeon: Tonny Branch, MD;  Location: [facility];  Service: Ophthalmology;  Laterality: Right;  CDE:14.71   CESAREAN SECTION     CHOLECYSTECTOMY      COLONOSCOPY WITH PROPOFOL N/A 04/08/2019   Dr.Fields: diverticulosis, 11 simple adenomas removed, hemorrhoids. next colonoscopy in 3 years.    COLONOSCOPY WITH PROPOFOL N/A 03/20/2022   Procedure: COLONOSCOPY WITH PROPOFOL;  Surgeon: Eloise Harman, DO;  [facility];  Service: Endoscopy;  Laterality: N/A;  9:45am   DILATION AND CURETTAGE OF UTERUS     ESOPHAGOGASTRODUODENOSCOPY (EGD) WITH PROPOFOL N/A 04/08/2019   Dr. Oneida Alar: H.pylori gastritis   ESOPHAGOGASTRODUODENOSCOPY (EGD) WITH PROPOFOL N/A 03/20/2022   Procedure: ESOPHAGOGASTRODUODENOSCOPY (EGD) WITH PROPOFOL;  Surgeon: Eloise Harman, DO;  [facility];  Service: Endoscopy;  Laterality: N/A;   EYE SURGERY  12/2016   FOOT NEUROMA SURGERY Right    LOOP RECORDER INSERTION N/A 11/06/2017   Procedure: LOOP RECORDER INSERTION;  Surgeon: Thompson Grayer, MD;  Location: [place] [facility];  Service: Cardiovascular;  Laterality: N/A;   MOUTH SURGERY     POLYPECTOMY  04/08/2019   Procedure: POLYPECTOMY;  Surgeon: Danie Binder, MD;  [facility];  Service: Endoscopy;;   POLYPECTOMY  03/20/2022   Procedure: POLYPECTOMY;  Surgeon: Eloise Harman, DO;  [facility];  Service: Endoscopy;;   TEE WITHOUT CARDIOVERSION N/A 12/09/2020   Procedure: TRANSESOPHAGEAL ECHOCARDIOGRAM (TEE);  Surgeon: Lelon Perla, MD;  Location[facility];  Service: Cardiovascular;  Laterality: N/A;   TONSILLECTOMY       SOCIAL HISTORY:  Social History   Socioeconomic History   Marital status: Widowed    Spouse name: Not on file   Number of children: 1   Years of education: 49   Highest education level: Not on file  Occupational History   Occupation: retired    Comment: catering, Engineer, maintenance  Tobacco Use   Smoking status: Former    Packs/day: 1.50    Types: Cigarettes    Start date: 09/25/1958    Quit date: 06/06/2001    Years since quitting: 21.1   Smokeless tobacco: Never  Vaping Use   Vaping Use: Never  used  Substance and Sexual Activity   Alcohol use: Not Currently    Comment: used to drink an occasional alcoholic beverage. No hyistory of heavy alcohol use.   Drug use: Never   Sexual activity: Not Currently  Other Topics Concern   Not on file  Social History Narrative   Lives alone   Son lives in [place]   TV, reads, puzzles   Social Determinants of Health   Financial Resource Strain: Not on file  Food Insecurity: Not on file  Transportation Needs: Not on file  Physical Activity: Not on file  Stress: Not on file  Social Connections: Not on file  Intimate Partner Violence: Not on file    FAMILY HISTORY:  Family History  Problem Relation Age of Onset   Emphysema Mother    Alcohol abuse Mother    Arthritis Mother    COPD Mother    Depression Mother    Hyperlipidemia Mother    Hypertension Mother    Heart attack Father  Cancer Father        lung   Heart disease Father    Diabetes Brother    COPD Brother    Other Maternal Grandmother        swine flu 1919   Colon cancer Neg Hx    Colon polyps Neg Hx    Liver disease Neg Hx     CURRENT MEDICATIONS:  Outpatient Encounter Medications as of 08/10/2022  Medication Sig   acetaminophen (TYLENOL) 500 MG tablet Take 1,000 mg by mouth every 6 (six) hours as needed for moderate pain or headache.   ADVAIR DISKUS 250-50 MCG/DOSE AEPB Inhale 1 puff into the lungs 2 (two) times daily.   ALPHA LIPOIC ACID PO Take 1 capsule by mouth daily.   apixaban (ELIQUIS) 5 MG TABS tablet Take 1 tablet (5 mg total) by mouth 2 (two) times daily.   Ascorbic Acid (VITAMIN C) 1000 MG tablet Take 1,000 mg by mouth daily.   calcium carbonate (TUMS EX) 750 MG chewable tablet Chew 1-2 tablets (750-1,500 mg total) by mouth at bedtime as needed for heartburn.   Calcium Carbonate-Vit D-Min (CALCIUM 600+D3 PLUS MINERALS) 600-800 MG-UNIT TABS Take 1 tablet by mouth daily.   cetirizine (ZYRTEC) 10 MG tablet Take 10 mg by mouth daily.    cholecalciferol (VITAMIN D3) 25 MCG (1000 UNIT) tablet Take 1,000 Units by mouth daily.   CINNAMON PO Take 2,400 mg by mouth at bedtime.   Cyanocobalamin (VITAMIN B 12) 500 MCG TABS Take by mouth.   DULoxetine (CYMBALTA) 30 MG capsule Take 30 mg by mouth daily.   ELDERBERRY PO Take 1 tablet by mouth daily.   ferrous sulfate 325 (65 FE) MG EC tablet Take 325 mg by mouth every other day.   FREESTYLE LITE test strip    glipiZIDE (GLUCOTROL) 5 MG tablet Take 5 mg by mouth daily.   Melatonin 10 MG TABS Take 10 mg by mouth at bedtime.   metFORMIN (GLUCOPHAGE) 500 MG tablet Take 500 mg by mouth 2 (two) times daily.   metoprolol succinate (TOPROL-XL) 25 MG 24 hr tablet TAKE 1 TABLET DAILY (DOSE DECREASED 03/25/21)   Multiple Vitamin (MULTIVITAMIN WITH MINERALS) TABS tablet Take 1 tablet by mouth daily.   Omega-3 Fatty Acids (FISH OIL) 1000 MG CAPS Take 1,000 mg by mouth daily.   OXYGEN Inhale 2 L into the lungs at bedtime. With BPAP   pravastatin (PRAVACHOL) 20 MG tablet Take 1 tablet (20 mg total) daily by mouth.   PROAIR HFA 108 (90 Base) MCG/ACT inhaler Inhale 2 puffs into the lungs every 4 (four) hours as needed for wheezing or shortness of breath.   Probiotic Product (PROBIOTIC DAILY PO) Take 1 capsule by mouth daily.    Red Yeast Rice 600 MG TABS Take 1,200 mg by mouth daily.   triamcinolone cream (KENALOG) 0.1 % Apply 1 Application topically 2 (two) times daily as needed (itching).   Zinc 30 MG CAPS Take 30 mg by mouth daily.   No facility-administered encounter medications on file as of 08/10/2022.    ALLERGIES:  Allergies  Allergen Reactions   Trazodone And Nefazodone Hives and Other (See Comments)    "blisters my skin"    Benicar [Olmesartan]     Pt does not remember    Nickel Other (See Comments)    "oozing"   Tetanus Toxoids Other (See Comments)    Left a knot   Adhesive [Tape] Itching   Bactrim [Sulfamethoxazole-Trimethoprim] Itching and Rash   Penicillins Itching,  Rash and  Other (See Comments)    Has patient had a PCN reaction causing immediate rash, facial/tongue/throat swelling, SOB or lightheadedness with hypotension: Unknown Has patient had a PCN reaction causing severe rash involving mucus membranes or skin necrosis: Unknown Has patient had a PCN reaction that required hospitalization: No Has patient had a PCN reaction occurring within the last 10 years: No If all of the above answers are "NO", then may proceed with Cephalosporin use   Ppd [Tuberculin Purified Protein Derivative] Swelling and Other (See Comments)    Local arm swelling     PHYSICAL EXAM:  ECOG PERFORMANCE STATUS: 1 - Symptomatic but completely ambulatory  There were no vitals filed for this visit. There were no vitals filed for this visit. Physical Exam Constitutional:      Appearance: Normal appearance. She is obese.  HENT:     Head: Normocephalic and atraumatic.     Mouth/Throat:     Mouth: Mucous membranes are moist.  Eyes:     Extraocular Movements: Extraocular movements intact.     Pupils: Pupils are equal, round, and reactive to light.  Cardiovascular:     Rate and Rhythm: Normal rate and regular rhythm.     Pulses: Normal pulses.     Heart sounds: Normal heart sounds.  Pulmonary:     Effort: Pulmonary effort is normal.     Breath sounds: Normal breath sounds.  Abdominal:     General: Abdomen is protuberant. Bowel sounds are normal.     Palpations: Abdomen is soft. There is no hepatomegaly or splenomegaly.     Tenderness: There is no abdominal tenderness.  Musculoskeletal:        General: No swelling.     Right lower leg: Edema (trace) present.     Left lower leg: Edema (trace) present.  Lymphadenopathy:     Cervical: No cervical adenopathy.  Skin:    General: Skin is warm and dry.  Neurological:     General: No focal deficit present.     Mental Status: She is alert and oriented to person, place, and time.  Psychiatric:        Mood and Affect: Mood normal.         Behavior: Behavior normal.     LABORATORY DATA:  I have reviewed the labs as listed.  CBC    Component Value Date/Time   WBC 4.1 08/03/2022 1017   RBC 3.70 (L) 08/03/2022 1017   HGB 12.5 08/03/2022 1017   HGB 13.6 11/30/2020 0930   HCT 38.2 08/03/2022 1017   HCT 41.5 11/30/2020 0930   PLT 107 (L) 08/03/2022 1017   PLT 129 (L) 11/30/2020 0930   MCV 103.2 (H) 08/03/2022 1017   MCV 99 (H) 11/30/2020 0930   MCH 33.8 08/03/2022 1017   MCHC 32.7 08/03/2022 1017   RDW 13.5 08/03/2022 1017   RDW 13.3 11/30/2020 0930   LYMPHSABS 0.9 08/03/2022 1017   LYMPHSABS 1.0 11/30/2020 0930   MONOABS 0.3 08/03/2022 1017   EOSABS 0.2 08/03/2022 1017   EOSABS 0.1 11/30/2020 0930   BASOSABS 0.0 08/03/2022 1017   BASOSABS 0.0 11/30/2020 0930      Latest Ref Rng & Units 03/16/2022    9:44 AM 10/31/2021   11:13 AM 07/15/2021   10:32 AM  CMP  Glucose 70 - 99 mg/dL 141  225  180   BUN 8 - 23 mg/dL _0 Creatinine 0.44 - 1.00 mg/dL 0.83  0.83  0.84   Sodium 135 - 145 mmol/L 141  137  140   Potassium 3.5 - 5.1 mmol/L 3.8  4.0  4.2   Chloride 98 - 111 mmol/L 104  102  102   CO2 22 - 32 mmol/L 33  27  30   Calcium 8.9 - 10.3 mg/dL 9.6  10.0  9.7   Total Protein 6.5 - 8.1 g/dL 6.4   6.9   Total Bilirubin 0.3 - 1.2 mg/dL 1.5   1.7   Alkaline Phos 38 - 126 U/L 90   103   AST 15 - 41 U/L 31   34   ALT 0 - 44 U/L 32   27     DIAGNOSTIC IMAGING:  I have independently reviewed the relevant imaging and discussed with the patient.   ASSESSMENT & PLAN: 1.  Iron deficiency anemia: - Colonoscopy on 04/08/2019 with normal ileum, moderate diverticulosis in the rectosigmoid colon, external and internal hemorrhoids.  Pathology consistent with tubular adenomas. - EGD (03/20/2022): Gastritis.  No esophageal varices. - Colonoscopy (03/20/2022): Nonbleeding internal hemorrhoids, diverticulosis, polyps x4 - Likely secondary to malabsorption, patient is on oral iron without improvement - She received  IV Feraheme on 02/07/2022 and 02/14/2022 - She is taking iron tablet every other day - Patient denies bright red blood per rectum or melena    - No current fatigue or pica - Most recent labs (08/03/2022): Hgb 12.5/MCV 103.2 (macrocytosis in the setting of liver cirrhosis), ferritin 97, iron saturation 28%  - PLAN: Continue iron tablet every other day. - No indication for IV iron at this time. - CBC and iron panel with RTC in 6 months, or sooner if needed based on symptoms  - Continue follow-up with gastroenterology  2.  Thrombocytopenia - Mild thrombocytopenia since 2018 - CBC on 09/23/2020 did note some platelet clumping could be skewing the counts - CT scan of the chest on 01/05/2017 at [facility] showed borderline splenomegaly.  No comment on the liver. - Abdominal ultrasound (08/04/2021): Heterogeneous liver with slightly nodular contour suspicious for either diffuse hepatocellular disease or cirrhosis; mild splenomegaly 575 cc. - She follows with [facility] for cirrhosis - She admits to easy bruising but denies any petechial rash or abnormal bleeding events. - Most recent labs (08/03/2022): Platelets 107, WBC 4.1.  Elevated B12 at 1327, normal methylmalonic acid.  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  May also have some element of ITP.  Less likely would be early myelodysplasia. - PLAN: No indication for treatment at this time - Currently taking vitamin B12 1000 mcg daily per PCP. - Continue to monitor with repeat CBC, B12 at follow-up visit in 6 months. - If any significant deviation from baseline, would consider bone marrow biopsy.   3.  Atrial fibrillation: - Continue Eliquis twice daily.    4.  Pulmonary nodule - Patient is following with PCP (Dr. Woody Seller)    PLAN SUMMARY: >> Labs in 6 months (CBC/D, CMP, ferritin, iron/TIBC, B12, MMA) >> Office visit in 6  months, 1 week after labs   All questions were answered. The patient knows to call the clinic with any problems, questions or concerns.  Medical decision making: Moderate   Time spent on visit: I spent 20 minutes counseling the patient face to face. The total time spent in the appointment was 30 minutes and more than 50% was on counseling.   Eugene Garnet  Hervey Ard, PA-C  08/10/22 11:22 AM

## 2022-08-10 ENCOUNTER — Other Ambulatory Visit: Payer: Self-pay

## 2022-08-10 ENCOUNTER — Inpatient Hospital Stay (HOSPITAL_BASED_OUTPATIENT_CLINIC_OR_DEPARTMENT_OTHER): Payer: Medicare Other | Admitting: Physician Assistant

## 2022-08-10 VITALS — BP 131/68 | HR 73 | Temp 98.3°F | Resp 16 | Wt 221.0 lb

## 2022-08-10 DIAGNOSIS — D509 Iron deficiency anemia, unspecified: Secondary | ICD-10-CM

## 2022-08-10 DIAGNOSIS — I4891 Unspecified atrial fibrillation: Secondary | ICD-10-CM | POA: Diagnosis not present

## 2022-08-10 DIAGNOSIS — Z7901 Long term (current) use of anticoagulants: Secondary | ICD-10-CM | POA: Diagnosis not present

## 2022-08-10 DIAGNOSIS — R519 Headache, unspecified: Secondary | ICD-10-CM | POA: Diagnosis not present

## 2022-08-10 DIAGNOSIS — E538 Deficiency of other specified B group vitamins: Secondary | ICD-10-CM | POA: Diagnosis not present

## 2022-08-10 DIAGNOSIS — D696 Thrombocytopenia, unspecified: Secondary | ICD-10-CM

## 2022-08-10 DIAGNOSIS — Z87891 Personal history of nicotine dependence: Secondary | ICD-10-CM | POA: Diagnosis not present

## 2022-08-10 DIAGNOSIS — D500 Iron deficiency anemia secondary to blood loss (chronic): Secondary | ICD-10-CM

## 2022-08-10 NOTE — Patient Instructions (Signed)
[place] at [facility] Discharge Instructions  You were seen today by Tarri Abernethy PA-C for your iron deficiency anemia and low platelets.  Your blood and iron levels look great.  You do not need any IV iron at this time.  Continue to take your iron tablets every other day.  Your platelet levels remain somewhat low, which I believe is related to your liver disease.  You do not need treatment at this time, but we will continue to monitor these levels going forward.  FOLLOW-UP APPOINTMENT: Labs and office visit in 6 months  ** Thank you for trusting me with your healthcare!  I strive to provide all of my patients with quality care at each visit.  If you receive a survey for this visit, I would be so grateful to you for taking the time to provide feedback.  Thank you in advance!  ~ Kirke Breach                   Dr. Derek Jack   &   Tarri Abernethy, PA-C   - - - - - - - - - - - - - - - - - -     Thank you for choosing [place] at [facility] to provide your oncology and hematology care.  To afford each patient quality time with our provider, please arrive at least 15 minutes before your scheduled appointment time.   If you have a lab appointment with the [place] please come in thru the Main Entrance and check in at the main information desk.  You need to re-schedule your appointment should you arrive 10 or more minutes late.  We strive to give you quality time with our providers, and arriving late affects you and other patients whose appointments are after yours.  Also, if you no show three or more times for appointments you may be dismissed from the clinic at the providers discretion.     Again, thank you for choosing [facility].  Our hope is that these requests will decrease the amount of time that you wait before being seen by our physicians.        _____________________________________________________________  Should you have questions after your visit to [facility], please contact our office at [phone] and follow the prompts.  Our office hours are 8:00 a.m. and 4:30 p.m. Monday - Friday.  Please note that voicemails left after 4:00 p.m. may not be returned until the following business day.  We are closed weekends and major holidays.  You do have access to a nurse 24-7, just call the main number to the clinic [phone] and do not press any options, hold on the line and a nurse will answer the phone.    For prescription refill requests, have your pharmacy contact our office and allow 72 hours.    Due to Covid, you will need to wear a mask upon entering the hospital. If you do not have a mask, a mask will be given to you at the Main Entrance upon arrival. For doctor visits, patients may have 1 support person age 32 or older with them. For treatment visits, patients can not have anyone with them due to social distancing guidelines and our immunocompromised population.

## 2022-08-25 ENCOUNTER — Other Ambulatory Visit: Payer: Self-pay | Admitting: *Deleted

## 2022-08-25 DIAGNOSIS — K7460 Unspecified cirrhosis of liver: Secondary | ICD-10-CM

## 2022-08-28 DIAGNOSIS — K7460 Unspecified cirrhosis of liver: Secondary | ICD-10-CM | POA: Diagnosis not present

## 2022-08-28 DIAGNOSIS — J069 Acute upper respiratory infection, unspecified: Secondary | ICD-10-CM | POA: Diagnosis not present

## 2022-08-28 DIAGNOSIS — I4891 Unspecified atrial fibrillation: Secondary | ICD-10-CM | POA: Diagnosis not present

## 2022-08-28 DIAGNOSIS — J9611 Chronic respiratory failure with hypoxia: Secondary | ICD-10-CM | POA: Diagnosis not present

## 2022-08-28 DIAGNOSIS — Z299 Encounter for prophylactic measures, unspecified: Secondary | ICD-10-CM | POA: Diagnosis not present

## 2022-08-31 NOTE — Addendum Note (Signed)
Addended by: Cheron Every on: 08/31/2022 10:15 AM   Modules accepted: Orders

## 2022-08-31 NOTE — Telephone Encounter (Signed)
Pt called in to schedule [place].   Called pt back and gave appt details. She voiced understanding

## 2022-09-04 ENCOUNTER — Other Ambulatory Visit (HOSPITAL_COMMUNITY)
Admission: RE | Admit: 2022-09-04 | Discharge: 2022-09-04 | Disposition: A | Discharge status: Home / Self Care | Payer: Medicare Other | Source: Ambulatory Visit | Attending: Gastroenterology | Admitting: Gastroenterology

## 2022-09-04 DIAGNOSIS — K7460 Unspecified cirrhosis of liver: Secondary | ICD-10-CM | POA: Diagnosis not present

## 2022-09-04 LAB — COMPREHENSIVE METABOLIC PANEL
ALT: 27 U/L (ref 0–44)
AST: 44 U/L — ABNORMAL HIGH (ref 15–41)
Albumin: 3.5 g/dL (ref 3.5–5.0)
Alkaline Phosphatase: 75 U/L (ref 38–126)
Anion gap: 6 (ref 5–15)
BUN: 16 mg/dL (ref 8–23)
CO2: 27 mmol/L (ref 22–32)
Calcium: 9.4 mg/dL (ref 8.9–10.3)
Chloride: 107 mmol/L (ref 98–111)
Creatinine, Ser: 0.91 mg/dL (ref 0.44–1.00)
GFR, Estimated: >60 mL/min (ref >60)
Glucose, Bld: 140 mg/dL — ABNORMAL HIGH (ref 70–99)
Potassium: 3.7 mmol/L (ref 3.5–5.1)
Sodium: 140 mmol/L (ref 135–145)
Total Bilirubin: 1.3 mg/dL — ABNORMAL HIGH (ref 0.3–1.2)
Total Protein: 6.6 g/dL (ref 6.5–8.1)

## 2022-09-04 LAB — CBC WITH DIFFERENTIAL/PLATELET
Abs Immature Granulocytes: 0.01 10*3/uL (ref 0.00–0.07)
Basophils Absolute: 0 10*3/uL (ref 0.0–0.1)
Basophils Relative: 0 %
Eosinophils Absolute: 0.1 10*3/uL (ref 0.0–0.5)
Eosinophils Relative: 4 %
HCT: 37.2 % (ref 36.0–46.0)
Hemoglobin: 12.2 g/dL (ref 12.0–15.0)
Immature Granulocytes: 0 %
Lymphocytes Relative: 23 %
Lymphs Abs: 0.7 10*3/uL (ref 0.7–4.0)
MCH: 33.8 pg (ref 26.0–34.0)
MCHC: 32.8 g/dL (ref 30.0–36.0)
MCV: 103 fL — ABNORMAL HIGH (ref 80.0–100.0)
Monocytes Absolute: 0.2 10*3/uL (ref 0.1–1.0)
Monocytes Relative: 8 %
Neutro Abs: 2 10*3/uL (ref 1.7–7.7)
Neutrophils Relative %: 65 %
Platelets: 79 10*3/uL — ABNORMAL LOW (ref 150–400)
RBC: 3.61 MIL/uL — ABNORMAL LOW (ref 3.87–5.11)
RDW: 14.5 % (ref 11.5–15.5)
WBC: 3.1 10*3/uL — ABNORMAL LOW (ref 4.0–10.5)
nRBC: 0 % (ref 0.0–0.2)

## 2022-09-04 LAB — PROTIME-INR
INR: 1.5 — ABNORMAL HIGH (ref 0.8–1.2)
Prothrombin Time: 18 seconds — ABNORMAL HIGH (ref 11.4–15.2)

## 2022-09-06 ENCOUNTER — Encounter: Payer: Self-pay | Admitting: *Deleted

## 2022-09-08 ENCOUNTER — Ambulatory Visit (HOSPITAL_COMMUNITY)
Admission: RE | Admit: 2022-09-08 | Discharge: 2022-09-08 | Disposition: A | Discharge status: Home / Self Care | Payer: Medicare Other | Source: Ambulatory Visit | Attending: Gastroenterology | Admitting: Gastroenterology

## 2022-09-08 DIAGNOSIS — K7460 Unspecified cirrhosis of liver: Secondary | ICD-10-CM | POA: Diagnosis not present

## 2022-09-22 DIAGNOSIS — J069 Acute upper respiratory infection, unspecified: Secondary | ICD-10-CM | POA: Diagnosis not present

## 2022-09-22 DIAGNOSIS — Z299 Encounter for prophylactic measures, unspecified: Secondary | ICD-10-CM | POA: Diagnosis not present

## 2022-09-22 DIAGNOSIS — J449 Chronic obstructive pulmonary disease, unspecified: Secondary | ICD-10-CM | POA: Diagnosis not present

## 2022-09-22 DIAGNOSIS — I4891 Unspecified atrial fibrillation: Secondary | ICD-10-CM | POA: Diagnosis not present

## 2022-09-22 DIAGNOSIS — J9611 Chronic respiratory failure with hypoxia: Secondary | ICD-10-CM | POA: Diagnosis not present

## 2022-09-29 ENCOUNTER — Ambulatory Visit: Payer: Medicare Other | Admitting: Internal Medicine

## 2022-10-13 ENCOUNTER — Ambulatory Visit: Payer: Medicare Other | Attending: Internal Medicine | Admitting: Cardiovascular Disease

## 2022-10-13 ENCOUNTER — Encounter: Payer: Self-pay | Admitting: Cardiovascular Disease

## 2022-10-13 VITALS — BP 134/70 | HR 70 | Ht 62.5 in | Wt 221.0 lb

## 2022-10-13 DIAGNOSIS — Z7901 Long term (current) use of anticoagulants: Secondary | ICD-10-CM | POA: Diagnosis not present

## 2022-10-13 DIAGNOSIS — I4892 Unspecified atrial flutter: Secondary | ICD-10-CM | POA: Diagnosis not present

## 2022-10-13 DIAGNOSIS — I4819 Other persistent atrial fibrillation: Secondary | ICD-10-CM

## 2022-10-13 NOTE — Progress Notes (Signed)
PCP: [name], MD   Primary EP: Dr Salli Quarry is a 77 y.o. female who presents today for routine electrophysiology followup.  Since last being seen in our clinic, the patient reports doing very well.  Today, she denies symptoms of palpitations, chest pain, shortness of breath,  lower extremity edema, dizziness, presyncope, or syncope.  The patient is otherwise without complaint today.   she has no device related complaints -- no new tenderness, drainage, redness.   Past Medical History:  Diagnosis Date   Allergy    Anemia 07/25/2017   Anxiety    Arthritis    Atrial fibrillation [facility])    s/p ablation in March 2022, ILP remains in place   Cataract    COPD (chronic obstructive pulmonary disease) ([place])    Depression    Diabetes mellitus without complication ([place])    Emphysema of lung ([place])    GERD (gastroesophageal reflux disease) 10/28/2020   Hyperlipidemia    Hypertension    Oxygen deficiency    Sleep apnea    Typical atrial flutter ([place])    Past Surgical History:  Procedure Laterality Date   A-FLUTTER ABLATION N/A 10/04/2017   Procedure: A-FLUTTER ABLATION;  Surgeon: Thompson Grayer, MD;  Location: [place] [facility];  Service: Cardiovascular;  Laterality: N/A;   ABDOMINAL HYSTERECTOMY     fibroids   ATRIAL FIBRILLATION ABLATION N/A 12/09/2020   Procedure: ATRIAL FIBRILLATION ABLATION;  Surgeon: Thompson Grayer, MD;  Location: [place] [facility];  Service: Cardiovascular;  Laterality: N/A;   BIOPSY  03/20/2022   Procedure: BIOPSY;  Surgeon: Eloise Harman, DO;  [facility];  Service: Endoscopy;;   blood clot removal from base of brain  1990   CARDIOVERSION N/A 10/04/2020   Procedure: CARDIOVERSION;  Surgeon: Freada Bergeron, MD;  Location: [place];  Service: Cardiovascular;  Laterality: N/A;   CARPAL TUNNEL RELEASE     CATARACT EXTRACTION W/PHACO Left 06/16/2013   Procedure: CATARACT EXTRACTION PHACO AND INTRAOCULAR LENS PLACEMENT  ([place]);  Surgeon: Tonny Branch, MD;  Location: [facility];  Service: Ophthalmology;  Laterality: Left;  CDE:  12.18   CATARACT EXTRACTION W/PHACO Right 06/26/2013   Procedure: CATARACT EXTRACTION PHACO AND INTRAOCULAR LENS PLACEMENT (IOC);  Surgeon: Tonny Branch, MD;  Location: [facility];  Service: Ophthalmology;  Laterality: Right;  CDE:14.71   CESAREAN SECTION     CHOLECYSTECTOMY     COLONOSCOPY WITH PROPOFOL N/A 04/08/2019   Dr.Fields: diverticulosis, 11 simple adenomas removed, hemorrhoids. next colonoscopy in 3 years.    COLONOSCOPY WITH PROPOFOL N/A 03/20/2022   Procedure: COLONOSCOPY WITH PROPOFOL;  Surgeon: Eloise Harman, DO;  [facility];  Service: Endoscopy;  Laterality: N/A;  9:45am   DILATION AND CURETTAGE OF UTERUS     ESOPHAGOGASTRODUODENOSCOPY (EGD) WITH PROPOFOL N/A 04/08/2019   Dr. Oneida Alar: H.pylori gastritis   ESOPHAGOGASTRODUODENOSCOPY (EGD) WITH PROPOFOL N/A 03/20/2022   Procedure: ESOPHAGOGASTRODUODENOSCOPY (EGD) WITH PROPOFOL;  Surgeon: Eloise Harman, DO;  [facility];  Service: Endoscopy;  Laterality: N/A;   EYE SURGERY  12/2016   FOOT NEUROMA SURGERY Right    LOOP RECORDER INSERTION N/A 11/06/2017   Procedure: LOOP RECORDER INSERTION;  Surgeon: Thompson Grayer, MD;  Location: [place] [facility];  Service: Cardiovascular;  Laterality: N/A;   MOUTH SURGERY     POLYPECTOMY  04/08/2019   Procedure: POLYPECTOMY;  Surgeon: Danie Binder, MD;  [facility];  Service: Endoscopy;;   POLYPECTOMY  03/20/2022   Procedure: POLYPECTOMY;  Surgeon: Eloise Harman, DO;  [facility];  Service: Endoscopy;;   TEE WITHOUT CARDIOVERSION N/A 12/09/2020   Procedure: TRANSESOPHAGEAL ECHOCARDIOGRAM (TEE);  Surgeon: Lelon Perla, MD;  Location: [facility];  Service: Cardiovascular;  Laterality: N/A;   TONSILLECTOMY      ROS- all systems are reviewed and negatives except as per HPI above  Current Outpatient Medications  Medication Sig Dispense  Refill   acetaminophen (TYLENOL) 500 MG tablet Take 1,000 mg by mouth every 6 (six) hours as needed for moderate pain or headache.     ALPHA LIPOIC ACID PO Take 1 capsule by mouth daily.     apixaban (ELIQUIS) 5 MG TABS tablet Take 1 tablet (5 mg total) by mouth 2 (two) times daily. 180 tablet 3   Ascorbic Acid (VITAMIN C) 1000 MG tablet Take 1,000 mg by mouth daily.     busPIRone (BUSPAR) 5 MG tablet Take 5 mg by mouth 2 (two) times daily.     calcium carbonate (TUMS EX) 750 MG chewable tablet Chew 1-2 tablets (750-1,500 mg total) by mouth at bedtime as needed for heartburn. 30 tablet 11   Calcium Carbonate-Vit D-Min (CALCIUM 600+D3 PLUS MINERALS) 600-800 MG-UNIT TABS Take 1 tablet by mouth daily.     cetirizine (ZYRTEC) 10 MG tablet Take 10 mg by mouth daily.     cholecalciferol (VITAMIN D3) 25 MCG (1000 UNIT) tablet Take 1,000 Units by mouth daily.     CINNAMON PO Take 2,400 mg by mouth at bedtime.     Coenzyme Q10 (CO Q 10) 100 MG CAPS Take 100 mg by mouth daily.     Cyanocobalamin (VITAMIN B 12) 500 MCG TABS Take by mouth.     DULoxetine (CYMBALTA) 30 MG capsule Take 30 mg by mouth daily.     ELDERBERRY PO Take 1 tablet by mouth daily.     ferrous sulfate 325 (65 FE) MG EC tablet Take 325 mg by mouth every other day.     fluticasone-salmeterol (WIXELA INHUB) 250-50 MCG/ACT AEPB Inhale 1 puff into the lungs in the morning and at bedtime.     FREESTYLE LITE test strip      glipiZIDE (GLUCOTROL) 5 MG tablet Take 5 mg by mouth daily.     Melatonin 10 MG TABS Take 10 mg by mouth at bedtime.     metFORMIN (GLUCOPHAGE) 500 MG tablet Take 500 mg by mouth daily.     metoprolol succinate (TOPROL-XL) 25 MG 24 hr tablet TAKE 1 TABLET DAILY (DOSE DECREASED 03/25/21) 90 tablet 3   Multiple Vitamin (MULTIVITAMIN WITH MINERALS) TABS tablet Take 1 tablet by mouth daily.     NON FORMULARY B Pap Machine     Omeprazole Magnesium (PRILOSEC PO) Take by mouth.     OXYGEN Inhale 2 L into the lungs at bedtime.  With BPAP     pravastatin (PRAVACHOL) 20 MG tablet Take 1 tablet (20 mg total) daily by mouth. 90 tablet 3   PROAIR HFA 108 (90 Base) MCG/ACT inhaler Inhale 2 puffs into the lungs every 4 (four) hours as needed for wheezing or shortness of breath.     Probiotic Product (PROBIOTIC DAILY PO) Take 1 capsule by mouth daily.      Red Yeast Rice 600 MG TABS Take 1,200 mg by mouth daily.     triamcinolone cream (KENALOG) 0.1 % Apply 1 Application topically 2 (two) times daily as needed (itching).     Zinc 30  MG CAPS Take 30 mg by mouth daily.     No current facility-administered medications for this visit.    Physical Exam: Vitals:   10/13/22 1117  BP: 134/70  Pulse: 70  SpO2: 95%  Weight: 221 lb (100.2 kg)  Height: 5' 2.5" (1.588 m)     Gen: Appears comfortable, well-nourished CV: RRR, no dependent edema The device site is normal -- no tenderness, edema, drainage, redness, threatened erosion.  Pulm: breathing easily   Wt Readings from Last 3 Encounters:  10/13/22 221 lb (100.2 kg)  08/10/22 221 lb (100.2 kg)  07/04/22 224 lb 3.2 oz (101.7 kg)     Assessment and Plan:  Persistent afib Appears well controlled post ablation  Continue eliquis ILR is dead   We again discussed ILR removal. She does not wish to have her ILR removed at this time.  She is aware to contact my office to arrange removal of her ILR with me in [facility] street office should she change her mind. I advised her to monitor her heart rate daily  2. OSA Uses BiPAP  3. Obesity Body mass index is 39.78 kg/m. Lifestyle modification advised  4. HL Stable No change required today  Risks, benefits and potential toxicities for medications prescribed and/or refilled reviewed with patient today.   Return in a year  Melida Quitter, MD 10/13/2022 11:37 AM

## 2022-10-13 NOTE — Patient Instructions (Addendum)
Medication Instructions:  Continue all current medications.  Labwork: none  Testing/Procedures: none  Follow-Up: 1 year - Dr.  Mealor    Any Other Special Instructions Will Be Listed Below (If Applicable).   If you need a refill on your cardiac medications before your next appointment, please call your pharmacy.  

## 2022-10-14 NOTE — Progress Notes (Signed)
GI Office Note    Referring Provider: [name], MD Primary Care Physician:  [name], MD Primary Gastroenterologist: Elon Alas. [name], DO  Date:  10/16/2022  ID:  Linda [name], DOB 07-Mar-1946, MRN [id number]   Chief Complaint   Chief Complaint  Patient presents with   Follow-up   History of Present Illness  Linda [name] is a 77 y.o. female with a history of colon polyps, IDA following with hematology maintained on oral iron and periodic IV iron, anxiety, depression, diabetes, COPD, GERD, HTN, HLD, sleep apnea, afib/aflutter s/p ablation in 2022, H. Pylori s/p eradication, and cirrhosis presenting today for follow up.   Previous cirrhosis workup with normal immunoglobulins, ANA, AMA, Alpha-1-antitrypsin, AFP. Celiac screen negative. Hep B core antibody non-reactive.   Colonoscopy June 2023 -internal hemorrhoids -sigmoid diverticulosis -two transverse polyps -two rectosigmoid polyps -biopsy revealed as tubular adenomas -Repeat in 5 years  EGD June 2023: -gastritis s/p biopsy (hyperemia) -normal duodenum -no varices -repeat in 3 years  Last office visit 07/04/22. Intermittent LUQ pain couple times per week for 6 weeks possibly related to overeating.  Improved with an acids.  Reported a 15 to 17 pound weight gain in a few months, working on being more active however this also increases her appetite.  Having difficulty with diet options.  Been cutting back on meat and avoiding pork.  Complain of chronic swelling in her legs but unsure about abdominal swelling.  Shortness of breath at baseline with exertion.  Denied chest pain.  Not adding extra salt to food and trying to avoid canned foods.  Iron every other day without constipation.  Taking daily probiotic.  Has started hepatitis a and B vaccination.  Advised to start famotidine 20 mg daily.  Cirrhosis diet reinforced.  Advised to check BMP and possibly start low-dose diuretic for edema.  Advised to follow-up in 2  months.  Labs 09/04/22: INR 1.5, Na 140, alb 3.5, AST 44, ALT 27, Cr 0.91, alk phos 75, T.bili 1.3, Hgb 12.2, WBC 3, MCV 103, plts 79  RUQ [place] 09/08/22: -CBD 35m s/p cholecystectomy -no focal liver lesion -normal doppler -cirrhotic morphology  Today; Cirrhosis history Hematemesis/coffee ground emesis: no History of variceal bleeding: no Abdominal pain: no Abdominal distention/worsening ascites: None. Peripheral edema: very little but improves with movement and elevation.  Fever/chills: Sick before thanksgiving and until new years - was given abx and inhaler and steroids. Negative for flu, COVID.  Episodes of confusion/disorientation: None.  Number of daily bowel movements: at least 1, straining since being sick. Getting ready to start metamucil.  Taking diuretics?:  None Date of last EGD: June 2023  (due in 2026) Prior history of banding?: No Prior episodes of SBP: No Last time liver imaging was performed: Dec 2023 No melena or brpbr.   Has had a couple of nose bleeds but could be relate to oxygen therapy th last couple months. Has humidifier but not hooked it you yet.  Hepatitis A and B vaccination status: Due for repeat vaccines - has first series, needs the second. Has it written down at home.   MELD score: 12 (Dec 2023)  Drinks a lot of water but also urinates a lot. Had lack of appetite when she was sick and lost a little weight but has gained some back. Made good choices during the holidays. No issues with reflux.   Has LUQ pain if she overeats. Took some Nexium briefly and now not having any pain.   Blood  sugars have been little labile recently but not elevated greater than 300. Sees PCP next month.   No dysphagia.   Current Outpatient Medications  Medication Sig Dispense Refill   acetaminophen (TYLENOL) 500 MG tablet Take 1,000 mg by mouth every 6 (six) hours as needed for moderate pain or headache.     ALPHA LIPOIC ACID PO Take 1 capsule by mouth daily.      apixaban (ELIQUIS) 5 MG TABS tablet Take 1 tablet (5 mg total) by mouth 2 (two) times daily. 180 tablet 3   Ascorbic Acid (VITAMIN C) 1000 MG tablet Take 1,000 mg by mouth daily.     busPIRone (BUSPAR) 5 MG tablet Take 5 mg by mouth 2 (two) times daily.     calcium carbonate (TUMS EX) 750 MG chewable tablet Chew 1-2 tablets (750-1,500 mg total) by mouth at bedtime as needed for heartburn. 30 tablet 11   Calcium Carbonate-Vit D-Min (CALCIUM 600+D3 PLUS MINERALS) 600-800 MG-UNIT TABS Take 1 tablet by mouth daily.     cetirizine (ZYRTEC) 10 MG tablet Take 10 mg by mouth daily.     cholecalciferol (VITAMIN D3) 25 MCG (1000 UNIT) tablet Take 1,000 Units by mouth daily.     CINNAMON PO Take 2,400 mg by mouth at bedtime.     Coenzyme Q10 (CO Q 10) 100 MG CAPS Take 100 mg by mouth daily.     Cyanocobalamin (VITAMIN B 12) 500 MCG TABS Take by mouth.     DULoxetine (CYMBALTA) 30 MG capsule Take 30 mg by mouth daily.     ELDERBERRY PO Take 1 tablet by mouth daily.     ferrous sulfate 325 (65 FE) MG EC tablet Take 325 mg by mouth every other day.     fluticasone-salmeterol (WIXELA INHUB) 250-50 MCG/ACT AEPB Inhale 1 puff into the lungs in the morning and at bedtime.     FREESTYLE LITE test strip      glipiZIDE (GLUCOTROL) 5 MG tablet Take 5 mg by mouth daily.     Melatonin 10 MG TABS Take 10 mg by mouth at bedtime.     metFORMIN (GLUCOPHAGE) 500 MG tablet Take 500 mg by mouth daily.     metoprolol succinate (TOPROL-XL) 25 MG 24 hr tablet TAKE 1 TABLET DAILY (DOSE DECREASED 03/25/21) 90 tablet 3   Multiple Vitamin (MULTIVITAMIN WITH MINERALS) TABS tablet Take 1 tablet by mouth daily.     NON FORMULARY B Pap Machine     OXYGEN Inhale 2 L into the lungs at bedtime. With BPAP     pravastatin (PRAVACHOL) 20 MG tablet Take 1 tablet (20 mg total) daily by mouth. 90 tablet 3   PROAIR HFA 108 (90 Base) MCG/ACT inhaler Inhale 2 puffs into the lungs every 4 (four) hours as needed for wheezing or shortness of breath.      Probiotic Product (PROBIOTIC DAILY PO) Take 1 capsule by mouth daily.      Red Yeast Rice 600 MG TABS Take 1,200 mg by mouth daily.     triamcinolone cream (KENALOG) 0.1 % Apply 1 Application topically 2 (two) times daily as needed (itching).     Zinc 30 MG CAPS Take 30 mg by mouth daily.     No current facility-administered medications for this visit.    Past Medical History:  Diagnosis Date   Allergy    Anemia 07/25/2017   Anxiety    Arthritis    Atrial fibrillation [facility])    s/p ablation in March 2022, ILP  remains in place   Cataract    COPD (chronic obstructive pulmonary disease) ([place])    Depression    Diabetes mellitus without complication ([facility])    Emphysema of lung ([place])    GERD (gastroesophageal reflux disease) 10/28/2020   Hyperlipidemia    Hypertension    Oxygen deficiency    Sleep apnea    Typical atrial flutter ([place])     Past Surgical History:  Procedure Laterality Date   A-FLUTTER ABLATION N/A 10/04/2017   Procedure: A-FLUTTER ABLATION;  Surgeon: Thompson Grayer, MD;  Location: [place] [facility];  Service: Cardiovascular;  Laterality: N/A;   ABDOMINAL HYSTERECTOMY     fibroids   ATRIAL FIBRILLATION ABLATION N/A 12/09/2020   Procedure: ATRIAL FIBRILLATION ABLATION;  Surgeon: Thompson Grayer, MD;  Location: [place] [facility];  Service: Cardiovascular;  Laterality: N/A;   BIOPSY  03/20/2022   Procedure: BIOPSY;  Surgeon: Eloise Harman, DO;  [facility];  Service: Endoscopy;;   blood clot removal from base of brain  1990   CARDIOVERSION N/A 10/04/2020   Procedure: CARDIOVERSION;  Surgeon: Freada Bergeron, MD;  Location: [place];  Service: Cardiovascular;  Laterality: N/A;   CARPAL TUNNEL RELEASE     CATARACT EXTRACTION W/PHACO Left 06/16/2013   Procedure: CATARACT EXTRACTION PHACO AND INTRAOCULAR LENS PLACEMENT ([place]);  Surgeon: Tonny Branch, MD;  [facility];  Service: Ophthalmology;  Laterality: Left;  CDE:  12.18   CATARACT EXTRACTION  W/PHACO Right 06/26/2013   Procedure: CATARACT EXTRACTION PHACO AND INTRAOCULAR LENS PLACEMENT (I[facility]);  Surgeon: Tonny Branch, MD;  [facility]: [facility];  Service: Ophthalmology;  Laterality: Right;  CDE:14.71   CESAREAN SECTION     CHOLECYSTECTOMY     COLONOSCOPY WITH PROPOFOL N/A 04/08/2019   Dr.Fields: diverticulosis, 11 simple adenomas removed, hemorrhoids. next colonoscopy in 3 years.    COLONOSCOPY WITH PROPOFOL N/A 03/20/2022   Procedure: COLONOSCOPY WITH PROPOFOL;  Surgeon: Eloise Harman, DO;  [facility];  Service: Endoscopy;  Laterality: N/A;  9:45am   DILATION AND CURETTAGE OF UTERUS     ESOPHAGOGASTRODUODENOSCOPY (EGD) WITH PROPOFOL N/A 04/08/2019   Dr. Oneida Alar: H.pylori gastritis   ESOPHAGOGASTRODUODENOSCOPY (EGD) WITH PROPOFOL N/A 03/20/2022   Procedure: ESOPHAGOGASTRODUODENOSCOPY (EGD) WITH PROPOFOL;  Surgeon: Eloise Harman, DO;  [facility];  Service: Endoscopy;  Laterality: N/A;   EYE SURGERY  12/2016   FOOT NEUROMA SURGERY Right    LOOP RECORDER INSERTION N/A 11/06/2017   Procedure: LOOP RECORDER INSERTION;  Surgeon: Thompson Grayer, MD;  Location: [place] [facility];  Service: Cardiovascular;  Laterality: N/A;   MOUTH SURGERY     POLYPECTOMY  04/08/2019   Procedure: POLYPECTOMY;  Surgeon: Danie Binder, MD;  [facility];  Service: Endoscopy;;   POLYPECTOMY  03/20/2022   Procedure: POLYPECTOMY;  Surgeon: Eloise Harman, DO;  [facility];  Service: Endoscopy;;   TEE WITHOUT CARDIOVERSION N/A 12/09/2020   Procedure: TRANSESOPHAGEAL ECHOCARDIOGRAM (TEE);  Surgeon: Lelon Perla, MD;  Location: [facility] ENDOSCOPY;  Service: Cardiovascular;  Laterality: N/A;   TONSILLECTOMY      Family History  Problem Relation Age of Onset   Emphysema Mother    Alcohol abuse Mother    Arthritis Mother    COPD Mother    Depression Mother    Hyperlipidemia Mother    Hypertension Mother    Heart attack Father    Cancer Father        lung  Heart disease Father    Diabetes Brother    COPD Brother    Other Maternal Grandmother        swine flu 1919   Colon cancer Neg Hx    Colon polyps Neg Hx    Liver disease Neg Hx     Allergies as of 10/16/2022 - Review Complete 10/16/2022  Allergen Reaction Noted   Trazodone and nefazodone Hives and Other (See Comments) 01/10/2017   Benicar [olmesartan]  11/30/2020   Nickel Other (See Comments) 10/04/2017   Tetanus toxoids Other (See Comments) 06/28/2018   Adhesive [tape] Itching 06/09/2013   Bactrim [sulfamethoxazole-trimethoprim] Itching and Rash 06/09/2013   Penicillins Itching, Rash, and Other (See Comments) 06/09/2013   Ppd [tuberculin purified protein derivative] Swelling and Other (See Comments) 06/09/2013    Social History   Socioeconomic History   Marital status: Widowed    Spouse name: Not on file   Number of children: 1   Years of education: 37   Highest education level: Not on file  Occupational History   Occupation: retired    Comment: catering, Engineer, maintenance  Tobacco Use   Smoking status: Former    Packs/day: 1.50    Types: Cigarettes    Start date: 09/25/1958    Quit date: 06/06/2001    Years since quitting: 21.3   Smokeless tobacco: Never  Vaping Use   Vaping Use: Never used  Substance and Sexual Activity   Alcohol use: Not Currently    Comment: used to drink an occasional alcoholic beverage. No hyistory of heavy alcohol use.   Drug use: Never   Sexual activity: Not Currently  Other Topics Concern   Not on file  Social History Narrative   Lives alone   Son lives in [place]   TV, reads, puzzles   Social Determinants of Health   Financial Resource Strain: Not on file  Food Insecurity: Not on file  Transportation Needs: Not on file  Physical Activity: Not on file  Stress: Not on file  Social Connections: Not on file     Review of Systems   Gen: Denies fever, chills, anorexia. Denies fatigue, weakness, weight loss.  CV: Denies chest  pain, palpitations, syncope, peripheral edema, and claudication. Resp: Denies dyspnea at rest, cough, wheezing, coughing up blood, and pleurisy. GI: See HPI Derm: Denies rash, itching, dry skin Psych: Denies depression, anxiety, memory loss, confusion. No homicidal or suicidal ideation.  Heme: Denies bruising, bleeding, and enlarged lymph nodes.   Physical Exam   BP (!) 150/78 (BP Location: Right Arm, Patient Position: Sitting, Cuff Size: Normal)   Pulse 80   Temp 97.9 F (36.6 C) (Temporal)   Ht '5\' 2"'$  (1.575 m)   Wt 220 lb 12.8 oz (100.2 kg)   SpO2 96%   BMI 40.38 kg/m   General:   Alert and oriented. No distress noted. Pleasant and cooperative.  Head:  Normocephalic and atraumatic. Eyes:  Conjuctiva clear without scleral icterus. Mouth:  Oral mucosa pink and moist. Good dentition. No lesions. Lungs:  Clear to auscultation bilaterally. No wheezes, rales, or rhonchi. No distress.  Heart:  S1, S2 present without murmurs appreciated.  Abdomen:  +BS, soft, non-distended. Mild ttp to RUQ. No rebound or guarding. No HSM or masses noted. Rectal: deferred Msk:  Symmetrical without gross deformities. Normal posture. Extremities:  Without edema. Neurologic:  Alert and  oriented x4 Psych:  Alert and cooperative. Normal mood and affect.   Assessment  Linda [name] is a 77 y.o.  female with a history of colon polyps, IDA following with hematology maintained on oral iron and periodic IV iron, anxiety, depression, diabetes, COPD, GERD, HTN, HLD, sleep apnea, afib/aflutter s/p ablation in 2022, H. Pylori s/p eradication, and cirrhosis presenting today for follow up.   Cirrhosis: Noted on imaging in 2021.  History of chronically mildly elevated LFTs and thrombocytopenia.  Previous workup negative for autoimmune etiology and viral hepatitis.  Currently undergoing hepatitis A and hepatitis B vaccination.  Most recent lab work with MELD score 12 normal LFTs.  EGD in June 2023 with out evidence of  varices.  Right upper quadrant ultrasound in December without focal liver lesion.  Previous AFP within normal limits.  Denies any jaundice, pruritus, early satiety, ascites, weight loss, mental status change/confusion.  Has some occasional peripheral edema improved with walking or elevation.  Has been working on adhering to low-sodium diet.  Overall appears to be fairly well compensated.  Cirrhosis nutrition reinforced. Plan for RUQ [place] and Labs in 6 months along with office visit.   IDA: Chronic.  Chronically on Eliquis for A-fib and continues to follow with hematology.  Continue oral iron therapy.  EGD and TCS up-to-date.  Denies any melena or BRBPR.  Has had a couple of episodes of epistaxis since her last visit likely related to allergies and nightly oxygen therapy.  Currently asymptomatic from GI standpoint.  Denies any chest pain, shortness of breath.  GERD: Currently without any epigastric or LUQ pain.  Not currently on any PPI or H2 blocker.  Briefly tried some over-the-counter Nexium for couple weeks and has had no issues since.  Constipation: Has been struggling with some constipation intermittently since being sick in [place].  Continues to take daily probiotic.  Advised on daily fiber supplement and that she may use daily stool softener if needed.  If she continues to have constipation then start MiraLAX 17 g daily.  PLAN   MELD Labs - CBC, CMP, INR in June 2024 (NIC) RUQ [place] June 2024 (NIC) 2g sodium diet High protein, low fat diet Limit tylenol to '2000mg'$  daily Continue oral iron Continue daily probiotic Start metamucil daily.  May use stool softener daily as needed and if ongoing constipation then Miralax 17g daily.  Follow-up in 6 months    Venetia Night, MSN, FNP-BC, AGACNP-BC [facility]

## 2022-10-16 ENCOUNTER — Ambulatory Visit (INDEPENDENT_AMBULATORY_CARE_PROVIDER_SITE_OTHER): Payer: Medicare Other | Admitting: Gastroenterology

## 2022-10-16 ENCOUNTER — Encounter: Payer: Self-pay | Admitting: Gastroenterology

## 2022-10-16 VITALS — BP 150/78 | HR 80 | Temp 97.9°F | Ht 62.0 in | Wt 220.8 lb

## 2022-10-16 DIAGNOSIS — K5900 Constipation, unspecified: Secondary | ICD-10-CM

## 2022-10-16 DIAGNOSIS — D509 Iron deficiency anemia, unspecified: Secondary | ICD-10-CM

## 2022-10-16 DIAGNOSIS — K219 Gastro-esophageal reflux disease without esophagitis: Secondary | ICD-10-CM

## 2022-10-16 DIAGNOSIS — K7460 Unspecified cirrhosis of liver: Secondary | ICD-10-CM | POA: Diagnosis not present

## 2022-10-16 NOTE — Patient Instructions (Addendum)
We will plan to repeat your labs and ultrasound in late June/early July, just prior to your follow-up in July.  Nutrition/Education:  High-protein diet from a primarily plant-based diet. Avoid red meat.  No raw or undercooked meat, seafood, or shellfish. Low-fat/cholesterol/carbohydrate diet. Limit sodium to no more than 2000 mg/day including everything that you eat and drink. Recommend at least 30 minutes of aerobic and resistance exercise 3 days/week. Limit Tylenol to 2000 mg daily  Continue your iron daily.  Your last hemoglobin was within normal range.  Please make sure to follow-up with the pharmacist regarding your hepatitis A and hepatitis B vaccines when they are due.  For constipation: -Continue probiotic. -Start taking daily fiber supplement such as Metamucil or Benefiber. -If after starting fiber you continue to have harder stools or difficulty with constipation you may take an over-the-counter stool softener such as Colace (docusate sodium) or store brand alternative.  - If any difficulty remains after this you may start MiraLAX 1 capful daily or every other day and hold in the setting of diarrhea.  We will plan to see you in 6 months, or sooner if needed.  Please let hesitate to call the office if you experience any worsening symptoms or any new symptoms.  It was a pleasure to see you today. I want to create trusting relationships with patients. If you receive a survey regarding your visit,  I greatly appreciate you taking time to fill this out on paper or through your MyChart. I value your feedback.  Venetia Night, MSN, FNP-BC, AGACNP-BC [facility]

## 2022-11-16 DIAGNOSIS — J449 Chronic obstructive pulmonary disease, unspecified: Secondary | ICD-10-CM | POA: Diagnosis not present

## 2022-11-16 DIAGNOSIS — J9611 Chronic respiratory failure with hypoxia: Secondary | ICD-10-CM | POA: Diagnosis not present

## 2022-11-16 DIAGNOSIS — Z299 Encounter for prophylactic measures, unspecified: Secondary | ICD-10-CM | POA: Diagnosis not present

## 2022-11-16 DIAGNOSIS — E1165 Type 2 diabetes mellitus with hyperglycemia: Secondary | ICD-10-CM | POA: Diagnosis not present

## 2022-11-16 DIAGNOSIS — I10 Essential (primary) hypertension: Secondary | ICD-10-CM | POA: Diagnosis not present

## 2023-02-08 ENCOUNTER — Inpatient Hospital Stay: Payer: Medicare Other | Attending: Physician Assistant

## 2023-02-08 DIAGNOSIS — Z7901 Long term (current) use of anticoagulants: Secondary | ICD-10-CM | POA: Insufficient documentation

## 2023-02-08 DIAGNOSIS — D509 Iron deficiency anemia, unspecified: Secondary | ICD-10-CM | POA: Diagnosis not present

## 2023-02-08 DIAGNOSIS — R5383 Other fatigue: Secondary | ICD-10-CM | POA: Diagnosis not present

## 2023-02-08 DIAGNOSIS — D696 Thrombocytopenia, unspecified: Secondary | ICD-10-CM | POA: Diagnosis not present

## 2023-02-08 DIAGNOSIS — F32A Depression, unspecified: Secondary | ICD-10-CM | POA: Insufficient documentation

## 2023-02-08 DIAGNOSIS — Z801 Family history of malignant neoplasm of trachea, bronchus and lung: Secondary | ICD-10-CM | POA: Insufficient documentation

## 2023-02-08 DIAGNOSIS — R0602 Shortness of breath: Secondary | ICD-10-CM | POA: Diagnosis not present

## 2023-02-08 DIAGNOSIS — Z79899 Other long term (current) drug therapy: Secondary | ICD-10-CM | POA: Insufficient documentation

## 2023-02-08 DIAGNOSIS — Z87891 Personal history of nicotine dependence: Secondary | ICD-10-CM | POA: Diagnosis not present

## 2023-02-08 DIAGNOSIS — E538 Deficiency of other specified B group vitamins: Secondary | ICD-10-CM

## 2023-02-08 DIAGNOSIS — D500 Iron deficiency anemia secondary to blood loss (chronic): Secondary | ICD-10-CM

## 2023-02-08 DIAGNOSIS — K219 Gastro-esophageal reflux disease without esophagitis: Secondary | ICD-10-CM | POA: Insufficient documentation

## 2023-02-08 DIAGNOSIS — I4891 Unspecified atrial fibrillation: Secondary | ICD-10-CM | POA: Diagnosis not present

## 2023-02-08 DIAGNOSIS — R911 Solitary pulmonary nodule: Secondary | ICD-10-CM | POA: Diagnosis not present

## 2023-02-08 DIAGNOSIS — Z7951 Long term (current) use of inhaled steroids: Secondary | ICD-10-CM | POA: Diagnosis not present

## 2023-02-08 LAB — CBC WITH DIFFERENTIAL/PLATELET
Abs Immature Granulocytes: 0.02 10*3/uL (ref 0.00–0.07)
Basophils Absolute: 0 10*3/uL (ref 0.0–0.1)
Basophils Relative: 1 %
Eosinophils Absolute: 0.2 10*3/uL (ref 0.0–0.5)
Eosinophils Relative: 4 %
HCT: 42.9 % (ref 36.0–46.0)
Hemoglobin: 13.9 g/dL (ref 12.0–15.0)
Immature Granulocytes: 1 %
Lymphocytes Relative: 20 %
Lymphs Abs: 0.9 10*3/uL (ref 0.7–4.0)
MCH: 33.3 pg (ref 26.0–34.0)
MCHC: 32.4 g/dL (ref 30.0–36.0)
MCV: 102.6 fL — ABNORMAL HIGH (ref 80.0–100.0)
Monocytes Absolute: 0.3 10*3/uL (ref 0.1–1.0)
Monocytes Relative: 7 %
Neutro Abs: 3 10*3/uL (ref 1.7–7.7)
Neutrophils Relative %: 67 %
Platelets: 105 10*3/uL — ABNORMAL LOW (ref 150–400)
RBC: 4.18 MIL/uL (ref 3.87–5.11)
RDW: 13.7 % (ref 11.5–15.5)
WBC: 4.3 10*3/uL (ref 4.0–10.5)
nRBC: 0 % (ref 0.0–0.2)

## 2023-02-08 LAB — COMPREHENSIVE METABOLIC PANEL
ALT: 26 U/L (ref 0–44)
AST: 33 U/L (ref 15–41)
Albumin: 3.5 g/dL (ref 3.5–5.0)
Alkaline Phosphatase: 92 U/L (ref 38–126)
Anion gap: 5 (ref 5–15)
BUN: 18 mg/dL (ref 8–23)
CO2: 32 mmol/L (ref 22–32)
Calcium: 9.8 mg/dL (ref 8.9–10.3)
Chloride: 103 mmol/L (ref 98–111)
Creatinine, Ser: 0.79 mg/dL (ref 0.44–1.00)
GFR, Estimated: >60 mL/min (ref >60)
Glucose, Bld: 138 mg/dL — ABNORMAL HIGH (ref 70–99)
Potassium: 4.1 mmol/L (ref 3.5–5.1)
Sodium: 140 mmol/L (ref 135–145)
Total Bilirubin: 1.5 mg/dL — ABNORMAL HIGH (ref 0.3–1.2)
Total Protein: 6.7 g/dL (ref 6.5–8.1)

## 2023-02-08 LAB — IRON AND TIBC
Iron: 109 ug/dL (ref 28–170)
Saturation Ratios: 30 % (ref 10.4–31.8)
TIBC: 366 ug/dL (ref 250–450)
UIBC: 257 ug/dL

## 2023-02-08 LAB — FERRITIN: Ferritin: 39 ng/mL (ref 11–307)

## 2023-02-08 LAB — VITAMIN B12: Vitamin B-12: 2310 pg/mL — ABNORMAL HIGH (ref 180–914)

## 2023-02-11 LAB — METHYLMALONIC ACID, SERUM: Methylmalonic Acid, Quantitative: 189 nmol/L (ref 0–378)

## 2023-02-12 DIAGNOSIS — H3581 Retinal edema: Secondary | ICD-10-CM | POA: Diagnosis not present

## 2023-02-12 DIAGNOSIS — H35372 Puckering of macula, left eye: Secondary | ICD-10-CM | POA: Diagnosis not present

## 2023-02-13 NOTE — Progress Notes (Unsigned)
[facility] [address], [place] [address]   CLINIC:  Medical Oncology/Hematology  PCP:  Ignatius Specking, MD [address] [place] [address] [phone]   REASON FOR VISIT:  Follow-up for iron deficiency anemia and thrombocytopenia   CURRENT THERAPY: Intermittent IV iron  INTERVAL HISTORY:   [name] 77 y.o. female returns for routine follow-up of iron deficiency anemia and thrombocytopenia.  She was last seen by Rojelio Brenner PA-C on 08/10/2022.   She reports some worsening fatigue for the past several months.  She has chronic pica, reports that "she has always loved ice."   She denies any bright red blood per rectum, melena, or epistaxis.  She continues to bruise easily but denies any petechial rash.  She is on Eliquis for atrial fibrillation.  She has had some increased headaches recently.  She denies any restless legs, chest pain, lightheadedness, or syncope.  She has chronic shortness of breath with exertion, which is at baseline.  No B symptoms such as fever, chills, night sweats, or unintentional weight loss.  She remains on her iron tablet every other day.  She has little to no energy and 100% appetite. She endorses that she is maintaining a stable weight.  ASSESSMENT & PLAN:  1.  Iron deficiency anemia: - Colonoscopy on 04/08/2019 with normal ileum, moderate diverticulosis in the rectosigmoid colon, external and internal hemorrhoids.  Pathology consistent with tubular adenomas. - EGD (03/20/2022): Gastritis.  No esophageal varices. 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   - Continue iron tablet every other day. - CBC and iron panel with RTC in 6 months, or sooner if needed based on symptoms  - Continue follow-up with gastroenterology   2.  Thrombocytopenia - Mild thrombocytopenia since 2018 - CBC on 09/23/2020 did note some platelet clumping could be skewing the counts - CT scan of the chest on 01/05/2017 at [facility] showed borderline splenomegaly.  No comment on the liver. - Abdominal ultrasound (08/04/2021): Heterogeneous liver with slightly nodular contour suspicious for either diffuse hepatocellular disease or cirrhosis; mild splenomegaly 575 cc. - She follows with [facility] for cirrhosis - She admits to easy bruising but denies any petechial rash or abnormal bleeding events. - Most recent labs (02/08/2023): Platelets 105, WBC 4.3, normal differential.  Elevated B12 at 2310, normal methylmalonic acid.  (Folate normal when checked in May 2023) - She is taking vitamin B12 1000 mcg daily per her PCP.   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  May also have some element of ITP.  Less likely would be early myelodysplasia. - PLAN: No indication for treatment at this time - Recommend STOPPING vitamin B12 tablets for now - Continue to monitor with repeat CBC, B12, MMA at follow-up visit in 6 months to see if she needs to restart low dose B12 tablets at that time - If any significant deviation from baseline, would consider bone marrow  biopsy.   3.  Atrial fibrillation: - Continue Eliquis twice daily.    4.  Pulmonary nodule - Patient is following with PCP (Dr. Sherril Croon)  PLAN SUMMARY: >> IV Feraheme x2  >> Labs in 6 months (CBC/D, CMP, ferritin, iron/TIBC, B12, MMA) >> Office visit in 6 months, 1 week after labs     REVIEW OF  SYSTEMS:   Review of Systems  Constitutional:  Positive for fatigue. Negative for appetite change, chills, diaphoresis, fever and unexpected weight change.  HENT:   Negative for lump/mass and nosebleeds.   Eyes:  Negative for eye problems.  Respiratory:  Positive for cough (at times) and shortness of breath (with exertion). Negative for hemoptysis.   Cardiovascular:  Negative for chest pain, leg swelling and palpitations.  Gastrointestinal:  Negative for abdominal pain, blood in stool, constipation, diarrhea, nausea and vomiting.  Genitourinary:  Negative for hematuria.   Skin: Negative.   Neurological:  Positive for dizziness, headaches and numbness. Negative for light-headedness.  Hematological:  Does not bruise/bleed easily.  Psychiatric/Behavioral:  Positive for depression. The patient is nervous/anxious.      PHYSICAL EXAM:  ECOG PERFORMANCE STATUS: 1 - Symptomatic but completely ambulatory  There were no vitals filed for this visit. There were no vitals filed for this visit. Physical Exam Constitutional:      Appearance: Normal appearance. She is obese.  Cardiovascular:     Heart sounds: Normal heart sounds.  Pulmonary:     Breath sounds: Normal breath sounds.  Neurological:     General: No focal deficit present.     Mental Status: Mental status is at baseline.  Psychiatric:        Behavior: Behavior normal. Behavior is cooperative.     PAST MEDICAL/SURGICAL HISTORY:  Past Medical History:  Diagnosis Date   Allergy    Anemia 07/25/2017   Anxiety    Arthritis    Atrial fibrillation [facility])    s/p ablation in March 2022, ILP remains in place   Cataract    COPD (chronic obstructive pulmonary disease) (HCC)    Depression    Diabetes mellitus without complication (HCC)    Emphysema of lung (HCC)    GERD (gastroesophageal reflux disease) 10/28/2020   Hyperlipidemia    Hypertension    Oxygen deficiency    Sleep apnea    Typical atrial flutter (HCC)    Past  Surgical History:  Procedure Laterality Date   A-FLUTTER ABLATION N/A 10/04/2017   Procedure: A-FLUTTER ABLATION;  Surgeon: Hillis Range, MD;  Location: [facility] LAB;  Service: Cardiovascular;  Laterality: N/A;   ABDOMINAL HYSTERECTOMY     fibroids   ATRIAL FIBRILLATION ABLATION N/A 12/09/2020   Procedure: ATRIAL FIBRILLATION ABLATION;  Surgeon: Hillis Range, MD;  Location: [facility]VASIVE CV LAB;  Service: Cardiovascular;  Laterality: N/A;   BIOPSY  03/20/2022   Procedure: BIOPSY;  Surgeon: Lanelle Bal, DO;  Location: [facility] ENDO [facility];  Service: Endoscopy;;   blood clot removal from base of brain  1990   CARDIOVERSION N/A 10/04/2020   Procedure: CARDIOVERSION;  Surgeon: Meriam Sprague, MD;  Location: [facility] ENDOSCOPY;  Service: Cardiovascular;  Laterality: N/A;   CARPAL TUNNEL RELEASE     CATARACT EXTRACTION W/PHACO Left 06/16/2013   Procedure: CATARACT EXTRACTION PHACO AND INTRAOCULAR LENS PLACEMENT (IOC);  Surgeon: Gemma Payor, MD;  Location: [facility] ORS;  Service: Ophthalmology;  Laterality: Left;  CDE:  12.18   CATARACT EXTRACTION W/PHACO Right 06/26/2013   Procedure: CATARACT EXTRACTION PHACO AND INTRAOCULAR  LENS PLACEMENT (IOC);  Surgeon: Gemma Payor, MD;  Location: [facility] ORS;  Service: Ophthalmology;  Laterality: Right;  CDE:14.71   CESAREAN SECTION     CHOLECYSTECTOMY     COLONOSCOPY WITH PROPOFOL N/A 04/08/2019   Dr.Fields: diverticulosis, 11 simple adenomas removed, hemorrhoids. next colonoscopy in 3 years.    COLONOSCOPY WITH PROPOFOL N/A 03/20/2022   Procedure: COLONOSCOPY WITH PROPOFOL;  Surgeon: Lanelle Bal, DO;  Location: [facility];  Service: Endoscopy;  Laterality: N/A;  9:45am   DILATION AND CURETTAGE OF UTERUS     ESOPHAGOGASTRODUODENOSCOPY (EGD) WITH PROPOFOL N/A 04/08/2019   Dr. Darrick Penna: H.pylori gastritis   ESOPHAGOGASTRODUODENOSCOPY (EGD) WITH PROPOFOL N/A 03/20/2022   Procedure: ESOPHAGOGASTRODUODENOSCOPY (EGD) WITH PROPOFOL;  Surgeon: Lanelle Bal, DO;   [facility]: [facility];  Service: Endoscopy;  Laterality: N/A;   EYE SURGERY  12/2016   FOOT NEUROMA SURGERY Right    LOOP RECORDER INSERTION N/A 11/06/2017   Procedure: LOOP RECORDER INSERTION;  Surgeon: Hillis Range, MD;  Location: [facility];  Service: Cardiovascular;  Laterality: N/A;   MOUTH SURGERY     POLYPECTOMY  04/08/2019   Procedure: POLYPECTOMY;  Surgeon: West Bali, MD;  Location: [facility];  Service: Endoscopy;;   POLYPECTOMY  03/20/2022   Procedure: POLYPECTOMY;  Surgeon: Lanelle Bal, DO;  Location: [facility];  Service: Endoscopy;;   TEE WITHOUT CARDIOVERSION N/A 12/09/2020   Procedure: TRANSESOPHAGEAL ECHOCARDIOGRAM (TEE);  Surgeon: Lewayne Bunting, MD;  Location: [facility] ENDOSCOPY;  Service: Cardiovascular;  Laterality: N/A;   TONSILLECTOMY      SOCIAL HISTORY:  Social History   Socioeconomic History   Marital status: Widowed    Spouse name: Not on file   Number of children: 1   Years of education: 57   Highest education level: Not on file  Occupational History   Occupation: retired    Comment: catering, Production manager  Tobacco Use   Smoking status: Former    Packs/day: 1.5    Types: Cigarettes    Start date: 09/25/1958    Quit date: 06/06/2001    Years since quitting: 21.7   Smokeless tobacco: Never  Vaping Use   Vaping Use: Never used  Substance and Sexual Activity   Alcohol use: Not Currently    Comment: used to drink an occasional alcoholic beverage. No hyistory of heavy alcohol use.   Drug use: Never   Sexual activity: Not Currently  Other Topics Concern   Not on file  Social History Narrative   Lives alone   Son lives in [place]   TV, reads, puzzles   Social Determinants of Health   Financial Resource Strain: Not on file  Food Insecurity: Not on file  Transportation Needs: Not on file  Physical Activity: Not on file  Stress: Not on file  Social Connections: Not on file  Intimate Partner Violence: Not on file     FAMILY HISTORY:  Family History  Problem Relation Age of Onset   Emphysema Mother    Alcohol abuse Mother    Arthritis Mother    COPD Mother    Depression Mother    Hyperlipidemia Mother    Hypertension Mother    Heart attack Father    Cancer Father        lung   Heart disease Father    Diabetes Brother    COPD Brother    Other Maternal Grandmother        swine flu 1919   Colon  cancer Neg Hx    Colon polyps Neg Hx    Liver disease Neg Hx     CURRENT MEDICATIONS:  Outpatient Encounter Medications as of 02/14/2023  Medication Sig   acetaminophen (TYLENOL) 500 MG tablet Take 1,000 mg by mouth every 6 (six) hours as needed for moderate pain or headache.   ALPHA LIPOIC ACID PO Take 1 capsule by mouth daily.   apixaban (ELIQUIS) 5 MG TABS tablet Take 1 tablet (5 mg total) by mouth 2 (two) times daily.   Ascorbic Acid (VITAMIN C) 1000 MG tablet Take 1,000 mg by mouth daily.   busPIRone (BUSPAR) 5 MG tablet Take 5 mg by mouth 2 (two) times daily.   calcium carbonate (TUMS EX) 750 MG chewable tablet Chew 1-2 tablets (750-1,500 mg total) by mouth at bedtime as needed for heartburn.   Calcium Carbonate-Vit D-Min (CALCIUM 600+D3 PLUS MINERALS) 600-800 MG-UNIT TABS Take 1 tablet by mouth daily.   cetirizine (ZYRTEC) 10 MG tablet Take 10 mg by mouth daily.   cholecalciferol (VITAMIN D3) 25 MCG (1000 UNIT) tablet Take 1,000 Units by mouth daily.   CINNAMON PO Take 2,400 mg by mouth at bedtime.   Coenzyme Q10 (CO Q 10) 100 MG CAPS Take 100 mg by mouth daily.   Cyanocobalamin (VITAMIN B 12) 500 MCG TABS Take by mouth.   DULoxetine (CYMBALTA) 30 MG capsule Take 30 mg by mouth daily.   ELDERBERRY PO Take 1 tablet by mouth daily.   ferrous sulfate 325 (65 FE) MG EC tablet Take 325 mg by mouth every other day.   fluticasone-salmeterol (WIXELA INHUB) 250-50 MCG/ACT AEPB Inhale 1 puff into the lungs in the morning and at bedtime.   FREESTYLE LITE test strip    glipiZIDE (GLUCOTROL) 5 MG  tablet Take 5 mg by mouth daily.   Melatonin 10 MG TABS Take 10 mg by mouth at bedtime.   metFORMIN (GLUCOPHAGE) 500 MG tablet Take 500 mg by mouth daily.   metoprolol succinate (TOPROL-XL) 25 MG 24 hr tablet TAKE 1 TABLET DAILY (DOSE DECREASED 03/25/21)   Multiple Vitamin (MULTIVITAMIN WITH MINERALS) TABS tablet Take 1 tablet by mouth daily.   NON FORMULARY B Pap Machine   OXYGEN Inhale 2 L into the lungs at bedtime. With BPAP   pravastatin (PRAVACHOL) 20 MG tablet Take 1 tablet (20 mg total) daily by mouth.   PROAIR HFA 108 (90 Base) MCG/ACT inhaler Inhale 2 puffs into the lungs every 4 (four) hours as needed for wheezing or shortness of breath.   Probiotic Product (PROBIOTIC DAILY PO) Take 1 capsule by mouth daily.    Red Yeast Rice 600 MG TABS Take 1,200 mg by mouth daily.   triamcinolone cream (KENALOG) 0.1 % Apply 1 Application topically 2 (two) times daily as needed (itching).   Zinc 30 MG CAPS Take 30 mg by mouth daily.   No facility-administered encounter medications on file as of 02/14/2023.    ALLERGIES:  Allergies  Allergen Reactions   Trazodone And Nefazodone Hives and Other (See Comments)    "blisters my skin"    Benicar [Olmesartan]     Pt does not remember    Nickel Other (See Comments)    "oozing"   Tetanus Toxoids Other (See Comments)    Left a knot   Adhesive [Tape] Itching   Bactrim [Sulfamethoxazole-Trimethoprim] Itching and Rash   Penicillins Itching, Rash and Other (See Comments)    Has patient had a PCN reaction causing immediate rash, facial/tongue/throat swelling, SOB or  lightheadedness with hypotension: Unknown Has patient had a PCN reaction causing severe rash involving mucus membranes or skin necrosis: Unknown Has patient had a PCN reaction that required hospitalization: No Has patient had a PCN reaction occurring within the last 10 years: No If all of the above answers are "NO", then may proceed with Cephalosporin use   Ppd [Tuberculin Purified Protein  Derivative] Swelling and Other (See Comments)    Local arm swelling    LABORATORY DATA:  I have reviewed the labs as listed.  CBC    Component Value Date/Time   WBC 4.3 02/08/2023 0847   RBC 4.18 02/08/2023 0847   HGB 13.9 02/08/2023 0847   HGB 13.6 11/30/2020 0930   HCT 42.9 02/08/2023 0847   HCT 41.5 11/30/2020 0930   PLT 105 (L) 02/08/2023 0847   PLT 129 (L) 11/30/2020 0930   MCV 102.6 (H) 02/08/2023 0847   MCV 99 (H) 11/30/2020 0930   MCH 33.3 02/08/2023 0847   MCHC 32.4 02/08/2023 0847   RDW 13.7 02/08/2023 0847   RDW 13.3 11/30/2020 0930   LYMPHSABS 0.9 02/08/2023 0847   LYMPHSABS 1.0 11/30/2020 0930   MONOABS 0.3 02/08/2023 0847   EOSABS 0.2 02/08/2023 0847   EOSABS 0.1 11/30/2020 0930   BASOSABS 0.0 02/08/2023 0847   BASOSABS 0.0 11/30/2020 0930      Latest Ref Rng & Units 02/08/2023    8:47 AM 09/04/2022    8:58 AM 03/16/2022    9:44 AM  CMP  Glucose 70 - 99 mg/dL 540  981  191   BUN 8 - 23 mg/dL 18  16  20    Creatinine 0.44 - 1.00 mg/dL 4.78  2.95  6.21   Sodium 135 - 145 mmol/L 140  140  141   Potassium 3.5 - 5.1 mmol/L 4.1  3.7  3.8   Chloride 98 - 111 mmol/L 103  107  104   CO2 22 - 32 mmol/L 32  27  33   Calcium 8.9 - 10.3 mg/dL 9.8  9.4  9.6   Total Protein 6.5 - 8.1 g/dL 6.7  6.6  6.4   Total Bilirubin 0.3 - 1.2 mg/dL 1.5  1.3  1.5   Alkaline Phos 38 - 126 U/L 92  75  90   AST 15 - 41 U/L 33  44  31   ALT 0 - 44 U/L 26  27  32     DIAGNOSTIC IMAGING:  I have independently reviewed the relevant imaging and discussed with the patient.   WRAP UP:  All questions were answered. The patient knows to call the clinic with any problems, questions or concerns.  Medical decision making: Moderate  Time spent on visit: I spent 20 minutes counseling the patient face to face. The total time spent in the appointment was 30 minutes and more than 50% was on counseling.  Carnella Guadalajara, PA-C  02/14/23 2:01 PM

## 2023-02-14 ENCOUNTER — Inpatient Hospital Stay (HOSPITAL_BASED_OUTPATIENT_CLINIC_OR_DEPARTMENT_OTHER): Payer: Medicare Other | Admitting: Physician Assistant

## 2023-02-14 ENCOUNTER — Other Ambulatory Visit: Payer: Self-pay

## 2023-02-14 VITALS — BP 149/64 | HR 67 | Temp 97.6°F | Resp 17 | Ht 62.0 in | Wt 222.2 lb

## 2023-02-14 DIAGNOSIS — D696 Thrombocytopenia, unspecified: Secondary | ICD-10-CM | POA: Diagnosis not present

## 2023-02-14 DIAGNOSIS — D500 Iron deficiency anemia secondary to blood loss (chronic): Secondary | ICD-10-CM

## 2023-02-14 DIAGNOSIS — D509 Iron deficiency anemia, unspecified: Secondary | ICD-10-CM

## 2023-02-14 DIAGNOSIS — I4891 Unspecified atrial fibrillation: Secondary | ICD-10-CM | POA: Diagnosis not present

## 2023-02-14 DIAGNOSIS — Z7901 Long term (current) use of anticoagulants: Secondary | ICD-10-CM | POA: Diagnosis not present

## 2023-02-14 DIAGNOSIS — E538 Deficiency of other specified B group vitamins: Secondary | ICD-10-CM

## 2023-02-14 DIAGNOSIS — R5383 Other fatigue: Secondary | ICD-10-CM | POA: Diagnosis not present

## 2023-02-14 DIAGNOSIS — R911 Solitary pulmonary nodule: Secondary | ICD-10-CM | POA: Diagnosis not present

## 2023-02-14 NOTE — Patient Instructions (Signed)
[name] at [name]charge Instructions  You were seen today by Linda Brenner PA-C for your iron deficiency anemia and low platelets.  Your blood levels look great, but your iron levels are low.  This is most likely why you feel so tired.  We will schedule you for IV iron x 2 doses.  Your B12 levels are too high.  STOP taking your B12 supplement until we recheck your levels at your next visit.  Your platelet levels remain somewhat low, which I believe is related to your liver disease.  You do not need treatment at this time, but we will continue to monitor these levels going forward.  FOLLOW-UP APPOINTMENT: Labs and office visit in 6 months  ** Thank you for trusting me with your healthcare!  I strive to provide all of my patients with quality care at each visit.  If you receive a survey for this visit, I would be so grateful to you for taking the time to provide feedback.  Thank you in advance!  ~ [name]                   Dr. Doreatha [name]   &   Linda Brenner, PA-C   - - - - - - - - - - - - - - - - - -     Thank you for choosing [name] at [name] to provide your oncology and hematology care.  To afford each patient quality time with our provider, please arrive at least 15 minutes before your scheduled appointment time.   If you have a lab appointment with the [name] please come in thru the Main Entrance and check in at the main information desk.  You need to re-schedule your appointment should you arrive 10 or more minutes late.  We strive to give you quality time with our providers, and arriving late affects you and other patients whose appointments are after yours.  Also, if you no show three or more times for appointments you may be dismissed from the clinic at the providers discretion.     Again, thank you for choosing [name].  Our hope is that these requests will decrease the  amount of time that you wait before being seen by our physicians.       _____________________________________________________________  Should you have questions after your visit to [name], please contact our office at [phone] and follow the prompts.  Our office hours are 8:00 a.m. and 4:30 p.m. Monday - Friday.  Please note that voicemails left after 4:00 p.m. may not be returned until the following business day.  We are closed weekends and major holidays.  You do have access to a nurse 24-7, just call the main number to the clinic [phone] and do not press any options, hold on the line and a nurse will answer the phone.    For prescription refill requests, have your pharmacy contact our office and allow 72 hours.    Due to Covid, you will need to wear a mask upon entering the hospital. If you do not have a mask, a mask will be given to you at the Main Entrance upon arrival. For doctor visits, patients may have 1 support person age 58 or older with them. For treatment visits, patients can not have anyone with them due to social distancing guidelines and our immunocompromised population.

## 2023-02-15 ENCOUNTER — Ambulatory Visit: Payer: Medicare Other | Admitting: Physician Assistant

## 2023-02-21 DIAGNOSIS — J9611 Chronic respiratory failure with hypoxia: Secondary | ICD-10-CM | POA: Diagnosis not present

## 2023-02-21 DIAGNOSIS — E1165 Type 2 diabetes mellitus with hyperglycemia: Secondary | ICD-10-CM | POA: Diagnosis not present

## 2023-02-21 DIAGNOSIS — Z299 Encounter for prophylactic measures, unspecified: Secondary | ICD-10-CM | POA: Diagnosis not present

## 2023-02-21 DIAGNOSIS — I10 Essential (primary) hypertension: Secondary | ICD-10-CM | POA: Diagnosis not present

## 2023-02-21 DIAGNOSIS — K7460 Unspecified cirrhosis of liver: Secondary | ICD-10-CM | POA: Diagnosis not present

## 2023-02-23 ENCOUNTER — Inpatient Hospital Stay: Payer: Medicare Other

## 2023-02-23 VITALS — BP 156/67 | HR 64 | Temp 96.4°F | Resp 18

## 2023-02-23 DIAGNOSIS — Z7901 Long term (current) use of anticoagulants: Secondary | ICD-10-CM | POA: Diagnosis not present

## 2023-02-23 DIAGNOSIS — I4891 Unspecified atrial fibrillation: Secondary | ICD-10-CM | POA: Diagnosis not present

## 2023-02-23 DIAGNOSIS — R911 Solitary pulmonary nodule: Secondary | ICD-10-CM | POA: Diagnosis not present

## 2023-02-23 DIAGNOSIS — D509 Iron deficiency anemia, unspecified: Secondary | ICD-10-CM

## 2023-02-23 DIAGNOSIS — R5383 Other fatigue: Secondary | ICD-10-CM | POA: Diagnosis not present

## 2023-02-23 DIAGNOSIS — D696 Thrombocytopenia, unspecified: Secondary | ICD-10-CM | POA: Diagnosis not present

## 2023-02-23 MED ORDER — CETIRIZINE HCL 10 MG PO TABS
10.0000 mg | ORAL_TABLET | Freq: Once | ORAL | Status: AC
  Administered 2023-02-23: 10 mg via ORAL
  Filled 2023-02-23: qty 1

## 2023-02-23 MED ORDER — SODIUM CHLORIDE 0.9 % IV SOLN
510.0000 mg | Freq: Once | INTRAVENOUS | Status: AC
  Administered 2023-02-23: 510 mg via INTRAVENOUS
  Filled 2023-02-23: qty 510

## 2023-02-23 MED ORDER — SODIUM CHLORIDE 0.9 % IV SOLN: Freq: Once | INTRAVENOUS | Status: AC

## 2023-02-23 NOTE — Progress Notes (Signed)
Patient tolerated treatment well with no complaints voiced.  Patient left ambulatory in stable condition.  Vital signs stable at discharge.  Follow up as scheduled.    

## 2023-02-23 NOTE — Patient Instructions (Signed)
[facility]   Discharge Instructions: Thank you for choosing [facility] to provide your oncology and hematology care.  If you have a lab appointment with the Cancer Center - please note that after April 8th, 2024, all labs will be drawn in the cancer center.  You do not have to check in or register with the main entrance as you have in the past but will complete your check-in in the cancer center.  Wear comfortable clothing and clothing appropriate for easy access to any Portacath or PICC line.   We strive to give you quality time with your provider. You may need to reschedule your appointment if you arrive late (15 or more minutes).  Arriving late affects you and other patients whose appointments are after yours.  Also, if you miss three or more appointments without notifying the office, you may be dismissed from the clinic at the provider's discretion.      For prescription refill requests, have your pharmacy contact our office and allow 72 hours for refills to be completed.    Today you received the following Feraheme.  Ferumoxytol Injection What is this medication? FERUMOXYTOL (FER ue MOX i tol) treats low levels of iron in your body (iron deficiency anemia). Iron is a mineral that plays an important role in making red blood cells, which carry oxygen from your lungs to the rest of your body. This medicine may be used for other purposes; ask your health care provider or pharmacist if you have questions. COMMON BRAND NAME(S): Feraheme What should I tell my care team before I take this medication? They need to know if you have any of these conditions: Anemia not caused by low iron levels High levels of iron in the blood Magnetic resonance imaging (MRI) test scheduled An unusual or allergic reaction to iron, other medications, foods, dyes, or preservatives Pregnant or trying to get pregnant Breastfeeding How should I use this medication? This medication  is injected into a vein. It is given by your care team in a hospital or clinic setting. Talk to your care team the use of this medication in children. Special care may be needed. Overdosage: If you think you have taken too much of this medicine contact a poison control center or emergency room at once. NOTE: This medicine is only for you. Do not share this medicine with others. What if I miss a dose? It is important not to miss your dose. Call your care team if you are unable to keep an appointment. What may interact with this medication? Other iron products This list may not describe all possible interactions. Give your health care provider a list of all the medicines, herbs, non-prescription drugs, or dietary supplements you use. Also tell them if you smoke, drink alcohol, or use illegal drugs. Some items may interact with your medicine. What should I watch for while using this medication? Visit your care team regularly. Tell your care team if your symptoms do not start to get better or if they get worse. You may need blood work done while you are taking this medication. You may need to follow a special diet. Talk to your care team. Foods that contain iron include: whole grains/cereals, dried fruits, beans, or peas, leafy [place] vegetables, and organ meats (liver, kidney). What side effects may I notice from receiving this medication? Side effects that you should report to your care team as soon as possible: Allergic reactions--skin rash, itching, hives, swelling of the face,   lips, tongue, or throat Low blood pressure--dizziness, feeling faint or lightheaded, blurry vision Shortness of breath Side effects that usually do not require medical attention (report to your care team if they continue or are bothersome): Flushing Headache Joint pain Muscle pain Nausea Pain, redness, or irritation at injection site This list may not describe all possible side effects. Call your doctor for medical  advice about side effects. You may report side effects to FDA at 1-800-FDA-1088. Where should I keep my medication? This medication is given in a hospital or clinic and will not be stored at home. NOTE: This sheet is a summary. It may not cover all possible information. If you have questions about this medicine, talk to your doctor, pharmacist, or health care provider.  2024 Elsevier/Gold Standard (2022-03-20 00:00:00)    To help prevent nausea and vomiting after your treatment, we encourage you to take your nausea medication as directed.  BELOW ARE SYMPTOMS THAT SHOULD BE REPORTED IMMEDIATELY: *FEVER GREATER THAN 100.4 F (38 C) OR HIGHER *CHILLS OR SWEATING *NAUSEA AND VOMITING THAT IS NOT CONTROLLED WITH YOUR NAUSEA MEDICATION *UNUSUAL SHORTNESS OF BREATH *UNUSUAL BRUISING OR BLEEDING *URINARY PROBLEMS (pain or burning when urinating, or frequent urination) *BOWEL PROBLEMS (unusual diarrhea, constipation, pain near the anus) TENDERNESS IN MOUTH AND THROAT WITH OR WITHOUT PRESENCE OF ULCERS (sore throat, sores in mouth, or a toothache) UNUSUAL RASH, SWELLING OR PAIN  UNUSUAL VAGINAL DISCHARGE OR ITCHING   Items with * indicate a potential emergency and should be followed up as soon as possible or go to the Emergency Department if any problems should occur.  Please show the CHEMOTHERAPY ALERT CARD or IMMUNOTHERAPY ALERT CARD at check-in to the Emergency Department and triage nurse.  Should you have questions after your visit or need to cancel or reschedule your appointment, please contact [facility] [phone]  and follow the prompts.  Office hours are 8:00 a.m. to 4:30 p.m. Monday - Friday. Please note that voicemails left after 4:00 p.m. may not be returned until the following business day.  We are closed weekends and major holidays. You have access to a nurse at all times for urgent questions. Please call the main number to the clinic [phone] and follow the  prompts.  For any non-urgent questions, you may also contact your provider using MyChart. We now offer e-Visits for anyone 18 and older to request care online for non-urgent symptoms. For details visit [email or web address].   Also download the MyChart app! Go to the app store, search "MyChart", open the app, select [facility], and log in with your MyChart username and password.   

## 2023-03-01 ENCOUNTER — Encounter: Payer: Self-pay | Admitting: Gastroenterology

## 2023-03-02 ENCOUNTER — Other Ambulatory Visit: Payer: Self-pay | Admitting: *Deleted

## 2023-03-02 DIAGNOSIS — K7460 Unspecified cirrhosis of liver: Secondary | ICD-10-CM

## 2023-03-02 DIAGNOSIS — D509 Iron deficiency anemia, unspecified: Secondary | ICD-10-CM

## 2023-03-05 ENCOUNTER — Inpatient Hospital Stay: Payer: Medicare Other | Attending: Physician Assistant

## 2023-03-05 VITALS — BP 135/70 | HR 64 | Temp 96.7°F | Resp 20

## 2023-03-05 DIAGNOSIS — D509 Iron deficiency anemia, unspecified: Secondary | ICD-10-CM | POA: Diagnosis not present

## 2023-03-05 MED ORDER — SODIUM CHLORIDE 0.9 % IV SOLN: Freq: Once | INTRAVENOUS | Status: AC

## 2023-03-05 MED ORDER — SODIUM CHLORIDE 0.9 % IV SOLN
510.0000 mg | Freq: Once | INTRAVENOUS | Status: AC
  Administered 2023-03-05: 510 mg via INTRAVENOUS
  Filled 2023-03-05: qty 17

## 2023-03-05 MED ORDER — CETIRIZINE HCL 10 MG PO TABS
10.0000 mg | ORAL_TABLET | Freq: Once | ORAL | Status: AC
  Administered 2023-03-05: 10 mg via ORAL
  Filled 2023-03-05: qty 1

## 2023-03-05 NOTE — Patient Instructions (Signed)

## 2023-03-05 NOTE — Progress Notes (Signed)
Patient declined to stay for post infusion observation. Discharged in stable condition. Ambulatory to lobby.

## 2023-03-15 ENCOUNTER — Other Ambulatory Visit (HOSPITAL_COMMUNITY)
Admission: RE | Admit: 2023-03-15 | Discharge: 2023-03-15 | Disposition: A | Discharge status: Home / Self Care | Payer: Medicare Other | Source: Ambulatory Visit | Attending: Gastroenterology | Admitting: Gastroenterology

## 2023-03-15 DIAGNOSIS — D509 Iron deficiency anemia, unspecified: Secondary | ICD-10-CM | POA: Insufficient documentation

## 2023-03-15 DIAGNOSIS — K7460 Unspecified cirrhosis of liver: Secondary | ICD-10-CM | POA: Insufficient documentation

## 2023-03-15 LAB — CBC WITH DIFFERENTIAL/PLATELET
Abs Immature Granulocytes: 0 10*3/uL (ref 0.00–0.07)
Basophils Absolute: 0 10*3/uL (ref 0.0–0.1)
Basophils Relative: 0 %
Eosinophils Absolute: 0.1 10*3/uL (ref 0.0–0.5)
Eosinophils Relative: 2 %
HCT: 38.4 % (ref 36.0–46.0)
Hemoglobin: 12.6 g/dL (ref 12.0–15.0)
Immature Granulocytes: 0 %
Lymphocytes Relative: 19 %
Lymphs Abs: 0.8 10*3/uL (ref 0.7–4.0)
MCH: 34.4 pg — ABNORMAL HIGH (ref 26.0–34.0)
MCHC: 32.8 g/dL (ref 30.0–36.0)
MCV: 104.9 fL — ABNORMAL HIGH (ref 80.0–100.0)
Monocytes Absolute: 0.3 10*3/uL (ref 0.1–1.0)
Monocytes Relative: 7 %
Neutro Abs: 3 10*3/uL (ref 1.7–7.7)
Neutrophils Relative %: 72 %
Platelets: 89 10*3/uL — ABNORMAL LOW (ref 150–400)
RBC: 3.66 MIL/uL — ABNORMAL LOW (ref 3.87–5.11)
RDW: 15.9 % — ABNORMAL HIGH (ref 11.5–15.5)
WBC: 4.2 10*3/uL (ref 4.0–10.5)
nRBC: 0 % (ref 0.0–0.2)

## 2023-03-15 LAB — COMPREHENSIVE METABOLIC PANEL
ALT: 29 U/L (ref 0–44)
AST: 40 U/L (ref 15–41)
Albumin: 3.3 g/dL — ABNORMAL LOW (ref 3.5–5.0)
Alkaline Phosphatase: 92 U/L (ref 38–126)
Anion gap: 8 (ref 5–15)
BUN: 17 mg/dL (ref 8–23)
CO2: 27 mmol/L (ref 22–32)
Calcium: 9.1 mg/dL (ref 8.9–10.3)
Chloride: 102 mmol/L (ref 98–111)
Creatinine, Ser: 0.86 mg/dL (ref 0.44–1.00)
GFR, Estimated: >60 mL/min (ref >60)
Glucose, Bld: 201 mg/dL — ABNORMAL HIGH (ref 70–99)
Potassium: 4.1 mmol/L (ref 3.5–5.1)
Sodium: 137 mmol/L (ref 135–145)
Total Bilirubin: 1.9 mg/dL — ABNORMAL HIGH (ref 0.3–1.2)
Total Protein: 6.3 g/dL — ABNORMAL LOW (ref 6.5–8.1)

## 2023-03-15 LAB — PROTIME-INR
INR: 1.2 (ref 0.8–1.2)
Prothrombin Time: 15.6 seconds — ABNORMAL HIGH (ref 11.4–15.2)

## 2023-03-16 ENCOUNTER — Other Ambulatory Visit: Payer: Self-pay | Admitting: *Deleted

## 2023-03-16 DIAGNOSIS — K7460 Unspecified cirrhosis of liver: Secondary | ICD-10-CM

## 2023-03-21 ENCOUNTER — Other Ambulatory Visit (HOSPITAL_COMMUNITY): Payer: Self-pay | Admitting: Internal Medicine

## 2023-03-21 DIAGNOSIS — Z1231 Encounter for screening mammogram for malignant neoplasm of breast: Secondary | ICD-10-CM

## 2023-04-05 ENCOUNTER — Telehealth: Payer: Self-pay | Admitting: *Deleted

## 2023-04-05 ENCOUNTER — Encounter: Payer: Self-pay | Admitting: Gastroenterology

## 2023-04-05 ENCOUNTER — Ambulatory Visit (INDEPENDENT_AMBULATORY_CARE_PROVIDER_SITE_OTHER): Payer: Medicare Other | Admitting: Gastroenterology

## 2023-04-05 VITALS — BP 135/79 | HR 65 | Temp 98.0°F | Ht 62.5 in | Wt 222.0 lb

## 2023-04-05 DIAGNOSIS — K219 Gastro-esophageal reflux disease without esophagitis: Secondary | ICD-10-CM

## 2023-04-05 DIAGNOSIS — D509 Iron deficiency anemia, unspecified: Secondary | ICD-10-CM | POA: Diagnosis not present

## 2023-04-05 DIAGNOSIS — K7460 Unspecified cirrhosis of liver: Secondary | ICD-10-CM | POA: Diagnosis not present

## 2023-04-05 DIAGNOSIS — K5900 Constipation, unspecified: Secondary | ICD-10-CM | POA: Diagnosis not present

## 2023-04-05 NOTE — Telephone Encounter (Signed)
Spoke with pt and she is aware of [place] appt details. She voiced understanding.

## 2023-04-05 NOTE — Progress Notes (Signed)
GI Office Note    Referring Provider: Ignatius Specking, MD Primary Care Physician:  Ignatius Specking, MD Primary Gastroenterologist: Hennie Duos. Marletta Lor, DO  Date:  04/05/2023  ID:  Linda [name], DOB May 19, 1946, MRN [id number]   Chief Complaint   Chief Complaint  Patient presents with   Follow-up    Has been having some left side pain for going on a month   History of Present Illness  Linda [name] is a 77 y.o. female with a history of colon polyps, IDA following with hematology maintained on oral iron and periodic IV iron, anxiety, depression, diabetes, COPD, GERD, HTN, HLD, sleep apnea, afib/aflutter s/p ablation in 2022, H. Pylori s/p eradication, and cirrhosis presenting today for follow up.  Previous cirrhosis workup with normal immunoglobulins, ANA, AMA, Alpha-1-antitrypsin, AFP. Celiac screen negative. Hep B core antibody non-reactive.    Colonoscopy June 2023 -internal hemorrhoids -sigmoid diverticulosis -two transverse polyps -two rectosigmoid polyps -biopsy revealed as tubular adenomas -Repeat in 5 years   EGD June 2023: -gastritis s/p biopsy (hyperemia) -normal duodenum -no varices -repeat in 3 years   RUQ [place] 09/08/22: -CBD 9mm s/p cholecystectomy -no focal liver lesion -normal doppler -cirrhotic morphology   Last office visit 10/16/22.  MELD score 12 from last December.  Working on getting hepatitis a and B vaccinations.  Reports nosebleeds related to oxygen therapy but otherwise no bleeding.  Having about 1 bowel movement daily.  Having mild peripheral edema and abdominal distention.  Was recently sick before Thanksgiving.  After imaging completed in December 2023.  Only has left upper quadrant pain if she overeats.  No longer taking Nexium.  No dysphagia.  Today: Cirrhosis history Hematemesis/coffee ground emesis: none History of variceal bleeding: none Abdominal pain: left upper abdominal pain Abdominal distention/worsening ascites: none Fever/chills:  None Episodes of confusion/disorientation: none Number of daily bowel movements: 1 mostly everyday. (Eating fiber/salads). Tries to eat veggies when she goes out. Taking diuretics?: none Date of last EGD: 03/20/22 Prior history of banding?: none Prior episodes of SBP: no Last time liver imaging was performed:December 2023  Hepatitis A and B vaccination status: Thinks she finished her series.   MELD 3.0: 12 at 03/15/2023  9:37 AM MELD-Na: 11 at 03/15/2023  9:37 AM Calculated from: Serum Creatinine: 0.86 mg/dL (Using min of 1 mg/dL) at 01/01/8118  1:47 AM Serum Sodium: 137 mmol/L at 03/15/2023  9:37 AM Total Bilirubin: 1.9 mg/dL at 05/23/5620  3:08 AM Serum Albumin: 3.3 g/dL at 6/57/8469  6:29 AM INR(ratio): 1.2 at 03/15/2023  9:37 AM Age at listing (hypothetical): 76 years Sex: Female at 03/15/2023  9:37 AM  Likes to crochet. Goes to classes and learns how to do it. It allows socialization.   Has been having some mild left side upper abdominal pain/catch along the rib cage. Pain comes and goes and does not think it relates to meal time. Symptoms going on for about a month. Nothing makes it better or worse. It lasts a few minutes at a time. No N/V. No melena or brbpr.   Allergies have been affecting her. Has had some post nasal gtt.   States she has cut back on her sugar intake and blood sugars are improved.   Had a large knot come up from blood draw to her right elbow.   Current Outpatient Medications  Medication Sig Dispense Refill   acetaminophen (TYLENOL) 500 MG tablet Take 1,000 mg by mouth every 6 (six) hours as needed for moderate pain or headache.  ALPHA LIPOIC ACID PO Take 1 capsule by mouth daily.     apixaban (ELIQUIS) 5 MG TABS tablet Take 1 tablet (5 mg total) by mouth 2 (two) times daily. 180 tablet 3   Ascorbic Acid (VITAMIN C) 1000 MG tablet Take 1,000 mg by mouth daily.     busPIRone (BUSPAR) 5 MG tablet Take 5 mg by mouth 2 (two) times daily.     Calcium  Carbonate-Vit D-Min (CALCIUM 600+D3 PLUS MINERALS) 600-800 MG-UNIT TABS Take 1 tablet by mouth daily.     cetirizine (ZYRTEC) 10 MG tablet Take 10 mg by mouth daily.     cholecalciferol (VITAMIN D3) 25 MCG (1000 UNIT) tablet Take 1,000 Units by mouth daily.     CINNAMON PO Take 2,400 mg by mouth at bedtime.     DULoxetine (CYMBALTA) 30 MG capsule Take 30 mg by mouth daily.     ferrous sulfate 325 (65 FE) MG EC tablet Take 325 mg by mouth every other day.     fluticasone-salmeterol (WIXELA INHUB) 250-50 MCG/ACT AEPB Inhale 1 puff into the lungs in the morning and at bedtime.     FREESTYLE LITE test strip      glipiZIDE (GLUCOTROL) 5 MG tablet Take 5 mg by mouth daily.     Melatonin 10 MG TABS Take 10 mg by mouth at bedtime.     metFORMIN (GLUCOPHAGE) 500 MG tablet Take 500 mg by mouth daily.     metoprolol succinate (TOPROL-XL) 25 MG 24 hr tablet TAKE 1 TABLET DAILY (DOSE DECREASED 03/25/21) 90 tablet 3   Multiple Vitamin (MULTIVITAMIN WITH MINERALS) TABS tablet Take 1 tablet by mouth daily.     NON FORMULARY B Pap Machine     OXYGEN Inhale 2 L into the lungs at bedtime. With BPAP     pravastatin (PRAVACHOL) 20 MG tablet Take 1 tablet (20 mg total) daily by mouth. 90 tablet 3   PROAIR HFA 108 (90 Base) MCG/ACT inhaler Inhale 2 puffs into the lungs every 4 (four) hours as needed for wheezing or shortness of breath.     Probiotic Product (PROBIOTIC DAILY PO) Take 1 capsule by mouth daily.      Red Yeast Rice 600 MG TABS Take 1,200 mg by mouth daily.     Zinc 30 MG CAPS Take 30 mg by mouth daily.     No current facility-administered medications for this visit.    Past Medical History:  Diagnosis Date   Allergy    Anemia 07/25/2017   Anxiety    Arthritis    Atrial fibrillation [facility])    s/p ablation in March 2022, ILP remains in place   Cataract    COPD (chronic obstructive pulmonary disease) (HCC)    Depression    Diabetes mellitus without complication (HCC)    Emphysema of lung (HCC)     GERD (gastroesophageal reflux disease) 10/28/2020   Hyperlipidemia    Hypertension    Oxygen deficiency    Sleep apnea    Typical atrial flutter (HCC)     Past Surgical History:  Procedure Laterality Date   A-FLUTTER ABLATION N/A 10/04/2017   Procedure: A-FLUTTER ABLATION;  Surgeon: Hillis Range, MD;  Location: [facility] IN[facility] CV [facility];  Service: Cardiovascular;  Laterality: N/A;   ABDOMINAL HYSTERECTOMY     fibroids   ATRIAL FIBRILLATION ABLATION N/A 12/09/2020   Procedure: ATRIAL FIBRILLATION ABLATION;  Surgeon: Hillis Range, MD;  Location: [facility] CV LAB;  Service: Cardiovascular;  Laterality: N/A;   BIOPSY  03/20/2022   Procedure: BIOPSY;  Surgeon: Lanelle Bal, DO;  Location: [facility] ENDO [facility];  Service: Endoscopy;;   blood clot removal from base of brain  1990   CARDIOVERSION N/A 10/04/2020   Procedure: CARDIOVERSION;  Surgeon: Meriam Sprague, MD;  Location: [facility] ENDOSCOPY;  Service: Cardiovascular;  Laterality: N/A;   CARPAL TUNNEL RELEASE     CATARACT EXTRACTION W/PHACO Left 06/16/2013   Procedure: CATARACT EXTRACTION PHACO AND INTRAOCULAR LENS PLACEMENT (IOC);  Surgeon: Gemma Payor, MD;  Location: [facility] ORS;  Service: Ophthalmology;  Laterality: Left;  CDE:  12.18   CATARACT EXTRACTION W/PHACO Right 06/26/2013   Procedure: CATARACT EXTRACTION PHACO AND INTRAOCULAR LENS PLACEMENT (IOC);  Surgeon: Gemma Payor, MD;  Location: [facility] ORS;  Service: Ophthalmology;  Laterality: Right;  CDE:14.71   CESAREAN SECTION     CHOLECYSTECTOMY     COLONOSCOPY WITH PROPOFOL N/A 04/08/2019   Dr.Fields: diverticulosis, 11 simple adenomas removed, hemorrhoids. next colonoscopy in 3 years.    COLONOSCOPY WITH PROPOFOL N/A 03/20/2022   Procedure: COLONOSCOPY WITH PROPOFOL;  Surgeon: Lanelle Bal, DO;  Location: [facility];  Service: Endoscopy;  Laterality: N/A;  9:45am   DILATION AND CURETTAGE OF UTERUS     ESOPHAGOGASTRODUODENOSCOPY (EGD) WITH PROPOFOL N/A 04/08/2019   Dr. Darrick Penna: H.pylori  gastritis   ESOPHAGOGASTRODUODENOSCOPY (EGD) WITH PROPOFOL N/A 03/20/2022   Procedure: ESOPHAGOGASTRODUODENOSCOPY (EGD) WITH PROPOFOL;  Surgeon: Lanelle Bal, DO;  [facility];  Service: Endoscopy;  Laterality: N/A;   EYE SURGERY  12/2016   FOOT NEUROMA SURGERY Right    LOOP RECORDER INSERTION N/A 11/06/2017   Procedure: LOOP RECORDER INSERTION;  Surgeon: Hillis Range, MD;  [facility]: [facility];  Service: Cardiovascular;  Laterality: N/A;   MOUTH SURGERY     POLYPECTOMY  04/08/2019   Procedure: POLYPECTOMY;  Surgeon: West Bali, MD;  Location: [facility];  Service: Endoscopy;;   POLYPECTOMY  03/20/2022   Procedure: POLYPECTOMY;  Surgeon: Lanelle Bal, DO;  Location[facility];  Service: Endoscopy;;   TEE WITHOUT CARDIOVERSION N/A 12/09/2020   Procedure: TRANSESOPHAGEAL ECHOCARDIOGRAM (TEE);  Surgeon: Lewayne Bunting, MD;  Location: [facility] ENDOSCOPY;  Service: Cardiovascular;  Laterality: N/A;   TONSILLECTOMY      Family History  Problem Relation Age of Onset   Emphysema Mother    Alcohol abuse Mother    Arthritis Mother    COPD Mother    Depression Mother    Hyperlipidemia Mother    Hypertension Mother    Heart attack Father    Cancer Father        lung   Heart disease Father    Diabetes Brother    COPD Brother    Other Maternal Grandmother        swine flu 1919   Colon cancer Neg Hx    Colon polyps Neg Hx    Liver disease Neg Hx     Allergies as of 04/05/2023 - Review Complete 04/05/2023  Allergen Reaction Noted   Trazodone and nefazodone Hives and Other (See Comments) 01/10/2017   Benicar [olmesartan]  11/30/2020   Nickel Other (See Comments) 10/04/2017   Tetanus toxoids Other (See Comments) 06/28/2018   Adhesive [tape] Itching 06/09/2013   Bactrim [sulfamethoxazole-trimethoprim] Itching and Rash 06/09/2013   Penicillins Itching, Rash, and Other (See Comments) 06/09/2013   Ppd [tuberculin purified protein derivative] Swelling and  Other (See Comments) 06/09/2013    Social History   Socioeconomic History   Marital status: Widowed  Spouse name: Not on file   Number of children: 1   Years of education: 10   Highest education level: Not on file  Occupational History   Occupation: retired    Comment: catering, Production manager  Tobacco Use   Smoking status: Former    Current packs/day: 0.00    Average packs/day: 1.5 packs/day for 42.7 years (64.0 ttl pk-yrs)    Types: Cigarettes    Start date: 09/25/1958    Quit date: 06/06/2001    Years since quitting: 21.8   Smokeless tobacco: Never  Vaping Use   Vaping status: Never Used  Substance and Sexual Activity   Alcohol use: Not Currently    Comment: used to drink an occasional alcoholic beverage. No hyistory of heavy alcohol use.   Drug use: Never   Sexual activity: Not Currently  Other Topics Concern   Not on file  Social History Narrative   Lives alone   Son lives in [place]   TV, reads, puzzles   Social Determinants of Health   Financial Resource Strain: Low Risk  (09/29/2020)   Received from [facility], [facility]   Overall Financial Resource Strain (CARDIA)    Difficulty of Paying Living Expenses: Not hard at all  Food Insecurity: No Food Insecurity (09/29/2020)   Received from [facility]   Hunger Vital Sign    Worried About Running Out of Food in the Last Year: Never true    Ran Out of Food in the Last Year: Never true  Transportation Needs: No Transportation Needs (09/29/2020)   Received from [facility], [facility]   [facility] - Transportation    Lack of Transportation (Medical): No    Lack of Transportation (Non-Medical): No  Physical Activity: Not on file  Stress: Not on file  Social Connections: Not on file     Review of Systems   Gen: Denies fever, chills, anorexia. Denies fatigue, weakness, weight loss.  CV: Denies chest pain, palpitations, syncope, peripheral edema, and claudication. Resp:  Denies dyspnea at rest, cough, wheezing, coughing up blood, and pleurisy. GI: See HPI Derm: Denies rash, itching, dry skin Psych: Denies depression, anxiety, memory loss, confusion. No homicidal or suicidal ideation.  Heme: Denies bruising, bleeding, and enlarged lymph nodes.   Physical Exam   BP 135/79 (BP Location: Right Arm, Patient Position: Sitting, Cuff Size: Large)   Pulse 65   Temp 98 F (36.7 C) (Oral)   Ht 5' 2.5" (1.588 m)   Wt 222 lb (100.7 kg)   SpO2 93%   BMI 39.96 kg/m   General:   Alert and oriented. No distress noted. Pleasant and cooperative.  Head:  Normocephalic and atraumatic. Eyes:  Conjuctiva clear without scleral icterus. Mouth:  Oral mucosa pink and moist. Good dentition. No lesions. Lungs:  Clear to auscultation bilaterally. No wheezes, rales, or rhonchi. No distress.  Heart:  S1, S2 present without murmurs appreciated.  Abdomen:  +BS, soft, non-tender and non-distended. No rebound or guarding. No HSM or masses noted. Rectal: deferred Msk:  Symmetrical without gross deformities. Normal posture. Extremities:  Without edema. Neurologic:  Alert and  oriented x4 Psych:  Alert and cooperative. Normal mood and affect.   Assessment  Linda [name] is a 77 y.o. female with a history of colon polyps, IDA following with hematology maintained on oral iron and periodic IV iron, anxiety, depression, diabetes, COPD, GERD, HTN, HLD, sleep apnea, afib/aflutter s/p ablation in 2022, H. Pylori s/p  eradication, and cirrhosis presenting today for follow up.   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 Noted on imaging in 2021.  Remains well compensated.  MELD 3.0 score stable at 12.  No history of edema, ascites, or varices.  No signs of any jaundice, confusion, or other mental status change.  Believes she has completed hepatitis a and B vaccination.  Reinforced nutrition today.  No evidence of hepatoma on ultrasound in December, currently due for screening.  Prior AFP within normal  limits. Has had improved blood glucose management.   IDA: Chronic.  Maintained on Eliquis for A-fib and follows with hematology.  Is taking iron therapy every other day.  HEENT CS up-to-date.  No alarm symptoms present and is currently asymptomatic.  GERD: Denies any epigastric pain but has had some intermittent left upper quadrant " catching"/discomfort over the last couple months.  This occurs intermittently and may last only few minutes at a time.  Denies any heartburn symptoms, nausea, vomiting, or dysphagia.  Advised to try famotidine for 2 weeks to see if this helps with her symptoms.  Constipation: Has been working to increase fiber in her diet.  Usually has a bowel movement every day, occasionally may skip a day.  PLAN   Famotidine 20 mg once daily for a couple weeks RUQ [place] and NIC for 6 months MELD labs in 6 months 2g sodium diet High protein, low fat diet Oral iron every other day  Follow up in 6 months   Linda Bonito, MSN, FNP-BC, AGACNP-BC [facility]

## 2023-04-05 NOTE — Patient Instructions (Addendum)
Try taking famotidine/Pepcid 20 mg once daily for a week or 2 to see if this helps with your voice changes as well as your left upper quadrant discomfort.  We will contact you to get you scheduled for an ultrasound of your liver.  We will mail you lab slips and contact you to repeat your ultrasound in December.  Continue your iron as you have been.  Cirrhosis Lifestyle Recommendations:  High-protein diet from a primarily plant-based diet. Avoid red meat.  No raw or undercooked meat, seafood, or shellfish. Low-fat/cholesterol/carbohydrate diet. Limit sodium to no more than 2000 mg/day including everything that you eat and drink. Recommend at least 30 minutes of aerobic and resistance exercise 3 days/week. Limit Tylenol to 2000 mg daily.   We will plan to follow-up in December/early January.   It was a pleasure to see you today. I want to create trusting relationships with patients. If you receive a survey regarding your visit,  I greatly appreciate you taking time to fill this out on paper or through your MyChart. I value your feedback.  Brooke Bonito, MSN, FNP-BC, AGACNP-BC [facility]

## 2023-04-10 DIAGNOSIS — L570 Actinic keratosis: Secondary | ICD-10-CM | POA: Diagnosis not present

## 2023-04-10 DIAGNOSIS — L720 Epidermal cyst: Secondary | ICD-10-CM | POA: Diagnosis not present

## 2023-04-10 DIAGNOSIS — B353 Tinea pedis: Secondary | ICD-10-CM | POA: Diagnosis not present

## 2023-04-10 DIAGNOSIS — L299 Pruritus, unspecified: Secondary | ICD-10-CM | POA: Diagnosis not present

## 2023-04-19 ENCOUNTER — Ambulatory Visit (HOSPITAL_COMMUNITY)
Admission: RE | Admit: 2023-04-19 | Discharge: 2023-04-19 | Disposition: A | Discharge status: Home / Self Care | Payer: Medicare Other | Source: Ambulatory Visit | Attending: Gastroenterology | Admitting: Gastroenterology

## 2023-04-19 DIAGNOSIS — K7460 Unspecified cirrhosis of liver: Secondary | ICD-10-CM | POA: Diagnosis not present

## 2023-04-19 DIAGNOSIS — K7689 Other specified diseases of liver: Secondary | ICD-10-CM | POA: Diagnosis not present

## 2023-04-25 ENCOUNTER — Encounter (HOSPITAL_COMMUNITY): Payer: Self-pay

## 2023-04-25 ENCOUNTER — Ambulatory Visit (HOSPITAL_COMMUNITY)
Admission: RE | Admit: 2023-04-25 | Discharge: 2023-04-25 | Disposition: A | Discharge status: Home / Self Care | Payer: Medicare Other | Source: Ambulatory Visit | Attending: Internal Medicine | Admitting: Internal Medicine

## 2023-04-25 DIAGNOSIS — Z1231 Encounter for screening mammogram for malignant neoplasm of breast: Secondary | ICD-10-CM | POA: Diagnosis not present

## 2023-06-07 DIAGNOSIS — I10 Essential (primary) hypertension: Secondary | ICD-10-CM | POA: Diagnosis not present

## 2023-06-07 DIAGNOSIS — E1159 Type 2 diabetes mellitus with other circulatory complications: Secondary | ICD-10-CM | POA: Diagnosis not present

## 2023-06-07 DIAGNOSIS — J449 Chronic obstructive pulmonary disease, unspecified: Secondary | ICD-10-CM | POA: Diagnosis not present

## 2023-06-07 DIAGNOSIS — D692 Other nonthrombocytopenic purpura: Secondary | ICD-10-CM | POA: Diagnosis not present

## 2023-06-07 DIAGNOSIS — Z299 Encounter for prophylactic measures, unspecified: Secondary | ICD-10-CM | POA: Diagnosis not present

## 2023-06-07 DIAGNOSIS — K219 Gastro-esophageal reflux disease without esophagitis: Secondary | ICD-10-CM | POA: Diagnosis not present

## 2023-06-07 DIAGNOSIS — I152 Hypertension secondary to endocrine disorders: Secondary | ICD-10-CM | POA: Diagnosis not present

## 2023-06-11 DIAGNOSIS — H35372 Puckering of macula, left eye: Secondary | ICD-10-CM | POA: Diagnosis not present

## 2023-06-15 DIAGNOSIS — Z23 Encounter for immunization: Secondary | ICD-10-CM | POA: Diagnosis not present

## 2023-06-20 DIAGNOSIS — L821 Other seborrheic keratosis: Secondary | ICD-10-CM | POA: Diagnosis not present

## 2023-06-20 DIAGNOSIS — B351 Tinea unguium: Secondary | ICD-10-CM | POA: Diagnosis not present

## 2023-06-20 DIAGNOSIS — L820 Inflamed seborrheic keratosis: Secondary | ICD-10-CM | POA: Diagnosis not present

## 2023-06-20 DIAGNOSIS — B353 Tinea pedis: Secondary | ICD-10-CM | POA: Diagnosis not present

## 2023-06-20 DIAGNOSIS — L209 Atopic dermatitis, unspecified: Secondary | ICD-10-CM | POA: Diagnosis not present

## 2023-06-20 DIAGNOSIS — L299 Pruritus, unspecified: Secondary | ICD-10-CM | POA: Diagnosis not present

## 2023-07-10 DIAGNOSIS — Z23 Encounter for immunization: Secondary | ICD-10-CM | POA: Diagnosis not present

## 2023-08-03 DIAGNOSIS — D6869 Other thrombophilia: Secondary | ICD-10-CM | POA: Diagnosis not present

## 2023-08-03 DIAGNOSIS — Z299 Encounter for prophylactic measures, unspecified: Secondary | ICD-10-CM | POA: Diagnosis not present

## 2023-08-03 DIAGNOSIS — I10 Essential (primary) hypertension: Secondary | ICD-10-CM | POA: Diagnosis not present

## 2023-08-03 DIAGNOSIS — L97919 Non-pressure chronic ulcer of unspecified part of right lower leg with unspecified severity: Secondary | ICD-10-CM | POA: Diagnosis not present

## 2023-08-03 DIAGNOSIS — I700 Atherosclerosis of aorta: Secondary | ICD-10-CM | POA: Diagnosis not present

## 2023-08-03 DIAGNOSIS — I4891 Unspecified atrial fibrillation: Secondary | ICD-10-CM | POA: Diagnosis not present

## 2023-08-07 ENCOUNTER — Inpatient Hospital Stay: Payer: Medicare Other | Attending: Physician Assistant

## 2023-08-07 DIAGNOSIS — Z87891 Personal history of nicotine dependence: Secondary | ICD-10-CM | POA: Insufficient documentation

## 2023-08-07 DIAGNOSIS — R0602 Shortness of breath: Secondary | ICD-10-CM | POA: Insufficient documentation

## 2023-08-07 DIAGNOSIS — D696 Thrombocytopenia, unspecified: Secondary | ICD-10-CM | POA: Insufficient documentation

## 2023-08-07 DIAGNOSIS — I4891 Unspecified atrial fibrillation: Secondary | ICD-10-CM | POA: Diagnosis not present

## 2023-08-07 DIAGNOSIS — D509 Iron deficiency anemia, unspecified: Secondary | ICD-10-CM | POA: Insufficient documentation

## 2023-08-07 DIAGNOSIS — R5383 Other fatigue: Secondary | ICD-10-CM | POA: Diagnosis not present

## 2023-08-07 DIAGNOSIS — K219 Gastro-esophageal reflux disease without esophagitis: Secondary | ICD-10-CM | POA: Diagnosis not present

## 2023-08-07 DIAGNOSIS — E538 Deficiency of other specified B group vitamins: Secondary | ICD-10-CM

## 2023-08-07 DIAGNOSIS — Z7901 Long term (current) use of anticoagulants: Secondary | ICD-10-CM | POA: Diagnosis not present

## 2023-08-07 DIAGNOSIS — R911 Solitary pulmonary nodule: Secondary | ICD-10-CM | POA: Diagnosis not present

## 2023-08-07 DIAGNOSIS — D500 Iron deficiency anemia secondary to blood loss (chronic): Secondary | ICD-10-CM

## 2023-08-07 LAB — VITAMIN B12: Vitamin B-12: 1146 pg/mL — ABNORMAL HIGH (ref 180–914)

## 2023-08-07 LAB — CBC WITH DIFFERENTIAL/PLATELET
Abs Immature Granulocytes: 0.01 10*3/uL (ref 0.00–0.07)
Basophils Absolute: 0 10*3/uL (ref 0.0–0.1)
Basophils Relative: 0 %
Eosinophils Absolute: 0.1 10*3/uL (ref 0.0–0.5)
Eosinophils Relative: 2 %
HCT: 40.4 % (ref 36.0–46.0)
Hemoglobin: 13.4 g/dL (ref 12.0–15.0)
Immature Granulocytes: 0 %
Lymphocytes Relative: 20 %
Lymphs Abs: 1.1 10*3/uL (ref 0.7–4.0)
MCH: 33.8 pg (ref 26.0–34.0)
MCHC: 33.2 g/dL (ref 30.0–36.0)
MCV: 102 fL — ABNORMAL HIGH (ref 80.0–100.0)
Monocytes Absolute: 0.4 10*3/uL (ref 0.1–1.0)
Monocytes Relative: 7 %
Neutro Abs: 4.1 10*3/uL (ref 1.7–7.7)
Neutrophils Relative %: 71 %
Platelets: 107 10*3/uL — ABNORMAL LOW (ref 150–400)
RBC: 3.96 MIL/uL (ref 3.87–5.11)
RDW: 13.5 % (ref 11.5–15.5)
WBC: 5.7 10*3/uL (ref 4.0–10.5)
nRBC: 0 % (ref 0.0–0.2)

## 2023-08-07 LAB — COMPREHENSIVE METABOLIC PANEL
ALT: 27 U/L (ref 0–44)
AST: 34 U/L (ref 15–41)
Albumin: 3.3 g/dL — ABNORMAL LOW (ref 3.5–5.0)
Alkaline Phosphatase: 96 U/L (ref 38–126)
Anion gap: 8 (ref 5–15)
BUN: 20 mg/dL (ref 8–23)
CO2: 27 mmol/L (ref 22–32)
Calcium: 9.7 mg/dL (ref 8.9–10.3)
Chloride: 104 mmol/L (ref 98–111)
Creatinine, Ser: 0.8 mg/dL (ref 0.44–1.00)
GFR, Estimated: >60 mL/min (ref >60)
Glucose, Bld: 145 mg/dL — ABNORMAL HIGH (ref 70–99)
Potassium: 3.9 mmol/L (ref 3.5–5.1)
Sodium: 139 mmol/L (ref 135–145)
Total Bilirubin: 1.5 mg/dL — ABNORMAL HIGH (ref <1.2)
Total Protein: 6.6 g/dL (ref 6.5–8.1)

## 2023-08-07 LAB — FERRITIN: Ferritin: 48 ng/mL (ref 11–307)

## 2023-08-07 LAB — IRON AND TIBC
Iron: 71 ug/dL (ref 28–170)
Saturation Ratios: 21 % (ref 10.4–31.8)
TIBC: 336 ug/dL (ref 250–450)
UIBC: 265 ug/dL

## 2023-08-09 ENCOUNTER — Encounter: Payer: Self-pay | Admitting: Gastroenterology

## 2023-08-13 NOTE — Progress Notes (Unsigned)
[facility] [address], [place] [address]   CLINIC:  Medical Oncology/Hematology  PCP:  Ignatius Specking, MD [address] [place] [address] [phone]   REASON FOR VISIT:  Follow-up for iron deficiency anemia and thrombocytopenia   CURRENT THERAPY: Intermittent IV iron  INTERVAL HISTORY:   Linda [name] 77 y.o. female returns for routine follow-up of iron deficiency anemia and thrombocytopenia.  She was last seen by Rojelio Brenner PA-C on 02/14/2023. *** She reports feeling improved energy after IV iron in May/June 2024. *** Energy today is ***. *** She has chronic pica, reports that "she has always loved ice."    *** She denies any bright red blood per rectum, melena, or epistaxis.   ***She continues to bruise easily but denies any petechial rash.   ***She is on Eliquis for atrial fibrillation.   ***She has had some increased headaches recently.   ***She denies any restless legs, chest pain, lightheadedness, or syncope.   ***She has chronic shortness of breath with exertion, which is at baseline.   ***No B symptoms such as fever, chills, night sweats, or unintentional weight loss.   ***She remains on her iron tablet every other day.  *** Vitamin B12 was discontinued at last appointment. *** She has little to no*** energy and 100***% appetite. She endorses that she is maintaining a stable weight.  ASSESSMENT & PLAN:  1.  Iron deficiency anemia: - Colonoscopy on 04/08/2019 with normal ileum, moderate diverticulosis in the rectosigmoid colon, external and internal hemorrhoids.  Pathology consistent with tubular adenomas. - EGD (03/20/2022): Gastritis.  No esophageal varices. - Colonoscopy (03/20/2022): Nonbleeding internal hemorrhoids, diverticulosis, polyps x4 - Likely secondary to malabsorption, patient is on oral iron without improvement - Most recent IV iron with Feraheme x 2 in May/June 2024 - She is taking iron tablet every other day*** - Patient  denies bright red blood per rectum or melena   *** - She is symptomatic with fatigue and pica*** - Most recent labs (08/07/2023): Hgb 13.4/MCV 102.0 (macrocytosis in the setting of liver cirrhosis).  Normal creatinine.  Ferritin 48, iron saturation 21%.  - PLAN:  Recommend IV Feraheme x 2 due to symptomatic iron deficiency.  *** - Continue iron tablet every other day. - CBC and iron panel with RTC in 6 months, or sooner if needed based on symptoms  - Continue follow-up with gastroenterology   2.  Thrombocytopenia - Mild thrombocytopenia since 2018 - CBC on 09/23/2020 did note some platelet clumping could be skewing the counts - CT scan of the chest on 01/05/2017 at [facility] showed borderline splenomegaly.  No comment on the liver. - Abdominal ultrasound (08/04/2021): Heterogeneous liver with slightly nodular contour suspicious for either diffuse hepatocellular disease or cirrhosis; mild splenomegaly 575 cc. - She follows with [facility] for cirrhosis - She admits to easy bruising but denies any petechial rash or abnormal bleeding events.*** - Most recent labs (08/07/2023): Platelets 107, WBC 5.7, normal differential. - Previously taking vitamin B12 supplements, but these were stopped at last visit due to vitamin B12 >2000.  (Normal folate when checked in May 2023). - Most recent labs (08/07/2023): Vitamin B12 1146, MMA pending.   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  May also have some element of ITP.  Less likely would be early myelodysplasia. - PLAN: Continue surveillance - No indication to restart B12 at this time. - Continue to monitor with repeat  CBC, B12, MMA at follow-up visit in 6 months to see if she needs to restart low dose B12 tablets at that time - If any significant deviation from baseline, would consider bone marrow biopsy.   3.  Atrial fibrillation: - Continue Eliquis twice daily.    4.   Pulmonary nodule - Patient is following with PCP (Dr. Sherril Croon)  PLAN SUMMARY: >> IV Feraheme x2  >> Labs in 6 months (CBC/D, CMP, ferritin, iron/TIBC, B12, MMA) >> OFFICE visit in 6 months, 1 week after labs     REVIEW OF SYSTEMS: ***  Review of Systems  Constitutional:  Positive for fatigue. Negative for appetite change, chills, diaphoresis, fever and unexpected weight change.  HENT:   Negative for lump/mass and nosebleeds.   Eyes:  Negative for eye problems.  Respiratory:  Positive for cough (at times) and shortness of breath (with exertion). Negative for hemoptysis.   Cardiovascular:  Negative for chest pain, leg swelling and palpitations.  Gastrointestinal:  Negative for abdominal pain, blood in stool, constipation, diarrhea, nausea and vomiting.  Genitourinary:  Negative for hematuria.   Skin: Negative.   Neurological:  Positive for dizziness, headaches and numbness. Negative for light-headedness.  Hematological:  Does not bruise/bleed easily.  Psychiatric/Behavioral:  Positive for depression. The patient is nervous/anxious.      PHYSICAL EXAM:  ECOG PERFORMANCE STATUS: 1 - Symptomatic but completely ambulatory *** There were no vitals filed for this visit. There were no vitals filed for this visit. Physical Exam Constitutional:      Appearance: Normal appearance. She is obese.  Cardiovascular:     Heart sounds: Normal heart sounds.  Pulmonary:     Breath sounds: Normal breath sounds.  Neurological:     General: No focal deficit present.     Mental Status: Mental status is at baseline.  Psychiatric:        Behavior: Behavior normal. Behavior is cooperative.     PAST MEDICAL/SURGICAL HISTORY:  Past Medical History:  Diagnosis Date   Allergy    Anemia 07/25/2017   Anxiety    Arthritis    Atrial fibrillation [facility])    s/p ablation in March 2022, ILP remains in place   Cataract    COPD (chronic obstructive pulmonary disease) (HCC)    Depression    Diabetes  mellitus without complication (HCC)    Emphysema of lung (HCC)    GERD (gastroesophageal reflux disease) 10/28/2020   Hyperlipidemia    Hypertension    Oxygen deficiency    Sleep apnea    Typical atrial flutter (HCC)    Past Surgical History:  Procedure Laterality Date   A-FLUTTER ABLATION N/A 10/04/2017   Procedure: A-FLUTTER ABLATION;  Surgeon: Hillis Range, MD;  [facility] LAB;  Service: Cardiovascular;  Laterality: N/A;   ABDOMINAL HYSTERECTOMY     fibroids   ATRIAL FIBRILLATION ABLATION N/A 12/09/2020   Procedure: ATRIAL FIBRILLATION ABLATION;  Surgeon: Hillis Range, MD;  Location: [facility] CV LAB;  Service: Cardiovascular;  Laterality: N/A;   BIOPSY  03/20/2022   Procedure: BIOPSY;  Surgeon: Lanelle Bal, DO;  Location: [facility] ENDO [facility];  Service: Endoscopy;;   blood clot removal from base of brain  1990   CARDIOVERSION N/A 10/04/2020   Procedure: CARDIOVERSION;  Surgeon: Meriam Sprague, MD;  Location: [facility] ENDOSCOPY;  Service: Cardiovascular;  Laterality: N/A;   CARPAL TUNNEL RELEASE     CATARACT EXTRACTION W/PHACO Left 06/16/2013   Procedure: CATARACT EXTRACTION PHACO AND INTRAOCULAR LENS PLACEMENT (IOC);  Surgeon: Gemma Payor, MD;  Location: [facility] ORS;  Service: Ophthalmology;  Laterality: Left;  CDE:  12.18   CATARACT EXTRACTION W/PHACO Right 06/26/2013   Procedure: CATARACT EXTRACTION PHACO AND INTRAOCULAR LENS PLACEMENT (IOC);  Surgeon: Gemma Payor, MD;  Location: [facility] ORS;  Service: Ophthalmology;  Laterality: Right;  CDE:14.71   CESAREAN SECTION     CHOLECYSTECTOMY     COLONOSCOPY WITH PROPOFOL N/A 04/08/2019   Dr.Fields: diverticulosis, 11 simple adenomas removed, hemorrhoids. next colonoscopy in 3 years.    COLONOSCOPY WITH PROPOFOL N/A 03/20/2022   Procedure: COLONOSCOPY WITH PROPOFOL;  Surgeon: Lanelle Bal, DO;  [facility]: [facility];  Service: Endoscopy;  Laterality: N/A;  9:45am   DILATION AND CURETTAGE OF UTERUS     ESOPHAGOGASTRODUODENOSCOPY  (EGD) WITH PROPOFOL N/A 04/08/2019   Dr. Darrick Penna: H.pylori gastritis   ESOPHAGOGASTRODUODENOSCOPY (EGD) WITH PROPOFOL N/A 03/20/2022   Procedure: ESOPHAGOGASTRODUODENOSCOPY (EGD) WITH PROPOFOL;  Surgeon: Lanelle Bal, DO;  [facility];  Service: Endoscopy;  Laterality: N/A;   EYE SURGERY  12/2016   FOOT NEUROMA SURGERY Right    LOOP RECORDER INSERTION N/A 11/06/2017   Procedure: LOOP RECORDER INSERTION;  Surgeon: Hillis Range, MD;  [facility]: [facility];  Service: Cardiovascular;  Laterality: N/A;   MOUTH SURGERY     POLYPECTOMY  04/08/2019   Procedure: POLYPECTOMY;  Surgeon: West Bali, MD;  Location: [facility];  Service: Endoscopy;;   POLYPECTOMY  03/20/2022   Procedure: POLYPECTOMY;  Surgeon: Lanelle Bal, DO;  [facility]: [facility];  Service: Endoscopy;;   TEE WITHOUT CARDIOVERSION N/A 12/09/2020   Procedure: TRANSESOPHAGEAL ECHOCARDIOGRAM (TEE);  Surgeon: Lewayne Bunting, MD;  Location: [facility] ENDOSCOPY;  Service: Cardiovascular;  Laterality: N/A;   TONSILLECTOMY      SOCIAL HISTORY:  Social History   Socioeconomic History   Marital status: Widowed    Spouse name: Not on file   Number of children: 1   Years of education: 76   Highest education level: Not on file  Occupational History   Occupation: retired    Comment: catering, Production manager  Tobacco Use   Smoking status: Former    Current packs/day: 0.00    Average packs/day: 1.5 packs/day for 42.7 years (64.0 ttl pk-yrs)    Types: Cigarettes    Start date: 09/25/1958    Quit date: 06/06/2001    Years since quitting: 22.2   Smokeless tobacco: Never  Vaping Use   Vaping status: Never Used  Substance and Sexual Activity   Alcohol use: Not Currently    Comment: used to drink an occasional alcoholic beverage. No hyistory of heavy alcohol use.   Drug use: Never   Sexual activity: Not Currently  Other Topics Concern   Not on file  Social History Narrative   Lives alone   Son lives in [place]   TV, reads, puzzles   Social Determinants of Health   Financial Resource Strain: Low Risk  (09/29/2020)   Received from [facility], [facility]   Overall Financial Resource Strain (CARDIA)    Difficulty of Paying Living Expenses: Not hard at all  Food Insecurity: No Food Insecurity (09/29/2020)   Received from [facility], [facility]   Hunger Vital Sign    Worried About Running Out of Food in the Last Year: Never true    Ran Out of Food in the Last Year: Never true  Transportation Needs: No Transportation Needs (09/29/2020)   Received from  [facility], [facility]   PRAPARE - Transportation    Lack of Transportation (Medical): No    Lack of Transportation (Non-Medical): No  Physical Activity: Not on file  Stress: Not on file  Social Connections: Not on file  Intimate Partner Violence: Not At Risk (09/27/2020)   [facility] from [facility], [facility]   Humiliation, Afraid, Rape, and Kick questionnaire    Fear of Current or Ex-Partner: No    Emotionally Abused: No    Physically Abused: No    Sexually Abused: No    FAMILY HISTORY:  Family History  Problem Relation Age of Onset   Emphysema Mother    Alcohol abuse Mother    Arthritis Mother    COPD Mother    Depression Mother    Hyperlipidemia Mother    Hypertension Mother    Heart attack Father    Cancer Father        lung   Heart disease Father    Diabetes Brother    COPD Brother    Other Maternal Grandmother        swine flu 1919   Colon cancer Neg Hx    Colon polyps Neg Hx    Liver disease Neg Hx     CURRENT MEDICATIONS:  Outpatient Encounter Medications as of 08/14/2023  Medication Sig   acetaminophen (TYLENOL) 500 MG tablet Take 1,000 mg by mouth every 6 (six) hours as needed for moderate pain or headache.   ALPHA LIPOIC ACID PO Take 1 capsule by mouth daily.   apixaban (ELIQUIS) 5 MG TABS tablet Take 1 tablet (5 mg total) by mouth 2 (two) times daily.   Ascorbic Acid  (VITAMIN C) 1000 MG tablet Take 1,000 mg by mouth daily.   busPIRone (BUSPAR) 5 MG tablet Take 5 mg by mouth 2 (two) times daily.   Calcium Carbonate-Vit D-Min (CALCIUM 600+D3 PLUS MINERALS) 600-800 MG-UNIT TABS Take 1 tablet by mouth daily.   cetirizine (ZYRTEC) 10 MG tablet Take 10 mg by mouth daily.   cholecalciferol (VITAMIN D3) 25 MCG (1000 UNIT) tablet Take 1,000 Units by mouth daily.   CINNAMON PO Take 2,400 mg by mouth at bedtime.   DULoxetine (CYMBALTA) 30 MG capsule Take 30 mg by mouth daily.   ferrous sulfate 325 (65 FE) MG EC tablet Take 325 mg by mouth every other day.   fluticasone-salmeterol (WIXELA INHUB) 250-50 MCG/ACT AEPB Inhale 1 puff into the lungs in the morning and at bedtime.   FREESTYLE LITE test strip    glipiZIDE (GLUCOTROL) 5 MG tablet Take 5 mg by mouth daily.   Melatonin 10 MG TABS Take 10 mg by mouth at bedtime.   metFORMIN (GLUCOPHAGE) 500 MG tablet Take 500 mg by mouth daily.   metoprolol succinate (TOPROL-XL) 25 MG 24 hr tablet TAKE 1 TABLET DAILY (DOSE DECREASED 03/25/21)   Multiple Vitamin (MULTIVITAMIN WITH MINERALS) TABS tablet Take 1 tablet by mouth daily.   NON FORMULARY B Pap Machine   OXYGEN Inhale 2 L into the lungs at bedtime. With BPAP   pravastatin (PRAVACHOL) 20 MG tablet Take 1 tablet (20 mg total) daily by mouth.   PROAIR HFA 108 (90 Base) MCG/ACT inhaler Inhale 2 puffs into the lungs every 4 (four) hours as needed for wheezing or shortness of breath.   Probiotic Product (PROBIOTIC DAILY PO) Take 1 capsule by mouth daily.    Red Yeast Rice 600 MG TABS Take 1,200 mg by mouth daily.   Zinc  30 MG CAPS Take 30 mg by mouth daily.   No facility-administered encounter medications on file as of 08/14/2023.    ALLERGIES:  Allergies  Allergen Reactions   Trazodone And Nefazodone Hives and Other (See Comments)    "blisters my skin"    Benicar [Olmesartan]     Pt does not remember    Nickel Other (See Comments)    "oozing"   Tetanus Toxoids Other  (See Comments)    Left a knot   Adhesive [Tape] Itching   Bactrim [Sulfamethoxazole-Trimethoprim] Itching and Rash   Penicillins Itching, Rash and Other (See Comments)    Has patient had a PCN reaction causing immediate rash, facial/tongue/throat swelling, SOB or lightheadedness with hypotension: Unknown Has patient had a PCN reaction causing severe rash involving mucus membranes or skin necrosis: Unknown Has patient had a PCN reaction that required hospitalization: No Has patient had a PCN reaction occurring within the last 10 years: No If all of the above answers are "NO", then may proceed with Cephalosporin use   Ppd [Tuberculin Purified Protein Derivative] Swelling and Other (See Comments)    Local arm swelling    LABORATORY DATA:  I have reviewed the labs as listed.  CBC    Component Value Date/Time   WBC 5.7 08/07/2023 1247   RBC 3.96 08/07/2023 1247   HGB 13.4 08/07/2023 1247   HGB 13.6 11/30/2020 0930   HCT 40.4 08/07/2023 1247   HCT 41.5 11/30/2020 0930   PLT 107 (L) 08/07/2023 1247   PLT 129 (L) 11/30/2020 0930   MCV 102.0 (H) 08/07/2023 1247   MCV 99 (H) 11/30/2020 0930   MCH 33.8 08/07/2023 1247   MCHC 33.2 08/07/2023 1247   RDW 13.5 08/07/2023 1247   RDW 13.3 11/30/2020 0930   LYMPHSABS 1.1 08/07/2023 1247   LYMPHSABS 1.0 11/30/2020 0930   MONOABS 0.4 08/07/2023 1247   EOSABS 0.1 08/07/2023 1247   EOSABS 0.1 11/30/2020 0930   BASOSABS 0.0 08/07/2023 1247   BASOSABS 0.0 11/30/2020 0930      Latest Ref Rng & Units 08/07/2023   12:47 PM 03/15/2023    9:37 AM 02/08/2023    8:47 AM  CMP  Glucose 70 - 99 mg/dL 161  096  045   BUN 8 - 23 mg/dL 20  17  18    Creatinine 0.44 - 1.00 mg/dL 4.09  8.11  9.14   Sodium 135 - 145 mmol/L 139  137  140   Potassium 3.5 - 5.1 mmol/L 3.9  4.1  4.1   Chloride 98 - 111 mmol/L 104  102  103   CO2 22 - 32 mmol/L 27  27  32   Calcium 8.9 - 10.3 mg/dL 9.7  9.1  9.8   Total Protein 6.5 - 8.1 g/dL 6.6  6.3  6.7   Total Bilirubin  <1.2 mg/dL 1.5  1.9  1.5   Alkaline Phos 38 - 126 U/L 96  92  92   AST 15 - 41 U/L 34  40  33   ALT 0 - 44 U/L 27  29  26      DIAGNOSTIC IMAGING:  I have independently reviewed the relevant imaging and discussed with the patient.   WRAP UP:  All questions were answered. The patient knows to call the clinic with any problems, questions or concerns.  Medical decision making: Moderate  Time spent on visit: I spent 20 minutes counseling the patient face to face. The total time spent in the appointment  was 30 minutes and more than 50% was on counseling.  Carnella Guadalajara, PA-C  ***

## 2023-08-14 ENCOUNTER — Encounter: Payer: Self-pay | Admitting: Physician Assistant

## 2023-08-14 ENCOUNTER — Inpatient Hospital Stay (HOSPITAL_BASED_OUTPATIENT_CLINIC_OR_DEPARTMENT_OTHER): Payer: Medicare Other | Admitting: Physician Assistant

## 2023-08-14 VITALS — BP 137/66 | HR 80 | Temp 98.1°F | Resp 18 | Wt 226.6 lb

## 2023-08-14 DIAGNOSIS — D500 Iron deficiency anemia secondary to blood loss (chronic): Secondary | ICD-10-CM

## 2023-08-14 DIAGNOSIS — I4891 Unspecified atrial fibrillation: Secondary | ICD-10-CM | POA: Diagnosis not present

## 2023-08-14 DIAGNOSIS — R5383 Other fatigue: Secondary | ICD-10-CM | POA: Diagnosis not present

## 2023-08-14 DIAGNOSIS — R0602 Shortness of breath: Secondary | ICD-10-CM | POA: Diagnosis not present

## 2023-08-14 DIAGNOSIS — D696 Thrombocytopenia, unspecified: Secondary | ICD-10-CM | POA: Diagnosis not present

## 2023-08-14 DIAGNOSIS — D509 Iron deficiency anemia, unspecified: Secondary | ICD-10-CM | POA: Diagnosis not present

## 2023-08-14 DIAGNOSIS — Z7901 Long term (current) use of anticoagulants: Secondary | ICD-10-CM | POA: Diagnosis not present

## 2023-08-14 NOTE — Patient Instructions (Signed)
[facility] at [facility] Discharge Instructions  You were seen today by Rojelio Brenner PA-C for your iron deficiency anemia and low platelets.  Your blood levels look great, but your iron levels are low.  This is most likely why you feel so tired, in addition to your liver disease and other chronic conditions.  We will schedule you for IV iron x 2 doses.  Your B12 levels are high.  Discuss with primary care as to whether or not you should continue B12.  Your platelet levels remain somewhat low, which I believe is related to your liver disease.  You do not need treatment at this time, but we will continue to monitor these levels going forward.  FOLLOW-UP APPOINTMENT: Labs and office visit in 6 months  ** Thank you for trusting me with your healthcare!  I strive to provide all of my patients with quality care at each visit.  If you receive a survey for this visit, I would be so grateful to you for taking the time to provide feedback.  Thank you in advance!  ~ Sherman Donaldson                   Dr. Doreatha Massed   &   Rojelio Brenner, PA-C   - - - - - - - - - - - - - - - - - -     Thank you for choosing [facility] at [facility] to provide your oncology and hematology care.  To afford each patient quality time with our provider, please arrive at least 15 minutes before your scheduled appointment time.   If you have a lab appointment with the Cancer Center please come in thru the Main Entrance and check in at the main information desk.  You need to re-schedule your appointment should you arrive 10 or more minutes late.  We strive to give you quality time with our providers, and arriving late affects you and other patients whose appointments are after yours.  Also, if you no show three or more times for appointments you may be dismissed from the clinic at the providers discretion.     Again, thank you for choosing [facility].  Our  hope is that these requests will decrease the amount of time that you wait before being seen by our physicians.       _____________________________________________________________  Should you have questions after your visit to [facility], please contact our office at [phone] and follow the prompts.  Our office hours are 8:00 a.m. and 4:30 p.m. Monday - Friday.  Please note that voicemails left after 4:00 p.m. may not be returned until the following business day.  We are closed weekends and major holidays.  You do have access to a nurse 24-7, just call the main number to the clinic [phone] and do not press any options, hold on the line and a nurse will answer the phone.    For prescription refill requests, have your pharmacy contact our office and allow 72 hours.    Due to Covid, you will need to wear a mask upon entering the hospital. If you do not have a mask, a mask will be given to you at the Main Entrance upon arrival. For doctor visits, patients may have 1 support person age 75 or older with them. For treatment visits, patients can not have anyone with them due to social distancing guidelines and our  immunocompromised population.

## 2023-08-16 DIAGNOSIS — Z6841 Body Mass Index (BMI) 40.0 and over, adult: Secondary | ICD-10-CM | POA: Diagnosis not present

## 2023-08-16 DIAGNOSIS — Z0000 Encounter for general adult medical examination without abnormal findings: Secondary | ICD-10-CM | POA: Diagnosis not present

## 2023-08-16 DIAGNOSIS — F322 Major depressive disorder, single episode, severe without psychotic features: Secondary | ICD-10-CM | POA: Diagnosis not present

## 2023-08-16 DIAGNOSIS — R5383 Other fatigue: Secondary | ICD-10-CM | POA: Diagnosis not present

## 2023-08-16 DIAGNOSIS — Z1339 Encounter for screening examination for other mental health and behavioral disorders: Secondary | ICD-10-CM | POA: Diagnosis not present

## 2023-08-16 DIAGNOSIS — Z1331 Encounter for screening for depression: Secondary | ICD-10-CM | POA: Diagnosis not present

## 2023-08-16 DIAGNOSIS — Z79899 Other long term (current) drug therapy: Secondary | ICD-10-CM | POA: Diagnosis not present

## 2023-08-16 DIAGNOSIS — Z299 Encounter for prophylactic measures, unspecified: Secondary | ICD-10-CM | POA: Diagnosis not present

## 2023-08-16 DIAGNOSIS — E7800 Pure hypercholesterolemia, unspecified: Secondary | ICD-10-CM | POA: Diagnosis not present

## 2023-08-16 DIAGNOSIS — I10 Essential (primary) hypertension: Secondary | ICD-10-CM | POA: Diagnosis not present

## 2023-08-16 DIAGNOSIS — Z7189 Other specified counseling: Secondary | ICD-10-CM | POA: Diagnosis not present

## 2023-08-16 LAB — METHYLMALONIC ACID, SERUM: Methylmalonic Acid, Quantitative: 255 nmol/L (ref 0–378)

## 2023-08-17 ENCOUNTER — Other Ambulatory Visit: Payer: Self-pay | Admitting: *Deleted

## 2023-08-17 ENCOUNTER — Telehealth: Payer: Self-pay | Admitting: *Deleted

## 2023-08-17 DIAGNOSIS — K7460 Unspecified cirrhosis of liver: Secondary | ICD-10-CM

## 2023-08-17 NOTE — Telephone Encounter (Signed)
Pt informed of [place] appt date, time, locations and instructions. Verbalized understanding

## 2023-08-17 NOTE — Telephone Encounter (Signed)
Pt left vm stating she received a letter to schedule RUQ [place] in December.  [facility]  [place] scheduled for Tuesday,09/18/23, arrive at 8:15 am to check in, NPO after midnight.

## 2023-08-31 ENCOUNTER — Inpatient Hospital Stay: Payer: Medicare Other | Attending: Physician Assistant

## 2023-08-31 VITALS — BP 149/75 | HR 62 | Temp 97.5°F | Resp 19

## 2023-08-31 DIAGNOSIS — R5383 Other fatigue: Secondary | ICD-10-CM | POA: Insufficient documentation

## 2023-08-31 DIAGNOSIS — Z87891 Personal history of nicotine dependence: Secondary | ICD-10-CM | POA: Diagnosis not present

## 2023-08-31 DIAGNOSIS — I4891 Unspecified atrial fibrillation: Secondary | ICD-10-CM | POA: Diagnosis not present

## 2023-08-31 DIAGNOSIS — K219 Gastro-esophageal reflux disease without esophagitis: Secondary | ICD-10-CM | POA: Insufficient documentation

## 2023-08-31 DIAGNOSIS — D509 Iron deficiency anemia, unspecified: Secondary | ICD-10-CM | POA: Diagnosis not present

## 2023-08-31 DIAGNOSIS — R911 Solitary pulmonary nodule: Secondary | ICD-10-CM | POA: Diagnosis not present

## 2023-08-31 DIAGNOSIS — Z7901 Long term (current) use of anticoagulants: Secondary | ICD-10-CM | POA: Insufficient documentation

## 2023-08-31 DIAGNOSIS — R0602 Shortness of breath: Secondary | ICD-10-CM | POA: Insufficient documentation

## 2023-08-31 DIAGNOSIS — D696 Thrombocytopenia, unspecified: Secondary | ICD-10-CM | POA: Diagnosis not present

## 2023-08-31 MED ORDER — ACETAMINOPHEN 325 MG PO TABS
650.0000 mg | ORAL_TABLET | Freq: Once | ORAL | Status: AC
  Administered 2023-08-31: 650 mg via ORAL
  Filled 2023-08-31: qty 2

## 2023-08-31 MED ORDER — SODIUM CHLORIDE 0.9 % IV SOLN: INTRAVENOUS | Status: DC

## 2023-08-31 MED ORDER — SODIUM CHLORIDE 0.9 % IV SOLN
510.0000 mg | Freq: Once | INTRAVENOUS | Status: AC
  Administered 2023-08-31: 510 mg via INTRAVENOUS
  Filled 2023-08-31: qty 510

## 2023-08-31 MED ORDER — CETIRIZINE HCL 10 MG PO TABS: 5.0000 mg | ORAL_TABLET | Freq: Once | ORAL | Status: DC

## 2023-08-31 MED ORDER — CETIRIZINE HCL 10 MG PO TABS
10.0000 mg | ORAL_TABLET | Freq: Once | ORAL | Status: AC
  Administered 2023-08-31: 10 mg via ORAL
  Filled 2023-08-31: qty 1

## 2023-08-31 NOTE — Progress Notes (Signed)
Patient presents today for iron infusion.  Patient is in satisfactory condition with no new complaints voiced.  Vital signs are stable.  We will proceed with infusion per provider orders.    Peripheral IV started with good blood return 

## 2023-08-31 NOTE — Progress Notes (Signed)
Patient tolerated iron infusion well with no complaints voiced.  Patient left ambulatory in stable condition.  Vital signs stable at discharge.  Follow up as scheduled.

## 2023-08-31 NOTE — Patient Instructions (Signed)
 [facility] [place] - A DEPT OF [facility]  Discharge Instructions: Thank you for choosing [facility] to provide your oncology and hematology care.  If you have a lab appointment with the Cancer Center - please note that after April 8th, 2024, all labs will be drawn in the cancer center.  You do not have to check in or register with the main entrance as you have in the past but will complete your check-in in the cancer center.  Wear comfortable clothing and clothing appropriate for easy access to any Portacath or PICC line.   We strive to give you quality time with your provider. You may need to reschedule your appointment if you arrive late (15 or more minutes).  Arriving late affects you and other patients whose appointments are after yours.  Also, if you miss three or more appointments without notifying the office, you may be dismissed from the clinic at the provider's discretion.      For prescription refill requests, have your pharmacy contact our office and allow 72 hours for refills to be completed.    Today you received the following:  Feraheme.  Ferumoxytol Injection What is this medication? FERUMOXYTOL (FER ue MOX i tol) treats low levels of iron in your body (iron deficiency anemia). Iron is a mineral that plays an important role in making red blood cells, which carry oxygen from your lungs to the rest of your body. This medicine may be used for other purposes; ask your health care provider or pharmacist if you have questions. COMMON BRAND NAME(S): Feraheme What should I tell my care team before I take this medication? They need to know if you have any of these conditions: Anemia not caused by low iron levels High levels of iron in the blood Magnetic resonance imaging (MRI) test scheduled An unusual or allergic reaction to iron, other medications, foods, dyes, or preservatives Pregnant or trying to get pregnant Breastfeeding How should I  use this medication? This medication is injected into a vein. It is given by your care team in a hospital or clinic setting. Talk to your care team the use of this medication in children. Special care may be needed. Overdosage: If you think you have taken too much of this medicine contact a poison control center or emergency room at once. NOTE: This medicine is only for you. Do not share this medicine with others. What if I miss a dose? It is important not to miss your dose. Call your care team if you are unable to keep an appointment. What may interact with this medication? Other iron products This list may not describe all possible interactions. Give your health care provider a list of all the medicines, herbs, non-prescription drugs, or dietary supplements you use. Also tell them if you smoke, drink alcohol, or use illegal drugs. Some items may interact with your medicine. What should I watch for while using this medication? Visit your care team regularly. Tell your care team if your symptoms do not start to get better or if they get worse. You may need blood work done while you are taking this medication. You may need to follow a special diet. Talk to your care team. Foods that contain iron include: whole grains/cereals, dried fruits, beans, or peas, leafy green vegetables, and organ meats (liver, kidney). What side effects may I notice from receiving this medication? Side effects that you should report to your care team as soon as  possible: Allergic reactions--skin rash, itching, hives, swelling of the face, lips, tongue, or throat Low blood pressure--dizziness, feeling faint or lightheaded, blurry vision Shortness of breath Side effects that usually do not require medical attention (report to your care team if they continue or are bothersome): Flushing Headache Joint pain Muscle pain Nausea Pain, redness, or irritation at injection site This list may not describe all possible side  effects. Call your doctor for medical advice about side effects. You may report side effects to FDA at 1-800-FDA-1088. Where should I keep my medication? This medication is given in a hospital or clinic. It will not be stored at home. NOTE: This sheet is a summary. It may not cover all possible information. If you have questions about this medicine, talk to your doctor, pharmacist, or health care provider.  2024 Elsevier/Gold Standard (2023-02-16 00:00:00)     To help prevent nausea and vomiting after your treatment, we encourage you to take your nausea medication as directed.  BELOW ARE SYMPTOMS THAT SHOULD BE REPORTED IMMEDIATELY: *FEVER GREATER THAN 100.4 F (38 C) OR HIGHER *CHILLS OR SWEATING *NAUSEA AND VOMITING THAT IS NOT CONTROLLED WITH YOUR NAUSEA MEDICATION *UNUSUAL SHORTNESS OF BREATH *UNUSUAL BRUISING OR BLEEDING *URINARY PROBLEMS (pain or burning when urinating, or frequent urination) *BOWEL PROBLEMS (unusual diarrhea, constipation, pain near the anus) TENDERNESS IN MOUTH AND THROAT WITH OR WITHOUT PRESENCE OF ULCERS (sore throat, sores in mouth, or a toothache) UNUSUAL RASH, SWELLING OR PAIN  UNUSUAL VAGINAL DISCHARGE OR ITCHING   Items with * indicate a potential emergency and should be followed up as soon as possible or go to the Emergency Department if any problems should occur.  Please show the CHEMOTHERAPY ALERT CARD or IMMUNOTHERAPY ALERT CARD at check-in to the Emergency Department and triage nurse.  Should you have questions after your visit or need to cancel or reschedule your appointment, please contact [facility] [place] - A DEPT OF Eligha Bridegroom [facility] [phone]  and follow the prompts.  Office hours are 8:00 a.m. to 4:30 p.m. Monday - Friday. Please note that voicemails left after 4:00 p.m. may not be returned until the following business day.  We are closed weekends and major holidays. You have access to a nurse at all times for urgent  questions. Please call the main number to the clinic [phone] and follow the prompts.  For any non-urgent questions, you may also contact your provider using MyChart. We now offer e-Visits for anyone 15 and older to request care online for non-urgent symptoms. For details visit [email or web address].   Also download the MyChart app! Go to the app store, search "MyChart", open the app, select [place], and log in with your MyChart username and password.

## 2023-09-07 ENCOUNTER — Other Ambulatory Visit: Payer: Self-pay | Admitting: *Deleted

## 2023-09-07 ENCOUNTER — Inpatient Hospital Stay: Payer: Medicare Other

## 2023-09-07 VITALS — BP 142/78 | HR 64 | Temp 97.2°F | Resp 18

## 2023-09-07 DIAGNOSIS — K7460 Unspecified cirrhosis of liver: Secondary | ICD-10-CM

## 2023-09-07 DIAGNOSIS — R0602 Shortness of breath: Secondary | ICD-10-CM | POA: Diagnosis not present

## 2023-09-07 DIAGNOSIS — I4891 Unspecified atrial fibrillation: Secondary | ICD-10-CM | POA: Diagnosis not present

## 2023-09-07 DIAGNOSIS — R5383 Other fatigue: Secondary | ICD-10-CM | POA: Diagnosis not present

## 2023-09-07 DIAGNOSIS — Z7901 Long term (current) use of anticoagulants: Secondary | ICD-10-CM | POA: Diagnosis not present

## 2023-09-07 DIAGNOSIS — D696 Thrombocytopenia, unspecified: Secondary | ICD-10-CM | POA: Diagnosis not present

## 2023-09-07 DIAGNOSIS — D509 Iron deficiency anemia, unspecified: Secondary | ICD-10-CM | POA: Diagnosis not present

## 2023-09-07 MED ORDER — ACETAMINOPHEN 325 MG PO TABS
650.0000 mg | ORAL_TABLET | Freq: Once | ORAL | Status: AC
  Administered 2023-09-07: 650 mg via ORAL
  Filled 2023-09-07: qty 2

## 2023-09-07 MED ORDER — SODIUM CHLORIDE 0.9 % IV SOLN
510.0000 mg | Freq: Once | INTRAVENOUS | Status: AC
  Administered 2023-09-07: 510 mg via INTRAVENOUS
  Filled 2023-09-07: qty 510

## 2023-09-07 MED ORDER — CETIRIZINE HCL 10 MG PO TABS
10.0000 mg | ORAL_TABLET | Freq: Once | ORAL | Status: AC
  Administered 2023-09-07: 10 mg via ORAL
  Filled 2023-09-07: qty 1

## 2023-09-07 MED ORDER — SODIUM CHLORIDE 0.9 % IV SOLN
Freq: Once | INTRAVENOUS | Status: AC
Start: 2023-09-07 — End: 2023-09-07

## 2023-09-07 NOTE — Progress Notes (Signed)
Patient tolerated iron infusion with no complaints voiced.  Peripheral IV site clean and dry with good blood return noted before and after infusion.  Band aid applied.  Pt observed 30 minutes post iron infusion with no complications. VSS with discharge and left in satisfactory condition with no s/s of distress noted.   Linda Grandmaison [name] [employer]

## 2023-09-07 NOTE — Patient Instructions (Signed)
 Ferumoxytol Injection What is this medication? FERUMOXYTOL (FER ue MOX i tol) treats low levels of iron in your body (iron deficiency anemia). Iron is a mineral that plays an important role in making red blood cells, which carry oxygen from your lungs to the rest of your body. This medicine may be used for other purposes; ask your health care provider or pharmacist if you have questions. COMMON BRAND NAME(S): Feraheme What should I tell my care team before I take this medication? They need to know if you have any of these conditions: Anemia not caused by low iron levels High levels of iron in the blood Magnetic resonance imaging (MRI) test scheduled An unusual or allergic reaction to iron, other medications, foods, dyes, or preservatives Pregnant or trying to get pregnant Breastfeeding How should I use this medication? This medication is injected into a vein. It is given by your care team in a hospital or clinic setting. Talk to your care team the use of this medication in children. Special care may be needed. Overdosage: If you think you have taken too much of this medicine contact a poison control center or emergency room at once. NOTE: This medicine is only for you. Do not share this medicine with others. What if I miss a dose? It is important not to miss your dose. Call your care team if you are unable to keep an appointment. What may interact with this medication? Other iron products This list may not describe all possible interactions. Give your health care provider a list of all the medicines, herbs, non-prescription drugs, or dietary supplements you use. Also tell them if you smoke, drink alcohol, or use illegal drugs. Some items may interact with your medicine. What should I watch for while using this medication? Visit your care team regularly. Tell your care team if your symptoms do not start to get better or if they get worse. You may need blood work done while you are taking this  medication. You may need to follow a special diet. Talk to your care team. Foods that contain iron include: whole grains/cereals, dried fruits, beans, or peas, leafy green vegetables, and organ meats (liver, kidney). What side effects may I notice from receiving this medication? Side effects that you should report to your care team as soon as possible: Allergic reactions--skin rash, itching, hives, swelling of the face, lips, tongue, or throat Low blood pressure--dizziness, feeling faint or lightheaded, blurry vision Shortness of breath Side effects that usually do not require medical attention (report to your care team if they continue or are bothersome): Flushing Headache Joint pain Muscle pain Nausea Pain, redness, or irritation at injection site This list may not describe all possible side effects. Call your doctor for medical advice about side effects. You may report side effects to FDA at 1-800-FDA-1088. Where should I keep my medication? This medication is given in a hospital or clinic. It will not be stored at home. NOTE: This sheet is a summary. It may not cover all possible information. If you have questions about this medicine, talk to your doctor, pharmacist, or health care provider.  2024 Elsevier/Gold Standard (2023-02-16 00:00:00)

## 2023-09-17 ENCOUNTER — Other Ambulatory Visit (HOSPITAL_COMMUNITY)
Admission: RE | Admit: 2023-09-17 | Discharge: 2023-09-17 | Disposition: A | Discharge status: Home / Self Care | Payer: Medicare Other | Source: Ambulatory Visit | Attending: Gastroenterology | Admitting: Gastroenterology

## 2023-09-17 DIAGNOSIS — K7460 Unspecified cirrhosis of liver: Secondary | ICD-10-CM | POA: Diagnosis not present

## 2023-09-17 LAB — COMPREHENSIVE METABOLIC PANEL
ALT: 28 U/L (ref 0–44)
AST: 36 U/L (ref 15–41)
Albumin: 3.3 g/dL — ABNORMAL LOW (ref 3.5–5.0)
Alkaline Phosphatase: 90 U/L (ref 38–126)
Anion gap: 5 (ref 5–15)
BUN: 15 mg/dL (ref 8–23)
CO2: 30 mmol/L (ref 22–32)
Calcium: 9.1 mg/dL (ref 8.9–10.3)
Chloride: 104 mmol/L (ref 98–111)
Creatinine, Ser: 0.8 mg/dL (ref 0.44–1.00)
GFR, Estimated: >60 mL/min (ref >60)
Glucose, Bld: 189 mg/dL — ABNORMAL HIGH (ref 70–99)
Potassium: 3.8 mmol/L (ref 3.5–5.1)
Sodium: 139 mmol/L (ref 135–145)
Total Bilirubin: 1.5 mg/dL — ABNORMAL HIGH (ref <1.2)
Total Protein: 6.1 g/dL — ABNORMAL LOW (ref 6.5–8.1)

## 2023-09-17 LAB — CBC WITH DIFFERENTIAL/PLATELET
Abs Immature Granulocytes: 0.01 10*3/uL (ref 0.00–0.07)
Basophils Absolute: 0 10*3/uL (ref 0.0–0.1)
Basophils Relative: 0 %
Eosinophils Absolute: 0.1 10*3/uL (ref 0.0–0.5)
Eosinophils Relative: 3 %
HCT: 40.4 % (ref 36.0–46.0)
Hemoglobin: 13 g/dL (ref 12.0–15.0)
Immature Granulocytes: 0 %
Lymphocytes Relative: 17 %
Lymphs Abs: 0.6 10*3/uL — ABNORMAL LOW (ref 0.7–4.0)
MCH: 34 pg (ref 26.0–34.0)
MCHC: 32.2 g/dL (ref 30.0–36.0)
MCV: 105.8 fL — ABNORMAL HIGH (ref 80.0–100.0)
Monocytes Absolute: 0.2 10*3/uL (ref 0.1–1.0)
Monocytes Relative: 6 %
Neutro Abs: 2.7 10*3/uL (ref 1.7–7.7)
Neutrophils Relative %: 74 %
Platelets: 85 10*3/uL — ABNORMAL LOW (ref 150–400)
RBC: 3.82 MIL/uL — ABNORMAL LOW (ref 3.87–5.11)
RDW: 14.6 % (ref 11.5–15.5)
WBC: 3.6 10*3/uL — ABNORMAL LOW (ref 4.0–10.5)
nRBC: 0 % (ref 0.0–0.2)

## 2023-09-17 LAB — PROTIME-INR
INR: 1.4 — ABNORMAL HIGH (ref 0.8–1.2)
Prothrombin Time: 17.2 s — ABNORMAL HIGH (ref 11.4–15.2)

## 2023-09-18 ENCOUNTER — Ambulatory Visit (HOSPITAL_COMMUNITY)
Admission: RE | Admit: 2023-09-18 | Discharge: 2023-09-18 | Disposition: A | Discharge status: Home / Self Care | Payer: Medicare Other | Source: Ambulatory Visit | Attending: Gastroenterology | Admitting: Gastroenterology

## 2023-09-18 DIAGNOSIS — Z9049 Acquired absence of other specified parts of digestive tract: Secondary | ICD-10-CM | POA: Diagnosis not present

## 2023-09-18 DIAGNOSIS — K7460 Unspecified cirrhosis of liver: Secondary | ICD-10-CM | POA: Insufficient documentation

## 2023-09-20 ENCOUNTER — Other Ambulatory Visit: Payer: Self-pay | Admitting: *Deleted

## 2023-09-20 DIAGNOSIS — K7460 Unspecified cirrhosis of liver: Secondary | ICD-10-CM

## 2023-09-20 DIAGNOSIS — D509 Iron deficiency anemia, unspecified: Secondary | ICD-10-CM

## 2023-09-28 ENCOUNTER — Encounter: Payer: Medicare Other | Admitting: Cardiovascular Disease

## 2023-10-04 NOTE — Progress Notes (Signed)
   PCP: Rosamond Leta NOVAK, MD   Primary EP: Dr Nancey Rollene CINDERELLA Linda [name] is a 78 y.o. female who presents today for routine electrophysiology followup.  Since last being seen in our clinic, the patient reports doing very well.  Today, she denies symptoms of palpitations, chest pain, shortness of breath,  lower extremity edema, dizziness, presyncope, or syncope.  The patient is otherwise without complaint today.   she has no device related complaints -- no new tenderness, drainage, redness.    Physical Exam: Vitals:   10/05/23 1052  BP: 138/80  Pulse: 92  Weight: 228 lb (103.4 kg)  Height: 5' 2.5 (1.588 m)      Gen: Appears comfortable, well-nourished CV: RRR, no dependent edema The device site is normal -- no tenderness, edema, drainage, redness, threatened erosion.  Pulm: breathing easily   Wt Readings from Last 3 Encounters:  10/05/23 228 lb (103.4 kg)  08/14/23 226 lb 10.1 oz (102.8 kg)  04/05/23 222 lb (100.7 kg)     Assessment and Plan:  Persistent afib Appears well controlled post ablation  Continue eliquis  5mg  BID - labs 10/14/24 reviewed ILR is dead   We again discussed ILR removal. She does not wish to have her ILR removed at this time.  She is aware to contact my office to arrange removal of her ILR with me in [facility] office should she change her mind. I advised her to monitor her heart rate daily  2. OSA Uses BiPAP  3. Obesity Body mass index is 41.04 kg/m. Lifestyle modification advised  4. HL Stable No change required today  Risks, benefits and potential toxicities for medications prescribed and/or refilled reviewed with patient today.   Return in a year  Eulas FORBES Nancey, MD 10/05/2023 11:22 AM

## 2023-10-05 ENCOUNTER — Ambulatory Visit: Payer: Medicare Other | Attending: Cardiovascular Disease | Admitting: Cardiovascular Disease

## 2023-10-05 ENCOUNTER — Encounter: Payer: Self-pay | Admitting: Cardiovascular Disease

## 2023-10-05 VITALS — BP 138/80 | HR 92 | Ht 62.5 in | Wt 228.0 lb

## 2023-10-05 DIAGNOSIS — I4819 Other persistent atrial fibrillation: Secondary | ICD-10-CM | POA: Diagnosis not present

## 2023-10-05 NOTE — Patient Instructions (Addendum)

## 2023-10-17 DIAGNOSIS — Z299 Encounter for prophylactic measures, unspecified: Secondary | ICD-10-CM | POA: Diagnosis not present

## 2023-10-17 DIAGNOSIS — E1159 Type 2 diabetes mellitus with other circulatory complications: Secondary | ICD-10-CM | POA: Diagnosis not present

## 2023-10-17 DIAGNOSIS — I152 Hypertension secondary to endocrine disorders: Secondary | ICD-10-CM | POA: Diagnosis not present

## 2023-10-17 DIAGNOSIS — E1140 Type 2 diabetes mellitus with diabetic neuropathy, unspecified: Secondary | ICD-10-CM | POA: Diagnosis not present

## 2023-10-17 DIAGNOSIS — D696 Thrombocytopenia, unspecified: Secondary | ICD-10-CM | POA: Diagnosis not present

## 2023-10-17 DIAGNOSIS — I10 Essential (primary) hypertension: Secondary | ICD-10-CM | POA: Diagnosis not present

## 2023-12-03 DIAGNOSIS — H35372 Puckering of macula, left eye: Secondary | ICD-10-CM | POA: Diagnosis not present

## 2023-12-03 DIAGNOSIS — H1820 Unspecified corneal edema: Secondary | ICD-10-CM | POA: Diagnosis not present

## 2023-12-10 DIAGNOSIS — R059 Cough, unspecified: Secondary | ICD-10-CM | POA: Diagnosis not present

## 2023-12-10 DIAGNOSIS — R070 Pain in throat: Secondary | ICD-10-CM | POA: Diagnosis not present

## 2023-12-10 DIAGNOSIS — Z299 Encounter for prophylactic measures, unspecified: Secondary | ICD-10-CM | POA: Diagnosis not present

## 2023-12-10 DIAGNOSIS — J069 Acute upper respiratory infection, unspecified: Secondary | ICD-10-CM | POA: Diagnosis not present

## 2023-12-14 DIAGNOSIS — I10 Essential (primary) hypertension: Secondary | ICD-10-CM | POA: Diagnosis not present

## 2023-12-14 DIAGNOSIS — J449 Chronic obstructive pulmonary disease, unspecified: Secondary | ICD-10-CM | POA: Diagnosis not present

## 2023-12-14 DIAGNOSIS — H9201 Otalgia, right ear: Secondary | ICD-10-CM | POA: Diagnosis not present

## 2023-12-14 DIAGNOSIS — I700 Atherosclerosis of aorta: Secondary | ICD-10-CM | POA: Diagnosis not present

## 2023-12-14 DIAGNOSIS — Z299 Encounter for prophylactic measures, unspecified: Secondary | ICD-10-CM | POA: Diagnosis not present

## 2023-12-14 DIAGNOSIS — I4891 Unspecified atrial fibrillation: Secondary | ICD-10-CM | POA: Diagnosis not present

## 2023-12-18 DIAGNOSIS — H6123 Impacted cerumen, bilateral: Secondary | ICD-10-CM | POA: Diagnosis not present

## 2023-12-18 DIAGNOSIS — Z299 Encounter for prophylactic measures, unspecified: Secondary | ICD-10-CM | POA: Diagnosis not present

## 2023-12-18 DIAGNOSIS — I10 Essential (primary) hypertension: Secondary | ICD-10-CM | POA: Diagnosis not present

## 2023-12-18 DIAGNOSIS — H6690 Otitis media, unspecified, unspecified ear: Secondary | ICD-10-CM | POA: Diagnosis not present

## 2024-01-17 DIAGNOSIS — Z299 Encounter for prophylactic measures, unspecified: Secondary | ICD-10-CM | POA: Diagnosis not present

## 2024-01-17 DIAGNOSIS — F322 Major depressive disorder, single episode, severe without psychotic features: Secondary | ICD-10-CM | POA: Diagnosis not present

## 2024-01-17 DIAGNOSIS — E1165 Type 2 diabetes mellitus with hyperglycemia: Secondary | ICD-10-CM | POA: Diagnosis not present

## 2024-01-17 DIAGNOSIS — I10 Essential (primary) hypertension: Secondary | ICD-10-CM | POA: Diagnosis not present

## 2024-01-17 DIAGNOSIS — D692 Other nonthrombocytopenic purpura: Secondary | ICD-10-CM | POA: Diagnosis not present

## 2024-01-31 ENCOUNTER — Encounter: Payer: Self-pay | Admitting: Gastroenterology

## 2024-02-05 ENCOUNTER — Inpatient Hospital Stay: Payer: Medicare Other

## 2024-02-07 ENCOUNTER — Other Ambulatory Visit: Payer: Self-pay | Admitting: *Deleted

## 2024-02-07 ENCOUNTER — Telehealth: Payer: Self-pay | Admitting: *Deleted

## 2024-02-07 ENCOUNTER — Inpatient Hospital Stay: Attending: Physician Assistant

## 2024-02-07 DIAGNOSIS — R5383 Other fatigue: Secondary | ICD-10-CM | POA: Diagnosis not present

## 2024-02-07 DIAGNOSIS — D509 Iron deficiency anemia, unspecified: Secondary | ICD-10-CM | POA: Insufficient documentation

## 2024-02-07 DIAGNOSIS — Z87891 Personal history of nicotine dependence: Secondary | ICD-10-CM | POA: Insufficient documentation

## 2024-02-07 DIAGNOSIS — R0602 Shortness of breath: Secondary | ICD-10-CM | POA: Diagnosis not present

## 2024-02-07 DIAGNOSIS — I4891 Unspecified atrial fibrillation: Secondary | ICD-10-CM | POA: Diagnosis not present

## 2024-02-07 DIAGNOSIS — R911 Solitary pulmonary nodule: Secondary | ICD-10-CM | POA: Diagnosis not present

## 2024-02-07 DIAGNOSIS — D696 Thrombocytopenia, unspecified: Secondary | ICD-10-CM | POA: Insufficient documentation

## 2024-02-07 DIAGNOSIS — E538 Deficiency of other specified B group vitamins: Secondary | ICD-10-CM | POA: Diagnosis not present

## 2024-02-07 DIAGNOSIS — G479 Sleep disorder, unspecified: Secondary | ICD-10-CM | POA: Diagnosis not present

## 2024-02-07 DIAGNOSIS — Z7901 Long term (current) use of anticoagulants: Secondary | ICD-10-CM | POA: Diagnosis not present

## 2024-02-07 DIAGNOSIS — K7460 Unspecified cirrhosis of liver: Secondary | ICD-10-CM

## 2024-02-07 DIAGNOSIS — D500 Iron deficiency anemia secondary to blood loss (chronic): Secondary | ICD-10-CM

## 2024-02-07 DIAGNOSIS — R519 Headache, unspecified: Secondary | ICD-10-CM | POA: Diagnosis not present

## 2024-02-07 LAB — CBC WITH DIFFERENTIAL/PLATELET
Abs Immature Granulocytes: 0.01 10*3/uL (ref 0.00–0.07)
Basophils Absolute: 0 10*3/uL (ref 0.0–0.1)
Basophils Relative: 0 %
Eosinophils Absolute: 0.1 10*3/uL (ref 0.0–0.5)
Eosinophils Relative: 2 %
HCT: 39.8 % (ref 36.0–46.0)
Hemoglobin: 12.7 g/dL (ref 12.0–15.0)
Immature Granulocytes: 0 %
Lymphocytes Relative: 20 %
Lymphs Abs: 1.2 10*3/uL (ref 0.7–4.0)
MCH: 32.9 pg (ref 26.0–34.0)
MCHC: 31.9 g/dL (ref 30.0–36.0)
MCV: 103.1 fL — ABNORMAL HIGH (ref 80.0–100.0)
Monocytes Absolute: 0.4 10*3/uL (ref 0.1–1.0)
Monocytes Relative: 6 %
Neutro Abs: 4.1 10*3/uL (ref 1.7–7.7)
Neutrophils Relative %: 72 %
Platelets: 105 10*3/uL — ABNORMAL LOW (ref 150–400)
RBC: 3.86 MIL/uL — ABNORMAL LOW (ref 3.87–5.11)
RDW: 13.8 % (ref 11.5–15.5)
WBC: 5.8 10*3/uL (ref 4.0–10.5)
nRBC: 0 % (ref 0.0–0.2)

## 2024-02-07 LAB — IRON AND TIBC
Iron: 73 ug/dL (ref 28–170)
Saturation Ratios: 21 % (ref 10.4–31.8)
TIBC: 357 ug/dL (ref 250–450)
UIBC: 284 ug/dL

## 2024-02-07 LAB — COMPREHENSIVE METABOLIC PANEL WITH GFR
ALT: 27 U/L (ref 0–44)
AST: 30 U/L (ref 15–41)
Albumin: 3.3 g/dL — ABNORMAL LOW (ref 3.5–5.0)
Alkaline Phosphatase: 89 U/L (ref 38–126)
Anion gap: 7 (ref 5–15)
BUN: 13 mg/dL (ref 8–23)
CO2: 30 mmol/L (ref 22–32)
Calcium: 9.3 mg/dL (ref 8.9–10.3)
Chloride: 100 mmol/L (ref 98–111)
Creatinine, Ser: 0.8 mg/dL (ref 0.44–1.00)
GFR, Estimated: >60 mL/min (ref >60)
Glucose, Bld: 215 mg/dL — ABNORMAL HIGH (ref 70–99)
Potassium: 4.2 mmol/L (ref 3.5–5.1)
Sodium: 137 mmol/L (ref 135–145)
Total Bilirubin: 1.5 mg/dL — ABNORMAL HIGH (ref 0.0–1.2)
Total Protein: 6.6 g/dL (ref 6.5–8.1)

## 2024-02-07 LAB — FERRITIN: Ferritin: 37 ng/mL (ref 11–307)

## 2024-02-07 NOTE — Telephone Encounter (Signed)
 Pt left vm stating she received letter to schedule RUQ US .   Advised pt that US  is scheduled for Tuesday 02/19/24, arrive at 8:15 am, NPO after midnight.

## 2024-02-11 NOTE — Progress Notes (Addendum)
 [facility] [address], [place] [address]   CLINIC:  Medical Oncology/Hematology  PCP:  Orlena Bitters, MD [address] [place] [address] [phone]   REASON FOR VISIT:  Follow-up for iron deficiency anemia and thrombocytopenia   CURRENT THERAPY: Intermittent IV iron  INTERVAL HISTORY:   [name] 78 y.o. female returns for routine follow-up of iron deficiency anemia and thrombocytopenia.  She was last seen by Sheril Dines PA-C on 08/14/2023.  She reports feeling improved energy after IV iron in December 2024, but only lasted about a week.  Energy today is low.   She denies any bright red blood per rectum, melena, or epistaxis.  She continues to bruise easily but denies any petechial rash.  She is on Eliquis  for atrial fibrillation.    She has headaches and lightheadedness.  She denies any chest pain, or syncope.  She has chronic shortness of breath with exertion, which is at baseline.  No B symptoms such as fever, chills, night sweats, or unintentional weight loss.  She remains on her iron tablet every other day.   She has 20% energy and 50% appetite. She endorses that she is maintaining a stable weight.  She enjoys keeping busy, and is taking a crochet class.  ASSESSMENT & PLAN:  1.  Iron deficiency anemia: - Colonoscopy on 04/08/2019 with normal ileum, moderate diverticulosis in the rectosigmoid colon, external and internal hemorrhoids.  Pathology consistent with tubular adenomas. - EGD (03/20/2022): Gastritis.  No esophageal varices. 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  Normal creatinine.  Ferritin 37, iron saturation 21%.  - PLAN:  Recommend IV Feraheme  x 2 due to symptomatic iron deficiency.   - Continue iron tablet every other day. - CBC and iron panel with RTC in 6 months, or sooner if needed based on symptoms  - Continue follow-up with gastroenterology   2.  Thrombocytopenia - Mild thrombocytopenia since 2018 - CBC on 09/23/2020 did note some platelet clumping could be skewing the counts - CT scan of the chest on 01/05/2017 at [facility] showed borderline splenomegaly.  No comment on the liver. - Abdominal ultrasound (08/04/2021): Heterogeneous liver with slightly nodular contour suspicious for either diffuse hepatocellular disease or cirrhosis; mild splenomegaly 575 cc. - She follows with [facility] for cirrhosis - She admits to easy bruising but denies any petechial rash or abnormal bleeding events. 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  May also have some element of ITP.  Less likely would be early myelodysplasia. - PLAN: Continue surveillance - If any significant deviation from baseline, would consider bone marrow biopsy.  3.  Vitamin B12 deficiency - Previously taking vitamin B12 supplements, but these were stopped at last visit due to vitamin B12 >2000.  (Normal folate when checked in May 2023).  However, PCP restarted her B12 supplement. - PLAN: Defer management of B12 deficiency to PCP  3.  Atrial fibrillation: - Continue Eliquis  twice daily.    5.  Pulmonary nodule - Patient is following with PCP (Dr. Darien Eden)  PLAN SUMMARY: >> IV Feraheme  x2  >> Labs in 6 months = CBC/D, ferritin, iron/TIBC >> OFFICE visit in 6 months, 1 week after labs     REVIEW OF SYSTEMS:   Review of Systems  Constitutional:  Positive for fatigue. Negative for appetite change, chills,  diaphoresis, fever and unexpected weight change.  HENT:   Negative for lump/mass and nosebleeds.   Eyes:  Negative for eye problems.  Respiratory:  Positive for shortness of breath (with exertion). Negative for cough and hemoptysis.   Cardiovascular:  Positive for palpitations. Negative for chest pain and leg swelling.  Gastrointestinal:  Negative for abdominal pain, blood in stool, constipation, diarrhea, nausea and vomiting.  Genitourinary:  Negative for hematuria.   Skin: Negative.   Neurological:  Positive for dizziness, headaches and numbness. Negative for light-headedness.  Hematological:  Does not bruise/bleed easily.  Psychiatric/Behavioral:  Positive for sleep disturbance. Negative for depression. The patient is not nervous/anxious.      PHYSICAL EXAM:  ECOG PERFORMANCE STATUS: 1 - Symptomatic but completely ambulatory  Vitals:   02/12/24 1300 02/12/24 1305  BP: (!) 154/63 (!) 146/67  Pulse: 69   Resp: 18   Temp: 97.8 F (36.6 C)   SpO2: 97%     Filed Weights   02/12/24 1300  Weight: 226 lb 1.6 oz (102.6 kg)    Physical Exam Constitutional:      Appearance: Normal appearance. She is obese.  Cardiovascular:     Heart sounds: Normal heart sounds.  Pulmonary:     Breath sounds: Normal breath sounds.  Neurological:     General: No focal deficit present.     Mental Status: Mental status is at baseline.  Psychiatric:        Behavior: Behavior normal. Behavior is cooperative.     PAST MEDICAL/SURGICAL HISTORY:  Past Medical History:  Diagnosis Date   Allergy    Anemia 07/25/2017   Anxiety    Arthritis    Atrial fibrillation [facility])    s/p ablation in March 2022, ILP remains in place   Cataract    COPD (chronic obstructive pulmonary disease) (HCC)    Depression    Diabetes mellitus without complication (HCC)    Emphysema of lung (HCC)    GERD (gastroesophageal reflux disease) 10/28/2020   Hyperlipidemia    Hypertension    Oxygen  deficiency    Sleep apnea     Typical atrial flutter (HCC)    Past Surgical History:  Procedure Laterality Date   A-FLUTTER ABLATION N/A 10/04/2017   Procedure: A-FLUTTER ABLATION;  Surgeon: Jolly Needle, MD;  [facility]: [facility] INV[facility];  Service: Cardiovascular;  Laterality: N/A;   ABDOMINAL HYSTERECTOMY     fibroids   ATRIAL FIBRILLATION ABLATION N/A 12/09/2020   Procedure: ATRIAL FIBRILLATION ABLATION;  Surgeon: Jolly Needle, MD;  Location: [facility]VAS[facility] CV LAB;  Service: Cardiovascular;  Laterality: N/A;   BIOPSY  03/20/2022   Procedure: BIOPSY;  Surgeon: Vinetta Greening, DO;  Location: [facility] ENDO [facility];  Service: Endoscopy;;   blood clot removal from base of brain  1990   CARDIOVERSION N/A 10/04/2020   Procedure: CARDIOVERSION;  Surgeon: Sonny Dust, MD;  Location: [facility] ENDOSCOPY;  Service: Cardiovascular;  Laterality: N/A;   CARPAL TUNNEL RELEASE     CATARACT EXTRACTION W/PHACO Left 06/16/2013   Procedure: CATARACT EXTRACTION PHACO AND INTRAOCULAR LENS PLACEMENT (IOC);  Surgeon: Anner Kill, MD;  Location: [facility] ORS;  Service: Ophthalmology;  Laterality: Left;  CDE:  12.18   CATARACT EXTRACTION W/PHACO Right 06/26/2013   Procedure: CATARACT EXTRACTION PHACO AND INTRAOCULAR LENS PLACEMENT (IOC);  Surgeon: Anner Kill, MD;  Location: [facility] ORS;  Service: Ophthalmology;  Laterality: Right;  CDE:14.71   CESAREAN SECTION     CHOLECYSTECTOMY     COLONOSCOPY WITH PROPOFOL  N/A 04/08/2019   Dr.Fields: diverticulosis, 11 simple adenomas removed, hemorrhoids. next colonoscopy in 3 years.    COLONOSCOPY WITH PROPOFOL  N/A 03/20/2022   Procedure: COLONOSCOPY WITH PROPOFOL ;  Surgeon: Vinetta Greening, DO;  Location: [facility];  Service: Endoscopy;  Laterality: N/A;  9:45am   DILATION AND CURETTAGE OF UTERUS     ESOPHAGOGASTRODUODENOSCOPY (EGD) WITH PROPOFOL  N/A 04/08/2019   Dr. Nolene Baumgarten: H.pylori gastritis   ESOPHAGOGASTRODUODENOSCOPY (EGD) WITH PROPOFOL  N/A 03/20/2022   Procedure: ESOPHAGOGASTRODUODENOSCOPY (EGD) WITH  PROPOFOL ;  Surgeon: Vinetta Greening, DO;  [facility];  Service: Endoscopy;  Laterality: N/A;   EYE SURGERY  12/2016   FOOT NEUROMA SURGERY Right    LOOP RECORDER INSERTION N/A 11/06/2017   Procedure: LOOP RECORDER INSERTION;  Surgeon: Jolly Needle, MD;  [facility];  Service: Cardiovascular;  Laterality: N/A;   MOUTH SURGERY     POLYPECTOMY  04/08/2019   Procedure: POLYPECTOMY;  Surgeon: Alyce Jubilee, MD;  [facility];  Service: Endoscopy;;   POLYPECTOMY  03/20/2022   Procedure: POLYPECTOMY;  Surgeon: Vinetta Greening, DO;  [facility];  Service: Endoscopy;;   TEE WITHOUT CARDIOVERSION N/A 12/09/2020   Procedure: TRANSESOPHAGEAL ECHOCARDIOGRAM (TEE);  Surgeon: Lenise Quince, MD;  Location: [facility] ENDOSCOPY;  Service: Cardiovascular;  Laterality: N/A;   TONSILLECTOMY      SOCIAL HISTORY:  Social History   Socioeconomic History   Marital status: Widowed    Spouse name: Not on file   Number of children: 1   Years of education: 68   Highest education level: Not on file  Occupational History   Occupation: retired    Comment: catering, Production manager  Tobacco Use   Smoking status: Former    Current packs/day: 0.00    Average packs/day: 1.5 packs/day for 42.7 years (64.0 ttl pk-yrs)    Types: Cigarettes    Start date: 09/25/1958    Quit date: 06/06/2001    Years since quitting: 22.7   Smokeless tobacco: Never  Vaping Use   Vaping status: Never Used  Substance and Sexual Activity   Alcohol  use: Not Currently    Comment: used to drink an occasional alcoholic beverage. No hyistory of heavy alcohol  use.   Drug use: Never   Sexual activity: Not Currently  Other Topics Concern   Not on file  Social History Narrative   Lives alone   Son lives in [address]   TV, reads, puzzles   Social Drivers of Health   Financial Resource Strain: Low Risk  (09/29/2020)   Received from [facility], [facility]   Overall Financial  Resource Strain (CARDIA)    Difficulty of Paying Living Expenses: Not hard at all  Food Insecurity: No Food Insecurity (09/29/2020)   Received from [facility], [facility]   Hunger Vital Sign    Worried About Running Out of Food in the Last Year: Never true    Ran Out of Food in the Last Year: Never true  Transportation Needs: No Transportation Needs (09/29/2020)   Received from [facility], [facility]   [facility] - Transportation  Lack of Transportation (Medical): No    Lack of Transportation (Non-Medical): No  Physical Activity: Not on file  Stress: Not on file  Social Connections: Not on file  Intimate Partner Violence: Not At Risk (09/27/2020)   Received from [facility], [facility]   Humiliation, Afraid, Rape, and Kick questionnaire    Fear of Current or Ex-Partner: No    Emotionally Abused: No    Physically Abused: No    Sexually Abused: No    FAMILY HISTORY:  Family History  Problem Relation Age of Onset   Emphysema Mother    Alcohol  abuse Mother    Arthritis Mother    COPD Mother    Depression Mother    Hyperlipidemia Mother    Hypertension Mother    Heart attack Father    Cancer Father        lung   Heart disease Father    Diabetes Brother    COPD Brother    Other Maternal Grandmother        swine flu 1919   Colon cancer Neg Hx    Colon polyps Neg Hx    Liver disease Neg Hx     CURRENT MEDICATIONS:  Outpatient Encounter Medications as of 02/12/2024  Medication Sig   acetaminophen  (TYLENOL ) 500 MG tablet Take 1,000 mg by mouth every 6 (six) hours as needed for moderate pain or headache.   ALPHA LIPOIC ACID PO Take 1 capsule by mouth daily.   apixaban  (ELIQUIS ) 5 MG TABS tablet Take 1 tablet (5 mg total) by mouth 2 (two) times daily.   Ascorbic Acid (VITAMIN C) 1000 MG tablet Take 1,000 mg by mouth daily.   busPIRone (BUSPAR) 5 MG tablet Take 5 mg by mouth 2 (two) times daily.   Calcium  Carbonate-Vit D-Min (CALCIUM  600+D3 PLUS  MINERALS) 600-800 MG-UNIT TABS Take 1 tablet by mouth daily.   cetirizine  (ZYRTEC ) 10 MG tablet Take 10 mg by mouth daily.   cholecalciferol (VITAMIN D3) 25 MCG (1000 UNIT) tablet Take 1,000 Units by mouth daily.   CINNAMON PO Take 2,400 mg by mouth at bedtime.   DULoxetine  (CYMBALTA ) 60 MG capsule Take 60 mg by mouth daily.   ferrous sulfate 325 (65 FE) MG EC tablet Take 325 mg by mouth every other day.   fluticasone-salmeterol (WIXELA INHUB) 250-50 MCG/ACT AEPB Inhale 1 puff into the lungs in the morning and at bedtime.   FREESTYLE LITE test strip    glipiZIDE (GLUCOTROL) 5 MG tablet Take 5 mg by mouth daily.   Melatonin 10 MG TABS Take 10 mg by mouth at bedtime.   metFORMIN  (GLUCOPHAGE ) 500 MG tablet Take 500 mg by mouth daily.   metoprolol  succinate (TOPROL -XL) 25 MG 24 hr tablet TAKE 1 TABLET DAILY (DOSE DECREASED 03/25/21)   Multiple Vitamin (MULTIVITAMIN WITH MINERALS) TABS tablet Take 1 tablet by mouth daily.   NON FORMULARY B Pap Machine   OXYGEN  Inhale 2 L into the lungs at bedtime. With BPAP   pantoprazole  (PROTONIX ) 40 MG tablet SMARTSIG:1 Tablet(s) By Mouth Every Evening   pravastatin  (PRAVACHOL ) 20 MG tablet Take 1 tablet (20 mg total) daily by mouth.   PROAIR  HFA 108 (90 Base) MCG/ACT inhaler Inhale 2 puffs into the lungs every 4 (four) hours as needed for wheezing or shortness of breath.   Probiotic Product (PROBIOTIC DAILY PO) Take 1 capsule by mouth daily.    Red Yeast Rice 600 MG TABS Take 1,200 mg by mouth daily.   triamcinolone cream (  KENALOG) 0.1 % Apply topically.   Zinc 30 MG CAPS Take 30 mg by mouth daily.   No facility-administered encounter medications on file as of 02/12/2024.    ALLERGIES:  Allergies  Allergen Reactions   Trazodone And Nefazodone Hives and Other (See Comments)    "blisters my skin"    Benicar [Olmesartan]     Pt does not remember    Nickel Other (See Comments)    "oozing"   Tetanus Toxoids Other (See Comments)    Left a knot   Adhesive  [Tape] Itching   Bactrim [Sulfamethoxazole-Trimethoprim] Itching and Rash   Penicillins Itching, Rash and Other (See Comments)    Has patient had a PCN reaction causing immediate rash, facial/tongue/throat swelling, SOB or lightheadedness with hypotension: Unknown Has patient had a PCN reaction causing severe rash involving mucus membranes or skin necrosis: Unknown Has patient had a PCN reaction that required hospitalization: No Has patient had a PCN reaction occurring within the last 10 years: No If all of the above answers are "NO", then may proceed with Cephalosporin use   Ppd [Tuberculin Purified Protein Derivative] Swelling and Other (See Comments)    Local arm swelling    LABORATORY DATA:  I have reviewed the labs as listed.  CBC    Component Value Date/Time   WBC 5.8 02/07/2024 1252   RBC 3.86 (L) 02/07/2024 1252   HGB 12.7 02/07/2024 1252   HGB 13.6 11/30/2020 0930   HCT 39.8 02/07/2024 1252   HCT 41.5 11/30/2020 0930   PLT 105 (L) 02/07/2024 1252   PLT 129 (L) 11/30/2020 0930   MCV 103.1 (H) 02/07/2024 1252   MCV 99 (H) 11/30/2020 0930   MCH 32.9 02/07/2024 1252   MCHC 31.9 02/07/2024 1252   RDW 13.8 02/07/2024 1252   RDW 13.3 11/30/2020 0930   LYMPHSABS 1.2 02/07/2024 1252   LYMPHSABS 1.0 11/30/2020 0930   MONOABS 0.4 02/07/2024 1252   EOSABS 0.1 02/07/2024 1252   EOSABS 0.1 11/30/2020 0930   BASOSABS 0.0 02/07/2024 1252   BASOSABS 0.0 11/30/2020 0930      Latest Ref Rng & Units 02/07/2024   12:52 PM 09/17/2023    8:47 AM 08/07/2023   12:47 PM  CMP  Glucose 70 - 99 mg/dL 130  865  784   BUN 8 - 23 mg/dL 13  15  20    Creatinine 0.44 - 1.00 mg/dL 6.96  2.95  2.84   Sodium 135 - 145 mmol/L 137  139  139   Potassium 3.5 - 5.1 mmol/L 4.2  3.8  3.9   Chloride 98 - 111 mmol/L 100  104  104   CO2 22 - 32 mmol/L 30  30  27    Calcium  8.9 - 10.3 mg/dL 9.3  9.1  9.7   Total Protein 6.5 - 8.1 g/dL 6.6  6.1  6.6   Total Bilirubin 0.0 - 1.2 mg/dL 1.5  1.5  1.5    Alkaline Phos 38 - 126 U/L 89  90  96   AST 15 - 41 U/L 30  36  34   ALT 0 - 44 U/L 27  28  27      DIAGNOSTIC IMAGING:  I have independently reviewed the relevant imaging and discussed with the patient.   WRAP UP:  All questions were answered. The patient knows to call the clinic with any problems, questions or concerns.  Medical decision making: Moderate  Time spent on visit: I spent 20 minutes counseling the patient  face to face. The total time spent in the appointment was 30 minutes and more than 50% was on counseling.  Sonnie Dusky, PA-C  02/12/24 1:36 PM

## 2024-02-12 ENCOUNTER — Inpatient Hospital Stay (HOSPITAL_BASED_OUTPATIENT_CLINIC_OR_DEPARTMENT_OTHER): Payer: Medicare Other | Admitting: Physician Assistant

## 2024-02-12 DIAGNOSIS — I4891 Unspecified atrial fibrillation: Secondary | ICD-10-CM | POA: Diagnosis not present

## 2024-02-12 DIAGNOSIS — D500 Iron deficiency anemia secondary to blood loss (chronic): Secondary | ICD-10-CM

## 2024-02-12 DIAGNOSIS — D696 Thrombocytopenia, unspecified: Secondary | ICD-10-CM

## 2024-02-12 DIAGNOSIS — Z7901 Long term (current) use of anticoagulants: Secondary | ICD-10-CM | POA: Diagnosis not present

## 2024-02-12 DIAGNOSIS — R519 Headache, unspecified: Secondary | ICD-10-CM | POA: Diagnosis not present

## 2024-02-12 DIAGNOSIS — D509 Iron deficiency anemia, unspecified: Secondary | ICD-10-CM | POA: Diagnosis not present

## 2024-02-12 DIAGNOSIS — R0602 Shortness of breath: Secondary | ICD-10-CM | POA: Diagnosis not present

## 2024-02-12 NOTE — Patient Instructions (Signed)
 [facility] at [facility] Discharge Instructions  You were seen today by Sheril Dines PA-C for your iron deficiency anemia and low platelets.  Your blood levels look great, but your iron levels are low. This may be playing a part in your fatigue, in addition to your liver disease and other chronic conditions. We will schedule you for IV iron x 2 doses.  Your platelet levels remain somewhat low, which is most likely related to your liver disease.  You do not need treatment at this time, but we will continue to monitor these levels going forward.  FOLLOW-UP APPOINTMENT: Labs and office visit in 6 months  ** Thank you for trusting me with your healthcare!  I strive to provide all of my patients with quality care at each visit.  If you receive a survey for this visit, I would be so grateful to you for taking the time to provide feedback.  Thank you in advance!  ~ Alantra Popoca                   Dr. Paulett Boros   &   Sheril Dines, PA-C   - - - - - - - - - - - - - - - - - -     Thank you for choosing [facility] at [facility] to provide your oncology and hematology care.  To afford each patient quality time with our provider, please arrive at least 15 minutes before your scheduled appointment time.   If you have a lab appointment with the Cancer Center please come in thru the Main Entrance and check in at the main information desk.  You need to re-schedule your appointment should you arrive 10 or more minutes late.  We strive to give you quality time with our providers, and arriving late affects you and other patients whose appointments are after yours.  Also, if you no show three or more times for appointments you may be dismissed from the clinic at the providers discretion.     Again, thank you for choosing [facility].  Our hope is that these requests will decrease the amount of time that you wait before being seen by our  physicians.       _____________________________________________________________  Should you have questions after your visit to [facility], please contact our office at [phone] and follow the prompts.  Our office hours are 8:00 a.m. and 4:30 p.m. Monday - Friday.  Please note that voicemails left after 4:00 p.m. may not be returned until the following business day.  We are closed weekends and major holidays.  You do have access to a nurse 24-7, just call the main number to the clinic [phone] and do not press any options, hold on the line and a nurse will answer the phone.    For prescription refill requests, have your pharmacy contact our office and allow 72 hours.    Due to Covid, you will need to wear a mask upon entering the hospital. If you do not have a mask, a mask will be given to you at the Main Entrance upon arrival. For doctor visits, patients may have 1 support person age 24 or older with them. For treatment visits, patients can not have anyone with them due to social distancing guidelines and our immunocompromised population.

## 2024-02-15 ENCOUNTER — Inpatient Hospital Stay

## 2024-02-15 VITALS — BP 143/58 | HR 68 | Temp 97.3°F | Resp 20

## 2024-02-15 DIAGNOSIS — D509 Iron deficiency anemia, unspecified: Secondary | ICD-10-CM

## 2024-02-15 DIAGNOSIS — R519 Headache, unspecified: Secondary | ICD-10-CM | POA: Diagnosis not present

## 2024-02-15 DIAGNOSIS — Z7901 Long term (current) use of anticoagulants: Secondary | ICD-10-CM | POA: Diagnosis not present

## 2024-02-15 DIAGNOSIS — I4891 Unspecified atrial fibrillation: Secondary | ICD-10-CM | POA: Diagnosis not present

## 2024-02-15 DIAGNOSIS — R0602 Shortness of breath: Secondary | ICD-10-CM | POA: Diagnosis not present

## 2024-02-15 DIAGNOSIS — D696 Thrombocytopenia, unspecified: Secondary | ICD-10-CM | POA: Diagnosis not present

## 2024-02-15 MED ORDER — CETIRIZINE HCL 10 MG PO TABS
10.0000 mg | ORAL_TABLET | Freq: Once | ORAL | Status: AC
  Administered 2024-02-15: 10 mg via ORAL
  Filled 2024-02-15: qty 1

## 2024-02-15 MED ORDER — ACETAMINOPHEN 325 MG PO TABS
650.0000 mg | ORAL_TABLET | Freq: Once | ORAL | Status: AC
  Administered 2024-02-15: 650 mg via ORAL
  Filled 2024-02-15: qty 2

## 2024-02-15 MED ORDER — SODIUM CHLORIDE 0.9 % IV SOLN
510.0000 mg | Freq: Once | INTRAVENOUS | Status: AC
  Administered 2024-02-15: 510 mg via INTRAVENOUS
  Filled 2024-02-15: qty 510

## 2024-02-15 MED ORDER — SODIUM CHLORIDE 0.9 % IV SOLN: Freq: Once | INTRAVENOUS | Status: AC

## 2024-02-15 NOTE — Progress Notes (Signed)
 Patient tolerated iron infusion with no complaints voiced.  Peripheral IV site clean and dry with good blood return noted before and after infusion.  Band aid applied.  VSS with discharge and left in satisfactory condition with no s/s of distress noted.

## 2024-02-15 NOTE — Patient Instructions (Signed)

## 2024-02-19 ENCOUNTER — Ambulatory Visit (HOSPITAL_COMMUNITY)
Admission: RE | Admit: 2024-02-19 | Discharge: 2024-02-19 | Disposition: A | Discharge status: Home / Self Care | Source: Ambulatory Visit | Attending: Gastroenterology | Admitting: Gastroenterology

## 2024-02-19 ENCOUNTER — Ambulatory Visit: Payer: Self-pay | Admitting: Gastroenterology

## 2024-02-19 DIAGNOSIS — K7460 Unspecified cirrhosis of liver: Secondary | ICD-10-CM | POA: Insufficient documentation

## 2024-02-19 DIAGNOSIS — K7689 Other specified diseases of liver: Secondary | ICD-10-CM | POA: Diagnosis not present

## 2024-02-22 ENCOUNTER — Inpatient Hospital Stay

## 2024-02-22 VITALS — BP 139/73 | HR 77 | Temp 97.6°F | Resp 18

## 2024-02-22 DIAGNOSIS — D696 Thrombocytopenia, unspecified: Secondary | ICD-10-CM | POA: Diagnosis not present

## 2024-02-22 DIAGNOSIS — R0602 Shortness of breath: Secondary | ICD-10-CM | POA: Diagnosis not present

## 2024-02-22 DIAGNOSIS — I4891 Unspecified atrial fibrillation: Secondary | ICD-10-CM | POA: Diagnosis not present

## 2024-02-22 DIAGNOSIS — D509 Iron deficiency anemia, unspecified: Secondary | ICD-10-CM

## 2024-02-22 DIAGNOSIS — R519 Headache, unspecified: Secondary | ICD-10-CM | POA: Diagnosis not present

## 2024-02-22 DIAGNOSIS — Z7901 Long term (current) use of anticoagulants: Secondary | ICD-10-CM | POA: Diagnosis not present

## 2024-02-22 MED ORDER — SODIUM CHLORIDE 0.9 % IV SOLN
510.0000 mg | Freq: Once | INTRAVENOUS | Status: AC
  Administered 2024-02-22: 510 mg via INTRAVENOUS
  Filled 2024-02-22: qty 510

## 2024-02-22 MED ORDER — SODIUM CHLORIDE 0.9 % IV SOLN: Freq: Once | INTRAVENOUS | Status: AC

## 2024-02-22 NOTE — Patient Instructions (Signed)

## 2024-02-22 NOTE — Progress Notes (Signed)
 Patient presents today for iron infusion.  Patient is in satisfactory condition with no new complaints voiced.  Vital signs are stable.  We will proceed with infusion per provider orders.    Patient took pre-meds at home prior to arrival. Peripheral IV started with good return pre and post infusion.  Feraheme  510 mg given today per MD orders. Tolerated infusion without adverse affects. Vital signs stable. No complaints at this time. Discharged from clinic ambulatory with cane in stable condition. Alert and oriented x 3. F/U with [facility] as scheduled.

## 2024-03-11 ENCOUNTER — Other Ambulatory Visit: Payer: Self-pay | Admitting: *Deleted

## 2024-03-11 DIAGNOSIS — K7460 Unspecified cirrhosis of liver: Secondary | ICD-10-CM

## 2024-03-11 DIAGNOSIS — D509 Iron deficiency anemia, unspecified: Secondary | ICD-10-CM

## 2024-03-20 ENCOUNTER — Other Ambulatory Visit (HOSPITAL_COMMUNITY)
Admission: RE | Admit: 2024-03-20 | Discharge: 2024-03-20 | Disposition: A | Discharge status: Home / Self Care | Source: Ambulatory Visit | Attending: Gastroenterology | Admitting: Gastroenterology

## 2024-03-20 DIAGNOSIS — K7460 Unspecified cirrhosis of liver: Secondary | ICD-10-CM | POA: Insufficient documentation

## 2024-03-20 DIAGNOSIS — D509 Iron deficiency anemia, unspecified: Secondary | ICD-10-CM | POA: Insufficient documentation

## 2024-03-20 LAB — COMPREHENSIVE METABOLIC PANEL WITH GFR
ALT: 30 U/L (ref 0–44)
AST: 29 U/L (ref 15–41)
Albumin: 3.4 g/dL — ABNORMAL LOW (ref 3.5–5.0)
Alkaline Phosphatase: 95 U/L (ref 38–126)
Anion gap: 9 (ref 5–15)
BUN: 16 mg/dL (ref 8–23)
CO2: 31 mmol/L (ref 22–32)
Calcium: 9.4 mg/dL (ref 8.9–10.3)
Chloride: 99 mmol/L (ref 98–111)
Creatinine, Ser: 0.88 mg/dL (ref 0.44–1.00)
GFR, Estimated: >60 mL/min (ref >60)
Glucose, Bld: 390 mg/dL — ABNORMAL HIGH (ref 70–99)
Potassium: 4 mmol/L (ref 3.5–5.1)
Sodium: 139 mmol/L (ref 135–145)
Total Bilirubin: 1.4 mg/dL — ABNORMAL HIGH (ref 0.0–1.2)
Total Protein: 6.4 g/dL — ABNORMAL LOW (ref 6.5–8.1)

## 2024-03-20 LAB — PROTIME-INR
INR: 1.2 (ref 0.8–1.2)
Prothrombin Time: 15.4 s — ABNORMAL HIGH (ref 11.4–15.2)

## 2024-03-20 LAB — CBC WITH DIFFERENTIAL/PLATELET
Abs Immature Granulocytes: 0.01 10*3/uL (ref 0.00–0.07)
Basophils Absolute: 0 10*3/uL (ref 0.0–0.1)
Basophils Relative: 0 %
Eosinophils Absolute: 0.1 10*3/uL (ref 0.0–0.5)
Eosinophils Relative: 3 %
HCT: 40.6 % (ref 36.0–46.0)
Hemoglobin: 13.9 g/dL (ref 12.0–15.0)
Immature Granulocytes: 0 %
Lymphocytes Relative: 18 %
Lymphs Abs: 0.8 10*3/uL (ref 0.7–4.0)
MCH: 35.9 pg — ABNORMAL HIGH (ref 26.0–34.0)
MCHC: 34.2 g/dL (ref 30.0–36.0)
MCV: 104.9 fL — ABNORMAL HIGH (ref 80.0–100.0)
Monocytes Absolute: 0.3 10*3/uL (ref 0.1–1.0)
Monocytes Relative: 7 %
Neutro Abs: 3.4 10*3/uL (ref 1.7–7.7)
Neutrophils Relative %: 72 %
Platelets: 81 10*3/uL — ABNORMAL LOW (ref 150–400)
RBC: 3.87 MIL/uL (ref 3.87–5.11)
RDW: 14.7 % (ref 11.5–15.5)
WBC: 4.7 10*3/uL (ref 4.0–10.5)
nRBC: 0 % (ref 0.0–0.2)

## 2024-03-21 ENCOUNTER — Ambulatory Visit: Payer: Self-pay | Admitting: Gastroenterology

## 2024-03-21 LAB — AFP TUMOR MARKER: AFP, Serum, Tumor Marker: 4.8 ng/mL (ref 0.0–9.2)

## 2024-03-26 ENCOUNTER — Other Ambulatory Visit (HOSPITAL_COMMUNITY): Payer: Self-pay | Admitting: Internal Medicine

## 2024-03-26 DIAGNOSIS — Z1231 Encounter for screening mammogram for malignant neoplasm of breast: Secondary | ICD-10-CM

## 2024-04-08 NOTE — Progress Notes (Unsigned)
 GI Office Note    Referring Provider: Rosamond Leta NOVAK, MD Primary Care Physician:  Rosamond Leta NOVAK, MD Primary Gastroenterologist: Carlin POUR. Cindie, DO  Date:  04/10/2024  ID:  Rollene CINDERELLA Seip, DOB Nov 28, 1945, MRN [id number]  Chief Complaint   Chief Complaint  Patient presents with   Follow-up    Follow up. No problems    History of Present Illness  JAYLEA PLOURDE is a 78 y.o. female with a history of Atrial flutter/fib s/p ablation 2022, HTN, HLD, diabetes, COPD, anxiety, arthritis, sleep apnea, GERD, and anemia presenting today for cirrhosis follow up with no specific complaints.   Previous cirrhosis workup with normal immunoglobulins, ANA, AMA, Alpha-1-antitrypsin, AFP. Celiac screen negative. Hep B core antibody non-reactive.    Colonoscopy June 2023 -internal hemorrhoids -sigmoid diverticulosis -two transverse polyps -two rectosigmoid polyps -biopsy revealed as tubular adenomas -Repeat in 5 years  EGD June 2023: - Gastritis. Biopsied.  - Normal duodenal bulb, first and second portion of the duodenum. - Repeat EGD in 3 years  Last office visit 04/05/23. Reported some LUQ pain along the rib cage that lasts several hours. No N/V, melena, jaundice, brbpr. Has daily BM.Advised famotidine  20 mg daily. US  and labs ordered. 2g sodium diet advised. High protein and low fat diet. Oral iron every other day.      Latest Ref Rng & Units 03/20/2024    2:54 PM 02/07/2024   12:52 PM 09/17/2023    8:47 AM  CBC  WBC 4.0 - 10.5 K/uL 4.7  5.8  3.6   Hemoglobin 12.0 - 15.0 g/dL 86.0  87.2  86.9   Hematocrit 36.0 - 46.0 % 40.6  39.8  40.4   Platelets 150 - 400 K/uL 81  105  85       Latest Ref Rng & Units 03/20/2024    2:54 PM 02/07/2024   12:52 PM 09/17/2023    8:47 AM  CMP  Glucose 70 - 99 mg/dL 609  784  810   BUN 8 - 23 mg/dL 16  13  15    Creatinine 0.44 - 1.00 mg/dL 9.11  9.19  9.19   Sodium 135 - 145 mmol/L 139  137  139   Potassium 3.5 - 5.1 mmol/L 4.0  4.2  3.8   Chloride  98 - 111 mmol/L 99  100  104   CO2 22 - 32 mmol/L 31  30  30    Calcium  8.9 - 10.3 mg/dL 9.4  9.3  9.1   Total Protein 6.5 - 8.1 g/dL 6.4  6.6  6.1   Total Bilirubin 0.0 - 1.2 mg/dL 1.4  1.5  1.5   Alkaline Phos 38 - 126 U/L 95  89  90   AST 15 - 41 U/L 29  30  36   ALT 0 - 44 U/L 30  27  28      Iron/TIBC/Ferritin/ %Sat    Component Value Date/Time   IRON 73 02/07/2024 1252   TIBC 357 02/07/2024 1252   FERRITIN 37 02/07/2024 1252   IRONPCTSAT 21 02/07/2024 1252   IRONPCTSAT 12 07/13/2017 1539    Today:  Cirrhosis history Hematemesis/coffee ground emesis: none History of variceal bleeding: none Abdominal pain: none Abdominal distention/worsening ascites: sitting for long periods has some peripherla swelling.  Fever/chills: none Episodes of confusion/disorientation: none, reports feeling forgetful.  Number of daily bowel movements: daily - small amounts.  Taking diuretics?: none  Date of last EGD: June 2023 - no varices Prior history of  banding?: no Prior episodes of SBP: none Last time liver imaging was performed:02/19/24 - CBD 6.66mm, no hepatoma. Patent portal vein   MELD 3.0: 11 at 03/20/2024  2:54 PM MELD-Na: 10 at 03/20/2024  2:54 PM Calculated from: Serum Creatinine: 0.88 mg/dL (Using min of 1 mg/dL) at 3/73/7974  7:45 PM Serum Sodium: 139 mmol/L (Using max of 137 mmol/L) at 03/20/2024  2:54 PM Total Bilirubin: 1.4 mg/dL at 3/73/7974  7:45 PM Serum Albumin: 3.4 g/dL at 3/73/7974  7:45 PM INR(ratio): 1.2 at 03/20/2024  2:54 PM Age at listing (hypothetical): 2 years Sex: Female at 03/20/2024  2:54 PM  GERD: Comes and goes but medications definitely can cause some symptoms on an empty stomach. Takes pills with water. Has given up sodas and coffee and tobacco. Has not been tacking pantoprazole    Constipation: having incomplete emptying. Sometimes having bristol 1 type stools but rarely has to wipe to feel clean. Sometimes has to strain. No brbpr or melena. Still going  daily.   Reports some forgetfulness - needs to write things down and does not recall certain events at times. At times has trouble remembering where places are. Has not gotten   Does have fatigue - shortly after waking she eats, takes medication, and watches new and can go to sleep. Still taking her iron every other day. She us  taking melatonin at night as well and takes 10 mg.   A1c was 7.2 last check - has been cheating her diet recently.   Wt Readings from Last 5 Encounters:  04/10/24 223 lb 12.8 oz (101.5 kg)  02/12/24 226 lb 1.6 oz (102.6 kg)  10/05/23 228 lb (103.4 kg)  08/14/23 226 lb 10.1 oz (102.8 kg)  04/05/23 222 lb (100.7 kg)    Current Outpatient Medications  Medication Sig Dispense Refill   acetaminophen  (TYLENOL ) 500 MG tablet Take 1,000 mg by mouth every 6 (six) hours as needed for moderate pain or headache.     ALPHA LIPOIC ACID PO Take 1 capsule by mouth daily.     apixaban  (ELIQUIS ) 5 MG TABS tablet Take 1 tablet (5 mg total) by mouth 2 (two) times daily. 180 tablet 3   Ascorbic Acid (VITAMIN C) 1000 MG tablet Take 1,000 mg by mouth daily.     busPIRone (BUSPAR) 5 MG tablet Take 5 mg by mouth 2 (two) times daily.     Calcium  Carbonate-Vit D-Min (CALCIUM  600+D3 PLUS MINERALS) 600-800 MG-UNIT TABS Take 1 tablet by mouth daily.     cetirizine  (ZYRTEC ) 10 MG tablet Take 10 mg by mouth daily.     cholecalciferol (VITAMIN D3) 25 MCG (1000 UNIT) tablet Take 1,000 Units by mouth daily.     CINNAMON PO Take 2,400 mg by mouth at bedtime.     DULoxetine  (CYMBALTA ) 60 MG capsule Take 60 mg by mouth daily.     ferrous sulfate 325 (65 FE) MG EC tablet Take 325 mg by mouth every other day.     fluticasone-salmeterol (WIXELA INHUB) 250-50 MCG/ACT AEPB Inhale 1 puff into the lungs in the morning and at bedtime.     FREESTYLE LITE test strip      glipiZIDE (GLUCOTROL) 5 MG tablet Take 5 mg by mouth daily.     Melatonin 10 MG TABS Take 10 mg by mouth at bedtime.     metFORMIN   (GLUCOPHAGE ) 500 MG tablet Take 500 mg by mouth daily.     metoprolol  succinate (TOPROL -XL) 25 MG 24 hr tablet TAKE 1 TABLET DAILY (DOSE  DECREASED 03/25/21) 90 tablet 3   Multiple Vitamin (MULTIVITAMIN WITH MINERALS) TABS tablet Take 1 tablet by mouth daily.     NON FORMULARY B Pap Machine     OXYGEN  Inhale 2 L into the lungs at bedtime. With BPAP     pantoprazole  (PROTONIX ) 40 MG tablet SMARTSIG:1 Tablet(s) By Mouth Every Evening     pravastatin  (PRAVACHOL ) 20 MG tablet Take 1 tablet (20 mg total) daily by mouth. 90 tablet 3   PROAIR  HFA 108 (90 Base) MCG/ACT inhaler Inhale 2 puffs into the lungs every 4 (four) hours as needed for wheezing or shortness of breath.     Probiotic Product (PROBIOTIC DAILY PO) Take 1 capsule by mouth daily.      Red Yeast Rice 600 MG TABS Take 1,200 mg by mouth daily.     triamcinolone cream (KENALOG) 0.1 % Apply topically.     Zinc 30 MG CAPS Take 30 mg by mouth daily.     No current facility-administered medications for this visit.    Past Medical History:  Diagnosis Date   Allergy    Anemia 07/25/2017   Anxiety    Arthritis    Atrial fibrillation [facility])    s/p ablation in March 2022, ILP remains in place   Cataract    COPD (chronic obstructive pulmonary disease) (HCC)    Depression    Diabetes mellitus without complication (HCC)    Emphysema of lung (HCC)    GERD (gastroesophageal reflux disease) 10/28/2020   Hyperlipidemia    Hypertension    Oxygen  deficiency    Sleep apnea    Typical atrial flutter (HCC)     Past Surgical History:  Procedure Laterality Date   A-FLUTTER ABLATION N/A 10/04/2017   Procedure: A-FLUTTER ABLATION;  Surgeon: Kelsie Agent, MD;  [facility];  Service: Cardiovascular;  Laterality: N/A;   ABDOMINAL HYSTERECTOMY     fibroids   ATRIAL FIBRILLATION ABLATION N/A 12/09/2020   Procedure: ATRIAL FIBRILLATION ABLATION;  Surgeon: Kelsie Agent, MD;  Location: [facility];  Service: Cardiovascular;   Laterality: N/A;   BIOPSY  03/20/2022   Procedure: BIOPSY;  Surgeon: Cindie Carlin POUR, DO;  Location: [facility];  Service: Endoscopy;;   blood clot removal from base of brain  1990   CARDIOVERSION N/A 10/04/2020   Procedure: CARDIOVERSION;  Surgeon: Hobart Powell BRAVO, MD;  Location: [facility] ENDOSCOPY;  Service: Cardiovascular;  Laterality: N/A;   CARPAL TUNNEL RELEASE     CATARACT EXTRACTION W/PHACO Left 06/16/2013   Procedure: CATARACT EXTRACTION PHACO AND INTRAOCULAR LENS PLACEMENT (IOC);  Surgeon: Cherene Mania, MD;  Location: [facility] ORS;  Service: Ophthalmology;  Laterality: Left;  CDE:  12.18   CATARACT EXTRACTION W/PHACO Right 06/26/2013   Procedure: CATARACT EXTRACTION PHACO AND INTRAOCULAR LENS PLACEMENT (IOC);  Surgeon: Cherene Mania, MD;  Location: [facility];  Service: Ophthalmology;  Laterality: Right;  CDE:14.71   CESAREAN SECTION     CHOLECYSTECTOMY     COLONOSCOPY WITH PROPOFOL  N/A 04/08/2019   Dr.Fields: diverticulosis, 11 simple adenomas removed, hemorrhoids. next colonoscopy in 3 years.    COLONOSCOPY WITH PROPOFOL  N/A 03/20/2022   Procedure: COLONOSCOPY WITH PROPOFOL ;  Surgeon: Cindie Carlin POUR, DO;  [facility];  Service: Endoscopy;  Laterality: N/A;  9:45am   DILATION AND CURETTAGE OF UTERUS     ESOPHAGOGASTRODUODENOSCOPY (EGD) WITH PROPOFOL  N/A 04/08/2019   Dr. Harvey: H.pylori gastritis   ESOPHAGOGASTRODUODENOSCOPY (EGD) WITH PROPOFOL  N/A 03/20/2022   Procedure: ESOPHAGOGASTRODUODENOSCOPY (EGD) WITH PROPOFOL ;  Surgeon: Cindie Carlin POUR, DO;  [facility];  Service: Endoscopy;  Laterality: N/A;   EYE SURGERY  12/2016   FOOT NEUROMA SURGERY Right    LOOP RECORDER INSERTION N/A 11/06/2017   Procedure: LOOP RECORDER INSERTION;  Surgeon: Kelsie Agent, MD;  [facility];  Service: Cardiovascular;  Laterality: N/A;   MOUTH SURGERY     POLYPECTOMY  04/08/2019   Procedure: POLYPECTOMY;  Surgeon: Harvey Margo CROME, MD;  [facility];  Service:  Endoscopy;;   POLYPECTOMY  03/20/2022   Procedure: POLYPECTOMY;  Surgeon: Cindie Carlin POUR, DO;  [facility];  Service: Endoscopy;;   TEE WITHOUT CARDIOVERSION N/A 12/09/2020   Procedure: TRANSESOPHAGEAL ECHOCARDIOGRAM (TEE);  Surgeon: Pietro Redell RAMAN, MD;  Location: [facility] ENDOSCOPY;  Service: Cardiovascular;  Laterality: N/A;   TONSILLECTOMY      Family History  Problem Relation Age of Onset   Emphysema Mother    Alcohol  abuse Mother    Arthritis Mother    COPD Mother    Depression Mother    Hyperlipidemia Mother    Hypertension Mother    Heart attack Father    Cancer Father        lung   Heart disease Father    Diabetes Brother    COPD Brother    Other Maternal Grandmother        swine flu 1919   Colon cancer Neg Hx    Colon polyps Neg Hx    Liver disease Neg Hx     Allergies as of 04/10/2024 - Review Complete 04/10/2024  Allergen Reaction Noted   Trazodone and nefazodone Hives and Other (See Comments) 01/10/2017   Benicar [olmesartan]  11/30/2020   Nickel Other (See Comments) 10/04/2017   Tetanus toxoids Other (See Comments) 06/28/2018   Adhesive [tape] Itching 06/09/2013   Bactrim [sulfamethoxazole-trimethoprim] Itching and Rash 06/09/2013   Penicillins Itching, Rash, and Other (See Comments) 06/09/2013   Ppd [tuberculin purified protein derivative] Swelling and Other (See Comments) 06/09/2013    Social History   Socioeconomic History   Marital status: Widowed    Spouse name: Not on file   Number of children: 1   Years of education: 72   Highest education level: Not on file  Occupational History   Occupation: retired    Comment: catering, Production manager  Tobacco Use   Smoking status: Former    Current packs/day: 0.00    Average packs/day: 1.5 packs/day for 42.7 years (64.0 ttl pk-yrs)    Types: Cigarettes    Start date: 09/25/1958    Quit date: 06/06/2001    Years since quitting: 22.8   Smokeless tobacco: Never  Vaping Use   Vaping status: Never Used   Substance and Sexual Activity   Alcohol  use: Not Currently    Comment: used to drink an occasional alcoholic beverage. No hyistory of heavy alcohol  use.   Drug use: Never   Sexual activity: Not Currently  Other Topics Concern   Not on file  Social History Narrative   Lives alone   Son lives in [place]   TV, reads, puzzles   Social Drivers of Health   Financial Resource Strain: Low Risk  (09/29/2020)   Received [facility]   Overall Financial Resource Strain (CARDIA)    Difficulty of Paying Living Expenses: Not hard at all  Food Insecurity: No Food Insecurity (09/29/2020)   Received from [facility]   Hunger Vital Sign    Within  the past 12 months, you worried that your food would run out before you got the money to buy more.: Never true    Within the past 12 months, the food you bought just didn't last and you didn't have money to get more.: Never true  Transportation Needs: No Transportation Needs (09/29/2020)   Received from [facility]    Lack of Transportation (Medical): No    Lack of Transportation (Non-Medical): No  Physical Activity: Not on file  Stress: Not on file  Social Connections: Not on file     Review of Systems   Gen: + fatigue. Denies fever, chills, anorexia. Denies weakness, weight loss.  CV: Denies chest pain, palpitations, syncope, peripheral edema, and claudication. Resp: Denies dyspnea at rest, cough, wheezing, coughing up blood, and pleurisy. GI: See HPI Derm: Denies rash, itching, dry skin Psych: +forgetfulness. Denies depression, anxiety, confusion. No homicidal or suicidal ideation.  Heme: Denies bruising, bleeding, and enlarged lymph nodes.  Physical Exam   BP 126/72 (BP Location: Right Arm, Patient Position: Sitting, Cuff Size: Large)   Pulse 78   Temp (!) 96.9 F (36.1 C) (Temporal)   Ht 5' 2 (1.575 m)   Wt 223 lb 12.8 oz (101.5 kg)   BMI 40.93 kg/m   General:   Alert and oriented. No  distress noted. Pleasant and cooperative.  Head:  Normocephalic and atraumatic. Eyes:  Conjuctiva clear without scleral icterus. Mouth:  Oral mucosa pink and moist. Good dentition. No lesions. Abdomen:  +BS, soft, non-tender and non-distended. No rebound or guarding. No HSM or masses noted. Rectal: deferred Msk:  Symmetrical without gross deformities. Normal posture. Extremities:  Without edema. Neurologic:  Alert and  oriented x4 Psych:  Alert and cooperative. Normal mood and affect.  Assessment  ICHELLE HARRAL is a 78 y.o. female presenting today for cirrhosis follow up.   Cirrhosis: MELD 3.0 score stable at 11.  She continues to have evidence of thrombocytopenia with platelets ranging 80-105.  Normal LFTs.  Sodium level stable.  No signs or symptoms of any ascites.  Occasionally has peripheral edema but more so with venous stasis (prolonged sitting) and improves overnight with elevation.  Denies any jaundice, pruritus, coffee-ground emesis, hematemesis, or any other bleeding episodes.  No overt hepatic encephalopathy although has been having some forgetfulness with forgetting dates and locations of places but not getting lost with driving.  Recent ultrasound in June with no evidence of hepatoma.  Will continue 33-month surveillance.  IDA: Maintained on oral iron every other day.  Most recent labs in May and June with stable hemoglobin and iron levels.  Has received iron infusions in the past as well.  Continues to experience some fatigue but this may be more related to melatonin use and/or symptoms of cirrhosis.  Continues to take her B12 supplement.  GERD: Pantoprazole  remains on her med list however she is unsure if she is taking it.  Has rare symptoms and will take Tums as needed for relief.  Denies any nausea, vomiting, dysphagia, hematemesis.  Constipation: Has been struggling with Bristol 1 stools as of late and needing to strain at times.  Also has feeling of incomplete emptying.  Not  taking any over-the-counter supplements at this time and no extra fiber.  Given she has cirrhosis and some concerned about forgetfulness and fatigue as well we will trial lactulose  once daily for goal of more improvement in stool consistency and to assist with complete evacuation.  If  she tolerates and still is having some incomplete emptying we will add fiber supplementation.  PLAN    Lactulose  10g once daily. - call with progress report in 3 weeks - will [facility] term [facility] if effective.  Labs December 2025 - CBC, CMP, INR RUQ US  in December Continue pantoprazole  if she is taking it.  Tums as needed  Continue oral Iron very other day High protein diet.  Follow up in 6 months   Charmaine Melia, MSN, FNP-BC, AGACNP-BC [facility]

## 2024-04-10 ENCOUNTER — Encounter: Payer: Self-pay | Admitting: Gastroenterology

## 2024-04-10 ENCOUNTER — Ambulatory Visit (INDEPENDENT_AMBULATORY_CARE_PROVIDER_SITE_OTHER): Admitting: Gastroenterology

## 2024-04-10 VITALS — BP 126/72 | HR 78 | Temp 96.9°F | Ht 62.0 in | Wt 223.8 lb

## 2024-04-10 DIAGNOSIS — K5900 Constipation, unspecified: Secondary | ICD-10-CM | POA: Diagnosis not present

## 2024-04-10 DIAGNOSIS — D696 Thrombocytopenia, unspecified: Secondary | ICD-10-CM

## 2024-04-10 DIAGNOSIS — D509 Iron deficiency anemia, unspecified: Secondary | ICD-10-CM

## 2024-04-10 DIAGNOSIS — K219 Gastro-esophageal reflux disease without esophagitis: Secondary | ICD-10-CM | POA: Diagnosis not present

## 2024-04-10 DIAGNOSIS — K7460 Unspecified cirrhosis of liver: Secondary | ICD-10-CM

## 2024-04-10 MED ORDER — LACTULOSE 10 GM/15ML PO SOLN: 10.0000 g | Freq: Every day | ORAL | 0 refills | Status: DC

## 2024-04-10 NOTE — Patient Instructions (Addendum)
 I have sent in lactulose  to help you with constipation.  You will take 10 g (15 mL) once a day.  The first few days that you take it I want you to make sure you do not have somewhere to be so you know how long it takes effect.  My goal for you is to have a normal solid formed bowel movement daily without needing to strain.  Please let me know if this helps you with your bowel movements if so I will make sure I send in long-term prescription to [facility] for you.  We will send you a letter and let you know when labs and ultrasound are due, you will be due in December and we will send you a message before this.  Please call let me know after you get home if you are taking pantoprazole .  If not I will remove this from your med list.  For reflux you can continue Tums as needed.  Continue cirrhosis Lifestyle Recommendations:  High-protein diet from a primarily plant-based diet. Avoid red meat.  No raw or undercooked meat, seafood, or shellfish. Low-fat/cholesterol/carbohydrate diet. Limit sodium to no more than 2000 mg/day including everything that you eat and drink. Recommend at least 30 minutes of aerobic and resistance exercise 3 days/week. Limit Tylenol  to 2000 mg daily.   We will follow-up in the office in 6 months or sooner if needed.  I also wanted to remind you that you should start taking your melatonin about an 1 and 1/2 to 2 hours prior to when you are going to lay down that way by the time you go to lay down you are feeling sleepy.  It was a pleasure to see you today. I want to create trusting relationships with patients. If you receive a survey regarding your visit,  I greatly appreciate you taking time to fill this out on paper or through your MyChart. I value your feedback.  Charmaine Melia, MSN, FNP-BC, AGACNP-BC [facility]

## 2024-04-30 ENCOUNTER — Ambulatory Visit (HOSPITAL_COMMUNITY)
Admission: RE | Admit: 2024-04-30 | Discharge: 2024-04-30 | Disposition: A | Discharge status: Home / Self Care | Source: Ambulatory Visit | Attending: Internal Medicine | Admitting: Internal Medicine

## 2024-04-30 ENCOUNTER — Encounter (HOSPITAL_COMMUNITY): Payer: Self-pay

## 2024-04-30 DIAGNOSIS — Z1231 Encounter for screening mammogram for malignant neoplasm of breast: Secondary | ICD-10-CM | POA: Insufficient documentation

## 2024-06-30 ENCOUNTER — Encounter: Payer: Self-pay | Admitting: Oncology

## 2024-07-04 ENCOUNTER — Encounter: Payer: Self-pay | Admitting: Gastroenterology

## 2024-07-23 ENCOUNTER — Telehealth: Payer: Self-pay | Admitting: Gastroenterology

## 2024-07-23 ENCOUNTER — Encounter: Payer: Self-pay | Admitting: Oncology

## 2024-07-23 ENCOUNTER — Other Ambulatory Visit: Payer: Self-pay | Admitting: *Deleted

## 2024-07-23 DIAGNOSIS — K7460 Unspecified cirrhosis of liver: Secondary | ICD-10-CM

## 2024-07-23 NOTE — Telephone Encounter (Signed)
 Pt informed that US  is scheduled for Wednesday 07/30/24, arrive at 8:15 am, NPO after midnight.

## 2024-07-23 NOTE — Telephone Encounter (Signed)
 Patient left a message that she received a letter to schedule an ultrasound...  if someone could call her back.  [phone]

## 2024-07-29 ENCOUNTER — Encounter: Payer: Self-pay | Admitting: Oncology

## 2024-07-30 ENCOUNTER — Ambulatory Visit (HOSPITAL_COMMUNITY): Attending: Gastroenterology

## 2024-08-05 ENCOUNTER — Inpatient Hospital Stay: Attending: Physician Assistant

## 2024-08-05 DIAGNOSIS — D696 Thrombocytopenia, unspecified: Secondary | ICD-10-CM

## 2024-08-05 DIAGNOSIS — D500 Iron deficiency anemia secondary to blood loss (chronic): Secondary | ICD-10-CM

## 2024-08-05 LAB — CBC WITH DIFFERENTIAL/PLATELET
Abs Immature Granulocytes: 0.01 K/uL (ref 0.00–0.07)
Basophils Absolute: 0 K/uL (ref 0.0–0.1)
Basophils Relative: 0 %
Eosinophils Absolute: 0.1 K/uL (ref 0.0–0.5)
Eosinophils Relative: 2 %
HCT: 43.9 % (ref 36.0–46.0)
Hemoglobin: 14.1 g/dL (ref 12.0–15.0)
Immature Granulocytes: 0 %
Lymphocytes Relative: 19 %
Lymphs Abs: 1.1 K/uL (ref 0.7–4.0)
MCH: 32.9 pg (ref 26.0–34.0)
MCHC: 32.1 g/dL (ref 30.0–36.0)
MCV: 102.6 fL — ABNORMAL HIGH (ref 80.0–100.0)
Monocytes Absolute: 0.4 K/uL (ref 0.1–1.0)
Monocytes Relative: 7 %
Neutro Abs: 4.3 K/uL (ref 1.7–7.7)
Neutrophils Relative %: 72 %
Platelets: 102 K/uL — ABNORMAL LOW (ref 150–400)
RBC: 4.28 MIL/uL (ref 3.87–5.11)
RDW: 14 % (ref 11.5–15.5)
WBC: 6 K/uL (ref 4.0–10.5)
nRBC: 0 % (ref 0.0–0.2)

## 2024-08-05 LAB — FERRITIN: Ferritin: 98 ng/mL (ref 11–307)

## 2024-08-05 LAB — IRON AND TIBC
Iron: 123 ug/dL (ref 28–170)
Saturation Ratios: 36 % — ABNORMAL HIGH (ref 10.4–31.8)
TIBC: 344 ug/dL (ref 250–450)
UIBC: 221 ug/dL

## 2024-08-11 NOTE — Progress Notes (Unsigned)
 VIRTUAL VISIT via TELEPHONE NOTE [facility]   I connected with Linda [name]  on 08/12/24 at  3:30 PM by telephone and verified that I am speaking with the correct person using two identifiers.  Location: Patient: Home Provider: Home office   I discussed the limitations, risks, security and privacy concerns of performing an evaluation and management service by telephone and the availability of in person appointments. I also discussed with the patient that there may be a patient responsible charge related to this service. The patient expressed understanding and agreed to proceed.  REASON FOR VISIT:  Follow-up for iron deficiency anemia and thrombocytopenia   CURRENT THERAPY: Intermittent IV iron  INTERVAL HISTORY:   Linda [name] 78 y.o. female returns for routine follow-up of iron deficiency anemia and thrombocytopenia.   She was last seen by Pleasant Barefoot PA-C on 02/12/2024. Most recent IV iron with Feraheme  x 2 in May 2025. She reports feeling improved energy after IV iron in May 2025.   Energy today is good. She denies any bright red blood per rectum, melena, or epistaxis. She continues to bruise easily but denies any petechial rash. She is on Eliquis  for atrial fibrillation.    She has headaches and lightheadedness.   She denies any chest pain, or syncope. She has chronic shortness of breath with exertion, which is at baseline.  No B symptoms such as fever, chills, night sweats, or unintentional weight loss.   She remains on her iron tablet every other day.  She has 100% energy and 100% appetite.  She endorses that she is maintaining a stable weight.   She enjoys keeping busy, and is taking a crochet class.  ASSESSMENT & PLAN:  1.  Iron deficiency anemia: - Colonoscopy on 04/08/2019 with normal ileum, moderate diverticulosis in the rectosigmoid colon, external and internal hemorrhoids.  Pathology consistent with tubular adenomas. - EGD (03/20/2022):  Gastritis.  No esophageal varices. - Colonoscopy (03/20/2022): Nonbleeding internal hemorrhoids, diverticulosis, polyps x4 - Likely secondary to malabsorption, patient is on oral iron without improvement - Prior labs have shown normal creatinine. - Most recent IV iron with Feraheme  x 2 in December 2024 - She is taking iron tablet every other day - Patient denies bright red blood per rectum or melena    - Most recent labs (08/05/2024): Hgb 14.1/MCV 102.6 (macrocytosis in the setting of liver cirrhosis).  Ferritin 98 iron saturation 36% - PLAN: No indication for IV iron at this time.  Continue iron tablet every other day. - CBC and iron panel with RTC in 6 months, or sooner if needed based on symptoms  - Continue follow-up with gastroenterology   2.  Thrombocytopenia - Mild thrombocytopenia since 2018 - CBC on 09/23/2020 did note some platelet clumping could be skewing the counts - CT scan of the chest on 01/05/2017 at [facility] showed borderline splenomegaly.  No comment on the liver. - Abdominal ultrasound (08/04/2021): Heterogeneous liver with slightly nodular contour suspicious for either diffuse hepatocellular disease or cirrhosis; mild splenomegaly 575 cc. - She follows with [facility] for cirrhosis - She admits to easy bruising but denies any petechial rash or abnormal bleeding events. 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  May also have some element of ITP.  Less likely would be early myelodysplasia. - PLAN: Continue surveillance - If any significant deviation from baseline, would consider bone marrow biopsy.  3.  Vitamin B12 deficiency - Previously taking vitamin B12 supplements, but these were stopped at last visit due to vitamin B12 >2000.  (Normal folate when checked in May 2023).  However, PCP restarted her B12 supplement. - PLAN: Defer  management of B12 deficiency to PCP  3.  Atrial fibrillation: - Continue Eliquis  twice daily.    5.  Pulmonary nodule - Patient is following with PCP (Dr. Rosamond)  PLAN SUMMARY: >> Labs in 6 months = CBC/D, ferritin, iron/TIBC >> OFFICE visit in 6 months, 1 week after labs     REVIEW OF SYSTEMS:   Review of Systems  Constitutional:  Negative for chills, diaphoresis, fever, malaise/fatigue and weight loss.  Respiratory:  Negative for cough and shortness of breath.   Cardiovascular:  Negative for chest pain and palpitations.  Gastrointestinal:  Negative for abdominal pain, blood in stool, melena, nausea and vomiting.  Neurological:  Positive for dizziness and headaches.     PHYSICAL EXAM: (per limitations of virtual telephone visit)  The patient is alert and oriented x 3, exhibiting adequate mentation, good mood, and ability to speak in full sentences and execute sound judgement.  WRAP UP:   I discussed the assessment and treatment plan with the patient. The patient was provided an opportunity to ask questions and all were answered. The patient agreed with the plan and demonstrated an understanding of the instructions.   The patient was advised to call back or seek an in-person evaluation if the symptoms worsen or if the condition fails to improve as anticipated.  I provided 13 minutes of non-face-to-face time during this encounter, including >10 minutes of medical discussion.  Pleasant CHRISTELLA Barefoot, PA-C 08/12/24 3:43 PM

## 2024-08-12 ENCOUNTER — Inpatient Hospital Stay (HOSPITAL_BASED_OUTPATIENT_CLINIC_OR_DEPARTMENT_OTHER): Admitting: Physician Assistant

## 2024-08-12 DIAGNOSIS — D696 Thrombocytopenia, unspecified: Secondary | ICD-10-CM | POA: Diagnosis not present

## 2024-08-12 DIAGNOSIS — D509 Iron deficiency anemia, unspecified: Secondary | ICD-10-CM

## 2024-08-12 DIAGNOSIS — E538 Deficiency of other specified B group vitamins: Secondary | ICD-10-CM

## 2024-08-12 DIAGNOSIS — R911 Solitary pulmonary nodule: Secondary | ICD-10-CM

## 2024-08-12 DIAGNOSIS — D500 Iron deficiency anemia secondary to blood loss (chronic): Secondary | ICD-10-CM

## 2024-08-12 DIAGNOSIS — I4891 Unspecified atrial fibrillation: Secondary | ICD-10-CM | POA: Diagnosis not present

## 2024-09-03 ENCOUNTER — Encounter: Payer: Self-pay | Admitting: Gastroenterology

## 2024-09-04 ENCOUNTER — Other Ambulatory Visit: Payer: Self-pay | Admitting: *Deleted

## 2024-09-04 DIAGNOSIS — D509 Iron deficiency anemia, unspecified: Secondary | ICD-10-CM

## 2024-09-04 DIAGNOSIS — K7460 Unspecified cirrhosis of liver: Secondary | ICD-10-CM

## 2024-09-22 ENCOUNTER — Encounter: Payer: Self-pay | Admitting: *Deleted

## 2024-09-29 ENCOUNTER — Other Ambulatory Visit (HOSPITAL_COMMUNITY)
Admission: RE | Admit: 2024-09-29 | Discharge: 2024-09-29 | Disposition: A | Discharge status: Home / Self Care | Source: Ambulatory Visit | Attending: Gastroenterology | Admitting: Gastroenterology

## 2024-09-29 DIAGNOSIS — K7460 Unspecified cirrhosis of liver: Secondary | ICD-10-CM | POA: Diagnosis present

## 2024-09-29 DIAGNOSIS — D509 Iron deficiency anemia, unspecified: Secondary | ICD-10-CM | POA: Diagnosis not present

## 2024-09-29 LAB — CBC WITH DIFFERENTIAL/PLATELET
Abs Immature Granulocytes: 0.01 K/uL (ref 0.00–0.07)
Basophils Absolute: 0 K/uL (ref 0.0–0.1)
Basophils Relative: 1 %
Eosinophils Absolute: 0.1 K/uL (ref 0.0–0.5)
Eosinophils Relative: 2 %
HCT: 41.2 % (ref 36.0–46.0)
Hemoglobin: 13.3 g/dL (ref 12.0–15.0)
Immature Granulocytes: 0 %
Lymphocytes Relative: 15 %
Lymphs Abs: 0.8 K/uL (ref 0.7–4.0)
MCH: 33.1 pg (ref 26.0–34.0)
MCHC: 32.3 g/dL (ref 30.0–36.0)
MCV: 102.5 fL — ABNORMAL HIGH (ref 80.0–100.0)
Monocytes Absolute: 0.3 K/uL (ref 0.1–1.0)
Monocytes Relative: 5 %
Neutro Abs: 3.9 K/uL (ref 1.7–7.7)
Neutrophils Relative %: 77 %
Platelets: 101 K/uL — ABNORMAL LOW (ref 150–400)
RBC: 4.02 MIL/uL (ref 3.87–5.11)
RDW: 14.2 % (ref 11.5–15.5)
WBC: 5.1 K/uL (ref 4.0–10.5)
nRBC: 0 % (ref 0.0–0.2)

## 2024-09-29 LAB — COMPREHENSIVE METABOLIC PANEL WITH GFR
ALT: 22 U/L (ref 0–44)
AST: 33 U/L (ref 15–41)
Albumin: 3.7 g/dL (ref 3.5–5.0)
Alkaline Phosphatase: 96 U/L (ref 38–126)
Anion gap: 11 (ref 5–15)
BUN: 19 mg/dL (ref 8–23)
CO2: 26 mmol/L (ref 22–32)
Calcium: 9.6 mg/dL (ref 8.9–10.3)
Chloride: 104 mmol/L (ref 98–111)
Creatinine, Ser: 0.87 mg/dL (ref 0.44–1.00)
GFR, Estimated: >60 mL/min
Glucose, Bld: 154 mg/dL — ABNORMAL HIGH (ref 70–99)
Potassium: 4 mmol/L (ref 3.5–5.1)
Sodium: 141 mmol/L (ref 135–145)
Total Bilirubin: 1.3 mg/dL — ABNORMAL HIGH (ref 0.0–1.2)
Total Protein: 6.5 g/dL (ref 6.5–8.1)

## 2024-09-29 LAB — PROTIME-INR
INR: 1.4 — ABNORMAL HIGH (ref 0.8–1.2)
Prothrombin Time: 17.5 s — ABNORMAL HIGH (ref 11.4–15.2)

## 2024-10-12 ENCOUNTER — Ambulatory Visit: Payer: Self-pay | Admitting: Gastroenterology

## 2024-10-14 NOTE — Addendum Note (Signed)
 Addended by: GAYLENE MADELIN CROME on: 10/14/2024 08:31 AM   Modules accepted: Orders

## 2024-10-21 ENCOUNTER — Other Ambulatory Visit (HOSPITAL_COMMUNITY)

## 2024-10-23 ENCOUNTER — Ambulatory Visit (HOSPITAL_COMMUNITY): Admission: RE | Admit: 2024-10-23 | Source: Ambulatory Visit

## 2024-11-06 ENCOUNTER — Ambulatory Visit: Admitting: Gastroenterology

## 2025-02-09 ENCOUNTER — Inpatient Hospital Stay

## 2025-02-17 ENCOUNTER — Inpatient Hospital Stay: Admitting: Physician Assistant
